# Patient Record
Sex: Female | Born: 1935 | Race: White | Hispanic: No | Smoking: Former smoker
Health system: Southern US, Community
[De-identification: ages and names within clinical notes are randomized; demographics above are authoritative.]

## PROBLEM LIST (undated history)

## (undated) DIAGNOSIS — E0590 Thyrotoxicosis, unspecified without thyrotoxic crisis or storm: Secondary | ICD-10-CM

## (undated) DIAGNOSIS — J45909 Unspecified asthma, uncomplicated: Secondary | ICD-10-CM

## (undated) DIAGNOSIS — I6529 Occlusion and stenosis of unspecified carotid artery: Secondary | ICD-10-CM

## (undated) DIAGNOSIS — I10 Essential (primary) hypertension: Secondary | ICD-10-CM

## (undated) DIAGNOSIS — Z780 Asymptomatic menopausal state: Secondary | ICD-10-CM

## (undated) DIAGNOSIS — M810 Age-related osteoporosis without current pathological fracture: Secondary | ICD-10-CM

## (undated) DIAGNOSIS — I639 Cerebral infarction, unspecified: Secondary | ICD-10-CM

## (undated) DIAGNOSIS — Z973 Presence of spectacles and contact lenses: Secondary | ICD-10-CM

## (undated) DIAGNOSIS — N189 Chronic kidney disease, unspecified: Secondary | ICD-10-CM

## (undated) DIAGNOSIS — E785 Hyperlipidemia, unspecified: Secondary | ICD-10-CM

## (undated) DIAGNOSIS — R519 Headache, unspecified: Secondary | ICD-10-CM

## (undated) DIAGNOSIS — T7840XA Allergy, unspecified, initial encounter: Secondary | ICD-10-CM

## (undated) DIAGNOSIS — J189 Pneumonia, unspecified organism: Secondary | ICD-10-CM

## (undated) DIAGNOSIS — Z974 Presence of external hearing-aid: Secondary | ICD-10-CM

## (undated) DIAGNOSIS — J449 Chronic obstructive pulmonary disease, unspecified: Secondary | ICD-10-CM

## (undated) DIAGNOSIS — M1990 Unspecified osteoarthritis, unspecified site: Secondary | ICD-10-CM

## (undated) DIAGNOSIS — E042 Nontoxic multinodular goiter: Secondary | ICD-10-CM

## (undated) HISTORY — DX: Unspecified asthma, uncomplicated: J45.909

## (undated) HISTORY — PX: WISDOM TOOTH EXTRACTION: SHX21

## (undated) HISTORY — PX: OTHER SURGICAL HISTORY: SHX169

## (undated) HISTORY — DX: Cerebral infarction, unspecified: I63.9

## (undated) HISTORY — DX: Essential (primary) hypertension: I10

## (undated) HISTORY — DX: Allergy, unspecified, initial encounter: T78.40XA

## (undated) HISTORY — PX: BREAST EXCISIONAL BIOPSY: SUR124

## (undated) HISTORY — DX: Asymptomatic menopausal state: Z78.0

## (undated) HISTORY — PX: BACK SURGERY: SHX140

## (undated) HISTORY — PX: CATARACT EXTRACTION W/ INTRAOCULAR LENS  IMPLANT, BILATERAL: SHX1307

## (undated) HISTORY — DX: Age-related osteoporosis without current pathological fracture: M81.0

## (undated) HISTORY — DX: Thyrotoxicosis, unspecified without thyrotoxic crisis or storm: E05.90

## (undated) HISTORY — DX: Hyperlipidemia, unspecified: E78.5

## (undated) HISTORY — DX: Nontoxic multinodular goiter: E04.2

## (undated) HISTORY — PX: NECK SURGERY: SHX720

---

## 1998-03-13 ENCOUNTER — Other Ambulatory Visit: Admission: RE | Admit: 1998-03-13 | Discharge: 1998-03-13 | Payer: Self-pay | Admitting: Obstetrics & Gynecology

## 1998-03-14 ENCOUNTER — Ambulatory Visit (HOSPITAL_COMMUNITY): Admission: RE | Admit: 1998-03-14 | Discharge: 1998-03-14 | Payer: Self-pay | Admitting: *Deleted

## 1999-03-22 ENCOUNTER — Other Ambulatory Visit: Admission: RE | Admit: 1999-03-22 | Discharge: 1999-03-22 | Payer: Self-pay | Admitting: Obstetrics & Gynecology

## 2000-04-09 ENCOUNTER — Encounter: Payer: Self-pay | Admitting: Obstetrics & Gynecology

## 2000-04-09 ENCOUNTER — Encounter: Admission: RE | Admit: 2000-04-09 | Discharge: 2000-04-09 | Payer: Self-pay | Admitting: Obstetrics & Gynecology

## 2000-05-25 ENCOUNTER — Encounter (INDEPENDENT_AMBULATORY_CARE_PROVIDER_SITE_OTHER): Payer: Self-pay | Admitting: Specialist

## 2000-05-25 ENCOUNTER — Inpatient Hospital Stay (HOSPITAL_COMMUNITY): Admission: EM | Admit: 2000-05-25 | Discharge: 2000-05-30 | Payer: Self-pay | Admitting: Emergency Medicine

## 2000-05-25 ENCOUNTER — Encounter: Payer: Self-pay | Admitting: *Deleted

## 2000-05-26 ENCOUNTER — Encounter: Payer: Self-pay | Admitting: Neurology

## 2000-05-26 ENCOUNTER — Encounter: Payer: Self-pay | Admitting: *Deleted

## 2000-05-29 ENCOUNTER — Encounter: Payer: Self-pay | Admitting: *Deleted

## 2000-06-14 ENCOUNTER — Ambulatory Visit (HOSPITAL_COMMUNITY): Admission: RE | Admit: 2000-06-14 | Discharge: 2000-06-14 | Payer: Self-pay | Admitting: Neurology

## 2000-06-14 ENCOUNTER — Encounter: Payer: Self-pay | Admitting: Neurology

## 2000-07-09 ENCOUNTER — Encounter: Payer: Self-pay | Admitting: Vascular Surgery

## 2000-07-10 ENCOUNTER — Encounter (INDEPENDENT_AMBULATORY_CARE_PROVIDER_SITE_OTHER): Payer: Self-pay | Admitting: *Deleted

## 2000-07-10 ENCOUNTER — Inpatient Hospital Stay (HOSPITAL_COMMUNITY): Admission: RE | Admit: 2000-07-10 | Discharge: 2000-07-11 | Payer: Self-pay | Admitting: Vascular Surgery

## 2000-09-24 ENCOUNTER — Emergency Department (HOSPITAL_COMMUNITY): Admission: EM | Admit: 2000-09-24 | Discharge: 2000-09-24 | Payer: Self-pay | Admitting: Emergency Medicine

## 2000-09-24 ENCOUNTER — Encounter: Payer: Self-pay | Admitting: Emergency Medicine

## 2000-10-28 ENCOUNTER — Other Ambulatory Visit: Admission: RE | Admit: 2000-10-28 | Discharge: 2000-10-28 | Payer: Self-pay | Admitting: Orthopedic Surgery

## 2000-10-29 ENCOUNTER — Inpatient Hospital Stay (HOSPITAL_COMMUNITY): Admission: EM | Admit: 2000-10-29 | Discharge: 2000-10-30 | Payer: Self-pay | Admitting: Emergency Medicine

## 2000-10-29 ENCOUNTER — Encounter: Payer: Self-pay | Admitting: Emergency Medicine

## 2001-04-20 ENCOUNTER — Encounter: Payer: Self-pay | Admitting: Internal Medicine

## 2001-04-20 ENCOUNTER — Encounter: Admission: RE | Admit: 2001-04-20 | Discharge: 2001-04-20 | Payer: Self-pay | Admitting: Internal Medicine

## 2001-05-26 ENCOUNTER — Ambulatory Visit (HOSPITAL_COMMUNITY): Admission: RE | Admit: 2001-05-26 | Discharge: 2001-05-26 | Payer: Self-pay | Admitting: Endocrinology

## 2001-05-26 ENCOUNTER — Encounter: Payer: Self-pay | Admitting: Endocrinology

## 2001-09-29 ENCOUNTER — Encounter: Payer: Self-pay | Admitting: Internal Medicine

## 2001-09-29 ENCOUNTER — Encounter: Admission: RE | Admit: 2001-09-29 | Discharge: 2001-09-29 | Payer: Self-pay | Admitting: Internal Medicine

## 2001-11-09 ENCOUNTER — Encounter: Admission: RE | Admit: 2001-11-09 | Discharge: 2001-11-09 | Payer: Self-pay | Admitting: Internal Medicine

## 2001-11-09 ENCOUNTER — Encounter: Payer: Self-pay | Admitting: Internal Medicine

## 2001-11-13 ENCOUNTER — Emergency Department (HOSPITAL_COMMUNITY): Admission: EM | Admit: 2001-11-13 | Discharge: 2001-11-13 | Payer: Self-pay | Admitting: Emergency Medicine

## 2001-11-15 ENCOUNTER — Other Ambulatory Visit (HOSPITAL_COMMUNITY): Admission: RE | Admit: 2001-11-15 | Discharge: 2001-11-17 | Payer: Self-pay | Admitting: Psychiatry

## 2002-04-22 ENCOUNTER — Encounter: Admission: RE | Admit: 2002-04-22 | Discharge: 2002-04-22 | Payer: Self-pay | Admitting: Internal Medicine

## 2002-04-22 ENCOUNTER — Encounter: Payer: Self-pay | Admitting: Internal Medicine

## 2002-11-23 ENCOUNTER — Encounter: Admission: RE | Admit: 2002-11-23 | Discharge: 2002-11-23 | Payer: Self-pay | Admitting: Nephrology

## 2002-11-23 ENCOUNTER — Encounter: Payer: Self-pay | Admitting: Nephrology

## 2002-12-07 ENCOUNTER — Ambulatory Visit (HOSPITAL_COMMUNITY): Admission: RE | Admit: 2002-12-07 | Discharge: 2002-12-07 | Payer: Self-pay | Admitting: Nephrology

## 2002-12-09 ENCOUNTER — Encounter: Payer: Self-pay | Admitting: Nephrology

## 2002-12-09 ENCOUNTER — Ambulatory Visit (HOSPITAL_COMMUNITY): Admission: RE | Admit: 2002-12-09 | Discharge: 2002-12-10 | Payer: Self-pay | Admitting: Nephrology

## 2002-12-09 ENCOUNTER — Encounter (INDEPENDENT_AMBULATORY_CARE_PROVIDER_SITE_OTHER): Payer: Self-pay | Admitting: Specialist

## 2003-05-01 ENCOUNTER — Encounter: Payer: Self-pay | Admitting: Internal Medicine

## 2003-05-01 ENCOUNTER — Encounter: Admission: RE | Admit: 2003-05-01 | Discharge: 2003-05-01 | Payer: Self-pay | Admitting: Internal Medicine

## 2003-06-29 ENCOUNTER — Other Ambulatory Visit: Admission: RE | Admit: 2003-06-29 | Discharge: 2003-06-29 | Payer: Self-pay | Admitting: Internal Medicine

## 2003-08-04 ENCOUNTER — Encounter: Admission: RE | Admit: 2003-08-04 | Discharge: 2003-08-04 | Payer: Self-pay | Admitting: Internal Medicine

## 2004-01-16 ENCOUNTER — Ambulatory Visit (HOSPITAL_COMMUNITY): Admission: RE | Admit: 2004-01-16 | Discharge: 2004-01-16 | Payer: Self-pay | Admitting: Endocrinology

## 2004-05-10 ENCOUNTER — Encounter: Admission: RE | Admit: 2004-05-10 | Discharge: 2004-05-10 | Payer: Self-pay | Admitting: Internal Medicine

## 2004-07-12 ENCOUNTER — Ambulatory Visit (HOSPITAL_COMMUNITY): Admission: RE | Admit: 2004-07-12 | Discharge: 2004-07-12 | Payer: Self-pay | Admitting: Endocrinology

## 2004-10-10 ENCOUNTER — Ambulatory Visit: Payer: Self-pay | Admitting: Endocrinology

## 2004-12-20 ENCOUNTER — Ambulatory Visit: Payer: Self-pay | Admitting: Endocrinology

## 2005-04-14 ENCOUNTER — Ambulatory Visit: Payer: Self-pay | Admitting: Endocrinology

## 2005-05-12 ENCOUNTER — Encounter: Admission: RE | Admit: 2005-05-12 | Discharge: 2005-05-12 | Payer: Self-pay | Admitting: Internal Medicine

## 2005-07-21 ENCOUNTER — Other Ambulatory Visit: Admission: RE | Admit: 2005-07-21 | Discharge: 2005-07-21 | Payer: Self-pay | Admitting: Internal Medicine

## 2005-07-30 ENCOUNTER — Encounter: Admission: RE | Admit: 2005-07-30 | Discharge: 2005-07-30 | Payer: Self-pay | Admitting: Internal Medicine

## 2005-09-15 LAB — HM COLONOSCOPY: HM Colonoscopy: NORMAL

## 2006-01-29 ENCOUNTER — Ambulatory Visit: Payer: Self-pay | Admitting: Endocrinology

## 2006-06-01 ENCOUNTER — Ambulatory Visit: Payer: Self-pay | Admitting: Endocrinology

## 2006-07-06 ENCOUNTER — Encounter: Admission: RE | Admit: 2006-07-06 | Discharge: 2006-07-06 | Payer: Self-pay | Admitting: Internal Medicine

## 2006-11-07 ENCOUNTER — Emergency Department (HOSPITAL_COMMUNITY): Admission: EM | Admit: 2006-11-07 | Discharge: 2006-11-07 | Payer: Self-pay | Admitting: Emergency Medicine

## 2006-11-30 ENCOUNTER — Ambulatory Visit: Payer: Self-pay | Admitting: Endocrinology

## 2006-11-30 LAB — CONVERTED CEMR LAB: TSH: 2.94 microintl units/mL (ref 0.35–5.50)

## 2007-04-30 ENCOUNTER — Encounter: Payer: Self-pay | Admitting: Endocrinology

## 2007-04-30 DIAGNOSIS — M810 Age-related osteoporosis without current pathological fracture: Secondary | ICD-10-CM | POA: Insufficient documentation

## 2007-04-30 DIAGNOSIS — I10 Essential (primary) hypertension: Secondary | ICD-10-CM

## 2007-04-30 HISTORY — DX: Essential (primary) hypertension: I10

## 2007-04-30 HISTORY — DX: Age-related osteoporosis without current pathological fracture: M81.0

## 2007-06-17 ENCOUNTER — Encounter: Payer: Self-pay | Admitting: Endocrinology

## 2007-06-17 ENCOUNTER — Ambulatory Visit: Payer: Self-pay | Admitting: Endocrinology

## 2007-06-17 DIAGNOSIS — E0590 Thyrotoxicosis, unspecified without thyrotoxic crisis or storm: Secondary | ICD-10-CM

## 2007-06-17 HISTORY — DX: Thyrotoxicosis, unspecified without thyrotoxic crisis or storm: E05.90

## 2007-06-17 LAB — CONVERTED CEMR LAB: TSH: 0.5 microintl units/mL (ref 0.35–5.50)

## 2007-08-02 ENCOUNTER — Encounter: Admission: RE | Admit: 2007-08-02 | Discharge: 2007-08-02 | Payer: Self-pay | Admitting: *Deleted

## 2007-12-14 ENCOUNTER — Ambulatory Visit: Payer: Self-pay | Admitting: Endocrinology

## 2007-12-15 LAB — CONVERTED CEMR LAB: TSH: 3.57 microintl units/mL (ref 0.35–5.50)

## 2008-03-21 ENCOUNTER — Observation Stay (HOSPITAL_COMMUNITY): Admission: EM | Admit: 2008-03-21 | Discharge: 2008-03-22 | Payer: Self-pay | Admitting: Emergency Medicine

## 2008-06-13 ENCOUNTER — Ambulatory Visit: Payer: Self-pay | Admitting: Endocrinology

## 2008-06-13 DIAGNOSIS — E876 Hypokalemia: Secondary | ICD-10-CM | POA: Insufficient documentation

## 2008-06-19 LAB — CONVERTED CEMR LAB: TSH: 1.12 microintl units/mL (ref 0.35–5.50)

## 2008-08-21 ENCOUNTER — Encounter: Admission: RE | Admit: 2008-08-21 | Discharge: 2008-08-21 | Payer: Self-pay | Admitting: Family Medicine

## 2008-12-20 ENCOUNTER — Ambulatory Visit: Payer: Self-pay | Admitting: Endocrinology

## 2008-12-21 LAB — CONVERTED CEMR LAB: TSH: 0.74 microintl units/mL (ref 0.35–5.50)

## 2009-04-26 ENCOUNTER — Emergency Department (HOSPITAL_COMMUNITY): Admission: EM | Admit: 2009-04-26 | Discharge: 2009-04-27 | Payer: Self-pay | Admitting: Emergency Medicine

## 2009-05-03 ENCOUNTER — Ambulatory Visit: Payer: Self-pay | Admitting: Pulmonary Disease

## 2009-05-03 DIAGNOSIS — J45909 Unspecified asthma, uncomplicated: Secondary | ICD-10-CM

## 2009-05-03 HISTORY — DX: Unspecified asthma, uncomplicated: J45.909

## 2009-06-12 ENCOUNTER — Telehealth: Payer: Self-pay | Admitting: Pulmonary Disease

## 2009-06-26 ENCOUNTER — Ambulatory Visit: Payer: Self-pay | Admitting: Endocrinology

## 2009-06-27 LAB — CONVERTED CEMR LAB: TSH: 1.53 microintl units/mL (ref 0.35–5.50)

## 2009-08-23 ENCOUNTER — Encounter: Admission: RE | Admit: 2009-08-23 | Discharge: 2009-08-23 | Payer: Self-pay | Admitting: Family Medicine

## 2009-12-25 ENCOUNTER — Ambulatory Visit: Payer: Self-pay | Admitting: Endocrinology

## 2009-12-26 LAB — CONVERTED CEMR LAB: TSH: 3.46 microintl units/mL (ref 0.35–5.50)

## 2010-07-01 ENCOUNTER — Ambulatory Visit: Payer: Self-pay | Admitting: Endocrinology

## 2010-07-01 LAB — CONVERTED CEMR LAB: TSH: 1.2 microintl units/mL (ref 0.35–5.50)

## 2010-08-26 ENCOUNTER — Encounter
Admission: RE | Admit: 2010-08-26 | Discharge: 2010-08-26 | Payer: Self-pay | Source: Home / Self Care | Attending: Family Medicine | Admitting: Family Medicine

## 2010-10-17 NOTE — Assessment & Plan Note (Signed)
Summary: 6 mos f/u $50 / cd--rs'/cd   Vital Signs:  Patient profile:   75 year old female Height:      62 inches (157.48 cm) Weight:      141 pounds (64.09 kg) O2 Sat:      88 % on Room air Temp:     97.1 degrees F (36.17 degrees C) oral Pulse rate:   64 / minute BP sitting:   118 / 64  (left arm) Cuff size:   regular  Vitals Entered By: Josph Macho CMA (June 26, 2009 3:59 PM)  O2 Flow:  Room air CC: follow-up visit/pt states she is no longer taking Enalapril or Flonase/ cf Is Patient Diabetic? No   Primary Ariel Lambert:  Ariel Lambert  CC:  follow-up visit/pt states she is no longer taking Enalapril or Flonase/ cf.  History of Present Illness: pt states she feels well in general, except for doe.  she takes tapazole as rx'ed.  Current Medications (verified): 1)  Toprol Xl 50 Mg  Tb24 (Metoprolol Succinate) .... Take 1 By Mouth Qd 2)  Multivitamins   Tabs (Multiple Vitamin) .... Take 1 By Mouth Qd 3)  Calcium 500 500 Mg  Tabs (Calcium Carbonate) .... Take 1 By Mouth Qd 4)  Klor-Con 8 Meq  Tbcr (Potassium Chloride) .... Take 1 By Mouth Qd 5)  Fosamax 70 Mg  Tabs (Alendronate Sodium) .... Take 1 By Mouth Q Week 6)  Vytorin 10-20 Mg  Tabs (Ezetimibe-Simvastatin) .... Take 1 By Mouth Qd 7)  Aspirin 325 Mg  Tabs (Aspirin) .... Take 1 Tablet By Mouth Once A Day 8)  Enalapril Maleate 10 Mg  Tabs (Enalapril Maleate) .... Take 1 By Mouth Qd 9)  Hydrochlorothiazide 12.5 Mg  Tabs (Hydrochlorothiazide) .... Take 1 By Mouth Qd 10)  Tapazole 10 Mg  Tabs (Methimazole) .... Take 1 By Mouth Qd 11)  Advair Diskus 100-50 Mcg/dose  Misc (Fluticasone-Salmeterol) .... Use Prn 12)  Flonase 50 Mcg/act  Susp (Fluticasone Propionate) .... Use Prn 13)  Micardis .... Take 1/2 Tablet By Mouth Once A Day 14)  B Complex  Tabs (B Complex Vitamins) .... Take 1 Tablet By Mouth Once A Day 15)  Fish Oil .Marland Kitchen.. 1 Tab Daily 16)  Biotin .Marland Kitchen.. 1 Tab Daily  Allergies (verified): 1)  ! Sulfa 2)  ! Codeine 3)   ! Demerol 4)  ! Relafen 5)  ! Naprosyn  Past History:  Past Medical History: Last updated: 06/17/2007 Osteoporosis Menopause Multinodular Goiter Dyslipidemia Idopathic acute G.N. Hypertension Hyperthyroidism  Review of Systems  The patient denies fever.    Physical Exam  General:  normal appearance.   Neck:  thyroid is not much enlarged, if at all. Lungs:  Clear to auscultation bilaterally. Normal respiratory effort.  Additional Exam:  02 sat at rest is 94% FastTSH                   1.53 uIU/m   Impression & Recommendations:  Problem # 1:  HYPERTHYROIDISM (ICD-242.90) well-controlled  Problem # 2:  ASTHMA (ICD-493.90) with desaturation with slight walking, better at rest  Medications Added to Medication List This Visit: 1)  Fish Oil  .Marland Kitchen.. 1 tab daily 2)  Biotin  .Marland Kitchen.. 1 tab daily  Other Orders: TLB-TSH (Thyroid Stimulating Hormone) (84443-TSH) Est. Patient Level III (47829)  Patient Instructions: 1)  same methimazole (10 mg once daily) 2)  Please schedule a follow-up appointment in 6 months. 3)  tests are being ordered for you today.  a few days after the test(s), please call [phone] to hear your test results. 4)  please f/u with dr Vassie Loll   Immunization History:  Influenza Immunization History:    Influenza:  historical (06/09/2009)

## 2010-10-17 NOTE — Assessment & Plan Note (Signed)
   Vital Signs:  Patient Profile:   75 Years Old Female Weight:      140 pounds Temp:     97 degrees F Pulse rate:   72 / minute BP sitting:   112 / 75                 Visit Type:  f/u  Chief Complaint:  thyroid.  History of Present Illness:  here for follow-up of hyperthyroidism.  She is taking 10 mg a day of methimazole, and is feeling well in general.  Current Allergies: ! SULFA ! CODEINE ! DEMEROL ! RELAFEN  Past Medical History:    Reviewed history from 04/30/2007 and no changes required:       Osteoporosis       Menopause       Multinodular Goiter       Dyslipidemia       Idopathic acute G.N.       Hypertension       Hyperthyroidism     Review of Systems  The patient denies fever and weight loss.     Physical Exam  General:     healthy-appearing.   Neck:     no thyromegaly and no thyroid nodules or tenderness.   Neurologic:     no tremor Skin:     turgor normal.   Psych:     Oriented X3, not anxious appearing, and not depressed appearing.      Impression & Recommendations:  Problem # 1:  HYPERTHYROIDISM (ICD-242.90)  Her updated medication list for this problem includes:    Toprol Xl 50 Mg Tb24 (Metoprolol succinate) .Marland Kitchen... Take 1 by mouth qd    Tapazole 10 Mg Tabs (Methimazole) .Marland Kitchen... Take 1 by mouth qd  Labs Reviewed: TSH: 2.94 (11/30/2006)    tsh=0.5 (06/17/07) well-controlled   Complete Medication List: 1)  Toprol Xl 50 Mg Tb24 (Metoprolol succinate) .... Take 1 by mouth qd 2)  Multivitamins Tabs (Multiple vitamin) .... Take 1 by mouth qd 3)  Calcium 500 500 Mg Tabs (Calcium carbonate) .... Take 1 by mouth qd 4)  Klor-con 8 Meq Tbcr (Potassium chloride) .... Take 1 by mouth qd 5)  Fosamax 70 Mg Tabs (Alendronate sodium) .... Take 1 by mouth q week 6)  Vytorin 10-20 Mg Tabs (Ezetimibe-simvastatin) .... Take 1 by mouth qd 7)  Adult Aspirin Low Strength 81 Mg Tbdp (Aspirin) .... Take 1 by mouth qd 8)  Enalapril Maleate 10 Mg Tabs  (Enalapril maleate) .... Take 1 by mouth qd 9)  Hydrochlorothiazide 12.5 Mg Tabs (Hydrochlorothiazide) .... Take 1 by mouth qd 10)  Tapazole 10 Mg Tabs (Methimazole) .... Take 1 by mouth qd 11)  Mavik 4 Mg Tabs (Trandolapril) .... Take 1 by mouth qd 12)  Advair Diskus 100-50 Mcg/dose Misc (Fluticasone-salmeterol) .... Use prn 13)  Flonase 50 Mcg/act Susp (Fluticasone propionate) .... Use prn   Patient Instructions: 1)  same tapazole (10/d) 2)  ret 6 mos    ]

## 2010-10-17 NOTE — Assessment & Plan Note (Signed)
Summary: 6 MTH FU STC   Vital Signs:  Patient profile:   75 year old female Height:      62 inches (157.48 cm) Weight:      139.38 pounds (63.35 kg) BMI:     25.59 O2 Sat:      97 % on Room air Temp:     98.3 degrees F (36.83 degrees C) oral Pulse rate:   57 / minute BP sitting:   114 / 78  (left arm) Cuff size:   regular  Vitals Entered By: Brenton Grills MA (July 01, 2010 8:48 AM)  O2 Flow:  Room air CC: 6 month F/U/aj Is Patient Diabetic? No   Primary Provider:  [name]  CC:  6 month F/U/aj.  History of Present Illness: pt states she feels well in general.  she will travel to [place] soon.  she does not notice any thyroid enlargement.    Current Medications (verified): 1)  Toprol Xl 50 Mg  Tb24 (Metoprolol Succinate) .... Take 1 By Mouth Qd 2)  Multivitamins   Tabs (Multiple Vitamin) .... Take 1 By Mouth Qd 3)  Calcium 500 500 Mg  Tabs (Calcium Carbonate) .... Take 1 By Mouth Qd 4)  Klor-Con 8 Meq  Tbcr (Potassium Chloride) .... Take 1 By Mouth Qd 5)  Fosamax 70 Mg  Tabs (Alendronate Sodium) .... Take 1 By Mouth Q Week 6)  Vytorin 10-20 Mg  Tabs (Ezetimibe-Simvastatin) .... Take 1 By Mouth Qd 7)  Aspirin 325 Mg  Tabs (Aspirin) .... Take 1 Tablet By Mouth Once A Day 8)  Hydrochlorothiazide 12.5 Mg  Tabs (Hydrochlorothiazide) .... Take 1 By Mouth Qd 9)  Tapazole 10 Mg  Tabs (Methimazole) .... Take 1 By Mouth Qd 10)  Advair Diskus 100-50 Mcg/dose  Misc (Fluticasone-Salmeterol) .... Use Prn 11)  Flonase 50 Mcg/act  Susp (Fluticasone Propionate) .... Use Prn 12)  B Complex  Tabs (B Complex Vitamins) .... Take 1 Tablet By Mouth Once A Day 13)  Micardis 40 Mg Tabs (Telmisartan) .... 1/2 Tab Once Daily  Allergies (verified): 1)  ! Sulfa 2)  ! Codeine 3)  ! Demerol 4)  ! Relafen 5)  ! Naprosyn  Past History:  Past Medical History: Last updated: 06/17/2007 Osteoporosis Menopause Multinodular Goiter Dyslipidemia Idopathic acute  G.N. Hypertension Hyperthyroidism  Review of Systems  The patient denies fever.    Physical Exam  General:  normal appearance.   Neck:  thyroid is not much enlarged, if at all.  i do not appreciate a nodule.   Skin:  not diaphoretic Additional Exam:  FastTSH   1.20 uIU/mL      Impression & Recommendations:  Problem # 1:  HYPERTHYROIDISM (ICD-242.90) well-controlled  Other Orders: Flu Vaccine 44yrs + MEDICARE PATIENTS (Z6109) Administration Flu vaccine - MCR (G0008) TLB-TSH (Thyroid Stimulating Hormone) (84443-TSH) Est. Patient Level III (60454)  Patient Instructions: 1)  blood tests are being ordered for you today.  please call [phone] to hear your test results. 2)  Please schedule a follow-up appointment in 6 months. 3)  (update: i left message on phone-tree:  same rx). Prescriptions: TAPAZOLE 10 MG  TABS (METHIMAZOLE) take 1 by mouth qd  #90 Tablet x 0   Entered and Authorized by:   Minus Breeding MD   Signed by:   Minus Breeding MD on 07/01/2010   Method used:   Print then Give to Patient   RxID:   [id number]    Orders Added: 1)  Flu Vaccine  75yrs + MEDICARE PATIENTS [Q2039] 2)  Administration Flu vaccine - MCR [G0008] 3)  TLB-TSH (Thyroid Stimulating Hormone) [84443-TSH] 4)  Est. Patient Level III [98119]   Flu Vaccine Consent Questions     Do you have a history of severe allergic reactions to this vaccine? no    Any prior history of allergic reactions to egg and/or gelatin? no    Do you have a sensitivity to the preservative Thimersol? no    Do you have a past history of Guillan-Barre Syndrome? no    Do you currently have an acute febrile illness? no    Have you ever had a severe reaction to latex? no    Vaccine information given and explained to patient? yes    Are you currently pregnant? no    Lot Number:[id number]   Exp Date:03/15/2011   Site Given  Left Deltoid [id number]

## 2010-10-17 NOTE — Progress Notes (Signed)
Summary: results    Phone Note Call from Patient   Caller: Patient Call For: alva Summary of Call: calling for results of chest xray/ mbw Initial call taken by: Rickard Patience,  June 12, 2009 3:16 PM  Follow-up for Phone Call        pt calling for cxr results from 05-03-2009.  please advise. Arman Filter LPN  June 12, 2009 3:30 PM   Additional Follow-up for Phone Call Additional follow up Details #1::        mild hyperinflation c/w asthma Additional Follow-up by: Comer Locket. Vassie Loll MD,  June 12, 2009 4:26 PM    Additional Follow-up for Phone Call Additional follow up Details #2::    [id number].  Aundra Millet Reynolds LPN  June 12, 2009 4:56 PM   pt advised of CXR results. Carron Curie CMA  June 13, 2009 3:57 PM

## 2010-10-17 NOTE — Assessment & Plan Note (Signed)
Summary: 6 MTH FU-$50-STC   Vital Signs:  Patient Profile:   75 Years Old Female Weight:      136.2 pounds Temp:     97.5 degrees F oral Pulse rate:   51 / minute BP sitting:   116 / 82  (left arm) Cuff size:   regular  Pt. in pain?   no  Vitals Entered By: Orlan Leavens (June 13, 2008 1:22 PM)                  PCP:  [name]  Chief Complaint:  6 month follow-up.  History of Present Illness: feels well on tapazole 10/d.  she has bradycardia, but she doesn't feel tired.    Current Allergies: ! SULFA ! CODEINE ! DEMEROL ! RELAFEN  Past Medical History:    Reviewed history from 06/17/2007 and no changes required:       Osteoporosis       Menopause       Multinodular Goiter       Dyslipidemia       Idopathic acute G.N.       Hypertension       Hyperthyroidism     Review of Systems  The patient denies fever.     Physical Exam  General:     well developed, well nourished, in no acute distress Neck:     thyroid is not much enlarged, if at all. Additional Exam:     FastTSH                   1.12 uIU/mL                 0.35-5.50     Impression & Recommendations:  Problem # 1:  HYPERTHYROIDISM (ICD-242.90) well-controlled  Other Orders: Flu Vaccine 40yrs + (29562) Administration Flu vaccine (Z3086)   Patient Instructions: 1)  same tapazole (10/d) 2)  ret 6 mos   ]   Flu Vaccine Consent Questions     Do you have a history of severe allergic reactions to this vaccine? no    Any prior history of allergic reactions to egg and/or gelatin? no    Do you have a sensitivity to the preservative Thimersol? no    Do you have a past history of Guillan-Barre Syndrome? no    Do you currently have an acute febrile illness? no    Have you ever had a severe reaction to latex? no    Vaccine information given and explained to patient? yes    Are you currently pregnant? no    Lot Number:[id number]   Site Given  Left Deltoid IM

## 2010-10-17 NOTE — Assessment & Plan Note (Signed)
Summary: 6 MOS F/U // CD   Vital Signs:  Patient profile:   75 year old female Height:      62 inches Weight:      137 pounds BMI:     25.15 Temp:     97.5 degrees F oral Pulse rate:   57 / minute BP sitting:   100 / 60  (left arm) Cuff size:   regular  Primary Hedwig Mcfall:  [name]   History of Present Illness: pt takes tapazole 10 mg/day, as rx'ed.  she feels well in general   Current Medications (verified): 1)  Toprol Xl 50 Mg  Tb24 (Metoprolol Succinate) .... Take 1 By Mouth Qd 2)  Multivitamins   Tabs (Multiple Vitamin) .... Take 1 By Mouth Qd 3)  Calcium 500 500 Mg  Tabs (Calcium Carbonate) .... Take 1 By Mouth Qd 4)  Klor-Con 8 Meq  Tbcr (Potassium Chloride) .... Take 1 By Mouth Qd 5)  Fosamax 70 Mg  Tabs (Alendronate Sodium) .... Take 1 By Mouth Q Week 6)  Vytorin 10-20 Mg  Tabs (Ezetimibe-Simvastatin) .... Take 1 By Mouth Qd 7)  Adult Aspirin Low Strength 81 Mg  Tbdp (Aspirin) .... Take 1 By Mouth Qd 8)  Enalapril Maleate 10 Mg  Tabs (Enalapril Maleate) .... Take 1 By Mouth Qd 9)  Hydrochlorothiazide 12.5 Mg  Tabs (Hydrochlorothiazide) .... Take 1 By Mouth Qd 10)  Tapazole 10 Mg  Tabs (Methimazole) .... Take 1 By Mouth Qd 11)  Advair Diskus 100-50 Mcg/dose  Misc (Fluticasone-Salmeterol) .... Use Prn 12)  Flonase 50 Mcg/act  Susp (Fluticasone Propionate) .... Use Prn  Allergies (verified): 1)  ! Sulfa 2)  ! Codeine 3)  ! Demerol 4)  ! Relafen  Past History:  Past Medical History:    Osteoporosis    Menopause    Multinodular Goiter    Dyslipidemia    Idopathic acute G.N.    Hypertension    Hyperthyroidism     (06/17/2007)  Review of Systems  The patient denies fever.    Physical Exam  General:  Well developed, well nourished, in no acute distress.  Neck:  thyroid is not much enlarged, if at all. Additional Exam:  FastTSH                   0.74 uIU/mL     Impression & Recommendations:  Problem # 1:  HYPERTHYROIDISM  (ICD-242.90) well-controlled  Other Orders: TLB-TSH (Thyroid Stimulating Hormone) (84443-TSH) Est. Patient Level III (42595)  Patient Instructions: 1)  continue tapazole 10 mg daily  Preventive Care Screening  Colonoscopy:    Date:  09/15/2005    Results:  normal

## 2010-10-17 NOTE — Assessment & Plan Note (Signed)
Summary: ASTHMA///kp   Visit Type:  Initial Consult Primary Provider/Referring Provider:  [name]  CC:  Pt here for pulmonary consult.  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 She never had asthma as a child, was diagnosed in 1998, negative allergy evaluation, stayed on advair until 2-3 years ago. This year she had 3 exacerbations , first in 5/10, then [place] the beach (son-in law is a doctor & gave her prednisone) & again on 04/27/09 requiring prednisone taper , albuterol nebs.  She denies cough, sinus pressure, nasal stuffiness, recurrent sinusitis, heartburn or any change in her environment. She has had a dog x 8 years.  Spirometry >> mild airway obstruction in smaller airways but near normal. CXR hyperinflation. She smoked 1-1.5 PPd until quitting in 1980 , about 25 Pyrs. Mavik (trandalopril) was stoppe dby Dr Lacy Duverney in 1/10.   Preventive Screening-Counseling & Management  Alcohol-Tobacco     Alcohol drinks/day: <1     Alcohol type: wine     Smoking Status: quit     Packs/Day: 1.5     Year Started: 1954     Year Quit: 1980  Current Medications (verified): 1)  Toprol Xl 50 Mg  Tb24 (Metoprolol Succinate) .... Take 1 By Mouth Qd 2)  Multivitamins   Tabs (Multiple Vitamin) .... Take 1 By Mouth Qd 3)  Calcium 500 500 Mg  Tabs (Calcium Carbonate) .... Take 1 By Mouth Qd 4)  Klor-Con 8 Meq  Tbcr (Potassium Chloride) .... Take 1 By Mouth Qd 5)  Fosamax 70 Mg  Tabs (Alendronate Sodium) .... Take 1 By Mouth Q Week 6)  Vytorin 10-20 Mg  Tabs (Ezetimibe-Simvastatin) .... Take 1 By Mouth Qd 7)  Aspirin 325 Mg  Tabs (Aspirin) .... Take 1 Tablet By Mouth Once A Day 8)  Enalapril Maleate 10 Mg  Tabs (Enalapril Maleate) .... Take 1 By Mouth Qd 9)  Hydrochlorothiazide 12.5 Mg  Tabs (Hydrochlorothiazide) .... Take 1 By Mouth Qd 10)  Tapazole 10 Mg  Tabs (Methimazole) .... Take 1 By Mouth Qd 11)  Advair Diskus 100-50 Mcg/dose   Misc (Fluticasone-Salmeterol) .... Use Prn 12)  Flonase 50 Mcg/act  Susp (Fluticasone Propionate) .... Use Prn 13)  Micardis .... Take 1/2 Tablet By Mouth Once A Day 14)  B Complex  Tabs (B Complex Vitamins) .... Take 1 Tablet By Mouth Once A Day  Allergies: 1)  ! Sulfa 2)  ! Codeine 3)  ! Demerol 4)  ! Relafen 5)  ! Naprosyn  Past History:  Past Medical History: Last updated: 06/17/2007 Osteoporosis Menopause Multinodular Goiter Dyslipidemia Idopathic acute G.N. Hypertension Hyperthyroidism  Social History: Last updated: 05/03/2009 Widowed Runner, broadcasting/film/video, lives alone Patient states former smoker.   Family History: Heart disease  Social History: Widowed Runner, broadcasting/film/video, lives alone Patient states former smoker.  Packs/Day:  1.5 Alcohol drinks/day:  <1  Review of Systems       The patient complains of shortness of breath with activity, weight change, and tooth/dental problems.  The patient denies shortness of breath at rest, productive cough, non-productive cough, coughing up blood, chest pain, irregular heartbeats, acid heartburn, indigestion, loss of appetite, abdominal pain, difficulty swallowing, sore throat, headaches, nasal congestion/difficulty breathing through nose, sneezing, itching, ear ache, anxiety, depression, hand/feet swelling, joint stiffness or pain, rash, change in color of mucus, and fever.    Vital Signs:  Patient profile:   75 year old female Height:      62 inches Weight:  139.38 pounds O2 Sat:      94 % on Room air Temp:     98.2 degrees F oral Pulse rate:   57 / minute BP sitting:   122 / 78  (right arm) Cuff size:   regular  Vitals Entered By: Zackery Barefoot CMA (May 03, 2009 3:08 PM)  O2 Flow:  Room air CC: Pt here for pulmonary consult Comments Medications reviewed with patient Zackery Barefoot CMA  May 03, 2009 3:09 PM    Physical Exam  Additional Exam:  Gen. Pleasant, well-nourished, in no distress, normal affect ENT - no  lesions, no post nasal drip Neck: No JVD, no thyromegaly, no carotid bruits Lungs: no use of accessory muscles, no dullness to percussion, clear without rales or rhonchi  Cardiovascular: Rhythm regular, heart sounds  normal, no murmurs or gallops, no peripheral edema Abdomen: soft and non-tender, no hepatosplenomegaly, BS normal. Musculoskeletal: No deformities, no cyanosis or clubbing Neuro:  alert, non focal     CXR  Procedure date:  05/03/2009  Findings:      Comparison: 03/21/2008   Findings: Lungs mildly hyperinflated, clear.  Heart size normal. Mildly tortuous thoracic aorta.  No effusion.  Visualized bones unremarkable.   IMPRESSION: Mild hyperinflation without acute or superimposed abnormality.  Impression & Recommendations:  Problem # 1:  ASTHMA (ICD-493.90) Unclear cause of worsening in recent months. No clear environmental trigger, no evidence of sinusitis, GERD , allergies. Does not seem to have underlying COPD.Does not seem to be on ACE inhibitor Would ct advair 100/50 x 6 months , then consider stepdown if no flares. Discussed plan in event of exacerbation.  Medications Added to Medication List This Visit: 1)  Aspirin 325 Mg Tabs (Aspirin) .... Take 1 tablet by mouth once a day 2)  Micardis  .... Take 1/2 tablet by mouth once a day 3)  B Complex Tabs (B complex vitamins) .... Take 1 tablet by mouth once a day 4)  Prednisone 10 Mg Tabs (Prednisone) .... 2 tabs once daily x 4 days, then 1 tab once daily x 4 days then stop  Other Orders: T-2 View CXR (71020TC) Consultation Level III (16109) Spirometry w/Graph (60454)  Patient Instructions: 1)  A chest x-ray has been recommended.  Your imaging study may require preauthorization.  2)  Please schedule a follow-up appointment in 3 months. 3)  Stay on advair 100/50 two times a day  4)  Plan for flare : Take rescue inhaler, use albuterol nebuliser, if using this more than two times a day -call us & start using  prednisone (sent to pharmacy to use ONLY in emergency) Prescriptions: PREDNISONE 10 MG TABS (PREDNISONE) 2 tabs once daily x 4 days, then 1 tab once daily x 4 days then stop  #15 x 0   Entered and Authorized by:   Comer Locket Vassie Loll MD   Signed by:   Comer Locket Vassie Loll MD on 05/03/2009   Method used:   Electronically to        [facility]  [phone]* (retail)       [address] [place], [place]  [address]       Ph: [phone] or [phone]       Fax: [phone]   RxID:   [id number]   Appended Document: ASTHMA///kp to Dr Paulino Rily, Deboraha Sprang  Appended Document: ASTHMA///kp biscom faxed to Dr. Paulino Rily. jwr.

## 2010-10-17 NOTE — Assessment & Plan Note (Signed)
Summary: 6 MTH FU--STC   Vital Signs:  Patient profile:   75 year old female Height:      62 inches (157.48 cm) Weight:      131.50 pounds (59.77 kg) BMI:     24.14 O2 Sat:      92 % on Room air Temp:     97.2 degrees F (36.22 degrees C) oral Pulse rate:   59 / minute BP sitting:   110 / 80  (left arm) Cuff size:   regular  Vitals Entered By: Josph Macho RMA (December 25, 2009 2:58 PM)  O2 Flow:  Room air CC: 6 month follow up/ pt states she is no longer taking fish oil or Biotin/ CF   Primary Provider:  [name]  CC:  6 month follow up/ pt states she is no longer taking fish oil or Biotin/ CF.  History of Present Illness: pt has h/o hyperthyroidism, due to a multinodular goiter in 2003.  she takes tapazole 10 mg once daily, as rx'ed.  pt states she feels well in general.    Current Medications (verified): 1)  Toprol Xl 50 Mg  Tb24 (Metoprolol Succinate) .... Take 1 By Mouth Qd 2)  Multivitamins   Tabs (Multiple Vitamin) .... Take 1 By Mouth Qd 3)  Calcium 500 500 Mg  Tabs (Calcium Carbonate) .... Take 1 By Mouth Qd 4)  Klor-Con 8 Meq  Tbcr (Potassium Chloride) .... Take 1 By Mouth Qd 5)  Fosamax 70 Mg  Tabs (Alendronate Sodium) .... Take 1 By Mouth Q Week 6)  Vytorin 10-20 Mg  Tabs (Ezetimibe-Simvastatin) .... Take 1 By Mouth Qd 7)  Aspirin 325 Mg  Tabs (Aspirin) .... Take 1 Tablet By Mouth Once A Day 8)  Hydrochlorothiazide 12.5 Mg  Tabs (Hydrochlorothiazide) .... Take 1 By Mouth Qd 9)  Tapazole 10 Mg  Tabs (Methimazole) .... Take 1 By Mouth Qd 10)  Advair Diskus 100-50 Mcg/dose  Misc (Fluticasone-Salmeterol) .... Use Prn 11)  Flonase 50 Mcg/act  Susp (Fluticasone Propionate) .... Use Prn 12)  Micardis .... Take 1/2 Tablet By Mouth Once A Day 13)  B Complex  Tabs (B Complex Vitamins) .... Take 1 Tablet By Mouth Once A Day 14)  Fish Oil .Marland Kitchen.. 1 Tab Daily 15)  Biotin .Marland Kitchen.. 1 Tab Daily  Allergies (verified): 1)  ! Sulfa 2)  ! Codeine 3)  ! Demerol 4)  ! Relafen 5)   ! Naprosyn  Past History:  Past Medical History: Last updated: 06/17/2007 Osteoporosis Menopause Multinodular Goiter Dyslipidemia Idopathic acute G.N. Hypertension Hyperthyroidism  Review of Systems  The patient denies fever.    Physical Exam  General:  normal appearance.   Neck:  thyroid is not much enlarged, if at all.  i do not appreciate a nodule. Additional Exam:  FastTSH                   3.46 uIU/mL    Impression & Recommendations:  Problem # 1:  HYPERTHYROIDISM (ICD-242.90) well-controlled  Medications Added to Medication List This Visit: 1)  Micardis 40 Mg Tabs (Telmisartan) .... 1/2 tab once daily  Other Orders: TLB-TSH (Thyroid Stimulating Hormone) [id number])  Patient Instructions: 1)  if ever you have fever while taking methimazole, stop it and call us, because of the risk of a rare side-effect 2)  tests are being ordered for you today.  please call [phone] to hear your test results. 3)  pending the test results, please continue the same medications for now  4)  Please schedule a follow-up appointment in 6 months. 5)  (update: i left message on phone-tree:  rx as we discussed)

## 2010-10-17 NOTE — Assessment & Plan Note (Signed)
Summary: 6 MTH FU-STC   Vital Signs:  Patient Profile:   75 Years Old Female Weight:      136.8 pounds Temp:     97.0 degrees F oral Pulse rate:   61 / minute BP sitting:   120 / 79  (right arm) Cuff size:   regular  Vitals Entered ByMarland Kitchen Orlan Leavens (December 14, 2007 2:09 PM)                 Visit Type:  Follow-up Visit  Chief Complaint:  thyroid.  History of Present Illness: takes tapazole 10/d.  feels no different, and well in general.  has lost few lbs, due to her efforts.      Current Allergies: ! SULFA ! CODEINE ! DEMEROL ! RELAFEN  Past Medical History:    Reviewed history from 06/17/2007 and no changes required:       Osteoporosis       Menopause       Multinodular Goiter       Dyslipidemia       Idopathic acute G.N.       Hypertension       Hyperthyroidism     Review of Systems  The patient denies fever.     Physical Exam  General:     normal appearance.   Neck:     thyroid is not much enlarged, if at all. Neurologic:     no tremor Skin:     normal texture and temp.  no rash.  not diaphoretic  Cervical Nodes:     no significant adenopathy Additional Exam:     tsh=3.57    Impression & Recommendations:  Problem # 1:  HYPERTHYROIDISM (ICD-242.90) well-controlled Her updated medication list for this problem includes:    Toprol Xl 50 Mg Tb24 (Metoprolol succinate) .Marland Kitchen... Take 1 by mouth qd    Tapazole 10 Mg Tabs (Methimazole) .Marland Kitchen... Take 1 by mouth qd  Orders: TLB-TSH (Thyroid Stimulating Hormone) (84443-TSH) Est. Patient Level III (40347)    Patient Instructions: 1)  same tapazole 2)  ret 6 mos 3)  cc dr [name]    ]  Appended Document: 6 MTH FU-STC FAXED NOTES TO DR Daphine Deutscher @ [phone]

## 2010-12-30 ENCOUNTER — Encounter: Payer: Self-pay | Admitting: Endocrinology

## 2010-12-30 ENCOUNTER — Other Ambulatory Visit (INDEPENDENT_AMBULATORY_CARE_PROVIDER_SITE_OTHER): Payer: Medicare Other

## 2010-12-30 ENCOUNTER — Ambulatory Visit (INDEPENDENT_AMBULATORY_CARE_PROVIDER_SITE_OTHER): Payer: Medicare Other | Admitting: Endocrinology

## 2010-12-30 VITALS — BP 104/68 | HR 55 | Temp 98.0°F | Ht 63.0 in | Wt 134.1 lb

## 2010-12-30 DIAGNOSIS — E0590 Thyrotoxicosis, unspecified without thyrotoxic crisis or storm: Secondary | ICD-10-CM

## 2010-12-30 DIAGNOSIS — E042 Nontoxic multinodular goiter: Secondary | ICD-10-CM

## 2010-12-30 LAB — TSH: TSH: 1.78 u[IU]/mL (ref 0.35–5.50)

## 2010-12-30 NOTE — Patient Instructions (Addendum)
blood tests are being ordered for you today.  please call [phone] to hear your test results. pending the test results, please continue the same medications for now Please make a follow-up appointment in 6 months. if ever you have fever while taking this medication, stop it and call us, because of the risk of a rare side-effect. (update: i left message on phone-tree:  rx as we discussed)

## 2010-12-30 NOTE — Progress Notes (Signed)
  Subjective:    Patient ID: Ariel Lambert, female    DOB: 06/26/1936, 75 y.o.   MRN: [id number]  HPI Pt returns for f/u of hyperthyroidism, due to a multinodular goiter.  It is chronically controlled with tapazole.  pt states she feels well in general, except for her asthma.   Past Medical History  Diagnosis Date  . OSTEOPOROSIS 04/30/2007  . ASTHMA 05/03/2009  . HYPERTENSION 04/30/2007  . HYPERTHYROIDISM 06/17/2007  . Multinodular goiter   . Menopause   . Dyslipidemia    No past surgical history on file.  reports that she has quit smoking. She does not have any smokeless tobacco history on file. Her alcohol and drug histories not on file. family history includes Heart disease in her other. Allergies  Allergen Reactions  . Codeine   . Meperidine Hcl   . Nabumetone   . Naproxen     REACTION: itching  . Sulfonamide Derivatives     Review of Systems Denies fever    Objective:   Physical Exam GENERAL: no distress Neck:  i do not appreciate a goiter or thyroid nodule.    Lab Results  Component Value Date   TSH 1.78 12/30/2010      Assessment & Plan:  Hyperthyroidism, well-controlled

## 2011-01-28 NOTE — Procedures (Signed)
EEG NUMBER:  [id number].   REFERRING PHYSICIAN:  Marlan Palau, M.D.   HISTORY:  This is a 75 year old woman who is admitted on March 21, 2008,  for transient global amnesia, which resolved.  She had sudden onset of  confusion and memory loss, history of cerebrovascular disease,  hypertension, hyperlipidemia, osteoporosis, asthma, goiter,  hyperthyroidism, and history of SVT.   MEDICATIONS:  1. Aspirin.  2. Toprol.  3. Potassium.  4. Vytorin.  5. Mavik.  6. Os-Cal.  7. Tylenol.  8. Ambien.  9. Ventolin.   This was a routine 17-channel EEG, with one channel devoted to EKG  utilizing international 10/20 lead placement system.  The patient was  described clinically as being awake and drowsy.  Electrographically, she  also appeared to be awake and drowsy.  While awake, the background  consisted of a well organized, well developed, well modulated 10 Hz  alpha activity, which is predominant in the posterior head region and  reactive to eye opening.  No interhemispheric asymmetry is identified,  no definite epileptiform discharges were seen.  Activation procedures  include photic stimulation, which did not produce any significant change  in the background activity.  Hyperventilation was not performed due to  the patient's history of asthma and cough. The EKG monitor reveals a  relatively regular rhythm, with a rate of 78 beats per minute.  There  are times of mild attentuation in the background with decreased  frequency and amplitude corresponding with drowsiness.   CONCLUSION:  Normal EEG in the wakeful and drowsy stage without seizure  activity or focal abnormality seen during the course of today's  recording.  Clinical correlation is recommended.      Catherine A. Orlin Hilding, M.D.  Electronically Signed    ZOX:WRUE  D:  03/22/2008 15:10:49  T:  03/23/2008 03:22:12  Job #:  [id number]

## 2011-01-28 NOTE — Discharge Summary (Signed)
Ariel Lambert, Ariel Lambert       ACCOUNT NO.:  [id number]   MEDICAL RECORD NO.:  [id number]          PATIENT TYPE:  INP   LOCATION:  3734                         FACILITY:  [facility]   PHYSICIAN:  Marlan Palau, M.D.  DATE OF BIRTH:  July 31, 1936   DATE OF ADMISSION:  03/21/2008  DATE OF DISCHARGE:  03/22/2008                               DISCHARGE SUMMARY   ADMISSION DIAGNOSES:  1. New onset of amnesia likely representing transient global amnesia.  2. History of cerebrovascular disease with a left frontal stroke in      the past.  3. Hypertension.  4. Dyslipidemia.   DISCHARGE DIAGNOSES:  1. Probable transient global amnesia.  2. Prior left frontal stroke.  3. Hypertension.  4. Dyslipidemia.   PROCEDURE:  During this admission include;  1. CT of the head.  2. EEG.  3. MRI of the brain.  4. MRI angiogram of the intracranial vessels.   COMPLICATIONS:  For above procedures, none.   HISTORY OF PRESENT ILLNESS:  Ariel Lambert is a 75 year old  right-handed white female born, 1936-09-03, with a history of  hypertension and dyslipidemia.  The patient has history of  cerebrovascular disease with a prior left frontal stroke.  The patient  has had prior bilateral carotid endarterectomies.  The patient was last  known normal at 10:00 a.m. on the day of admission and was on the  telephone with her daughter, seemed to be normal.  The patient drove to  her daughters' house but upon arrival, the patient was noted to be  confused, had no recollection of her events that occurred throughout  that day, more on previous days.  The patient otherwise had a normal  sensorium, was walking and talking normally.  The patient was brought to  the emergency room for further evaluation.  CT scan of the brain showed  an old left frontal stroke, but no acute changes were seen.  The patient  was admitted for evaluation of an acute amnesia syndrome rather.   PAST MEDICAL HISTORY:  1.  Probable transient global amnesia.  2. Left frontal stroke.  3. Bilateral carotid endarterectomies.  4. Hypertension.  5. Dyslipidemia.  6. Osteoporosis.  7. Asthma.  8. Goiter.  9. Hyperthyroidism.  10.History of glomerulonephritis.  11.Supraventricular tachycardia.   MEDICATIONS PRIOR TO ADMISSION:  1. Aspirin 81 mg daily.  2. Metoprolol 50 mg daily.  3. Potassium 10 mEq daily.  4. Vytorin 10/20 daily.  5. Hydrochlorothiazide 12.5 mg daily.  6. Fosamax 70 mg once a week.  7. Mavik 4 mg daily.  8. Calcium plus vitamin D supplementation.   The patient has allergies to CODEINE, DEMEROL, IBUPROFEN, NAPROSYN,  SULFA DRUGS, and RELAFEN.   The patient does not smoke or drinks alcohol on occasion.   Please refer to history and physical dictation summary for social  history, family history, review of systems, and physical examination.   LABORATORY DATA:  White count of 5.2, hemoglobin 13.2, hematocrit 39.5,  MCV of 91.7, and platelets of 215.  INR of 1.0.  Sodium 141, potassium  3.4, chloride of 103, and CO2 of 31.  Glucose of 174 up  from 91 on  admission.  BUN was 11, creatinine 0.79, total bili was 1.1, alkaline  phosphatase is 69, SGOT of 14, SGPT of 15, total protein 7.0, albumin is  3.9, and calcium 9.3.  TSH of 3.44.  Urinalysis reveals specific gravity  of 0.016, pH of 6.0, 15 mg/dL ketones, 0-2 white cells.  Troponin-I of  0.01, CK level of 61, and MB fraction 1.5.  Again, INR is 1.0.  Sed rate  was 15.   HOSPITAL COURSE:  This patient has done fairly well during the course of  hospitalization.  The patient was admitted to the [facility]  for evaluation of amnesia.  The patient was unable to recall three words  at 1 minute and could not recall being asked to remember three words at  1 minute.  The patient, otherwise, had a normal sensorium and normal  examination.  The patient underwent an MRI scan of the brain that showed  no acute changes.  A remote left  middle cerebral artery distribution  stroke was seen.  The patient had an intracranial MRI angiogram that was  unremarkable.  The patient has been set up for an EEG study, which is  not yet been done.  Plans are to keep this patient on aspirin increasing  to 325 mg daily and will plan discharge following the EEG study.  If EEG  appears to be irritable, may consider use of medication such as Keppra.  The patient will follow up with [facility] in 4-6  weeks following discharge.   DISCHARGE MEDICATIONS:  Identical to the admission medications with  exception that the aspirin dosage has been increased as above.   The patient has had normalization of memory, is not able to recall  events that occurred on the day of admission but is able to recall 2 or  3 words at 5 minutes and recalls the physicians' name at 5 minutes.  Again has a normal  neurologic examination with full visual fields, normal strength with  finger-nose-finger and total finger bilaterally.  Deep tendon reflexes  are symmetric.  The light touch sensation is normal in all fours and the  face.  Again, the patient will follow with [facility] following discharge.      Marlan Palau, M.D.  Electronically Signed     CKW/MEDQ  D:  03/22/2008  T:  03/22/2008  Job:  [id number]   cc:   Emeterio Reeve, MD  [facility]

## 2011-01-28 NOTE — H&P (Signed)
NAMESINEAD, HOCKMAN NO.:  [id number]   MEDICAL RECORD NO.:  [id number]          PATIENT TYPE:  INP   LOCATION:  3734                         FACILITY:  [facility]   PHYSICIAN:  Marlan Palau, M.D.  DATE OF BIRTH:  [birth year]   DATE OF ADMISSION:  03/21/2008  DATE OF DISCHARGE:                              HISTORY & PHYSICAL   HISTORY OF PRESENT ILLNESS:  [name] is a 75 year old right-  handed white female, born November 16, 1935, with a history of  hypertension and dyslipidemia, who has a history of cerebrovascular  disease, prior left frontal stroke, bilateral carotid endarterectomies.  This patient comes in today last known normal at 10 a.m. during a  conversation by telephone with her daughter where she seemed to be  normal.  The patient apparently drove to her daughter's house, but upon  arrival, she seemed to be confused, but had no recollection for events  that occurred during that day or in the days previous.  The patient  otherwise had normal sensorium, was walking about normally, moving the  arms and legs normally, did complain of feeling tingly all over  particularly in the arms.  The patient was brought to the emergency  room.  Code stroke was called.  Neurology who has seen this patient has  a code stroke.  CT scan of the brain shows an old left frontal stroke.  No acute changes noted otherwise.  The patient again appears to be  normal with sensorium, strength, and sensation at this time.  The  patient does complain of a mild headache.  Severe short-term memory  deficits noted.  Unable to remember three words for 1 minute.  The  patient does not recall being asked to remember three words.  The  patient is being admitted for an evaluation of presumed transient global  amnesia.   PAST MEDICAL HISTORY:  Significant for;  1. New-onset of probable transient global amnesia.  2. History of left frontal stroke.  3. Bilateral carotid  endarterectomies.  4. Hypertension.  5. Dyslipidemia.  6. Osteoporosis.  7. Asthma.  8. Goiter.  9. Hyperthyroidism.  10.History of glomerulonephritis.  11.History of supraventricular tachycardia.   MEDICATIONS:  Include;  1. Aspirin 81 mg daily.  2. Metoprolol 50 mg daily.  3. Potassium 10 mEq daily.  4. Vytorin 10/20 daily.  5. Hydrochlorothiazide 12.5 mg daily.  6. Fosamax 70 mg a week.  7. Mavik 4 mg daily.  8. Calcium plus vitamin D supplementation.   ALLERGIES:  The patient has allergies to CODEINE, DEMEROL, IBUPROFEN,  NAPROSYN, SULFA DRUGS, and RELAFEN.   The patient does not smoke, drinks alcohol on occasions.   SOCIAL HISTORY:  The patient is widowed[place], has three children who are in good health.   FAMILY MEDICAL HISTORY:  That mother died with heart disease.  Father  died with cancer.  Patient has two sisters who are alive and well.   REVIEW OF SYSTEMS:  Notable for no recent fevers or chills.  The patient  does note slight posterior headache, some neck stiffness.  Denies  shortness of  breath but has frequent coughing today.  Denies chest pain,  abdominal pain, nausea, vomiting, troubles controlling the bowels or  bladder.  Denies any focal numbness or weakness on the arms or legs, or  dizziness.   PHYSICAL EXAMINATION:  VITALS:  Blood pressure is 179/91, respiratory  rate 18, heart rate 86, temperature afebrile.  GENERAL:  This patient is a fairly well-developed elderly white female  who is alert and cooperative at the time of examination.  HEENT:  Head is atraumatic.  EYES:  Pupils are round and reactive to light.  Disks are flat  bilaterally.  NECK:  Supple.  No carotid bruits noted.  RESPIRATORY:  Clear.  CARDIOVASCULAR:  Regular rate and rhythm.  No obvious murmurs or rubs  are noted.  ABDOMEN:  Abdomen reveals positive bowel sounds.  No organomegaly or  tenderness noted.  EXTREMITIES:  Without significant  edema.  NEUROLOGIC:  Cranial nerves as above.  Facial symmetry is present.  The  patient has good sensation of face to pinprick, soft touch bilaterally.  He has good strength to facial muscles, muscles to head turn and  shoulder shrug bilaterally.  Speech is well enunciated not aphasic.  Extraocular movements are full.  Visual fields are full.  Motor testing  reveals 5/5 strength in all fours.  Good symmetric motor tone is noted  throughout.  The patient has good finger-nose-finger and heel-to-shin  bilaterally.  Gait was not tested.  No drift was seen in the upper and  lower extremities.  Deep tendon reflexes were depressed but symmetric.  Toes are neutral bilaterally.  The patient has good pinprick and soft  touch sensation throughout.   The patient is able recall 0 of 3 words at 5 minutes and 1 minute, is  however able to spell the word world backwards.  He does serial 7s.  He  is able to state her name, place, knows the month, not sure of actual  age, however.  The patient knows her daughter and son-in-law.   CT scan of the head is as above.   IMPRESSION:  1. New onset of significant memory deficit, probable transient global      amnesia.  2. Cerebrovascular disease with left frontal stroke.  3. Hypertension.  4. Dyslipidemia.   This patient has multiple risk factors for cerebrovascular disease, but  the current clinical presentation is most consistent with transient  global amnesia.  Stroke is still in the differential, but need also to  rule out seizure event.  The patient had a prior old left frontal  stroke.  This could be a seizure focus, need to rule out migraine.  The  patient does complain of headache.  The patient will be admitted for  further evaluation.   PLAN:  1. Continue aspirin therapy.  2. The patient to 3000 units monitor bed.  3. MRI of the brain.  4. MRI angiogram.  5. EEG study.  We will follow the patient's clinical course while in-       house.      Marlan Palau, M.D.  Electronically Signed     CKW/MEDQ  D:  03/21/2008  T:  03/22/2008  Job:  [id number]   cc:   Emeterio Reeve, MD

## 2011-01-31 NOTE — Discharge Summary (Signed)
[place]  Patient:    Ariel Lambert, Ariel Lambert              MRN: [id number] Adm. Date:  [id number] Disc. Date: 05/28/00 Attending:  Lum Lambert Dictator:   Ariel Lambert, P.A. CC:         Ariel Lambert, M.D.  Ariel Lambert, M.D.   Discharge Summary  NEUROLOGIST:  Ariel Lambert, M.D.  PRIMARY CARE PHYSICIAN:  Ariel Lambert, M.D.  FINAL DIAGNOSES: 1. Extracranial cerebrovascular occlusive disease. 2. Severe bilateral internal carotid artery stenosis with left internal    carotid artery stenosis 90%, right internal carotid artery stenosis 90%. 3. Left posterior parietal infarct on admission without residual deficits.  SECONDARY DIAGNOSES: 1. Hypertension. 2. Remote history of tobacco habituation. 3. Chronic lumbar back pain status post surgery for left lumbar radiculopathy. 4. Selective estrogen receptor modulator therapy.  PROCEDURE:  On May 27, 2000, left carotid endarterectomy with Dacron patch angioplasty and intraoperative shunting, Dr. Quita Lambert. Investment banker, operational. The patient tolerated the procedure well, and was transferred in stable and satisfactory condition to recovery room.  She awoke there stable with baseline neurologic function.  She was moving upper and lower extremities appropriately.  Her mental status was clear.  Her speech was not garbled.  Her tongue and uvula were midline.  Her swallow was without impairment.  She did not complain of headache.  DISCHARGE DISPOSITION:  Ariel Lambert is judged a suitable candidate for discharge on postoperative day #1, May 28, 2000.  She has maintained the baseline neurologic status as described in the immediate postoperative period. She has not had any lasting deficits from her left posterior parietal infarct which was noted on admission.  She is moving upper and lower extremities appropriately.  Mental status is clear.  She is afebrile.  Her incision is healing well without  evidence of swelling, erythema, or drainage.  She is taking oral nourishment well and tolerating it.  She is ambulating independently.  Her pain is controlled with oral analgesia.  She is judged a suitable candidate for discharge on May 28, 2000.  DISCHARGE MEDICATIONS: 1. Enteric aspirin 325 mg daily. 2. Plavix 75 mg q.d. 3. Percocet 5/325 one or two tabs p.o. q.6h. p.r.n. pain. 4. Toprol XL 50 mg daily. 5. Evista 60 mg daily. 6. Vioxx 25 mg daily. 7. Norvasc 5 mg daily.  DISCHARGE ACTIVITY:  Ambulate as tolerated.  She is asked not to drive for the next two weeks.  DISCHARGE DIET:  Low sodium, low cholesterol.  WOUND CARE:  She may shower daily beginning Friday, May 29, 2000.  She is asked to keep her incision clean and dry.  FOLLOWUP:  She has a follow-up visit with Ariel Lambert on Friday, June 12, 2000, at 11 oclock in the morning.  BRIEF HISTORY:  Ariel Lambert is a 75 year old female who has had a series of transient ischemic attacks which manifested right hand numbness and aphasia over the past 48 hours prior to her admission on May 25, 2000.  All of these experiences have cleared within 30 minutes, and have totaled three.  The third episode occurring on the morning of May 25, 2000.  She was admitted at [facility].  Workup at the hospital included carotid duplex ultrasound which demonstrated severe bilateral carotid artery disease.  Her only other past medical history has been hypertension, which was diagnosed 10 years ago.  She is a patient of Dr. Lum Lambert, as primary care.  She has no history of prior cerebrovascular accident, diabetes, or myocardial infarction.  She has not had with her current symptoms of right-handed numbness and aphasia any recent history of amaurosis fugax, syncope, chest pain, or palpitations.  HOSPITAL COURSE:  After being admitted to [facility] on sept 10, 2001,  and after undergoing carotid duplex ultrasound after carotid bruits were auscultated on primary examination, she was started on aspirin.  A CT scan was obtained which demonstrated left posterior parietal infarct with no evidence of hemorrhage or mass effect.  She was also started on heparin per protocol, and she was seen by Dr. Josephina Lambert of [facility] on May 25, 2000, her date of admission.  He agreed that revascularization surgery would be in her best interest, describing the risks and benefits to Mrs. Lambert, and she elected to undergo the procedure.  She remained stable until the procedure was done on May 27, 2000, and she remained on heparin until the procedure was performed.  The procedure was done without complication.  The patient awoke with baseline neurologic status in the recovery room, and has been maintained at baseline neurologic status for her entire postoperative period, which has included no evidence of fever, incision which is healing nicely.  She has experienced no respiratory compromise or cardiac dysrhythmias.  She is judged a suitable candidate for discharge on May 28, 2000, postoperative day #1. DD:  05/27/00 TD:  05/29/00 Job: [id number]

## 2011-01-31 NOTE — Consult Note (Signed)
[place]. [facility]  Patient:    Ariel Lambert, Ariel Lambert              MRN: [id number] Proc. Date: 05/25/00 Adm. Date:  [id number] Attending:  Lum Babe                          Consultation Report  DATE OF BIRTH:  1936-07-26.  REASON FOR CONSULTATION:  This 75 year old right-handed white married female from [place], [place], has a 10-year history of hypertension and is seen by Dr. Lum Babe today and admitted for evaluation of right hand numbness and loud left carotid bruit.  HISTORY OF PRESENT ILLNESS:  Ms. Fritcher has a known history of hypertension, as mentioned.  She has never had a known history of stroke, diabetes mellitus, or myocardial infarction.  On May 23, 2000, she noted some right-hand numbness, which recurred on May 24, 2000, and then noted problems writing yesterday and yesterday and today has had some speech difficulties without associated headache, amaurosis fugax, syncope, chest pain, or palpitations.  MEDICATIONS:  Toprol XL, Evista, Vioxx, another unknown medication.  She does not use aspirin.  SOCIAL HISTORY:  She apparently has been a cigarette smoker in the past but quit many years ago.  She drinks only two to three drinks of alcohol per month.  PAST MEDICAL HISTORY:  Significant for lower back pain, and she underwent surgery by Dr. Jonne Ply for a left lumbar radiculopathy.  PHYSICAL EXAMINATION:  GENERAL:  A well-developed white female, who is sitting in the hall on a stretcher.  VITAL SIGNS:  Blood pressure right and left arm was 140/80 and 130/80, heart rate was 64 and regular.  NECK:  There was a left loud carotid bruit greater than a left supraclavicular bruit.  NEUROLOGIC:  Mental status:  She was alert and oriented x 3, followed one, two, and three-step commands.  There was no aphasia, but she was tearful.  Her cranial nerve examination revealed visual fields full,  discs flat, extraocular movements full, corneals present, no seventh nerve palsy present, hearing intact, air conduction greater than bone conduction, tongue midline, uvula midline, gags present.  Sternocleidomastoid and trapezius testing normal. Motor examination was 5/5 strength in the upper and lower extremities. Coordinating testing with finger-to-nose, heel-to-shin, and rapid alternating movements _____ normal.  Sensory examination intact to pinprick testing, position, and vibration.  Deep tendon reflexes 2+, plantar downgoing.  IMPRESSION: 1. Right hand numbness, code 702.0. 2. Aphasia, code 784.3. 3. Left carotid bruit and left and right internal carotid artery stenosis by    Doppler studies, code 433.10. 4. Hypertension, code 796.2. 5. History of left lumbar radiculopathy with old low back pain, code _____.  PLAN:  Give her an aspirin, CT scan stat, place her on heparin protocol, and give her saline bolus.  The heparin will be given depending on the results of the CT scan.  She will most likely need a surgical consult. DD:  05/25/00 TD:  05/26/00 Job: [id number]

## 2011-01-31 NOTE — Discharge Summary (Signed)
[place]. [facility]  Patient:    Ariel Lambert, Ariel Lambert              MRN: [id number] Adm. Date:  [id number] Disc. Date: [id number] Attending:  Lum Babe Dictator:   Loura Pardon, P.A.                           Discharge Summary  ADDENDUM  DATE OF BIRTH:  [birth year]  Basically on postoperative day #1 she had headache, was prescribed Excedrin. In the afternoon she was scheduled to go home, however, the discharge was placed on hold secondary to intermittent numbness in the left arm.  She was seen by Dr. Arbie Cookey, who concurred with holding her discharge.  White blood cell count on May 28, 2000 was 7.2 in the morning.  In the morning of May 29, 2000, she awoke with headache and nausea which did moderate during the day.  She says that the numbness in the left arm has now moderated as well at 1900 hours on May 29, 2000.  She was achieving 93% oxygen saturation on room air.  However, in mid morning her temperature spiked to 101.3, and moderated, and then spiked again in the early afternoon. Urinalysis was obtained.  It was negative.  A chest x-ray was clear.  A complete blood count was also obtained.  It showed a mild increase in white cells from 7.2 to 9.  She will have a repeat complete blood count on May 30, 2000.  She seems to be exquisitely sensitive to narcotics and sedating medications.  Even the Phenergan given for nausea was very sedating.  She will be started on Ultram for pain.  Percocet will be discontinued, Phenergan will also be discontinued, and she will be given Zofran for any further nausea, although at the present time she is not complaining of nausea.  DISCHARGE MEDICATIONS: 1. As mentioned before. 2. Lasix 40 mg daily. 3. Potassium chloride 20 mEq daily. 4. In addition, her Restoril for insomnia at night was also placed on hold,    and she will be given Ambien to be dispensed as written 5 mg at bedtime as  needed for nausea. 5. Meanwhile, she continues with good blood pressure control on Toprol and    Norvasc.  As mentioned above, all narcotic based medications are placed on hold, and she has been started on Lasix and potassium chloride. DD:  05/29/00 TD:  06/01/00 Job: [id number]

## 2011-01-31 NOTE — Op Note (Signed)
[place]. [facility]  Patient:    Ariel Lambert, Ariel Lambert              MRN: [id number] Proc. Date: 07/10/00 Adm. Date:  [id number] Disc. Date: [id number] Attending:  Colvin Caroli CC:         Lum Babe, M.D.   Operative Report  PREOPERATIVE DIAGNOSIS:  Right internal carotid artery stenosis, ______ traumatic.  POSTOPERATIVE DIAGNOSIS:  Right internal carotid artery stenosis, ______ traumatic.  OPERATION:  Right carotid endarterectomy with Dacron patch angioplasty.  SURGEON:  Quita Skye. Hart Rochester, M.D.  FIRST ASSISTANT:  Lissa Hoard, P.A.  ANESTHESIA:  General endotracheal.  BRIEF HISTORY:  This patient was recently found to have severe bilateral internal carotid stenoses after suffering a left brain CVA, and she underwent a left carotid endarterectomy about 4 weeks ago.  She has had good return of function with her slow speech and impaired writing skills, and now is essentially neurologically intact.  She returns to have a right carotid endarterectomy performed.  DESCRIPTION OF PROCEDURE:  The patient was taken to the operating room, placed in the supine position, at which time satisfactory general endotracheal anesthesia was administered.  The right neck was prepped with Betadine scrubbing solution, draped in routine sterile manner.  Incision was made along the anterior border of the sternocleidomastoid muscle and carried down through the subcutaneous tissue and platysma using the Bovie.  The common facial vein and external jugular veins were ligated with 3-0 silk ties and divided, exposing the common, internal, and external carotid arteries.  Care was taken not to injure the vagus or hypoglossal nerves, both of which were exposed. There was a non-calcified atherosclerotic plaque at the carotid bifurcation extending up about 3 cm, distal vessel appeared normal and had a slightly diminished pulse.  A #10 shunt was prepared.  The patient was  heparinized. The carotid vessels were occluded with vascular clamps.  Longitudinal opening made in the common carotid with 15 blade, extended up the internal carotid with the Potts scissors to a point distal to the disease.  The plaque was very focal, and was about 90% stenotic in severity.  Distal vessel appeared normal with good back bleeding.  A #10 shunt was inserted without difficulty, reestablishing flow in about 2 minutes.  A standard endarterectomy was performed using an elevator and the Potts scissors with an eversion endarterectomy of the external carotid.  The plaque feathered off the distal internal carotid artery nicely, not requiring any tacking sutures.  The lumen was thoroughly irrigated with heparin and saline, and all instruments were carefully removed, and the arteriotomy was closed with a patch using continuous 6-0 Prolene.  Prior to completion of the closure the shunt was removed after about 30 minutes of shunt time.  Following antegrade and retrograde flushing the closure was completed with reestablishment of the flow initially up the external and up the internal branch.  The carotid was occluded for less than 2 minutes for removal of shunt.  Protamine was then given to reverse the heparin.  Following adequate hemostasis the wound was irrigated with saline, closed in layers with Vicryl in subcuticular fashion, sterile dressing applied.  The patient was taken to the recovery room in satisfactory condition.DD:  07/10/00 TD:  07/10/00 Job: [id number]

## 2011-01-31 NOTE — H&P (Signed)
[place]. [facility]  Patient:    Ariel Lambert, Ariel Lambert              MRN: [id number] Adm. Date:  [id number] Attending:  Kae Heller                         History and Physical  DATE OF BIRTH:  03-15-1936  CHIEF COMPLAINT:  Weakness of the left face, drowsiness, slurred speech.  HISTORY OF PRESENT ILLNESS:  Ariel Lambert is a 75 year old female who has a history of TIAs, CVA, and carotid endarterectomy in the fall of 2001.  She presents today after having had foot surgery yesterday by Dr. Darrelyn Hillock. She was taking Demerol and Phenergan yesterday as well as repeat dose about 11 oclock this morning.  At 4 oclock yesterday she became drowsy and had some slurred speech.  This worsened throughout the evening and at 82 oclock her daughter held her pain medicines.  This morning when she awoke, she was alert, but confused.  She dressed herself and was ready to go to the hospital, although, there was no need to go.  Apparently, she was redosed again with pain medicine about 11 oclock today and was seen by her husbands Hospice nurse and was told that she had pinpoint pupils and had a left facial droop and slurred speech, and the decision was made to bring her to the emergency department by EMS.  She was given Narcan by EMS and did have some relief of her symptomatology.  REVIEW OF SYSTEMS:  She denies any chest pain, palpitations, shortness of breath, no chronic coughing, no orthopnea.  No nausea, vomiting, diarrhea, change of bowels, blood in stool, no urinary symptoms, no new musculoskeletal symptoms.  No significant weight gain or weight loss.  No nightsweats, fevers, or chills.  No recent depression, anxiety, or sleep disturbance.  PAST MEDICAL HISTORY: 1. Hypertension. 2. CVA on the left, September of 2001.  Also had bilateral carotid    endarterectomies on September of 2001 and October of 2001. 3. History of SVTs and  PACs.  MEDICATIONS: 1. Lasix 20 mg p.o. q.d. 2. KCL 10 mEq p.o. q.d. 3. Norvasc 5 mg daily. 4. Toprol XL 50 mg daily. 5. Cephalexin 500 mg p.o. q.i.d. postoperatively. 6. Demerol 50 mg p.o. q.4h. 7. Phenergan 25 mg p.o. q.4h.  ALLERGIES:  CODEINE, SULFA, RELAFEN, FELDENE, NAPROSYN, but can tolerate aspirin.  SOCIAL HISTORY:  The patient lives with her husband who is under Hospice care for lung cancer.  She quit smoking 11 years ago.  Seldom drinks alcohol.  FAMILY HISTORY:  Father died at 61.  He had a history of hypertension and stroke.  Mother died at 61 from CVA and also breast cancer.  PHYSICAL EXAMINATION:  VITAL SIGNS:  Blood pressure 112/71, pulse 72, respiratory rate 16, 95% on room air, and afebrile.  HEENT:  Tympanic membranes are normal.  Pupils are equal, round and reactive to light.  Extraocular movements are intact.  Nasal mucosa and oral mucosa are within normal limits.  NECK:  Supple.  No adenopathy, no JVD, and no bruits.  LUNGS:  Clear to auscultation bilaterally.  HEART:  Regular rate and rhythm without murmurs, rubs, or gallops.  ABDOMEN:  Soft, nontender, nondistended, positive bowel sounds.  No hepatosplenomegaly.  EXTREMITIES:  No clubbing, cyanosis, or edema.  NEUROLOGICAL:  Slight left facial drooping, but no obvious weakness, able to move all musculature.  Cranial nerves II-XII  intact.  Muscle strength was 5/5. She had some slight asymmetry of movements, particularly on the right.  LABORATORY DATA:  ABG; pH 7.393, pCO2 51.3, pO2 31.0, most likely venous. Pulse oximetry is within normal limits.  CBC; white count 5.7, hemoglobin 11.5, platelet count 209.  Electrolytes are normal.  Sodium and potassium within normal limits at 138 and 4.3.  Glucose normal at 82.  BUN 17.  CK and troponins are within normal limits.  Chest x-ray unremarkable.  EKG Unremarkable.  ASSESSMENT: 19. A 75 year old female with history of cerebrovascular disease, status  post    CVA and CEA in September of 2001.  She presented with confusion plus mild    facial droop.  She had a neurologic evaluation and felt to be most likely    toxicity secondary to her pain medications she has been on.  We will    continue to hold her pain medications and will do an MRI this evening in    the remote chance that this is a CVA. 2. Recent right foot surgery.  Continue without significant change, although    we will give her Tylenol or Toradol as needed for pain relief this evening. DD:  10/29/00 TD:  10/30/00 Job: [id number]

## 2011-01-31 NOTE — Consult Note (Signed)
[facility]                          ENDOCRINOLOGY CONSULTATION   NAME:Ariel Lambert, Ariel Lambert              MRN:          [id number]  DATE:11/30/2006                            DOB:          1936/03/25    REASON FOR VISIT:  Followup thyroid.   HISTORY OF PRESENT ILLNESS:  The patient takes Tapazole 10 mg a day for  her hyperthyroidism, and feels well. She has lost a few pounds recently.   PAST MEDICAL HISTORY:  1. Menopause.  2. Multinodular goiter.  3. Hypertension.  4. Dyslipidemia.  5. Osteoporosis.  6. Acute glomerulonephritis.   REVIEW OF SYSTEMS:  Denies fever.   PHYSICAL EXAMINATION:  VITAL SIGNS:  Blood pressure 110/70, heart rate  57, temperature 97.0, the weight is 138.  GENERAL:  No distress.  THYROID:  I do not appreciate a goiter.   LABORATORY DATA:  TSH is normal at 2.94.   IMPRESSION:  Well-controlled hyperthyroidism, which is due to a  multinodular goiter.   PLAN:  1. Same Tapazole.  2. Return in 6 months.     Sean A. Everardo All, MD  Electronically Signed    SAE/MedQ  DD: 12/01/2006  DT: 12/02/2006  Job #: [id number]   cc:   Darius Bump, M.D.  Cecille Aver, M.D.

## 2011-01-31 NOTE — H&P (Signed)
[place]. [facility]  Patient:    Ariel Lambert, Ariel Lambert              MRN: [id number] Adm. Date:  [id number] Attending:  Kae Heller                         History and Physical  DATE OF BIRTH: 1936/02/08  CHIEF COMPLAINT:  Weakness of the left face, drowsy and slurred speech.  HISTORY OF PRESENT ILLNESS:  [name] is a 75 year old female who has a history of transient ischemic attacks, cerebrovascular accident and carotid endarterectomy in the fall of 2001.  She presents after having had foot surgery yesterday by Dr. Darrelyn Hillock.  She was taking Demerol and Phenergan yesterday as well as repeat dose about 11 oclock this morning.  At 4 oclock yesterday, she became drowsy and had some slurred speech.  This worsened throughout the evening.  At 22 oclock her daughter held her pain medications.  This morning when she awoke, she was alert but confused.  She dressed herself and was ready to go to the hospital although there was no need to go. Apparently she was redosed again with pain medicine by 11 oclock today and was seen by her husbands hospice nurse.  She was told that she had pinpoint pupils and facial droop and slurred speech.  The decision was made to bring her to the emergency department by EMS.  She was given Narcan by EMS and did have some relief of her symptomatology.  REVIEW OF SYSTEMS:  She denies chest pain or palpitations, shortness of breath, no chronic coughing, no orthopnea.  No nausea, vomiting, diarrhea, change of bowels, blood in the stool, no urinary symptoms, no new musculoskeletal symptoms, no significant weight gain or weight loss, no night sweats, fevers, chills, no recent depression, anxiety or sleep disturbance.  PAST MEDICAL HISTORY: 1. Hypertension. 2. Cerebrovascular accident on the left on 9/01. 3. Bilateral carotid endarterectomies September and October 2001. 4. History of SVTs and PACs.  CURRENT  MEDICATIONS: 1. Lasix 20 mg p.o. q.d. 2. Kay Ciel 10 mEq p.o. q.d. 3. Norvasc 5 mg daily. 4. Toprol XL 15 mg daily. 5. Cephalexin 500 mg p.o. q.i.d. postoperatively. 6. Demerol 50 mg p.o. q4 hours. 7. Phenergan 25 mg p.o. q4 hours.  ALLERGIES:  CODEINE, SULFA, RELAFEN, FELDENE, NAPROSYN.  She can tolerate aspirin.  SOCIAL HISTORY:  The patient lives with her husband who is under hospice care for lung cancer.  She quit smoking 11 years ago.  Seldom drinks alcohol.  FAMILY HISTORY:  Father died at age 72, history of hypertension and strokes. The mother died age 32 from cerebrovascular accident as well as breast cancer.  PHYSICAL EXAMINATION:  VITAL SIGNS:  Blood pressure 112/71, pulse 72, respiratory rate 16.  She is 95% on room air.  She is afebrile.  HEENT:  Tympanic membranes are normal.  Pupils are equal, round and reactive to light.  Extraocular movements are intact.  Nasal and oral mucosa were within normal limits.  NECK:  Supple.  No nausea, jugular venous distension and no bruits.  LUNGS:  Clear to auscultation bilaterally.  HEART:  Regular rate and rhythm without murmurs, rubs, or gallops.  ABDOMEN:  Soft, nontender, nondistended, positive bowel sounds.  No hepatosplenomegaly.  EXTREMITIES:  No clubbing, cyanosis, or edema.  NEUROLOGIC:  Slight left facial drooping but no obvious weakness in musculature.  Cranial nerves 2-12 are intact.  Muscle strength  is 5/5.  She had some positive symmetry of movements particularly on the right.  LABORATORY:  ABG-pH 7.393, pCO2 51.3 and pO2 of 31.0.  This is most likely venous.  There is pulse oximetry within normal limits.  CBC white count 5.7, hemoglobin 11.5, platelet count 209.  Electrolytes are normal.  Sodium and potassium are within normal limits at 138 and 4.3.  Glucose normal at 82.  BUN is 17.  CK and troponins within normal limits.  Chest x-ray and EKG unremarkable.  ASSESSMENT AND PLAN: 1. This is a 75 year old  female with a history of cerebrovascular disease  status post cerebrovascular accident and a CEA in September 2001.  She    presents with confusion and mild facial droop.  She has neurological    evaluation that was felt to be most likely toxicity secondary to the    pain medication she has been on.  Will continue to hold her pain    medication and will do MRI this evening in the remote chance that this is    a CVA. 2. Recent right foot surgery.  Continue without significant change although    will give her Tylenol or Toradol as needed for pain relief this evening. DD:  10/29/00 TD:  10/30/00 Job: [id number]

## 2011-01-31 NOTE — Consult Note (Signed)
[facility]                            ENDOCRINOLOGY CONSULTATION   NAME:Ariel Lambert, Ariel Lambert              MRN:          [id number]  DATE:06/01/2006                            DOB:          05-14-1936    REASON FOR VISIT:  Follow up thyroid history.   HISTORY OF PRESENT ILLNESS:  A 75 year old woman who states that she feels  slightly better since her Tapazole was increased to 10 mg a day.  She states  the slightly excessive diaphoresis she was reporting has resolved.   PAST MEDICAL HISTORY:  Same as Jan 29, 2006.   REVIEW OF SYSTEMS:  She has gained several pounds since her last visit.   PHYSICAL EXAMINATION:  Blood pressure 115/74, heart rate 57, temperature is  97.1, weight is 142.  GENERAL:  No distress.  NECK:  I do not appreciate a goiter.  SKIN:  Not diaphoretic.  NEUROLOGIC:  No tremor.   LABORATORY DATA:  On June 01, 2006, TSH normal at 1.69.   IMPRESSION:  Well-controlled hyperthyroidism.   PLAN:  1. Continue Tapazole 10 mg a day.  2. Return in 6 months.                                   Sean A. Everardo All, MD   SAE/MedQ  DD:  06/03/2006  DT:  06/04/2006  Job #:  [id number]   cc:   Darius Bump, M.D.

## 2011-01-31 NOTE — Consult Note (Signed)
[place]  Patient:    Ariel Lambert, Ariel Lambert              MRN: [id number] Proc. Date: 10/29/00 Adm. Date:  [id number] Attending:  Kae Heller Dictator:   [id number] CC:         [facility]   Consultation Report  DATE OF BIRTH:  07/06/1936  REASON FOR CONSULTATION:  Confusion and slurred speech.  HISTORY OF PRESENT ILLNESS:  This is an emergency room evaluation on an established [facility] patient, a 75 year old woman with a history of hypertension and a left brain stroke in September of last year, who underwent bilateral carotid surgeries in September, and again in October 2001.  She was brought to the emergency room for alternation of mental status.  The patient underwent foot surgery yesterday and was discharged home, and seemed to be doing okay initially.  Her daughter reports that she was getting Demerol and Phenergan at home for pain.  At about 4 p.m. the daughter notes that she was more lethargic with slurred speech.  This worsened over the course of the evening, and after midnight last night, her daughter held her medications.  This morning at about 6:30 a.m. the patient was awake and dressed, thinking she had somewhere to go.  She remained confused all day. A right facial droop was also noted, but there was no clear limb weakness. She was seen by her husbands Hospice nurse, who noted pinpoint pupils, as well as hypotension, and a decreased respiratory rate.  EMS was alerted, and she was brought to the emergency room.  She was given Narcan en route, and this seemed to perk her up a little bit.  Presently she thinks she is doing better.  PAST MEDICAL HISTORY: 1. Remarkable for hypertension, fairly well controlled. 2. She had a left brain stroke in September 2001, with residual    aphasia.  She underwent left carotid endarterectomy in September 2001,    and subsequent right  carotid endarterectomy. 3. She also has a history of low back pain with radiculopathy.  FAMILY HISTORY:  Remarkable for stroke in both parents.  SOCIAL HISTORY:  She has not smoked in 11 years.  She denies significant alcohol use.  Her husband is very ill and is a Hospice patient.  Her daughter helps out at home.  ALLERGIES:  SULFA, QUESTION CODEINE.  CURRENT MEDICATIONS: 1. Keflex. 2. Toprol. 3. Norvasc. 3. Klor-Con. 4. Phenergan. 5. Demerol or Mepergan.  REVIEW OF SYSTEMS:  CONSTITUTIONAL:  No fever.  HEAD:  No headache.  EYES:  No visual symptoms.  ENT:  No sore throat or voice changes.  CARDIOVASCULAR:  NO chest pain or palpitations.  RESPIRATORY:  No shortness of breath, no cough. GI:  No nausea, vomiting, diarrhea, or melena.  GENITOURINARY:  No urinary symptoms.  SKIN:  No rash.  MUSCULOSKELETAL:  No back or joint pain.  PHYSICAL EXAMINATION:  VITAL SIGNS:  Temperature 99 degrees, blood pressure 112/71, pulse 72, respirations 16.  O2 saturation 95% on 4 L O2 nasal cannula.  GENERAL:  Mental status:  She is sleepy but arouses easily to voice, and is in no evident distress.  She is able to follow one, two, and usually three-step commands.  She is completely oriented.  She does have some psychomotor slowing. Her speech is slightly dysarthric, but generally normal in content.  NECK:  Supple without bruits.  HEART:  A regular rate and rhythm without  murmurs.  NEUROLOGIC:  Cranial nerves:  Funduscopic examination benign.  Pupils are 2.0 mm, briskly reactive to 1.5 mm.  Extraocular movements normal.  Visual fields are full to confrontation.  There is a slight right facial droop, but no facial weakness.  Tongue and palate move normally in the midline.   Motor examination:  Slightly increased tone in the right upper extremity.  There is normal strength in all tested extremity muscles.  Sensory examination reveals a decreased pinprick in the right upper extremity, although  this is not entirely consistent.  Light touch is intact.  Rapid alternating movements are slightly slowed in the right upper extremity.  Cerebellar function is intact. Gait examination is deferred.  Reflexes are increased in the right upper extremity.  Babinski sign is present on the right.  LABORATORY DATA:  A CT of the head is personally reviewed and reveals an old left brain stroke in the posterior parietal area.  There are no acute findings.  BMET is normal.  CBC is pending.  IMPRESSION: 1. Encephalopathy, probably secondary to medications, doubt but cannot exclude    a new stroke, particularly in the left hemisphere. 2. History of left brain stroke. 3. History of bilateral carotid endarterectomies.  RECOMMENDATIONS: 1. Will check MRI tonight to exclude a new stroke. 2. Supportive care.  If this is a drug-induced encephalopathy, it will    likely clear by tomorrow. DD:  10/29/00 TD:  10/30/00 Job: [id number]

## 2011-06-02 ENCOUNTER — Other Ambulatory Visit: Payer: Self-pay | Admitting: Endocrinology

## 2011-06-12 LAB — COMPREHENSIVE METABOLIC PANEL
ALT: 15
ALT: 15
AST: 13
AST: 14
Albumin: 3.9
Albumin: 3.9
Alkaline Phosphatase: 64
Alkaline Phosphatase: 69
BUN: 11
BUN: 13
CO2: 27
CO2: 31
Calcium: 9.1
Calcium: 9.3
Chloride: 103
Chloride: 104
Creatinine, Ser: 0.74
Creatinine, Ser: 0.79
GFR calc Af Amer: >60
GFR calc Af Amer: >60
GFR calc non Af Amer: >60
GFR calc non Af Amer: >60
Glucose, Bld: 174 — ABNORMAL HIGH
Glucose, Bld: 91
Potassium: 3.4 — ABNORMAL LOW
Potassium: 3.9
Sodium: 139
Sodium: 141
Total Bilirubin: 1.1
Total Bilirubin: 1.2
Total Protein: 6.9
Total Protein: 7

## 2011-06-12 LAB — CK TOTAL AND CKMB (NOT AT ARMC)
CK, MB: 1.5
Relative Index: INVALID
Total CK: 61

## 2011-06-12 LAB — DIFFERENTIAL
Basophils Absolute: 0
Basophils Absolute: 0
Basophils Relative: 0
Basophils Relative: 1
Eosinophils Absolute: 0.1
Eosinophils Absolute: 0.2
Eosinophils Relative: 3
Eosinophils Relative: 4
Lymphocytes Relative: 18
Lymphocytes Relative: 20
Lymphs Abs: 0.9
Lymphs Abs: 1
Monocytes Absolute: 0.3
Monocytes Absolute: 0.4
Monocytes Relative: 7
Monocytes Relative: 8
Neutro Abs: 3.6
Neutro Abs: 3.7
Neutrophils Relative %: 70
Neutrophils Relative %: 71

## 2011-06-12 LAB — URINALYSIS, ROUTINE W REFLEX MICROSCOPIC
Bilirubin Urine: NEGATIVE
Glucose, UA: NEGATIVE
Hgb urine dipstick: NEGATIVE
Ketones, ur: 15 — AB
Nitrite: NEGATIVE
Protein, ur: NEGATIVE
Specific Gravity, Urine: 1.016
Urobilinogen, UA: 0.2
pH: 6

## 2011-06-12 LAB — APTT
aPTT: 30
aPTT: 31

## 2011-06-12 LAB — CBC
HCT: 39.2
HCT: 39.5
Hemoglobin: 13.2
Hemoglobin: 13.2
MCHC: 33.5
MCHC: 33.7
MCV: 91.2
MCV: 91.7
Platelets: 215
Platelets: 215
RBC: 4.3
RBC: 4.31
RDW: 15.9 — ABNORMAL HIGH
RDW: 16 — ABNORMAL HIGH
WBC: 5.2
WBC: 5.2

## 2011-06-12 LAB — TROPONIN I: Troponin I: 0.01

## 2011-06-12 LAB — PROTIME-INR
INR: 1
INR: 1
Prothrombin Time: 13
Prothrombin Time: 13.7

## 2011-06-12 LAB — URINE MICROSCOPIC-ADD ON

## 2011-06-12 LAB — SEDIMENTATION RATE: Sed Rate: 15

## 2011-06-12 LAB — TSH: TSH: 3.44 (ref 0.350–4.500)

## 2011-06-30 ENCOUNTER — Ambulatory Visit: Payer: Medicare Other | Admitting: Endocrinology

## 2011-07-28 ENCOUNTER — Other Ambulatory Visit: Payer: Self-pay | Admitting: Family Medicine

## 2011-07-28 DIAGNOSIS — Z1231 Encounter for screening mammogram for malignant neoplasm of breast: Secondary | ICD-10-CM

## 2011-07-31 ENCOUNTER — Ambulatory Visit (INDEPENDENT_AMBULATORY_CARE_PROVIDER_SITE_OTHER): Payer: Medicare Other | Admitting: Endocrinology

## 2011-07-31 ENCOUNTER — Encounter: Payer: Self-pay | Admitting: Endocrinology

## 2011-07-31 ENCOUNTER — Other Ambulatory Visit (INDEPENDENT_AMBULATORY_CARE_PROVIDER_SITE_OTHER): Payer: Medicare Other

## 2011-07-31 VITALS — BP 102/58 | HR 96 | Temp 97.9°F | Wt 138.0 lb

## 2011-07-31 DIAGNOSIS — E0590 Thyrotoxicosis, unspecified without thyrotoxic crisis or storm: Secondary | ICD-10-CM

## 2011-07-31 NOTE — Progress Notes (Signed)
  Subjective:    Patient ID: Ariel Lambert, female    DOB: 1936/02/23, 75 y.o.   MRN: [id number]  HPI Pt returns for f/u of hyperthyroidism due to multinodular goiter (approx 1999).  It has been controlled with tapazole.  pt states she feels well in general. Past Medical History  Diagnosis Date  . OSTEOPOROSIS 04/30/2007  . ASTHMA 05/03/2009  . HYPERTENSION 04/30/2007  . HYPERTHYROIDISM 06/17/2007  . Multinodular goiter   . Menopause   . Dyslipidemia     No past surgical history on file.  History   Social History  . Marital Status: Widowed    Spouse Name: N/A    Number of Children: N/A  . Years of Education: N/A   Occupational History  . Teacher    Social History Main Topics  . Smoking status: Former Games developer  . Smokeless tobacco: Not on file  . Alcohol Use:   . Drug Use:   . Sexually Active:    Other Topics Concern  . Not on file   Social History Narrative   Lives alone.    Current Outpatient Prescriptions on File Prior to Visit  Medication Sig Dispense Refill  . alendronate (FOSAMAX) 70 MG tablet Take 70 mg by mouth every 7 (seven) days. Take with a full glass of water on an empty stomach.       Marland Kitchen aspirin 325 MG tablet Take 325 mg by mouth daily.        Marland Kitchen b complex vitamins tablet Take 1 tablet by mouth daily.        . Calcium Carbonate-Vitamin D (CALCIUM 500 + D PO) Take 1 tablet by mouth daily.        Marland Kitchen ezetimibe-simvastatin (VYTORIN) 10-20 MG per tablet Take 1 tablet by mouth at bedtime.        . fluticasone (FLONASE) 50 MCG/ACT nasal spray Use as needed       . hydrochlorothiazide (HYDRODIURIL) 12.5 MG tablet Take 12.5 mg by mouth daily.        . methimazole (TAPAZOLE) 10 MG tablet TAKE 1 TABLET EVERY DAY  90 tablet  1  . metoprolol (TOPROL-XL) 50 MG 24 hr tablet Take 50 mg by mouth daily.        . Multiple Vitamin (MULTIVITAMIN) tablet Take 1 tablet by mouth daily.        . potassium chloride (KLOR-CON) 8 MEQ CR tablet Take 8 mEq by mouth daily.          Marland Kitchen telmisartan (MICARDIS) 40 MG tablet Take 1/2 tablet by mouth once daily         Allergies  Allergen Reactions  . Codeine   . Meperidine Hcl   . Nabumetone   . Naproxen     REACTION: itching  . Sulfonamide Derivatives     Family History  Problem Relation Age of Onset  . Heart disease Other     BP 102/58  Pulse 96  Temp(Src) 97.9 F (36.6 C) (Oral)  Wt 138 lb (62.596 kg)  SpO2 96%  Review of Systems Denies fever.    Objective:   Physical Exam VITAL SIGNS:  See vs page GENERAL: no distress NECK: There is no palpable thyroid enlargement.  No thyroid nodule is palpable.  No palpable lymphadenopathy at the anterior neck.   Lab Results  Component Value Date   TSH 2.10 07/31/2011      Assessment & Plan:  Hyperthyroidism, well-controlled

## 2011-07-31 NOTE — Patient Instructions (Addendum)
blood tests are being ordered for you today.  please call [phone] to hear your test results. pending the test results, please continue the same medications for now Please make a follow-up appointment in 6 months. if ever you have fever while taking this medication, stop it and call us, because of the risk of a rare side-effect. (update: i left message on phone-tree:  rx as we discussed)

## 2011-08-01 LAB — TSH: TSH: 2.1 u[IU]/mL (ref 0.35–5.50)

## 2011-09-23 ENCOUNTER — Ambulatory Visit
Admission: RE | Admit: 2011-09-23 | Discharge: 2011-09-23 | Disposition: A | Discharge status: Home / Self Care | Payer: Medicare Other | Source: Ambulatory Visit | Attending: Family Medicine | Admitting: Family Medicine

## 2011-09-23 DIAGNOSIS — Z1231 Encounter for screening mammogram for malignant neoplasm of breast: Secondary | ICD-10-CM

## 2011-10-20 ENCOUNTER — Other Ambulatory Visit: Payer: Self-pay | Admitting: Endocrinology

## 2011-11-25 ENCOUNTER — Ambulatory Visit: Payer: Medicare Other | Attending: Family Medicine

## 2011-11-25 DIAGNOSIS — R262 Difficulty in walking, not elsewhere classified: Secondary | ICD-10-CM | POA: Insufficient documentation

## 2011-11-25 DIAGNOSIS — R269 Unspecified abnormalities of gait and mobility: Secondary | ICD-10-CM | POA: Insufficient documentation

## 2011-11-25 DIAGNOSIS — IMO0001 Reserved for inherently not codable concepts without codable children: Secondary | ICD-10-CM | POA: Insufficient documentation

## 2011-11-28 ENCOUNTER — Ambulatory Visit: Payer: Medicare Other

## 2011-12-03 ENCOUNTER — Ambulatory Visit: Payer: Medicare Other | Admitting: Physical Therapy

## 2011-12-09 ENCOUNTER — Ambulatory Visit: Payer: Medicare Other

## 2011-12-11 ENCOUNTER — Ambulatory Visit: Payer: Medicare Other | Admitting: Physical Therapy

## 2011-12-22 ENCOUNTER — Ambulatory Visit: Payer: Medicare Other | Attending: Family Medicine | Admitting: Physical Therapy

## 2011-12-22 DIAGNOSIS — R262 Difficulty in walking, not elsewhere classified: Secondary | ICD-10-CM | POA: Insufficient documentation

## 2011-12-22 DIAGNOSIS — IMO0001 Reserved for inherently not codable concepts without codable children: Secondary | ICD-10-CM | POA: Insufficient documentation

## 2011-12-22 DIAGNOSIS — R269 Unspecified abnormalities of gait and mobility: Secondary | ICD-10-CM | POA: Insufficient documentation

## 2011-12-23 ENCOUNTER — Ambulatory Visit: Payer: Medicare Other | Admitting: Physical Therapy

## 2012-02-29 ENCOUNTER — Other Ambulatory Visit: Payer: Self-pay | Admitting: Endocrinology

## 2012-06-01 ENCOUNTER — Other Ambulatory Visit: Payer: Self-pay | Admitting: Endocrinology

## 2012-06-07 ENCOUNTER — Other Ambulatory Visit: Payer: Self-pay | Admitting: Family Medicine

## 2012-06-07 ENCOUNTER — Ambulatory Visit
Admission: RE | Admit: 2012-06-07 | Discharge: 2012-06-07 | Disposition: A | Discharge status: Home / Self Care | Payer: Medicare Other | Source: Ambulatory Visit | Attending: Family Medicine | Admitting: Family Medicine

## 2012-06-07 DIAGNOSIS — M542 Cervicalgia: Secondary | ICD-10-CM

## 2012-07-05 ENCOUNTER — Encounter: Payer: Self-pay | Admitting: Emergency Medicine

## 2012-07-06 ENCOUNTER — Ambulatory Visit (INDEPENDENT_AMBULATORY_CARE_PROVIDER_SITE_OTHER): Payer: Medicare Other | Admitting: Emergency Medicine

## 2012-07-06 ENCOUNTER — Encounter: Payer: Self-pay | Admitting: Emergency Medicine

## 2012-07-06 VITALS — BP 130/80 | HR 74 | Temp 98.5°F | Ht 63.0 in | Wt 134.8 lb

## 2012-07-06 DIAGNOSIS — J45909 Unspecified asthma, uncomplicated: Secondary | ICD-10-CM

## 2012-07-06 NOTE — Progress Notes (Signed)
Subjective:    Patient ID: Ariel Lambert, female    DOB: 11-28-1935, 76 y.o.   MRN: [id number]  HPI 76 yo never smoker, hx HTN, hypothyroidism, CVA. Asthma dx in 2007 after a hospitalization. 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 She has allergic rhinitis and drainage, currently on zyrtec and fluticasone spray. She has had at least two hospitalizations for breathing. She reports congestion, flares consist of dyspnea, cough, wheeze. Remains very active unless flaring. She has been on Advair in the past, currently on Dulera via spacer.     Review of Systems  Constitutional: Negative for fever and unexpected weight change.  HENT: Negative for ear pain, nosebleeds, congestion, sore throat, rhinorrhea, sneezing, trouble swallowing, dental problem, postnasal drip and sinus pressure.   Eyes: Negative for redness and itching.  Respiratory: Negative for cough, chest tightness, shortness of breath and wheezing.   Cardiovascular: Negative for palpitations and leg swelling.  Gastrointestinal: Negative for nausea and vomiting.  Genitourinary: Negative for dysuria.  Musculoskeletal: Negative for joint swelling.  Skin: Negative for rash.  Neurological: Negative for headaches.  Hematological: Does not bruise/bleed easily.  Psychiatric/Behavioral: Negative for dysphoric mood. The patient is not nervous/anxious.     Past Medical History  Diagnosis Date  . OSTEOPOROSIS 04/30/2007  . ASTHMA 05/03/2009  . HYPERTENSION 04/30/2007  . HYPERTHYROIDISM 06/17/2007  . Multinodular goiter   . Menopause   . Dyslipidemia   . CVA (cerebral infarction)      Family History  Problem Relation Age of Onset  . Heart disease Other      History   Social History  . Marital Status: Widowed    Spouse Name: N/A    Number of Children: N/A  . Years of Education: N/A   Occupational History  . Teacher    Social History Main Topics  . Smoking status: Former Games developer  . Smokeless  tobacco: Not on file  . Alcohol Use: Yes     Socially  . Drug Use: No  . Sexually Active:    Other Topics Concern  . Not on file   Social History Narrative   Lives alone.     Allergies  Allergen Reactions  . Codeine   . Meperidine Hcl   . Nabumetone   . Naproxen     REACTION: itching  . Sulfonamide Derivatives      Outpatient Prescriptions Prior to Visit  Medication Sig Dispense Refill  . albuterol (PROVENTIL HFA;VENTOLIN HFA) 108 (90 BASE) MCG/ACT inhaler Inhale 2 puffs into the lungs every 6 (six) hours as needed.      Marland Kitchen aspirin 325 MG tablet Take 325 mg by mouth daily.        Marland Kitchen b complex vitamins tablet Take 1 tablet by mouth daily.        . Calcium Carbonate-Vitamin D (CALCIUM 500 + D PO) Take 1 tablet by mouth daily.        Marland Kitchen ezetimibe-simvastatin (VYTORIN) 10-20 MG per tablet Take 1 tablet by mouth at bedtime.        . fluticasone (FLONASE) 50 MCG/ACT nasal spray Use as needed       . hydrochlorothiazide (HYDRODIURIL) 12.5 MG tablet Take 12.5 mg by mouth daily.        . methimazole (TAPAZOLE) 10 MG tablet TAKE 1 TABLET EVERY DAY  90 tablet  0  . metoprolol (TOPROL-XL) 50 MG 24 hr tablet Take 50 mg by mouth daily.        Marland Kitchen  montelukast (SINGULAIR) 10 MG tablet Take 10 mg by mouth at bedtime.      . Multiple Vitamin (MULTIVITAMIN) tablet Take 1 tablet by mouth daily.        . potassium chloride (KLOR-CON) 8 MEQ CR tablet Take 8 mEq by mouth daily.        Marland Kitchen telmisartan (MICARDIS) 40 MG tablet Take 1/2 tablet by mouth once daily       . albuterol (PROVENTIL) (2.5 MG/3ML) 0.083% nebulizer solution Take 2.5 mg by nebulization every 6 (six) hours as needed.      . DULERA 100-5 MCG/ACT AERO 2 puffs 2 (two) times daily.       Marland Kitchen alendronate (FOSAMAX) 70 MG tablet Take 70 mg by mouth every 7 (seven) days. Take with a full glass of water on an empty stomach.       . cetirizine (ZYRTEC) 10 MG tablet Take 10 mg by mouth daily.      Marland Kitchen PREDNISONE, PAK, PO Take by mouth.              Objective:   Physical Exam Filed Vitals:   07/06/12 1627  BP: 130/80  Pulse: 74  Temp: 98.5 F (36.9 C)   Gen: Pleasant, thin, in no distress,  normal affect  ENT: No lesions,  mouth clear,  oropharynx clear, no postnasal drip  Neck: No JVD, no TMG, no carotid bruits  Lungs: No use of accessory muscles, clear without rales or rhonchi  Cardiovascular: RRR, heart sounds normal, no murmur or gallops, no peripheral edema  Musculoskeletal: No deformities, no cyanosis or clubbing  Neuro: alert, non focal  Skin: Warm, no lesions or rashes      Assessment & Plan:  ASTHMA Currently stable on dulera + saba Allergies controlled - contineu same meds - full PFT - rov next available

## 2012-07-06 NOTE — Patient Instructions (Addendum)
Please continue your Dulera  Have albuterol available to use as needed Continue your zyrtec and fluticasone nasal spray  We will perform full pulmonary function testing at your next office visit Follow with Dr Delton Coombes next available with full PFT

## 2012-07-06 NOTE — Assessment & Plan Note (Signed)
Currently stable on dulera + saba Allergies controlled - contineu same meds - full PFT - rov next available

## 2012-07-14 ENCOUNTER — Other Ambulatory Visit: Payer: Self-pay | Admitting: Neurosurgery

## 2012-07-14 DIAGNOSIS — M47812 Spondylosis without myelopathy or radiculopathy, cervical region: Secondary | ICD-10-CM

## 2012-07-14 DIAGNOSIS — M542 Cervicalgia: Secondary | ICD-10-CM

## 2012-07-14 DIAGNOSIS — M5030 Other cervical disc degeneration, unspecified cervical region: Secondary | ICD-10-CM

## 2012-07-20 ENCOUNTER — Ambulatory Visit
Admission: RE | Admit: 2012-07-20 | Discharge: 2012-07-20 | Disposition: A | Discharge status: Home / Self Care | Payer: Medicare Other | Source: Ambulatory Visit | Attending: Neurosurgery | Admitting: Neurosurgery

## 2012-07-20 DIAGNOSIS — M5030 Other cervical disc degeneration, unspecified cervical region: Secondary | ICD-10-CM

## 2012-07-20 DIAGNOSIS — M542 Cervicalgia: Secondary | ICD-10-CM

## 2012-07-20 DIAGNOSIS — M47812 Spondylosis without myelopathy or radiculopathy, cervical region: Secondary | ICD-10-CM

## 2012-08-16 ENCOUNTER — Encounter: Payer: Self-pay | Admitting: Emergency Medicine

## 2012-08-16 ENCOUNTER — Ambulatory Visit (INDEPENDENT_AMBULATORY_CARE_PROVIDER_SITE_OTHER): Payer: Medicare Other | Admitting: Emergency Medicine

## 2012-08-16 VITALS — BP 112/82 | HR 77 | Temp 97.9°F | Ht 63.0 in | Wt 139.0 lb

## 2012-08-16 DIAGNOSIS — J45909 Unspecified asthma, uncomplicated: Secondary | ICD-10-CM

## 2012-08-16 LAB — PULMONARY FUNCTION TEST

## 2012-08-16 NOTE — Patient Instructions (Addendum)
STOP Spiriva Continue Dulera 200/33mcg, 2 puffs twice a day Continue your fluticasone nasal spray (Flonase) Continue your montelukast every evening (Singulair) Follow with Dr Delton Coombes in 6 months or sooner if you have any problems

## 2012-08-16 NOTE — Assessment & Plan Note (Signed)
Ariel Lambert confirmed mild AFL. She is on Dulera + Spiriva (didnt tell me until now about this one). Suspect that her jitters will get better on single agent.  - stop Spiriva - continue Dulera 200, consider decrease back to dulera 100 in the future - albuterol prn - rov 6 months

## 2012-08-16 NOTE — Progress Notes (Signed)
  Subjective:    Patient ID: Ariel Lambert, female    DOB: Aug 30, 1936, 76 y.o.   MRN: [id number]  HPI 76 yo never smoker, hx HTN, hypothyroidism, CVA. Asthma dx in 2007 after a hospitalization. 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 She has allergic rhinitis and drainage, currently on zyrtec and fluticasone spray. She has had at least two hospitalizations for breathing. She reports congestion, flares consist of dyspnea, cough, wheeze. Remains very active unless flaring. She has been on Advair in the past, currently on Dulera via spacer.    ROV 08/16/12 -- f/u visit for asthma, allergies and rhinitis, cough. PFT today confirm mild AFL (FEV1 2.29L, 126% predicted). Currently on Dulera 200, (she does have some "jitteriness).  She had 3 AE this year. She is on singulair and fluticasone qd. She tells me that she is also on Spiriva! Has been on it all along but didn't tell me. Currently clinically stable.   PULMONARY FUNCTON TEST 08/16/2012  FVC 3.31  FEV1 2.29  FEV1/FVC 69.2  FVC  % Predicted 127  FEV % Predicted 126  FeF 25-75 1.44  FeF 25-75 % Predicted 2.11       Objective:   Physical Exam Filed Vitals:   08/16/12 1152  BP: 112/82  Pulse: 77  Temp: 97.9 F (36.6 C)   Gen: Pleasant, thin, in no distress,  normal affect  ENT: No lesions,  mouth clear,  oropharynx clear, no postnasal drip  Neck: No JVD, no TMG, no carotid bruits  Lungs: No use of accessory muscles, clear without rales or rhonchi  Cardiovascular: RRR, heart sounds normal, no murmur or gallops, no peripheral edema  Musculoskeletal: No deformities, no cyanosis or clubbing  Neuro: alert, non focal  Skin: Warm, no lesions or rashes      Assessment & Plan:  ASTHMA Cleda Daub confirmed mild AFL. She is on Dulera + Spiriva (didnt tell me until now about this one). Suspect that her jitters will get better on single agent.  - stop Spiriva - continue Dulera 200, consider decrease back to  dulera 100 in the future - albuterol prn - rov 6 months

## 2012-08-16 NOTE — Progress Notes (Signed)
PFT done today. 

## 2012-08-18 ENCOUNTER — Other Ambulatory Visit: Payer: Self-pay | Admitting: Family Medicine

## 2012-08-18 DIAGNOSIS — Z1231 Encounter for screening mammogram for malignant neoplasm of breast: Secondary | ICD-10-CM

## 2012-08-30 ENCOUNTER — Other Ambulatory Visit: Payer: Self-pay | Admitting: Endocrinology

## 2012-09-24 ENCOUNTER — Ambulatory Visit (INDEPENDENT_AMBULATORY_CARE_PROVIDER_SITE_OTHER): Payer: Medicare Other | Admitting: Endocrinology

## 2012-09-24 ENCOUNTER — Encounter: Payer: Self-pay | Admitting: Endocrinology

## 2012-09-24 VITALS — BP 136/80 | HR 82 | Temp 97.8°F | Wt 134.0 lb

## 2012-09-24 DIAGNOSIS — E0590 Thyrotoxicosis, unspecified without thyrotoxic crisis or storm: Secondary | ICD-10-CM

## 2012-09-24 LAB — TSH: TSH: 2.099 u[IU]/mL (ref 0.350–4.500)

## 2012-09-24 NOTE — Patient Instructions (Addendum)
blood tests are being requested for you today.  We'll contact you with results. pending the test results, please continue the same methimazole for now Please make a follow-up appointment in 6 months. if ever you have fever while taking this medication, stop it and call us, because of the risk of a rare side-effect.   

## 2012-09-24 NOTE — Progress Notes (Signed)
Subjective:    Patient ID: Ariel Lambert, female    DOB: [birth year], 77 y.o.   MRN: [id number]  HPI Pt returns for f/u of hyperthyroidism due to multinodular goiter (dx'ed approx 1999).  It has been controlled with tapazole.  pt states she feels well in general.   Past Medical History  Diagnosis Date  . OSTEOPOROSIS 04/30/2007  . ASTHMA 05/03/2009  . HYPERTENSION 04/30/2007  . HYPERTHYROIDISM 06/17/2007  . Multinodular goiter   . Menopause   . Dyslipidemia   . CVA (cerebral infarction)     Past Surgical History  Procedure Date  . Other surgical history     discectomy  . Carotid artery surgery   . Neck surgery   . Back surgery     History   Social History  . Marital Status: Widowed    Spouse Name: N/A    Number of Children: N/A  . Years of Education: N/A   Occupational History  . Teacher    Social History Main Topics  . Smoking status: Former Games developer  . Smokeless tobacco: Not on file  . Alcohol Use: Yes     Comment: Socially  . Drug Use: No  . Sexually Active:    Other Topics Concern  . Not on file   Social History Narrative   Lives alone.    Current Outpatient Prescriptions on File Prior to Visit  Medication Sig Dispense Refill  . albuterol (PROVENTIL HFA;VENTOLIN HFA) 108 (90 BASE) MCG/ACT inhaler Inhale 2 puffs into the lungs every 6 (six) hours as needed.      Marland Kitchen aspirin 325 MG tablet Take 325 mg by mouth daily.        Marland Kitchen b complex vitamins tablet Take 1 tablet by mouth daily.        . Calcium Carbonate-Vitamin D (CALCIUM 500 + D PO) Take 1 tablet by mouth 2 (two) times daily.       Marland Kitchen ezetimibe-simvastatin (VYTORIN) 10-20 MG per tablet Take 1 tablet by mouth at bedtime.        . fluticasone (FLONASE) 50 MCG/ACT nasal spray Use as needed       . hydrochlorothiazide (HYDRODIURIL) 12.5 MG tablet Take 12.5 mg by mouth daily.        Marland Kitchen levalbuterol (XOPENEX) 0.63 MG/3ML nebulizer solution Take 1 ampule by nebulization every 4 (four) hours as needed.        . methimazole (TAPAZOLE) 10 MG tablet TAKE 1 TABLET EVERY DAY  30 tablet  0  . metoprolol (TOPROL-XL) 50 MG 24 hr tablet Take 50 mg by mouth daily.        . Mometasone Furo-Formoterol Fum (DULERA) 200-5 MCG/ACT AERO Inhale 2 puffs into the lungs 2 (two) times daily.      . montelukast (SINGULAIR) 10 MG tablet Take 10 mg by mouth at bedtime.      . Multiple Vitamin (MULTIVITAMIN) tablet Take 1 tablet by mouth daily.        . potassium chloride (KLOR-CON) 8 MEQ CR tablet Take 8 mEq by mouth daily.        Marland Kitchen telmisartan (MICARDIS) 40 MG tablet Take 1/2 tablet by mouth once daily         Allergies  Allergen Reactions  . Codeine   . Meperidine Hcl     (Demerol)  . Nabumetone     (Relafen)  . Naproxen     REACTION: itching  . Sulfonamide Derivatives     Family History  Problem Relation Age  of Onset  . Heart disease Other     BP 136/80  Pulse 82  Temp 97.8 F (36.6 C) (Oral)  Wt 134 lb (60.782 kg)  SpO2 98%    Review of Systems Denies fever.    Objective:   Physical Exam VITAL SIGNS:  See vs page GENERAL: no distress NECK: There is no palpable thyroid enlargement.  No thyroid nodule is palpable.  No palpable lymphadenopathy at the anterior neck.   Lab Results  Component Value Date   TSH 2.099 09/24/2012      Assessment & Plan:  Hyperthyroidism, well-controlled

## 2012-09-27 ENCOUNTER — Ambulatory Visit
Admission: RE | Admit: 2012-09-27 | Discharge: 2012-09-27 | Disposition: A | Discharge status: Home / Self Care | Payer: Medicare Other | Source: Ambulatory Visit | Attending: Family Medicine | Admitting: Family Medicine

## 2012-09-27 DIAGNOSIS — Z1231 Encounter for screening mammogram for malignant neoplasm of breast: Secondary | ICD-10-CM

## 2012-10-11 ENCOUNTER — Other Ambulatory Visit: Payer: Self-pay | Admitting: *Deleted

## 2012-10-11 MED ORDER — METHIMAZOLE 10 MG PO TABS: 10.0000 mg | ORAL_TABLET | Freq: Every day | ORAL | Status: DC

## 2013-02-09 ENCOUNTER — Telehealth: Payer: Self-pay | Admitting: Emergency Medicine

## 2013-02-09 NOTE — Telephone Encounter (Signed)
Called pt to schd follow up apt. Left message x3. Sent Letter 02/09/13 °

## 2013-03-16 ENCOUNTER — Ambulatory Visit (INDEPENDENT_AMBULATORY_CARE_PROVIDER_SITE_OTHER): Payer: Medicare Other | Admitting: Emergency Medicine

## 2013-03-16 ENCOUNTER — Encounter: Payer: Self-pay | Admitting: Emergency Medicine

## 2013-03-16 VITALS — BP 122/80 | HR 70 | Temp 99.0°F | Ht 63.5 in | Wt 140.8 lb

## 2013-03-16 DIAGNOSIS — J45909 Unspecified asthma, uncomplicated: Secondary | ICD-10-CM

## 2013-03-16 DIAGNOSIS — J309 Allergic rhinitis, unspecified: Secondary | ICD-10-CM

## 2013-03-16 MED ORDER — LORATADINE 10 MG PO TABS: 10.0000 mg | ORAL_TABLET | Freq: Every day | ORAL | Status: DC

## 2013-03-16 NOTE — Assessment & Plan Note (Signed)
-   add loratadine to nasal steroid and singulair

## 2013-03-16 NOTE — Patient Instructions (Addendum)
Please continue your Elwin Sleight twice a day Use albuterol as needed Start loratadine 10mg  daily Continue your nasal spray and singulair Flu shot in the Fall  Follow with Dr Delton Coombes in 6 months or sooner if you have any problems

## 2013-03-16 NOTE — Assessment & Plan Note (Signed)
Stable at this time.  - stay off spiriva - consider decrease Dulera back to 100 next time - albuterol prn - flu shot this Fall - rov 6

## 2013-03-16 NOTE — Progress Notes (Signed)
  Subjective:    Patient ID: Ariel Lambert, female    DOB: [birth year], 77 y.o.   MRN: [id number]  HPI 77 yo never smoker, hx HTN, hypothyroidism, CVA. Asthma dx in 2007 after a hospitalization. 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 She has allergic rhinitis and drainage, currently on zyrtec and fluticasone spray. She has had at least two hospitalizations for breathing. She reports congestion, flares consist of dyspnea, cough, wheeze. Remains very active unless flaring. She has been on Advair in the past, currently on Dulera via spacer.    ROV 08/16/12 -- f/u visit for asthma, allergies and rhinitis, cough. PFT today confirm mild AFL (FEV1 2.29L, 126% predicted). Currently on Dulera 200, (she does have some "jitteriness).  She had 3 AE this year. She is on singulair and fluticasone qd. She tells me that she is also on Spiriva! Has been on it all along but didn't tell me. Currently clinically stable.   ROV 03/16/13 -- Hx of asthma, allergies and rhinitis, cough. Last time we stopped Spiriva, continued dulera 200.  She is on fluticasone, singulair. She occasionally takes loratadine. She has done well off spiriva. No exacerbations.    PULMONARY FUNCTON TEST 08/16/2012  FVC 3.31  FEV1 2.29  FEV1/FVC 69.2  FVC  % Predicted 127  FEV % Predicted 126  FeF 25-75 1.44  FeF 25-75 % Predicted 2.11       Objective:   Physical Exam Filed Vitals:   03/16/13 1546  BP: 122/80  Pulse: 70  Temp: 99 F (37.2 C)   Gen: Pleasant, thin, in no distress,  normal affect  ENT: No lesions,  mouth clear,  oropharynx clear, no postnasal drip  Neck: No JVD, no TMG, no carotid bruits  Lungs: No use of accessory muscles, clear without rales or rhonchi  Cardiovascular: RRR, heart sounds normal, no murmur or gallops, no peripheral edema  Musculoskeletal: No deformities, no cyanosis or clubbing  Neuro: alert, non focal  Skin: Warm, no lesions or rashes      Assessment & Plan:   ASTHMA Stable at this time.  - stay off spiriva - consider decrease Dulera back to 100 next time - albuterol prn - flu shot this Fall - rov 6  Allergic rhinitis - add loratadine to nasal steroid and singulair

## 2013-03-28 ENCOUNTER — Encounter: Payer: Self-pay | Admitting: Endocrinology

## 2013-03-28 ENCOUNTER — Ambulatory Visit (INDEPENDENT_AMBULATORY_CARE_PROVIDER_SITE_OTHER): Payer: Medicare Other | Admitting: Endocrinology

## 2013-03-28 VITALS — BP 118/70 | HR 58 | Ht 63.0 in | Wt 139.0 lb

## 2013-03-28 DIAGNOSIS — E0590 Thyrotoxicosis, unspecified without thyrotoxic crisis or storm: Secondary | ICD-10-CM

## 2013-03-28 NOTE — Patient Instructions (Addendum)
blood tests are being requested for you today.  We'll contact you with results. pending the test results, please continue the same methimazole for now Please make a follow-up appointment in 6 months. if ever you have fever while taking this medication, stop it and call us, because of the risk of a rare side-effect.   

## 2013-03-28 NOTE — Progress Notes (Signed)
Subjective:    Patient ID: Ariel Lambert, female    DOB: 06/11/1936, 77 y.o.   MRN: [id number]  HPI Pt returns for f/u of hyperthyroidism due to multinodular goiter (dx'ed approx 1999).  It has been controlled with tapazole.  pt states she feels well in general.   Past Medical History  Diagnosis Date  . OSTEOPOROSIS 04/30/2007  . ASTHMA 05/03/2009  . HYPERTENSION 04/30/2007  . HYPERTHYROIDISM 06/17/2007  . Multinodular goiter   . Menopause   . Dyslipidemia   . CVA (cerebral infarction)     Past Surgical History  Procedure Laterality Date  . Other surgical history      discectomy  . Carotid artery surgery    . Neck surgery    . Back surgery      History   Social History  . Marital Status: Widowed    Spouse Name: N/A    Number of Children: N/A  . Years of Education: N/A   Occupational History  . Teacher    Social History Main Topics  . Smoking status: Former Games developer  . Smokeless tobacco: Never Used  . Alcohol Use: Yes     Comment: Socially  . Drug Use: No  . Sexually Active: Not on file   Other Topics Concern  . Not on file   Social History Narrative   Lives alone.    Current Outpatient Prescriptions on File Prior to Visit  Medication Sig Dispense Refill  . albuterol (PROVENTIL HFA;VENTOLIN HFA) 108 (90 BASE) MCG/ACT inhaler Inhale 2 puffs into the lungs every 6 (six) hours as needed.      Marland Kitchen aspirin 325 MG tablet Take 325 mg by mouth daily.        Marland Kitchen b complex vitamins tablet Take 1 tablet by mouth daily.        . Calcium Carbonate-Vitamin D (CALCIUM 500 + D PO) Take 1 tablet by mouth 2 (two) times daily.       Marland Kitchen ezetimibe-simvastatin (VYTORIN) 10-20 MG per tablet Take 1 tablet by mouth at bedtime.        . fluticasone (FLONASE) 50 MCG/ACT nasal spray Use as needed       . hydrochlorothiazide (HYDRODIURIL) 12.5 MG tablet Take 12.5 mg by mouth daily.        Marland Kitchen levalbuterol (XOPENEX) 0.63 MG/3ML nebulizer solution Take 1 ampule by nebulization every 4  (four) hours as needed.      . loratadine (CLARITIN) 10 MG tablet Take 1 tablet (10 mg total) by mouth daily.  30 tablet  11  . methimazole (TAPAZOLE) 10 MG tablet Take 1 tablet (10 mg total) by mouth daily.  30 tablet  6  . metoprolol (TOPROL-XL) 50 MG 24 hr tablet Take 50 mg by mouth daily.        . Mometasone Furo-Formoterol Fum (DULERA) 200-5 MCG/ACT AERO Inhale 2 puffs into the lungs 2 (two) times daily.      . montelukast (SINGULAIR) 10 MG tablet Take 10 mg by mouth at bedtime.      . Multiple Vitamin (MULTIVITAMIN) tablet Take 1 tablet by mouth daily.        . potassium chloride (KLOR-CON) 8 MEQ CR tablet Take 8 mEq by mouth daily.        Marland Kitchen telmisartan (MICARDIS) 40 MG tablet Take 1/2 tablet by mouth once daily        No current facility-administered medications on file prior to visit.    Allergies  Allergen Reactions  . Codeine   .  Meperidine Hcl     (Demerol)  . Nabumetone     (Relafen)  . Naproxen     REACTION: itching  . Sulfonamide Derivatives     Family History  Problem Relation Age of Onset  . Heart disease Other     BP 118/70  Pulse 58  Ht 5\' 3"  (1.6 m)  Wt 139 lb (63.05 kg)  BMI 24.63 kg/m2  SpO2 97%  Review of Systems Denies fever.      Objective:   Physical Exam VITAL SIGNS:  See vs page GENERAL: no distress NECK: There is a small multinodular goiter palpable.  No palpable lymphadenopathy at the anterior neck.     Lab Results  Component Value Date   TSH 3.16 03/28/2013      Assessment & Plan:  Hyperthyroidism, well-controlled

## 2013-03-29 ENCOUNTER — Telehealth: Payer: Self-pay | Admitting: *Deleted

## 2013-03-29 LAB — TSH: TSH: 3.16 u[IU]/mL (ref 0.35–5.50)

## 2013-03-29 NOTE — Telephone Encounter (Signed)
Called pt and lvm stating that her TSH lab results were normal. Dr Everardo All said to continue on same medication. Any questions call us back.

## 2013-05-26 ENCOUNTER — Other Ambulatory Visit: Payer: Self-pay

## 2013-05-26 MED ORDER — METHIMAZOLE 10 MG PO TABS: 10.0000 mg | ORAL_TABLET | Freq: Every day | ORAL | Status: DC

## 2013-05-27 ENCOUNTER — Other Ambulatory Visit: Payer: Self-pay | Admitting: *Deleted

## 2013-05-27 MED ORDER — METHIMAZOLE 10 MG PO TABS: 10.0000 mg | ORAL_TABLET | Freq: Every day | ORAL | Status: DC

## 2013-07-25 ENCOUNTER — Other Ambulatory Visit: Payer: Self-pay | Admitting: *Deleted

## 2013-07-25 MED ORDER — METHIMAZOLE 10 MG PO TABS: 10.0000 mg | ORAL_TABLET | Freq: Every day | ORAL | Status: DC

## 2013-07-25 NOTE — Telephone Encounter (Signed)
Requesting 90 day supply.

## 2013-08-30 ENCOUNTER — Other Ambulatory Visit: Payer: Self-pay

## 2013-08-30 DIAGNOSIS — Z803 Family history of malignant neoplasm of breast: Secondary | ICD-10-CM

## 2013-08-30 DIAGNOSIS — Z1231 Encounter for screening mammogram for malignant neoplasm of breast: Secondary | ICD-10-CM

## 2013-09-30 ENCOUNTER — Ambulatory Visit
Admission: RE | Admit: 2013-09-30 | Discharge: 2013-09-30 | Disposition: A | Discharge status: Home / Self Care | Payer: Medicare Other | Source: Ambulatory Visit

## 2013-09-30 ENCOUNTER — Encounter: Payer: Self-pay | Admitting: Emergency Medicine

## 2013-09-30 DIAGNOSIS — Z1231 Encounter for screening mammogram for malignant neoplasm of breast: Secondary | ICD-10-CM

## 2013-09-30 DIAGNOSIS — Z803 Family history of malignant neoplasm of breast: Secondary | ICD-10-CM

## 2013-10-03 ENCOUNTER — Ambulatory Visit: Payer: Medicare Other | Admitting: Endocrinology

## 2013-10-05 ENCOUNTER — Ambulatory Visit: Payer: Medicare Other | Admitting: Endocrinology

## 2013-10-11 ENCOUNTER — Encounter: Payer: Self-pay | Admitting: Emergency Medicine

## 2013-10-11 ENCOUNTER — Ambulatory Visit (INDEPENDENT_AMBULATORY_CARE_PROVIDER_SITE_OTHER): Payer: Medicare Other | Admitting: Emergency Medicine

## 2013-10-11 ENCOUNTER — Encounter: Payer: Self-pay | Admitting: Endocrinology

## 2013-10-11 ENCOUNTER — Ambulatory Visit (INDEPENDENT_AMBULATORY_CARE_PROVIDER_SITE_OTHER): Payer: Medicare Other | Admitting: Endocrinology

## 2013-10-11 VITALS — BP 110/64 | HR 81 | Temp 97.9°F | Ht 63.0 in | Wt 137.0 lb

## 2013-10-11 VITALS — BP 112/70 | HR 59 | Ht 63.0 in | Wt 138.4 lb

## 2013-10-11 DIAGNOSIS — J45909 Unspecified asthma, uncomplicated: Secondary | ICD-10-CM

## 2013-10-11 DIAGNOSIS — E0590 Thyrotoxicosis, unspecified without thyrotoxic crisis or storm: Secondary | ICD-10-CM

## 2013-10-11 DIAGNOSIS — J309 Allergic rhinitis, unspecified: Secondary | ICD-10-CM

## 2013-10-11 LAB — TSH: TSH: 1.89 u[IU]/mL (ref 0.35–5.50)

## 2013-10-11 LAB — T4, FREE: Free T4: 1.13 ng/dL (ref 0.60–1.60)

## 2013-10-11 NOTE — Patient Instructions (Signed)
blood tests are being requested for you today.  We'll contact you with results. pending the test results, please continue the same methimazole for now Please make a follow-up appointment in 6 months. if ever you have fever while taking this medication, stop it and call us, because of the risk of a rare side-effect.

## 2013-10-11 NOTE — Assessment & Plan Note (Signed)
-   singulair - loratadine - chlorpheniramine  - consider restart fluticasone if she is still having sx

## 2013-10-11 NOTE — Assessment & Plan Note (Signed)
-   continue Dulera - continue allergy regimen

## 2013-10-11 NOTE — Progress Notes (Signed)
  Subjective:    Patient ID: Elon SpannerElizabeth A Nocera, female    DOB: 1936/03/27, 78 y.o.   MRN: [id number]  HPI 78 yo never smoker, hx HTN, hypothyroidism, CVA. Asthma dx in 2007 after a hospitalization. 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 She has allergic rhinitis and drainage, currently on zyrtec and fluticasone spray. She has had at least two hospitalizations for breathing. She reports congestion, flares consist of dyspnea, cough, wheeze. Remains very active unless flaring. She has been on Advair in the past, currently on Dulera via spacer.    ROV 08/16/12 -- f/u visit for asthma, allergies and rhinitis, cough. PFT today confirm mild AFL (FEV1 2.29L, 126% predicted). Currently on Dulera 200, (she does have some "jitteriness).  She had 3 AE this year. She is on singulair and fluticasone qd. She tells me that she is also on Spiriva! Has been on it all along but didn't tell me. Currently clinically stable.   ROV 03/16/13 -- Hx of asthma, allergies and rhinitis, cough. Last time we stopped Spiriva, continued dulera 200.  She is on fluticasone, singulair. She occasionally takes loratadine. She has done well off spiriva. No exacerbations.   ROV 10/11/13 -- Hx of asthma, allergies and rhinitis, cough. She has been doing well. She had a URI in late December, was subsequently treated for sinus infxn. Did not require pred. She is on Dulera 200, feels that it helps. She has rarely used albuterol. She has chronic rhinitis, is on singulair, loratadine. She is no longer taking fluticasone.    PULMONARY FUNCTON TEST 08/16/2012  FVC 3.31  FEV1 2.29  FEV1/FVC 69.2  FVC  % Predicted 127  FEV % Predicted 126  FeF 25-75 1.44  FeF 25-75 % Predicted 2.11       Objective:   Physical Exam Filed Vitals:   10/11/13 1629  BP: 112/70  Pulse: 59  Height: 5\' 3"  (1.6 m)  Weight: 138 lb 6.4 oz (62.778 kg)  SpO2: 95%   Gen: Pleasant, thin, in no distress,  normal affect  ENT: No lesions,   mouth clear,  oropharynx clear, no postnasal drip  Neck: No JVD, no TMG, no carotid bruits  Lungs: No use of accessory muscles, clear without rales or rhonchi  Cardiovascular: RRR, heart sounds normal, no murmur or gallops, no peripheral edema  Musculoskeletal: No deformities, no cyanosis or clubbing  Neuro: alert, non focal  Skin: Warm, no lesions or rashes      Assessment & Plan:  ASTHMA - continue Dulera - continue allergy regimen  Allergic rhinitis - singulair - loratadine - chlorpheniramine  - consider restart fluticasone if she is still having sx

## 2013-10-11 NOTE — Patient Instructions (Signed)
Please continue your Dulera 200, 2 puffs twice a day. Rinse your mouth after using Continue your other medications as you are taking them  Try using chlorpheniramine OTC as directed for your allergies and congestion Follow with Dr Delton CoombesByrum in 6 months or sooner if you have any problems

## 2013-10-11 NOTE — Progress Notes (Signed)
Subjective:    Patient ID: Ariel Lambert, female    DOB: 06/29/1936, 78 y.o.   MRN: [id number]  HPI Pt returns for f/u of hyperthyroidism due to small multinodular goiter (dx'ed approx 1999; she chose chronic tapazole rx; most recent US was in 2005).  It has been controlled with tapazole.  pt states she feels well in general.   Past Medical History  Diagnosis Date  . OSTEOPOROSIS 04/30/2007  . ASTHMA 05/03/2009  . HYPERTENSION 04/30/2007  . HYPERTHYROIDISM 06/17/2007  . Multinodular goiter   . Menopause   . Dyslipidemia   . CVA (cerebral infarction)     Past Surgical History  Procedure Laterality Date  . Other surgical history      discectomy  . Carotid artery surgery    . Neck surgery    . Back surgery      History   Social History  . Marital Status: Widowed    Spouse Name: N/A    Number of Children: N/A  . Years of Education: N/A   Occupational History  . Teacher    Social History Main Topics  . Smoking status: Former Games developermoker  . Smokeless tobacco: Never Used  . Alcohol Use: Yes     Comment: Socially  . Drug Use: No  . Sexual Activity: Not on file   Other Topics Concern  . Not on file   Social History Narrative   Lives alone.    Current Outpatient Prescriptions on File Prior to Visit  Medication Sig Dispense Refill  . albuterol (PROVENTIL HFA;VENTOLIN HFA) 108 (90 BASE) MCG/ACT inhaler Inhale 2 puffs into the lungs every 6 (six) hours as needed.      Marland Kitchen. aspirin 325 MG tablet Take 325 mg by mouth daily.        Marland Kitchen. b complex vitamins tablet Take 1 tablet by mouth daily.        . Calcium Carbonate-Vitamin D (CALCIUM 500 + D PO) Take 1 tablet by mouth 2 (two) times daily.       Marland Kitchen. ezetimibe-simvastatin (VYTORIN) 10-20 MG per tablet Take 1 tablet by mouth at bedtime.        . fluticasone (FLONASE) 50 MCG/ACT nasal spray Use as needed       . hydrochlorothiazide (HYDRODIURIL) 12.5 MG tablet Take 12.5 mg by mouth daily.        Marland Kitchen. levalbuterol (XOPENEX) 0.63  MG/3ML nebulizer solution Take 1 ampule by nebulization every 4 (four) hours as needed.      . loratadine (CLARITIN) 10 MG tablet Take 1 tablet (10 mg total) by mouth daily.  30 tablet  11  . methimazole (TAPAZOLE) 10 MG tablet Take 1 tablet (10 mg total) by mouth daily.  90 tablet  1  . metoprolol (TOPROL-XL) 50 MG 24 hr tablet Take 50 mg by mouth daily.        . Mometasone Furo-Formoterol Fum (DULERA) 200-5 MCG/ACT AERO Inhale 2 puffs into the lungs 2 (two) times daily.      . montelukast (SINGULAIR) 10 MG tablet Take 10 mg by mouth at bedtime.      . Multiple Vitamin (MULTIVITAMIN) tablet Take 1 tablet by mouth daily.        . potassium chloride (KLOR-CON) 8 MEQ CR tablet Take 8 mEq by mouth daily.        Marland Kitchen. telmisartan (MICARDIS) 40 MG tablet Take 1/2 tablet by mouth once daily        No current facility-administered medications on file  prior to visit.    Allergies  Allergen Reactions  . Codeine   . Meperidine Hcl     (Demerol)  . Nabumetone     (Relafen)  . Naproxen     REACTION: itching  . Sulfonamide Derivatives     Family History  Problem Relation Age of Onset  . Heart disease Other     BP 110/64  Pulse 81  Temp(Src) 97.9 F (36.6 C) (Oral)  Ht 5\' 3"  (1.6 m)  Wt 137 lb (62.143 kg)  BMI 24.27 kg/m2  SpO2 94%  Review of Systems Denies fever    Objective:   Physical Exam VITAL SIGNS:  See vs page GENERAL: no distress Skin: not diaphoretic. Neuro: no tremor.     Lab Results  Component Value Date   TSH 1.89 10/11/2013      Assessment & Plan:  Multinodular goiter, which is usually hereditary. Hyperthyroidism, due to the goiter, well-controlled

## 2013-11-22 ENCOUNTER — Other Ambulatory Visit: Payer: Self-pay | Admitting: Family Medicine

## 2013-11-22 DIAGNOSIS — R221 Localized swelling, mass and lump, neck: Secondary | ICD-10-CM

## 2013-11-24 ENCOUNTER — Ambulatory Visit
Admission: RE | Admit: 2013-11-24 | Discharge: 2013-11-24 | Disposition: A | Discharge status: Home / Self Care | Payer: Medicare Other | Source: Ambulatory Visit | Attending: Family Medicine | Admitting: Family Medicine

## 2013-11-24 DIAGNOSIS — R221 Localized swelling, mass and lump, neck: Secondary | ICD-10-CM

## 2014-04-20 ENCOUNTER — Encounter: Payer: Self-pay | Admitting: Emergency Medicine

## 2014-04-20 ENCOUNTER — Ambulatory Visit (INDEPENDENT_AMBULATORY_CARE_PROVIDER_SITE_OTHER): Payer: Medicare Other | Admitting: Emergency Medicine

## 2014-04-20 VITALS — BP 102/62 | HR 68 | Temp 97.0°F | Ht 63.0 in | Wt 137.0 lb

## 2014-04-20 DIAGNOSIS — J45909 Unspecified asthma, uncomplicated: Secondary | ICD-10-CM

## 2014-04-20 MED ORDER — ALBUTEROL SULFATE HFA 108 (90 BASE) MCG/ACT IN AERS: 2.0000 | INHALATION_SPRAY | Freq: Four times a day (QID) | RESPIRATORY_TRACT | Status: DC | PRN

## 2014-04-20 NOTE — Patient Instructions (Signed)
Please continue your current medications as you are taking them  Follow with Dr Delton CoombesByrum in 6 months or sooner if you have any problems Get the Flu Shot this fall

## 2014-04-20 NOTE — Assessment & Plan Note (Signed)
Stable on current regimen (although on pred for rash).  - continue same BD regimen and allergy meds.  - rov 6

## 2014-04-20 NOTE — Progress Notes (Signed)
  Subjective:    Patient ID: Ariel SpannerElizabeth A Greenhalgh, female    DOB: [birth year], 78 y.o.   MRN: [id number]  HPI 78 yo never smoker, hx HTN, hypothyroidism, CVA. Asthma dx in 2007 after a hospitalization. 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 She has allergic rhinitis and drainage, currently on zyrtec and fluticasone spray. She has had at least two hospitalizations for breathing. She reports congestion, flares consist of dyspnea, cough, wheeze. Remains very active unless flaring. She has been on Advair in the past, currently on Dulera via spacer.    ROV 08/16/12 -- f/u visit for asthma, allergies and rhinitis, cough. PFT today confirm mild AFL (FEV1 2.29L, 126% predicted). Currently on Dulera 200, (she does have some "jitteriness).  She had 3 AE this year. She is on singulair and fluticasone qd. She tells me that she is also on Spiriva! Has been on it all along but didn't tell me. Currently clinically stable.   ROV 03/16/13 -- Hx of asthma, allergies and rhinitis, cough. Last time we stopped Spiriva, continued dulera 200.  She is on fluticasone, singulair. She occasionally takes loratadine. She has done well off spiriva. No exacerbations.   ROV 10/11/13 -- Hx of asthma, allergies and rhinitis, cough. She has been doing well. She had a URI in late December, was subsequently treated for sinus infxn. Did not require pred. She is on Dulera 200, feels that it helps. She has rarely used albuterol. She has chronic rhinitis, is on singulair, loratadine. She is no longer taking fluticasone.   ROV 04/20/14 -- follows for asthma, allergic rhinitis and associated cough. He been doing well since last visit. No exacerbations. She is on singulair, hasn't needed loratadine. 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  She had prevnar 13 in 2010.    PULMONARY FUNCTON TEST 08/16/2012  FVC 3.31  FEV1 2.29  FEV1/FVC 69.2  FVC  % Predicted 127  FEV % Predicted 126  FeF 25-75 1.44   FeF 25-75 % Predicted 2.11       Objective:   Physical Exam Filed Vitals:   04/20/14 1545  BP: 102/62  Pulse: 68  Temp: 97 F (36.1 C)  TempSrc: Oral  Height: 5\' 3"  (1.6 m)  Weight: 137 lb (62.143 kg)  SpO2: 94%   Gen: Pleasant, thin, in no distress,  normal affect  ENT: No lesions,  mouth clear,  oropharynx clear, no postnasal drip  Neck: No JVD, no TMG, no carotid bruits  Lungs: No use of accessory muscles, clear without rales or rhonchi  Cardiovascular: RRR, heart sounds normal, no murmur or gallops, no peripheral edema  Musculoskeletal: No deformities, no cyanosis or clubbing  Neuro: alert, non focal  Skin: Warm, no lesions or rashes      Assessment & Plan:  ASTHMA Stable on current regimen (although on pred for rash).  - continue same BD regimen and allergy meds.  - rov 6

## 2014-04-24 ENCOUNTER — Ambulatory Visit: Payer: 59 | Admitting: Endocrinology

## 2014-04-24 DIAGNOSIS — Z0289 Encounter for other administrative examinations: Secondary | ICD-10-CM

## 2014-05-08 ENCOUNTER — Other Ambulatory Visit: Payer: Self-pay | Admitting: Endocrinology

## 2014-06-03 ENCOUNTER — Other Ambulatory Visit: Payer: Self-pay | Admitting: Endocrinology

## 2014-06-28 ENCOUNTER — Ambulatory Visit (INDEPENDENT_AMBULATORY_CARE_PROVIDER_SITE_OTHER): Payer: Medicare Other | Admitting: Endocrinology

## 2014-06-28 ENCOUNTER — Encounter: Payer: Self-pay | Admitting: Endocrinology

## 2014-06-28 VITALS — BP 114/82 | HR 66 | Temp 97.5°F | Wt 137.0 lb

## 2014-06-28 DIAGNOSIS — E0520 Thyrotoxicosis with toxic multinodular goiter without thyrotoxic crisis or storm: Secondary | ICD-10-CM

## 2014-06-28 LAB — TSH: TSH: 1.04 u[IU]/mL (ref 0.35–4.50)

## 2014-06-28 NOTE — Progress Notes (Signed)
Subjective:    Patient ID: Ariel Lambert, female    DOB: Jun 26, 1936, [age] y.o.   MRN: [id number]  HPI Pt returns for f/u of hyperthyroidism due to small multinodular goiter (dx'ed approx 1999; she chose chronic tapazole rx; most recent US was in 2015--no change).  It has been well-controlled with tapazole.  pt states she feels well in general.   She has 5 months of moderate rash on the legs, and assoc itching Past Medical History  Diagnosis Date  . OSTEOPOROSIS 04/30/2007  . ASTHMA 05/03/2009  . HYPERTENSION 04/30/2007  . HYPERTHYROIDISM 06/17/2007  . Multinodular goiter   . Menopause   . Dyslipidemia   . CVA (cerebral infarction)     Past Surgical History  Procedure Laterality Date  . Other surgical history      discectomy  . Carotid artery surgery    . Neck surgery    . Back surgery      History   Social History  . Marital Status: Widowed    Spouse Name: N/A    Number of Children: N/A  . Years of Education: N/A   Occupational History  . Teacher    Social History Main Topics  . Smoking status: Former Games developermoker  . Smokeless tobacco: Never Used  . Alcohol Use: Yes     Comment: Socially  . Drug Use: No  . Sexual Activity: Not on file   Other Topics Concern  . Not on file   Social History Narrative   Lives alone.    Current Outpatient Prescriptions on File Prior to Visit  Medication Sig Dispense Refill  . albuterol (PROVENTIL HFA;VENTOLIN HFA) 108 (90 BASE) MCG/ACT inhaler Inhale 2 puffs into the lungs every 6 (six) hours as needed.  1 Inhaler  6  . aspirin 325 MG tablet Take 325 mg by mouth daily.        Marland Kitchen. b complex vitamins tablet Take 1 tablet by mouth daily.        . Calcium Carbonate-Vitamin D (CALCIUM 500 + D PO) Take 1 tablet by mouth 2 (two) times daily.       Marland Kitchen. ezetimibe-simvastatin (VYTORIN) 10-20 MG per tablet Take 1 tablet by mouth at bedtime.        . fluticasone (FLONASE) 50 MCG/ACT nasal spray Use as needed       . hydrochlorothiazide  (HYDRODIURIL) 12.5 MG tablet Take 12.5 mg by mouth daily.        Marland Kitchen. levalbuterol (XOPENEX) 0.63 MG/3ML nebulizer solution Take 1 ampule by nebulization every 4 (four) hours as needed.      . loratadine (CLARITIN) 10 MG tablet Take 1 tablet (10 mg total) by mouth daily.  30 tablet  11  . methimazole (TAPAZOLE) 10 MG tablet Take 1 tablet (10 mg total) by mouth daily.  90 tablet  1  . methimazole (TAPAZOLE) 10 MG tablet TAKE 1 TABLET BY MOUTH ONCE DAILY  30 tablet  0  . metoprolol (TOPROL-XL) 50 MG 24 hr tablet Take 50 mg by mouth daily.        . Mometasone Furo-Formoterol Fum (DULERA) 200-5 MCG/ACT AERO Inhale 2 puffs into the lungs 2 (two) times daily.      . montelukast (SINGULAIR) 10 MG tablet Take 10 mg by mouth at bedtime.      . Multiple Vitamin (MULTIVITAMIN) tablet Take 1 tablet by mouth daily.        . potassium chloride (KLOR-CON) 8 MEQ CR tablet Take 8 mEq by mouth  daily.        . telmisartan (MICARDIS) 40 MG tablet Take 1/2 tablet by mouth once daily        No current facility-administered medications on file prior to visit.    Allergies  Allergen Reactions  . Codeine   . Meperidine Hcl     (Demerol)  . Nabumetone     (Relafen)  . Naproxen     REACTION: itching  . Sulfonamide Derivatives     Family History  Problem Relation Age of Onset  . Heart disease Other     BP 114/82  Pulse 66  Temp(Src) 97.5 F (36.4 C) (Oral)  Wt 137 lb (62.143 kg)  SpO2 95%   Review of Systems Denies fever and open ulcer    Objective:   Physical Exam VITAL SIGNS:  See vs page GENERAL: no distress Skin: anterior tibial areas: moderate eczematous rash (L>R).  No edema. Oe hyperpigmentation.   Lab Results  Component Value Date   TSH 1.04 06/28/2014       Assessment & Plan:  Hyperthyroidism: well-controlled: Please continue the same medications Rash, new to me: we'll check thyroid antibodies.

## 2014-06-28 NOTE — Patient Instructions (Addendum)
blood tests are being requested for you today.  We'll contact you with results. We are also checking the blood test, to see if the rash might be due to the thyroid. Please come back for a follow-up appointment in 6 months

## 2014-06-29 LAB — THYROID PEROXIDASE ANTIBODY: Thyroperoxidase Ab SerPl-aCnc: 1 IU/mL (ref <9)

## 2014-07-03 ENCOUNTER — Other Ambulatory Visit: Payer: Self-pay | Admitting: Endocrinology

## 2014-07-04 LAB — THYROID STIMULATING IMMUNOGLOBULIN: TSI: 35 % baseline (ref <140)

## 2014-07-11 ENCOUNTER — Other Ambulatory Visit: Payer: Self-pay

## 2014-07-31 ENCOUNTER — Other Ambulatory Visit: Payer: Self-pay | Admitting: Endocrinology

## 2014-08-26 ENCOUNTER — Other Ambulatory Visit: Payer: Self-pay | Admitting: Endocrinology

## 2014-09-24 ENCOUNTER — Other Ambulatory Visit: Payer: Self-pay | Admitting: Endocrinology

## 2014-10-26 ENCOUNTER — Other Ambulatory Visit: Payer: Self-pay | Admitting: Endocrinology

## 2014-11-20 ENCOUNTER — Other Ambulatory Visit: Payer: Self-pay

## 2014-11-20 DIAGNOSIS — Z1231 Encounter for screening mammogram for malignant neoplasm of breast: Secondary | ICD-10-CM

## 2014-11-26 ENCOUNTER — Other Ambulatory Visit: Payer: Self-pay | Admitting: Endocrinology

## 2014-11-29 ENCOUNTER — Ambulatory Visit
Admission: RE | Admit: 2014-11-29 | Discharge: 2014-11-29 | Disposition: A | Discharge status: Home / Self Care | Payer: Medicare Other | Source: Ambulatory Visit

## 2014-11-29 DIAGNOSIS — Z1231 Encounter for screening mammogram for malignant neoplasm of breast: Secondary | ICD-10-CM

## 2014-12-24 ENCOUNTER — Other Ambulatory Visit: Payer: Self-pay | Admitting: Endocrinology

## 2014-12-27 ENCOUNTER — Encounter: Payer: Self-pay | Admitting: Endocrinology

## 2014-12-27 ENCOUNTER — Ambulatory Visit (INDEPENDENT_AMBULATORY_CARE_PROVIDER_SITE_OTHER): Payer: Medicare Other | Admitting: Endocrinology

## 2014-12-27 VITALS — BP 118/70 | HR 70 | Temp 97.8°F | Ht 63.0 in | Wt 136.0 lb

## 2014-12-27 DIAGNOSIS — E0520 Thyrotoxicosis with toxic multinodular goiter without thyrotoxic crisis or storm: Secondary | ICD-10-CM | POA: Diagnosis not present

## 2014-12-27 LAB — T4, FREE: Free T4: 0.95 ng/dL (ref 0.60–1.60)

## 2014-12-27 LAB — TSH: TSH: 1.81 u[IU]/mL (ref 0.35–4.50)

## 2014-12-27 NOTE — Patient Instructions (Addendum)
blood tests are being requested for you today.  We'll contact you with results. Please come back for a follow-up appointment in 6 months  

## 2014-12-27 NOTE — Progress Notes (Signed)
Subjective:    Patient ID: Elon SpannerElizabeth A Noreen, female    DOB: November 21, 1935, 79 y.o.   MRN: [id number]  HPI Pt returns for f/u of hyperthyroidism due to small multinodular goiter (dx'ed approx 1999; she chose chronic tapazole rx; most recent US was in 2015--no change).  It has been well-controlled with tapazole.  pt states she feels well in general.   pt states she feels well in general, except for excessive diaphoresis.   Review of Systems Denies fever   Subjective:    Patient ID: Elon SpannerElizabeth A Kydd, female    DOB: November 21, 1935, 79 y.o.   MRN: [id number]  Past Medical History  Diagnosis Date  . OSTEOPOROSIS 04/30/2007  . ASTHMA 05/03/2009  . HYPERTENSION 04/30/2007  . HYPERTHYROIDISM 06/17/2007  . Multinodular goiter   . Menopause   . Dyslipidemia   . CVA (cerebral infarction)     Past Surgical History  Procedure Laterality Date  . Other surgical history      discectomy  . Carotid artery surgery    . Neck surgery    . Back surgery      History   Social History  . Marital Status: Widowed    Spouse Name: N/A  . Number of Children: N/A  . Years of Education: N/A   Occupational History  . Teacher    Social History Main Topics  . Smoking status: Former Games developermoker  . Smokeless tobacco: Never Used  . Alcohol Use: Yes     Comment: Socially  . Drug Use: No  . Sexual Activity: Not on file   Other Topics Concern  . Not on file   Social History Narrative   Lives alone.    Current Outpatient Prescriptions on File Prior to Visit  Medication Sig Dispense Refill  . albuterol (PROVENTIL HFA;VENTOLIN HFA) 108 (90 BASE) MCG/ACT inhaler Inhale 2 puffs into the lungs every 6 (six) hours as needed. 1 Inhaler 6  . aspirin 325 MG tablet Take 325 mg by mouth daily.      Marland Kitchen. b complex vitamins tablet Take 1 tablet by mouth daily.      . Calcium Carbonate-Vitamin D (CALCIUM 500 + D PO) Take 1 tablet by mouth 2 (two) times daily.     Marland Kitchen. ezetimibe-simvastatin (VYTORIN) 10-20 MG  per tablet Take 1 tablet by mouth at bedtime.      . fluticasone (FLONASE) 50 MCG/ACT nasal spray Use as needed     . hydrochlorothiazide (HYDRODIURIL) 12.5 MG tablet Take 12.5 mg by mouth daily.      Marland Kitchen. levalbuterol (XOPENEX) 0.63 MG/3ML nebulizer solution Take 1 ampule by nebulization every 4 (four) hours as needed.    . loratadine (CLARITIN) 10 MG tablet Take 1 tablet (10 mg total) by mouth daily. 30 tablet 11  . methimazole (TAPAZOLE) 10 MG tablet TAKE 1 TABLET BY MOUTH EVERY DAY 30 tablet 0  . metoprolol (TOPROL-XL) 50 MG 24 hr tablet Take 50 mg by mouth daily.      . Mometasone Furo-Formoterol Fum (DULERA) 200-5 MCG/ACT AERO Inhale 2 puffs into the lungs 2 (two) times daily.    . montelukast (SINGULAIR) 10 MG tablet Take 10 mg by mouth at bedtime.    . Multiple Vitamin (MULTIVITAMIN) tablet Take 1 tablet by mouth daily.      . potassium chloride (KLOR-CON) 8 MEQ CR tablet Take 8 mEq by mouth daily.      Marland Kitchen. telmisartan (MICARDIS) 40 MG tablet Take 1/2 tablet by mouth once daily  No current facility-administered medications on file prior to visit.    Allergies  Allergen Reactions  . Codeine   . Meperidine Hcl     (Demerol)  . Nabumetone     (Relafen)  . Naproxen     REACTION: itching  . Sulfonamide Derivatives     Family History  Problem Relation Age of Onset  . Heart disease Other     BP 118/70 mmHg  Pulse 70  Temp(Src) 97.8 F (36.6 C) (Oral)  Ht  (1.6 m)  Wt 136 lb (61.689 kg)  BMI 24.10 kg/m2  SpO2 93%     Objective:   Physical Exam VITAL SIGNS:  See vs page GENERAL: no distress NECK: slightly enlarged thyroid, with multinodular surface       Assessment & Plan:  Hyperthyroidism: well-controlled: Please continue the same medication    Objective:   Physical Exam VITAL SIGNS:  See vs page GENERAL: no distress NECK: There is no palpable thyroid enlargement.  No thyroid nodule is palpable.  No palpable lymphadenopathy at the anterior neck.   Lab  Results  Component Value Date   TSH 1.81 12/27/2014         Hyperthyroidism: well-controlled: Please continue the same medication Please come back for a follow-up appointment in 6 months.

## 2014-12-29 ENCOUNTER — Other Ambulatory Visit: Payer: Self-pay

## 2015-01-21 ENCOUNTER — Other Ambulatory Visit: Payer: Self-pay | Admitting: Endocrinology

## 2015-02-20 ENCOUNTER — Other Ambulatory Visit: Payer: Self-pay | Admitting: Endocrinology

## 2015-03-20 ENCOUNTER — Other Ambulatory Visit: Payer: Self-pay | Admitting: Endocrinology

## 2015-03-22 ENCOUNTER — Ambulatory Visit
Admission: RE | Admit: 2015-03-22 | Discharge: 2015-03-22 | Disposition: A | Discharge status: Home / Self Care | Payer: Medicare Other | Source: Ambulatory Visit | Attending: Family Medicine | Admitting: Family Medicine

## 2015-03-22 ENCOUNTER — Other Ambulatory Visit: Payer: Self-pay | Admitting: Family Medicine

## 2015-03-22 DIAGNOSIS — M25571 Pain in right ankle and joints of right foot: Secondary | ICD-10-CM

## 2015-04-16 ENCOUNTER — Other Ambulatory Visit: Payer: Self-pay | Admitting: Endocrinology

## 2015-06-29 ENCOUNTER — Ambulatory Visit (INDEPENDENT_AMBULATORY_CARE_PROVIDER_SITE_OTHER): Payer: Medicare Other | Admitting: Endocrinology

## 2015-06-29 ENCOUNTER — Encounter: Payer: Self-pay | Admitting: Endocrinology

## 2015-06-29 VITALS — BP 128/86 | HR 89 | Temp 98.0°F | Ht 63.0 in | Wt 139.0 lb

## 2015-06-29 DIAGNOSIS — E0520 Thyrotoxicosis with toxic multinodular goiter without thyrotoxic crisis or storm: Secondary | ICD-10-CM

## 2015-06-29 LAB — T4, FREE: Free T4: 3.81 ng/dL — ABNORMAL HIGH (ref 0.60–1.60)

## 2015-06-29 LAB — TSH: TSH: 3.61 u[IU]/mL (ref 0.35–4.50)

## 2015-06-29 NOTE — Patient Instructions (Addendum)
blood tests are being requested for you today.  We'll contact you with results.   Please come back for a follow-up appointment in 8 months.   if ever you have fever while taking methimazole, stop it and call us, because of the risk of a rare side-effect.

## 2015-06-29 NOTE — Progress Notes (Signed)
Subjective:    Patient ID: Ariel Lambert, female    DOB: [birth year], 79 y.o.   MRN: [id number]  HPI Pt returns for f/u of hyperthyroidism due to small multinodular goiter (dx'ed approx 1999; she chose chronic tapazole rx; most recent US was in 2015--no change).  It has been well-controlled with tapazole.  pt states she feels well in general.   Past Medical History  Diagnosis Date  . OSTEOPOROSIS 04/30/2007  . ASTHMA 05/03/2009  . HYPERTENSION 04/30/2007  . HYPERTHYROIDISM 06/17/2007  . Multinodular goiter   . Menopause   . Dyslipidemia   . CVA (cerebral infarction)     Past Surgical History  Procedure Laterality Date  . Other surgical history      discectomy  . Carotid artery surgery    . Neck surgery    . Back surgery      Social History   Social History  . Marital Status: Widowed    Spouse Name: N/A  . Number of Children: N/A  . Years of Education: N/A   Occupational History  . Teacher    Social History Main Topics  . Smoking status: Former Games developer  . Smokeless tobacco: Never Used  . Alcohol Use: Yes     Comment: Socially  . Drug Use: No  . Sexual Activity: Not on file   Other Topics Concern  . Not on file   Social History Narrative   Lives alone.    Current Outpatient Prescriptions on File Prior to Visit  Medication Sig Dispense Refill  . albuterol (PROVENTIL HFA;VENTOLIN HFA) 108 (90 BASE) MCG/ACT inhaler Inhale 2 puffs into the lungs every 6 (six) hours as needed. 1 Inhaler 6  . ALPRAZolam (XANAX) 0.25 MG tablet Take 0.25 mg by mouth at bedtime as needed for anxiety.    Marland Kitchen aspirin 325 MG tablet Take 325 mg by mouth daily.      Marland Kitchen b complex vitamins tablet Take 1 tablet by mouth daily.      . budesonide-formoterol (SYMBICORT) 80-4.5 MCG/ACT inhaler Inhale 2 puffs into the lungs 2 (two) times daily.    . Calcium Carbonate-Vitamin D (CALCIUM 500 + D PO) Take 1 tablet by mouth 2 (two) times daily.     Marland Kitchen ezetimibe-simvastatin (VYTORIN) 10-20 MG per  tablet Take 1 tablet by mouth at bedtime.      . fluticasone (FLONASE) 50 MCG/ACT nasal spray Use as needed     . hydrochlorothiazide (HYDRODIURIL) 12.5 MG tablet Take 12.5 mg by mouth daily.      Marland Kitchen levalbuterol (XOPENEX) 0.63 MG/3ML nebulizer solution Take 1 ampule by nebulization every 4 (four) hours as needed.    . loratadine (CLARITIN) 10 MG tablet Take 1 tablet (10 mg total) by mouth daily. 30 tablet 11  . methimazole (TAPAZOLE) 10 MG tablet TAKE 1 TABLET EVERY DAY 30 tablet 3  . metoprolol (TOPROL-XL) 50 MG 24 hr tablet Take 50 mg by mouth daily.      . Mometasone Furo-Formoterol Fum (DULERA) 200-5 MCG/ACT AERO Inhale 2 puffs into the lungs 2 (two) times daily.    . montelukast (SINGULAIR) 10 MG tablet Take 10 mg by mouth at bedtime.    . Multiple Vitamin (MULTIVITAMIN) tablet Take 1 tablet by mouth daily.      . potassium chloride (KLOR-CON) 8 MEQ CR tablet Take 8 mEq by mouth daily.      Marland Kitchen telmisartan (MICARDIS) 40 MG tablet Take 1/2 tablet by mouth once daily      No  current facility-administered medications on file prior to visit.    Allergies  Allergen Reactions  . Codeine   . Meperidine   . Meperidine Hcl     (Demerol)  . Sulfonamide Derivatives   . Nabumetone Rash    (Relafen)  . Naproxen Rash    REACTION: itching  . Sulfa Antibiotics Rash    Family History  Problem Relation Age of Onset  . Heart disease Other     BP 128/86 mmHg  Pulse 89  Temp(Src) 98 F (36.7 C) (Oral)  Ht 5\' 3"  (1.6 m)  Wt 139 lb (63.05 kg)  BMI 24.63 kg/m2  SpO2 96%  Review of Systems Denies fever    Objective:   Physical Exam VITAL SIGNS:  See vs page.  GENERAL: no distress. NECK: slightly enlarged thyroid, with multinodular surface.     Lab Results  Component Value Date   TSH 3.61 06/29/2015  Free T4 is high    Assessment & Plan:  Hyperthyroxinemia, despite tapazole, new, uncertain etiology.    Patient is advised the following: Patient Instructions  blood tests are  being requested for you today.  We'll contact you with results.   Please come back for a follow-up appointment in 8 months.   if ever you have fever while taking methimazole, stop it and call us, because of the risk of a rare side-effect.    addendum: recheck TFT

## 2015-07-10 ENCOUNTER — Other Ambulatory Visit: Payer: Self-pay

## 2015-07-10 ENCOUNTER — Other Ambulatory Visit (INDEPENDENT_AMBULATORY_CARE_PROVIDER_SITE_OTHER): Payer: Medicare Other

## 2015-07-10 DIAGNOSIS — E042 Nontoxic multinodular goiter: Secondary | ICD-10-CM | POA: Diagnosis not present

## 2015-07-10 LAB — TSH: TSH: 5.74 u[IU]/mL — ABNORMAL HIGH (ref 0.35–4.50)

## 2015-07-10 LAB — T4, FREE: Free T4: 0.92 ng/dL (ref 0.60–1.60)

## 2015-07-10 MED ORDER — METHIMAZOLE 10 MG PO TABS: 10.0000 mg | ORAL_TABLET | Freq: Every day | ORAL | Status: DC

## 2015-09-02 ENCOUNTER — Emergency Department (HOSPITAL_COMMUNITY): Payer: Medicare Other

## 2015-09-02 ENCOUNTER — Inpatient Hospital Stay (HOSPITAL_COMMUNITY)
Admission: EM | Admit: 2015-09-02 | Discharge: 2015-09-07 | DRG: 202 | Disposition: A | Discharge status: Home / Self Care | Payer: Medicare Other | Attending: Family Medicine | Admitting: Family Medicine

## 2015-09-02 ENCOUNTER — Encounter (HOSPITAL_COMMUNITY): Payer: Self-pay | Admitting: Emergency Medicine

## 2015-09-02 DIAGNOSIS — Z87891 Personal history of nicotine dependence: Secondary | ICD-10-CM | POA: Diagnosis not present

## 2015-09-02 DIAGNOSIS — Z66 Do not resuscitate: Secondary | ICD-10-CM | POA: Diagnosis present

## 2015-09-02 DIAGNOSIS — E785 Hyperlipidemia, unspecified: Secondary | ICD-10-CM | POA: Diagnosis present

## 2015-09-02 DIAGNOSIS — Z7982 Long term (current) use of aspirin: Secondary | ICD-10-CM

## 2015-09-02 DIAGNOSIS — J441 Chronic obstructive pulmonary disease with (acute) exacerbation: Secondary | ICD-10-CM

## 2015-09-02 DIAGNOSIS — Z8673 Personal history of transient ischemic attack (TIA), and cerebral infarction without residual deficits: Secondary | ICD-10-CM

## 2015-09-02 DIAGNOSIS — I509 Heart failure, unspecified: Secondary | ICD-10-CM | POA: Diagnosis present

## 2015-09-02 DIAGNOSIS — I10 Essential (primary) hypertension: Secondary | ICD-10-CM

## 2015-09-02 DIAGNOSIS — F419 Anxiety disorder, unspecified: Secondary | ICD-10-CM | POA: Diagnosis present

## 2015-09-02 DIAGNOSIS — J9601 Acute respiratory failure with hypoxia: Secondary | ICD-10-CM | POA: Diagnosis present

## 2015-09-02 DIAGNOSIS — J45901 Unspecified asthma with (acute) exacerbation: Secondary | ICD-10-CM | POA: Diagnosis present

## 2015-09-02 DIAGNOSIS — R0902 Hypoxemia: Secondary | ICD-10-CM | POA: Diagnosis not present

## 2015-09-02 DIAGNOSIS — E039 Hypothyroidism, unspecified: Secondary | ICD-10-CM | POA: Diagnosis present

## 2015-09-02 DIAGNOSIS — E0590 Thyrotoxicosis, unspecified without thyrotoxic crisis or storm: Secondary | ICD-10-CM | POA: Diagnosis present

## 2015-09-02 DIAGNOSIS — J189 Pneumonia, unspecified organism: Secondary | ICD-10-CM

## 2015-09-02 LAB — COMPREHENSIVE METABOLIC PANEL
ALT: 12 U/L — ABNORMAL LOW (ref 14–54)
AST: 12 U/L — ABNORMAL LOW (ref 15–41)
Albumin: 2.6 g/dL — ABNORMAL LOW (ref 3.5–5.0)
Alkaline Phosphatase: 108 U/L (ref 38–126)
Anion gap: 8 (ref 5–15)
BUN: 10 mg/dL (ref 6–20)
CO2: 25 mmol/L (ref 22–32)
Calcium: 8.2 mg/dL — ABNORMAL LOW (ref 8.9–10.3)
Chloride: 105 mmol/L (ref 101–111)
Creatinine, Ser: 0.56 mg/dL (ref 0.44–1.00)
GFR calc Af Amer: >60 mL/min (ref >60)
GFR calc non Af Amer: >60 mL/min (ref >60)
Glucose, Bld: 150 mg/dL — ABNORMAL HIGH (ref 65–99)
Potassium: 3.6 mmol/L (ref 3.5–5.1)
Sodium: 138 mmol/L (ref 135–145)
Total Bilirubin: 0.4 mg/dL (ref 0.3–1.2)
Total Protein: 5.6 g/dL — ABNORMAL LOW (ref 6.5–8.1)

## 2015-09-02 LAB — I-STAT TROPONIN, ED: Troponin i, poc: 0.01 ng/mL (ref 0.00–0.08)

## 2015-09-02 LAB — CBC WITH DIFFERENTIAL/PLATELET
Basophils Absolute: 0 10*3/uL (ref 0.0–0.1)
Basophils Relative: 0 %
Eosinophils Absolute: 0.1 10*3/uL (ref 0.0–0.7)
Eosinophils Relative: 1 %
HCT: 38.4 % (ref 36.0–46.0)
Hemoglobin: 12.5 g/dL (ref 12.0–15.0)
Lymphocytes Relative: 6 %
Lymphs Abs: 0.4 10*3/uL — ABNORMAL LOW (ref 0.7–4.0)
MCH: 30.6 pg (ref 26.0–34.0)
MCHC: 32.6 g/dL (ref 30.0–36.0)
MCV: 93.9 fL (ref 78.0–100.0)
Monocytes Absolute: 0.2 10*3/uL (ref 0.1–1.0)
Monocytes Relative: 4 %
Neutro Abs: 5.1 10*3/uL (ref 1.7–7.7)
Neutrophils Relative %: 89 %
Platelets: 187 10*3/uL (ref 150–400)
RBC: 4.09 MIL/uL (ref 3.87–5.11)
RDW: 15.9 % — ABNORMAL HIGH (ref 11.5–15.5)
WBC: 5.8 10*3/uL (ref 4.0–10.5)

## 2015-09-02 LAB — BRAIN NATRIURETIC PEPTIDE: B Natriuretic Peptide: 202 pg/mL — ABNORMAL HIGH (ref 0.0–100.0)

## 2015-09-02 MED ORDER — DIPHENHYDRAMINE HCL 25 MG PO TABS: 25.0000 mg | ORAL_TABLET | Freq: Every evening | ORAL | Status: DC | PRN

## 2015-09-02 MED ORDER — EZETIMIBE-SIMVASTATIN 10-20 MG PO TABS
1.0000 | ORAL_TABLET | Freq: Every day | ORAL | Status: DC
  Administered 2015-09-02 – 2015-09-06 (×5): 1 via ORAL
  Filled 2015-09-02 (×6): qty 1

## 2015-09-02 MED ORDER — IPRATROPIUM-ALBUTEROL 0.5-2.5 (3) MG/3ML IN SOLN
3.0000 mL | RESPIRATORY_TRACT | Status: DC | PRN
  Administered 2015-09-05: 3 mL via RESPIRATORY_TRACT
  Filled 2015-09-02: qty 3

## 2015-09-02 MED ORDER — ONDANSETRON HCL 4 MG/2ML IJ SOLN: 4.0000 mg | Freq: Four times a day (QID) | INTRAMUSCULAR | Status: DC | PRN

## 2015-09-02 MED ORDER — FERROUS SULFATE 325 (65 FE) MG PO TABS
325.0000 mg | ORAL_TABLET | Freq: Every day | ORAL | Status: DC
  Administered 2015-09-02 – 2015-09-07 (×6): 325 mg via ORAL
  Filled 2015-09-02 (×6): qty 1

## 2015-09-02 MED ORDER — IPRATROPIUM-ALBUTEROL 0.5-2.5 (3) MG/3ML IN SOLN
3.0000 mL | Freq: Once | RESPIRATORY_TRACT | Status: AC
  Administered 2015-09-02: 3 mL via RESPIRATORY_TRACT

## 2015-09-02 MED ORDER — IRBESARTAN 75 MG PO TABS
37.5000 mg | ORAL_TABLET | Freq: Every day | ORAL | Status: DC
  Administered 2015-09-02 – 2015-09-07 (×6): 37.5 mg via ORAL
  Filled 2015-09-02 (×6): qty 0.5

## 2015-09-02 MED ORDER — GUAIFENESIN ER 600 MG PO TB12
600.0000 mg | ORAL_TABLET | Freq: Two times a day (BID) | ORAL | Status: DC
  Administered 2015-09-02 – 2015-09-07 (×10): 600 mg via ORAL
  Filled 2015-09-02 (×10): qty 1

## 2015-09-02 MED ORDER — ASPIRIN EC 81 MG PO TBEC
81.0000 mg | DELAYED_RELEASE_TABLET | Freq: Every day | ORAL | Status: DC
  Administered 2015-09-02 – 2015-09-07 (×6): 81 mg via ORAL
  Filled 2015-09-02 (×5): qty 1

## 2015-09-02 MED ORDER — ACETAMINOPHEN 325 MG PO TABS
650.0000 mg | ORAL_TABLET | Freq: Four times a day (QID) | ORAL | Status: DC | PRN
  Administered 2015-09-02 – 2015-09-03 (×2): 650 mg via ORAL
  Filled 2015-09-02 (×3): qty 2

## 2015-09-02 MED ORDER — METHYLPREDNISOLONE SODIUM SUCC 125 MG IJ SOLR
60.0000 mg | Freq: Two times a day (BID) | INTRAMUSCULAR | Status: DC
  Administered 2015-09-02 – 2015-09-03 (×2): 60 mg via INTRAVENOUS
  Filled 2015-09-02 (×2): qty 2

## 2015-09-02 MED ORDER — ADULT MULTIVITAMIN W/MINERALS CH
1.0000 | ORAL_TABLET | Freq: Every day | ORAL | Status: DC
  Administered 2015-09-02 – 2015-09-07 (×6): 1 via ORAL
  Filled 2015-09-02 (×11): qty 1

## 2015-09-02 MED ORDER — MONTELUKAST SODIUM 10 MG PO TABS
10.0000 mg | ORAL_TABLET | Freq: Every day | ORAL | Status: DC
  Administered 2015-09-02 – 2015-09-06 (×5): 10 mg via ORAL
  Filled 2015-09-02 (×5): qty 1

## 2015-09-02 MED ORDER — POTASSIUM CHLORIDE ER 10 MEQ PO TBCR
10.0000 meq | EXTENDED_RELEASE_TABLET | Freq: Every day | ORAL | Status: DC
  Administered 2015-09-02 – 2015-09-03 (×2): 10 meq via ORAL
  Filled 2015-09-02 (×3): qty 1

## 2015-09-02 MED ORDER — LEVOFLOXACIN IN D5W 500 MG/100ML IV SOLN
500.0000 mg | INTRAVENOUS | Status: DC
  Administered 2015-09-03: 500 mg via INTRAVENOUS
  Filled 2015-09-02: qty 100

## 2015-09-02 MED ORDER — BUDESONIDE-FORMOTEROL FUMARATE 160-4.5 MCG/ACT IN AERO
2.0000 | INHALATION_SPRAY | Freq: Two times a day (BID) | RESPIRATORY_TRACT | Status: DC
Start: 2015-09-02 — End: 2015-09-07
  Administered 2015-09-03 – 2015-09-07 (×7): 2 via RESPIRATORY_TRACT
  Filled 2015-09-02 (×2): qty 6

## 2015-09-02 MED ORDER — ALPRAZOLAM 0.25 MG PO TABS
0.2500 mg | ORAL_TABLET | Freq: Every evening | ORAL | Status: DC | PRN
  Administered 2015-09-02 – 2015-09-05 (×3): 0.25 mg via ORAL
  Filled 2015-09-02 (×3): qty 1

## 2015-09-02 MED ORDER — LEVOFLOXACIN IN D5W 750 MG/150ML IV SOLN
750.0000 mg | Freq: Once | INTRAVENOUS | Status: AC
  Administered 2015-09-02: 750 mg via INTRAVENOUS
  Filled 2015-09-02: qty 150

## 2015-09-02 MED ORDER — IPRATROPIUM-ALBUTEROL 0.5-2.5 (3) MG/3ML IN SOLN
3.0000 mL | Freq: Once | RESPIRATORY_TRACT | Status: DC
  Filled 2015-09-02: qty 3

## 2015-09-02 MED ORDER — ACETAMINOPHEN 325 MG PO TABS: 650.0000 mg | ORAL_TABLET | Freq: Four times a day (QID) | ORAL | Status: DC | PRN

## 2015-09-02 MED ORDER — DIPHENHYDRAMINE HCL 25 MG PO CAPS
25.0000 mg | ORAL_CAPSULE | Freq: Every day | ORAL | Status: DC | PRN
  Filled 2015-09-02: qty 1

## 2015-09-02 MED ORDER — IPRATROPIUM-ALBUTEROL 0.5-2.5 (3) MG/3ML IN SOLN
3.0000 mL | Freq: Four times a day (QID) | RESPIRATORY_TRACT | Status: DC
  Administered 2015-09-02 – 2015-09-03 (×3): 3 mL via RESPIRATORY_TRACT
  Filled 2015-09-02 (×2): qty 3

## 2015-09-02 MED ORDER — ACETAMINOPHEN 650 MG RE SUPP: 650.0000 mg | Freq: Four times a day (QID) | RECTAL | Status: DC | PRN

## 2015-09-02 MED ORDER — SODIUM CHLORIDE 0.9 % IV SOLN
Freq: Once | INTRAVENOUS | Status: AC
  Administered 2015-09-02: 16:00:00 via INTRAVENOUS

## 2015-09-02 MED ORDER — ONDANSETRON HCL 4 MG PO TABS: 4.0000 mg | ORAL_TABLET | Freq: Four times a day (QID) | ORAL | Status: DC | PRN

## 2015-09-02 MED ORDER — METOPROLOL SUCCINATE ER 50 MG PO TB24
50.0000 mg | ORAL_TABLET | Freq: Every day | ORAL | Status: DC
Start: 2015-09-02 — End: 2015-09-07
  Administered 2015-09-03 – 2015-09-07 (×5): 50 mg via ORAL
  Filled 2015-09-02 (×5): qty 1

## 2015-09-02 MED ORDER — METHIMAZOLE 10 MG PO TABS
10.0000 mg | ORAL_TABLET | Freq: Every day | ORAL | Status: DC
  Administered 2015-09-02 – 2015-09-07 (×6): 10 mg via ORAL
  Filled 2015-09-02 (×6): qty 1

## 2015-09-02 MED ORDER — ENOXAPARIN SODIUM 40 MG/0.4ML ~~LOC~~ SOLN
40.0000 mg | SUBCUTANEOUS | Status: DC
  Administered 2015-09-02 – 2015-09-06 (×5): 40 mg via SUBCUTANEOUS
  Filled 2015-09-02 (×5): qty 0.4

## 2015-09-02 NOTE — ED Notes (Signed)
Bed: WA08 Expected date: 09/02/15 Expected time: 1:32 PM Means of arrival: Ambulance Comments: Asthma

## 2015-09-02 NOTE — H&P (Addendum)
[facility] History and Physical  ADITRI LOUISCHARLES MVH:[id number] DOB: [birth year] DOA: 09/02/2015  Referring physician: EDP PCP: Emeterio Reeve, MD   Chief Complaint: shortness of breath  HPI: Ariel Lambert is a 79 y.o. female with past medical history of asthma, hypertension, anxiety was in her usual state of health until yesterday. She developed cold-like symptoms with cough congestion runny nose yesterday, subsequently progressed to wheezing and shortness of breath today. Tried using her inhalers at home due to worsening of her symptoms called EMS and was brought to [facility] long emergency room she was felt to be wheezing diffusely and was given Solu-Medrol 125 mg IV on route.  In the ER given multiple nebulizer treatments and she was starting to feel better however she desaturated into the low 80s on ambulation and hence [facility] was consulted for admission. Chest x-ray and EKG unremarkable. She denies any fevers, no body aches, no nausea or vomiting. She received her flu shot this year as well as pneumonia shot within the last few years.  Review of Systems:  positives bolded  Constitutional:  No weight loss, night sweats, Fevers, chills, fatigue.  HEENT:  No headaches, Difficulty swallowing,Tooth/dental problems,Sore throat,  No sneezing, itching, ear ache, nasal congestion, post nasal drip,  Cardio-vascular:  No chest pain, Orthopnea, PND, swelling in lower extremities, anasarca, dizziness, palpitations  GI:  No heartburn, indigestion, abdominal pain, nausea, vomiting, diarrhea, change in bowel habits, loss of appetite  Resp:  No shortness of breath with exertion or at rest. No excess mucus, no productive cough, No non-productive cough, No coughing up of blood.No change in color of mucus.No wheezing.No chest wall deformity  Skin:  no rash or lesions.  GU:  no dysuria, change in color of urine, no urgency or frequency. No flank pain.  Musculoskeletal:  No joint  pain or swelling. No decreased range of motion. No back pain.  Psych:  No change in mood or affect. No depression or anxiety. No memory loss.   Past Medical History  Diagnosis Date  . OSTEOPOROSIS 04/30/2007  . ASTHMA 05/03/2009  . HYPERTENSION 04/30/2007  . HYPERTHYROIDISM 06/17/2007  . Multinodular goiter   . Menopause   . Dyslipidemia   . CVA (cerebral infarction)    Past Surgical History  Procedure Laterality Date  . Other surgical history      discectomy  . Carotid artery surgery    . Neck surgery    . Back surgery     Social History:  reports that she has quit smoking. She has never used smokeless tobacco. She reports that she drinks alcohol. She reports that she does not use illicit drugs.  Allergies  Allergen Reactions  . Cetirizine & Related     unknown  . Codeine   . Meperidine   . Meperidine Hcl     (Demerol)  . Sulfonamide Derivatives   . Nabumetone Rash    (Relafen)  . Naproxen Rash    REACTION: itching  . Sulfa Antibiotics Rash    Family History  Problem Relation Age of Onset  . Heart disease Other     Prior to Admission medications   Medication Sig Start Date End Date Taking? Authorizing Provider  acetaminophen (TYLENOL) 325 MG tablet Take 650 mg by mouth every 6 (six) hours as needed for headache.   Yes Historical Provider, MD  albuterol (PROVENTIL HFA;VENTOLIN HFA) 108 (90 BASE) MCG/ACT inhaler Inhale 2 puffs into the lungs every 6 (six) hours as needed. 04/20/14  Yes Les Pou  Byrum, MD  ALPRAZolam (XANAX) 0.25 MG tablet Take 0.25 mg by mouth at bedtime as needed for anxiety.   Yes Historical Provider, MD  aspirin EC 81 MG tablet Take 81 mg by mouth daily.   Yes Historical Provider, MD  aspirin-acetaminophen-caffeine (EXCEDRIN MIGRAINE) [phone] MG tablet Take 2 tablets by mouth every 6 (six) hours as needed for headache or migraine.   Yes Historical Provider, MD  b complex vitamins tablet Take 1 tablet by mouth daily.     Yes Historical Provider, MD    Calcium Carbonate-Vitamin D (CALCIUM 500 + D PO) Take 1 tablet by mouth 2 (two) times daily.    Yes Historical Provider, MD  diphenhydrAMINE (BENADRYL) 25 MG tablet Take 25 mg by mouth at bedtime as needed for allergies.   Yes Historical Provider, MD  ezetimibe-simvastatin (VYTORIN) 10-20 MG per tablet Take 1 tablet by mouth at bedtime.     Yes Historical Provider, MD  hydrochlorothiazide (HYDRODIURIL) 12.5 MG tablet Take 12.5 mg by mouth daily.     Yes Historical Provider, MD  Iron TABS Take 1 tablet by mouth daily.   Yes Historical Provider, MD  KLOR-CON 10 10 MEQ tablet Take 10 mEq by mouth daily. 08/29/15  Yes Historical Provider, MD  levalbuterol Pauline Aus(XOPENEX) 0.63 MG/3ML nebulizer solution Take 1 ampule by nebulization every 4 (four) hours as needed for wheezing or shortness of breath.    Yes Historical Provider, MD  methimazole (TAPAZOLE) 10 MG tablet Take 1 tablet (10 mg total) by mouth daily. 07/10/15  Yes Romero BellingSean Ellison, MD  metoprolol (TOPROL-XL) 50 MG 24 hr tablet Take 50 mg by mouth daily.     Yes Historical Provider, MD  montelukast (SINGULAIR) 10 MG tablet Take 10 mg by mouth at bedtime.   Yes Historical Provider, MD  Multiple Vitamin (MULTIVITAMIN) tablet Take 1 tablet by mouth daily.     Yes Historical Provider, MD  SYMBICORT 160-4.5 MCG/ACT inhaler Inhale 2 puffs into the lungs 2 (two) times daily. 07/09/15  Yes Historical Provider, MD  telmisartan (MICARDIS) 40 MG tablet Take 1/2 tablet by mouth once daily    Yes Historical Provider, MD  triamcinolone ointment (KENALOG) 0.1 % Apply 1 application topically 2 (two) times daily. 08/16/15  Yes Historical Provider, MD   Physical Exam: Filed Vitals:   09/02/15 1500 09/02/15 1605 09/02/15 1608 09/02/15 1610  BP: 113/69 113/62    Pulse: 77 84 92   Temp:  99.1 F (37.3 C)    TempSrc:      Resp: 22 16    SpO2:   85% 97%    Wt Readings from Last 3 Encounters:  06/29/15 63.05 kg (139 lb)  12/27/14 61.689 kg (136 lb)  06/28/14 62.143  kg (137 lb)    General:  Appears calm and comfortable, no distress  Eyes: PERRL, normal lids, irises & conjunctiva ENT: grossly normal hearing, lips & tongue Neck: no LAD, masses or thyromegaly Cardiovascular: RRR, tachycardic  no m/r/g. No LE edema. Telemetry: SR, no arrhythmias  RespiratoryVery poor air movement, faint expiratory wheezes Abdomen: soft, ntnd, Bs present Skin: no rash or induration seen on limited exam Musculoskeletal: grossly normal tone BUE/BLE Psychiatric: grossly normal mood and affect, speech fluent and appropriate Neurologic: grossly non-focal.          Labs on Admission:  Basic Metabolic Panel:  Recent Labs Lab 09/02/15 1443  NA 138  K 3.6  CL 105  CO2 25  GLUCOSE 150*  BUN 10  CREATININE 0.56  CALCIUM 8.2*   Liver Function Tests:  Recent Labs Lab 09/02/15 1443  AST 12*  ALT 12*  ALKPHOS 108  BILITOT 0.4  PROT 5.6*  ALBUMIN 2.6*   No results for input(s): LIPASE, AMYLASE in the last 168 hours. No results for input(s): AMMONIA in the last 168 hours. CBC:  Recent Labs Lab 09/02/15 1443  WBC 5.8  NEUTROABS 5.1  HGB 12.5  HCT 38.4  MCV 93.9  PLT 187   Cardiac Enzymes: No results for input(s): CKTOTAL, CKMB, CKMBINDEX, TROPONINI in the last 168 hours.  BNP (last 3 results)  Recent Labs  09/02/15 1446  BNP 202.0*    ProBNP (last 3 results) No results for input(s): PROBNP in the last 8760 hours.  CBG: No results for input(s): GLUCAP in the last 168 hours.  Radiological Exams on Admission: Dg Chest 2 View  09/02/2015  CLINICAL DATA:  Asthma, shortness of breath for 2 days, has used nebulizer and rescue inhaler today, having wheezing and crackles in all lung fields, productive cough for 1 week, nasal discharge, former smoker, has received albuterol, Atrovent and Solu-Medrol EXAM: CHEST  2 VIEW COMPARISON:  05/03/2009 FINDINGS: Normal heart size, mediastinal contours, and pulmonary vascularity. Minimal atherosclerotic  calcification aortic arch. Lungs emphysematous but clear. No pleural effusion or pneumothorax. Bones unremarkable. IMPRESSION: COPD changes. No acute abnormalities. Electronically Signed   By: Ulyses Southward M.D.   On: 09/02/2015 14:27    EKG: Independently reviewed. Normal sinus rhythm, no ST acute ST-T wave changes  Assessment/Plan  Asthma exacerbation -Admit to telemetry -IV Solu-Medrol, Levaquin, DuoNeb's -Mucinex BID     Essential hypertension -Stable, continue ARB, Toprol, hold HCTZ    Anxiety -Continue Xanax per home regimen    Hyperthyroidism -continue methimazole  DVT prophylaxis with Lovenox  Code Status: DNR Family Communication: Daughter at bedside  Disposition Plan: Inpatient home in 1-2 days of stable ,  Time spent:  [facility] Pager [phone]

## 2015-09-02 NOTE — ED Notes (Signed)
Pt unsteady on her feet, needs 1 staff assistance to ambulate.

## 2015-09-02 NOTE — ED Provider Notes (Signed)
CSN: [id number]     Arrival date & time 09/02/15  1339 History   First MD Initiated Contact with Patient 09/02/15 1418     Chief Complaint  Patient presents with  . Asthma  . Shortness of Breath     (Consider location/radiation/quality/duration/timing/severity/associated sxs/prior Treatment) HPI Comments: Patient is a 79 year old female with history of asthma. She presents for evaluation of shortness of breath. This has worsened over the past 2 days. She reports cough productive of sputum. She denies fevers or chills. She denies any chest pain, swelling in her legs, or calf pain.  Patient is a 79 y.o. female presenting with asthma. The history is provided by the patient.  Asthma This is a recurrent problem. The current episode started 2 days ago. The problem occurs constantly. The problem has been gradually worsening. Nothing aggravates the symptoms. Nothing relieves the symptoms. Treatments tried: Albuterol nebulizer. The treatment provided no relief.    Past Medical History  Diagnosis Date  . OSTEOPOROSIS 04/30/2007  . ASTHMA 05/03/2009  . HYPERTENSION 04/30/2007  . HYPERTHYROIDISM 06/17/2007  . Multinodular goiter   . Menopause   . Dyslipidemia   . CVA (cerebral infarction)    Past Surgical History  Procedure Laterality Date  . Other surgical history      discectomy  . Carotid artery surgery    . Neck surgery    . Back surgery     Family History  Problem Relation Age of Onset  . Heart disease Other    Social History  Substance Use Topics  . Smoking status: Former Games developermoker  . Smokeless tobacco: Never Used  . Alcohol Use: Yes     Comment: Socially   OB History    No data available     Review of Systems  All other systems reviewed and are negative.     Allergies  Codeine; Meperidine; Meperidine hcl; Sulfonamide derivatives; Nabumetone; Naproxen; and Sulfa antibiotics  Home Medications   Prior to Admission medications   Medication Sig Start Date End Date  Taking? Authorizing Provider  albuterol (PROVENTIL HFA;VENTOLIN HFA) 108 (90 BASE) MCG/ACT inhaler Inhale 2 puffs into the lungs every 6 (six) hours as needed. 04/20/14   Leslye Peerobert S Byrum, MD  ALPRAZolam Prudy Feeler(XANAX) 0.25 MG tablet Take 0.25 mg by mouth at bedtime as needed for anxiety.    Historical Provider, MD  aspirin 325 MG tablet Take 325 mg by mouth daily.      Historical Provider, MD  b complex vitamins tablet Take 1 tablet by mouth daily.      Historical Provider, MD  budesonide-formoterol (SYMBICORT) 80-4.5 MCG/ACT inhaler Inhale 2 puffs into the lungs 2 (two) times daily.    Historical Provider, MD  Calcium Carbonate-Vitamin D (CALCIUM 500 + D PO) Take 1 tablet by mouth 2 (two) times daily.     Historical Provider, MD  ezetimibe-simvastatin (VYTORIN) 10-20 MG per tablet Take 1 tablet by mouth at bedtime.      Historical Provider, MD  fluticasone (FLONASE) 50 MCG/ACT nasal spray Use as needed     Historical Provider, MD  hydrochlorothiazide (HYDRODIURIL) 12.5 MG tablet Take 12.5 mg by mouth daily.      Historical Provider, MD  levalbuterol Pauline Aus(XOPENEX) 0.63 MG/3ML nebulizer solution Take 1 ampule by nebulization every 4 (four) hours as needed.    Historical Provider, MD  loratadine (CLARITIN) 10 MG tablet Take 1 tablet (10 mg total) by mouth daily. 03/16/13   Leslye Peerobert S Byrum, MD  methimazole (TAPAZOLE) 10 MG tablet Take  1 tablet (10 mg total) by mouth daily. 07/10/15   Romero Belling, MD  metoprolol (TOPROL-XL) 50 MG 24 hr tablet Take 50 mg by mouth daily.      Historical Provider, MD  Mometasone Furo-Formoterol Fum (DULERA) 200-5 MCG/ACT AERO Inhale 2 puffs into the lungs 2 (two) times daily.    Historical Provider, MD  montelukast (SINGULAIR) 10 MG tablet Take 10 mg by mouth at bedtime.    Historical Provider, MD  Multiple Vitamin (MULTIVITAMIN) tablet Take 1 tablet by mouth daily.      Historical Provider, MD  potassium chloride (KLOR-CON) 8 MEQ CR tablet Take 8 mEq by mouth daily.      Historical  Provider, MD  telmisartan (MICARDIS) 40 MG tablet Take 1/2 tablet by mouth once daily     Historical Provider, MD   BP 109/97 mmHg  Pulse 83  Temp(Src) 99.3 F (37.4 C) (Oral)  Resp 14  SpO2 93% Physical Exam  Constitutional: She is oriented to person, place, and time. She appears well-developed and well-nourished. No distress.  HENT:  Head: Normocephalic and atraumatic.  Neck: Normal range of motion. Neck supple.  Cardiovascular: Normal rate and regular rhythm.  Exam reveals no gallop and no friction rub.   No murmur heard. Pulmonary/Chest: Effort normal. No respiratory distress. She has wheezes. She has no rales.  There are slight rhonchi bilaterally.  Abdominal: Soft. Bowel sounds are normal. She exhibits no distension. There is no tenderness.  Musculoskeletal: Normal range of motion. She exhibits no edema.  There is no calf tenderness or swelling, and Homans sign is absent bilaterally.  Neurological: She is alert and oriented to person, place, and time.  Skin: Skin is warm and dry. She is not diaphoretic.  Nursing note and vitals reviewed.   ED Course  Procedures (including critical care time) Labs Review Labs Reviewed  COMPREHENSIVE METABOLIC PANEL  CBC WITH DIFFERENTIAL/PLATELET  BRAIN NATRIURETIC PEPTIDE  I-STAT TROPOININ, ED    Imaging Review No results found. I have personally reviewed and evaluated these images and lab results as part of my medical decision-making.   EKG Interpretation   Date/Time:  Sunday September 02 2015 14:37:29 EST Ventricular Rate:  77 PR Interval:  127 QRS Duration: 94 QT Interval:  393 QTC Calculation: 445 R Axis:   -25 Text Interpretation:  Sinus rhythm Borderline left axis deviation Abnormal  R-wave progression, early transition Baseline wander in lead(s) V6  Confirmed by Jamyron Redd  MD, Doloris Servantes ([id number]) on 09/02/2015 2:40:33 PM      MDM   Final diagnoses:  None    Patient with history of asthma/COPD. She presents for  evaluation of shortness of breath and wheezing. She has been hypoxic while in the emergency department. She was given steroids by EMS along with multiple nebulizer treatments by EMS and in the ER. She continues to be hypoxic with ambulation with saturations dropping to 85%. I feel as though this patient will require admission and will speak with the hospitalist regarding this.    Geoffery Lyons, MD 09/02/15 [phone]

## 2015-09-02 NOTE — ED Notes (Signed)
Pt back from x-ray.

## 2015-09-02 NOTE — ED Notes (Signed)
Pt can go to floor at 17;10, [place]

## 2015-09-02 NOTE — ED Notes (Signed)
RT called

## 2015-09-02 NOTE — ED Notes (Addendum)
Per ems pt reports hx of asthma,  SOB x2 days, pt used neublizer today 12/18 at 0400 and 0800, used rescue inhaler around 1330. Wheezing heard in all lung fields. 5mg  albuteral, 0.5 mg atrovent, and 125 mg solumedrol given. 20 G L AC.  Upon rn assesment, crackles heard in all lung fields. Pt reports productive cough x1 week, white sputum. Nasal discharge present as well. Denies pain.   former cigarette smoking x20 years.

## 2015-09-03 LAB — CBC
HCT: 41.5 % (ref 36.0–46.0)
Hemoglobin: 13.4 g/dL (ref 12.0–15.0)
MCH: 30.6 pg (ref 26.0–34.0)
MCHC: 32.3 g/dL (ref 30.0–36.0)
MCV: 94.7 fL (ref 78.0–100.0)
Platelets: 166 10*3/uL (ref 150–400)
RBC: 4.38 MIL/uL (ref 3.87–5.11)
RDW: 15.9 % — ABNORMAL HIGH (ref 11.5–15.5)
WBC: 3.3 10*3/uL — ABNORMAL LOW (ref 4.0–10.5)

## 2015-09-03 LAB — BASIC METABOLIC PANEL
Anion gap: 9 (ref 5–15)
BUN: 14 mg/dL (ref 6–20)
CO2: 25 mmol/L (ref 22–32)
Calcium: 8.8 mg/dL — ABNORMAL LOW (ref 8.9–10.3)
Chloride: 105 mmol/L (ref 101–111)
Creatinine, Ser: 0.62 mg/dL (ref 0.44–1.00)
GFR calc Af Amer: >60 mL/min (ref >60)
GFR calc non Af Amer: >60 mL/min (ref >60)
Glucose, Bld: 155 mg/dL — ABNORMAL HIGH (ref 65–99)
Potassium: 5 mmol/L (ref 3.5–5.1)
Sodium: 139 mmol/L (ref 135–145)

## 2015-09-03 LAB — INFLUENZA PANEL BY PCR (TYPE A & B)
H1N1 flu by pcr: NOT DETECTED
Influenza A By PCR: NEGATIVE
Influenza B By PCR: NEGATIVE

## 2015-09-03 MED ORDER — LEVALBUTEROL HCL 0.63 MG/3ML IN NEBU
0.6300 mg | INHALATION_SOLUTION | RESPIRATORY_TRACT | Status: DC
  Administered 2015-09-03 – 2015-09-04 (×5): 0.63 mg via RESPIRATORY_TRACT
  Filled 2015-09-03 (×6): qty 3

## 2015-09-03 MED ORDER — IBUPROFEN 200 MG PO TABS: 600.0000 mg | ORAL_TABLET | Freq: Once | ORAL | Status: DC

## 2015-09-03 MED ORDER — IPRATROPIUM BROMIDE 0.02 % IN SOLN
RESPIRATORY_TRACT | Status: AC
  Filled 2015-09-03: qty 2.5

## 2015-09-03 MED ORDER — IBUPROFEN 200 MG PO TABS
600.0000 mg | ORAL_TABLET | Freq: Once | ORAL | Status: DC
  Administered 2015-09-03: 600 mg via ORAL
  Filled 2015-09-03: qty 3

## 2015-09-03 MED ORDER — METHYLPREDNISOLONE SODIUM SUCC 125 MG IJ SOLR
80.0000 mg | Freq: Two times a day (BID) | INTRAMUSCULAR | Status: DC
  Administered 2015-09-03 – 2015-09-04 (×2): 80 mg via INTRAVENOUS
  Filled 2015-09-03 (×2): qty 2

## 2015-09-03 MED ORDER — METHYLPREDNISOLONE SODIUM SUCC 40 MG IJ SOLR: 40.0000 mg | Freq: Two times a day (BID) | INTRAMUSCULAR | Status: DC

## 2015-09-03 MED ORDER — LEVALBUTEROL HCL 0.63 MG/3ML IN NEBU
0.6300 mg | INHALATION_SOLUTION | RESPIRATORY_TRACT | Status: DC
  Administered 2015-09-03: 0.63 mg via RESPIRATORY_TRACT

## 2015-09-03 MED ORDER — IPRATROPIUM BROMIDE 0.02 % IN SOLN
0.5000 mg | RESPIRATORY_TRACT | Status: DC
Start: 2015-09-03 — End: 2015-09-04
  Administered 2015-09-03 – 2015-09-04 (×7): 0.5 mg via RESPIRATORY_TRACT
  Filled 2015-09-03 (×6): qty 2.5

## 2015-09-03 MED ORDER — IBUPROFEN 200 MG PO TABS
600.0000 mg | ORAL_TABLET | Freq: Four times a day (QID) | ORAL | Status: DC | PRN
  Administered 2015-09-03: 600 mg via ORAL
  Filled 2015-09-03: qty 3

## 2015-09-03 MED ORDER — PREDNISONE 20 MG PO TABS
40.0000 mg | ORAL_TABLET | Freq: Every day | ORAL | Status: DC
  Filled 2015-09-03: qty 2

## 2015-09-03 MED ORDER — LEVALBUTEROL HCL 0.63 MG/3ML IN NEBU
INHALATION_SOLUTION | RESPIRATORY_TRACT | Status: AC
  Administered 2015-09-04: 0.63 mg
  Filled 2015-09-03: qty 3

## 2015-09-03 MED ORDER — IPRATROPIUM-ALBUTEROL 0.5-2.5 (3) MG/3ML IN SOLN
3.0000 mL | RESPIRATORY_TRACT | Status: DC
  Administered 2015-09-03: 3 mL via RESPIRATORY_TRACT
  Filled 2015-09-03 (×2): qty 3

## 2015-09-03 MED ORDER — IBUPROFEN 200 MG PO TABS
600.0000 mg | ORAL_TABLET | Freq: Four times a day (QID) | ORAL | Status: DC | PRN
  Administered 2015-09-04 – 2015-09-06 (×2): 600 mg via ORAL
  Filled 2015-09-03 (×2): qty 3

## 2015-09-03 NOTE — Evaluation (Signed)
Physical Therapy Evaluation Patient Details Name: BARBARA AHART MRN: [id number] DOB: [birth year] Today's Date: 09/03/2015   History of Present Illness  79 yo female admitted with asthma exac. Hx of asthma, HATN, anxiety, osteoporosis, CVA  Clinical Impression  On eval, pt required Min guard-Min assist for mobility-walked ~150 feet. Unsteady at times. O2 sats 90% on RA during ambulation, dyspnea 2/4. Daughter present-has been helping pt to bathroom. Discussed d/c plan-daughter states she can stay with pt initially. Recommend daily ambulation in hallway with nursing/family.     Follow Up Recommendations Supervision - Intermittent    Equipment Recommendations  None recommended by PT    Recommendations for Other Services       Precautions / Restrictions Precautions Precautions: Fall Restrictions Weight Bearing Restrictions: No      Mobility  Bed Mobility Overal bed mobility: Modified Independent                Transfers Overall transfer level: Needs assistance   Transfers: Sit to/from Stand Sit to Stand: Min guard         General transfer comment: close guard for safety. Pt is mildly unsteady.  Ambulation/Gait Ambulation/Gait assistance: Min assist;Min guard Ambulation Distance (Feet): 150 Feet   Gait Pattern/deviations: Step-through pattern;Decreased stride length     General Gait Details: O2 sats ~90% on RA during ambulation. Dsypnea 2/4. Pt is unsteady at times-intermittent small amount of assist to steady.  Stairs            Wheelchair Mobility    Modified Rankin (Stroke Patients Only)       Balance Overall balance assessment: Needs assistance         Standing balance support: During functional activity Standing balance-Leahy Scale: Fair                               Pertinent Vitals/Pain Pain Assessment: No/denies pain    Home Living Family/patient expects to be discharged to:: Private residence Living  Arrangements: Alone Available Help at Discharge: Family;Available PRN/intermittently Type of Home: House       Home Layout: One level Home Equipment: None      Prior Function Level of Independence: Independent               Hand Dominance        Extremity/Trunk Assessment   Upper Extremity Assessment: Overall WFL for tasks assessed           Lower Extremity Assessment: Generalized weakness      Cervical / Trunk Assessment: Normal  Communication   Communication: No difficulties  Cognition Arousal/Alertness: Awake/alert Behavior During Therapy: WFL for tasks assessed/performed Overall Cognitive Status: Within Functional Limits for tasks assessed                      General Comments      Exercises        Assessment/Plan    PT Assessment Patient needs continued PT services  PT Diagnosis Difficulty walking;Generalized weakness   PT Problem List Decreased mobility;Decreased balance;Decreased activity tolerance  PT Treatment Interventions Gait training;Functional mobility training;Therapeutic activities;Patient/family education;Balance training;Therapeutic exercise   PT Goals (Current goals can be found in the Care Plan section) Acute Rehab PT Goals Patient Stated Goal: to get better PT Goal Formulation: With patient/family Time For Goal Achievement: 09/17/15 Potential to Achieve Goals: Good    Frequency Min 3X/week   Barriers to discharge  Co-evaluation               End of Session Equipment Utilized During Treatment: Gait belt Activity Tolerance: Patient tolerated treatment well Patient left: in bed;with call bell/phone within reach;with family/visitor present           Time: [id number] PT Time Calculation (min) (ACUTE ONLY): 22 min   Charges:   PT Evaluation $Initial PT Evaluation Tier I: 1 Procedure     PT G Codes:        Rebeca AlertJannie Zenab [place], MPT Pager: [phone]

## 2015-09-03 NOTE — Progress Notes (Signed)
[facility] PROGRESS NOTE  Ariel Lambert UYQ:[id number] DOB: 1935/09/22 DOA: 09/02/2015 PCP: Emeterio Reeve, MD  Assessment/Plan: Asthma exacerbation -slow improvement, CXR yesterday benign -cut down IV Solu-Medrol, Levaquin, DuoNeb's -Mucinex BID -change to prednisone tomorrow   Essential hypertension -stable -Stable, continue ARB, Toprol, held HCTZ   Anxiety -Continue Xanax per home regimen   Hyperthyroidism -continue methimazole  DVT prophylaxis with Lovenox  Code Status: DNR Family Communication: Daughter at bedside  Disposition Plan: Inpatient home in 1-2 days of stable ,    HPI/Subjective: Breathing improving, headcahe, cough  Objective: Filed Vitals:   09/02/15 2123 09/03/15 0431  BP: 112/63 136/89  Pulse: 76 74  Temp: 98 F (36.7 C) 98.2 F (36.8 C)  Resp: 18 19    Intake/Output Summary (Last 24 hours) at 09/03/15 1347 Last data filed at 09/02/15 2300  Gross per 24 hour  Intake    240 ml  Output      2 ml  Net    238 ml   Filed Weights   09/02/15 1730  Weight: 56.926 kg (125 lb 8 oz)    Exam:   General:  AAOx3  Cardiovascular: S1S2/RRR  Respiratory: improving air movement, scattered ronchi, no wheezes today  Abdomen: soft, NT, Bs present  Musculoskeletal: no edema c/c   Data Reviewed: Basic Metabolic Panel:  Recent Labs Lab 09/02/15 1443 09/03/15 0433  NA 138 139  K 3.6 5.0  CL 105 105  CO2 25 25  GLUCOSE 150* 155*  BUN 10 14  CREATININE 0.56 0.62  CALCIUM 8.2* 8.8*   Liver Function Tests:  Recent Labs Lab 09/02/15 1443  AST 12*  ALT 12*  ALKPHOS 108  BILITOT 0.4  PROT 5.6*  ALBUMIN 2.6*   No results for input(s): LIPASE, AMYLASE in the last 168 hours. No results for input(s): AMMONIA in the last 168 hours. CBC:  Recent Labs Lab 09/02/15 1443 09/03/15 0540  WBC 5.8 3.3*  NEUTROABS 5.1  --   HGB 12.5 13.4  HCT 38.4 41.5  MCV 93.9 94.7  PLT 187 166   Cardiac Enzymes: No  results for input(s): CKTOTAL, CKMB, CKMBINDEX, TROPONINI in the last 168 hours. BNP (last 3 results)  Recent Labs  09/02/15 1446  BNP 202.0*    ProBNP (last 3 results) No results for input(s): PROBNP in the last 8760 hours.  CBG: No results for input(s): GLUCAP in the last 168 hours.  No results found for this or any previous visit (from the past 240 hour(s)).   Studies: Dg Chest 2 View  09/02/2015  CLINICAL DATA:  Asthma, shortness of breath for 2 days, has used nebulizer and rescue inhaler today, having wheezing and crackles in all lung fields, productive cough for 1 week, nasal discharge, former smoker, has received albuterol, Atrovent and Solu-Medrol EXAM: CHEST  2 VIEW COMPARISON:  05/03/2009 FINDINGS: Normal heart size, mediastinal contours, and pulmonary vascularity. Minimal atherosclerotic calcification aortic arch. Lungs emphysematous but clear. No pleural effusion or pneumothorax. Bones unremarkable. IMPRESSION: COPD changes. No acute abnormalities. Electronically Signed   By: Ulyses Southward M.D.   On: 09/02/2015 14:27    Scheduled Meds: . aspirin EC  81 mg Oral Daily  . budesonide-formoterol  2 puff Inhalation BID  . enoxaparin (LOVENOX) injection  40 mg Subcutaneous Q24H  . ezetimibe-simvastatin  1 tablet Oral QHS  . ferrous sulfate  325 mg Oral Daily  . guaiFENesin  600 mg Oral BID  . ibuprofen  600 mg Oral Once  . ipratropium-albuterol  3 mL Nebulization QID  . irbesartan  37.5 mg Oral Daily  . levofloxacin (LEVAQUIN) IV  500 mg Intravenous Q24H  . methimazole  10 mg Oral Daily  . methylPREDNISolone (SOLU-MEDROL) injection  40 mg Intravenous Q12H  . metoprolol succinate  50 mg Oral Daily  . montelukast  10 mg Oral QHS  . multivitamin with minerals  1 tablet Oral Daily  . [START ON 09/04/2015] predniSONE  40 mg Oral Q breakfast   Continuous Infusions:  Antibiotics Given (last 72 hours)    None      Active Problems:   Essential hypertension   Asthma  exacerbation    Time spent: 35min    [facility] CenterJOSEPH,Cheston Coury  [facility] Pager [phone]. If 7PM-7AM, please contact night-coverage at [email or web address], password [email or web address] 09/03/2015, 1:47 PM  LOS: 1 day

## 2015-09-04 LAB — BASIC METABOLIC PANEL
Anion gap: 8 (ref 5–15)
BUN: 16 mg/dL (ref 6–20)
CO2: 26 mmol/L (ref 22–32)
Calcium: 8.4 mg/dL — ABNORMAL LOW (ref 8.9–10.3)
Chloride: 108 mmol/L (ref 101–111)
Creatinine, Ser: 0.71 mg/dL (ref 0.44–1.00)
GFR calc Af Amer: >60 mL/min (ref >60)
GFR calc non Af Amer: >60 mL/min (ref >60)
Glucose, Bld: 156 mg/dL — ABNORMAL HIGH (ref 65–99)
Potassium: 4.4 mmol/L (ref 3.5–5.1)
Sodium: 142 mmol/L (ref 135–145)

## 2015-09-04 LAB — CBC
HCT: 37.2 % (ref 36.0–46.0)
Hemoglobin: 12.3 g/dL (ref 12.0–15.0)
MCH: 31.3 pg (ref 26.0–34.0)
MCHC: 33.1 g/dL (ref 30.0–36.0)
MCV: 94.7 fL (ref 78.0–100.0)
Platelets: 175 10*3/uL (ref 150–400)
RBC: 3.93 MIL/uL (ref 3.87–5.11)
RDW: 16.2 % — ABNORMAL HIGH (ref 11.5–15.5)
WBC: 5.1 10*3/uL (ref 4.0–10.5)

## 2015-09-04 MED ORDER — METHYLPREDNISOLONE SODIUM SUCC 125 MG IJ SOLR
60.0000 mg | Freq: Two times a day (BID) | INTRAMUSCULAR | Status: AC
  Administered 2015-09-04: 60 mg via INTRAVENOUS
  Filled 2015-09-04: qty 2

## 2015-09-04 MED ORDER — LEVOFLOXACIN 500 MG PO TABS
500.0000 mg | ORAL_TABLET | ORAL | Status: DC
  Administered 2015-09-04 – 2015-09-06 (×3): 500 mg via ORAL
  Filled 2015-09-04 (×3): qty 1

## 2015-09-04 MED ORDER — LEVALBUTEROL HCL 0.63 MG/3ML IN NEBU
0.6300 mg | INHALATION_SOLUTION | Freq: Four times a day (QID) | RESPIRATORY_TRACT | Status: DC
  Administered 2015-09-05 (×4): 0.63 mg via RESPIRATORY_TRACT
  Filled 2015-09-04 (×4): qty 3

## 2015-09-04 MED ORDER — PREDNISONE 50 MG PO TABS
50.0000 mg | ORAL_TABLET | Freq: Every day | ORAL | Status: DC
  Administered 2015-09-05 – 2015-09-07 (×3): 50 mg via ORAL
  Filled 2015-09-04 (×3): qty 1

## 2015-09-04 MED ORDER — IPRATROPIUM BROMIDE 0.02 % IN SOLN
0.5000 mg | Freq: Four times a day (QID) | RESPIRATORY_TRACT | Status: DC
  Administered 2015-09-05 (×4): 0.5 mg via RESPIRATORY_TRACT
  Filled 2015-09-04 (×4): qty 2.5

## 2015-09-04 NOTE — Progress Notes (Signed)
[facility] PROGRESS NOTE  Ariel PREZIOSO XBJ:[id number] DOB: 1936-08-15 DOA: 09/02/2015 PCP: Emeterio Reeve, MD    HPI: Ariel Lambert is a 79 y.o. female with past medical history of asthma, hypertension, anxiety was in her usual state of health until yesterday. She developed cold-like symptoms with cough congestion runny nose yesterday, subsequently progressed to wheezing and shortness of breath today  Assessment/Plan: Acute hypoxic resp failure -due to asthma exacerbation -finally starting to turn the corner, CXR  benign -cut down IV Solu-Medrol, Prednisone from tomorrow -change Levaquin to PO, DuoNeb's -Mucinex BID -ambulate, OOB, Wean O2    Asthma -as above   Essential hypertension -stable -Stable, continue ARB, Toprol, held HCTZ   Anxiety -Continue Xanax per home regimen   Hyperthyroidism -continue methimazole  DVT prophylaxis -Lovenox  Code Status: DNR Family Communication: Daughter at bedside  Disposition Plan: home tomorrow ,    HPI/Subjective: Breathing improving, headcahe, cough-also much better  Objective: Filed Vitals:   09/03/15 2339 09/04/15 0625  BP: 111/72 154/91  Pulse: 52 70  Temp: 97.7 F (36.5 C) 97.5 F (36.4 C)  Resp: 20 20    Intake/Output Summary (Last 24 hours) at 09/04/15 0933 Last data filed at 09/04/15 0644  Gross per 24 hour  Intake    460 ml  Output      7 ml  Net    453 ml   Filed Weights   09/02/15 1730  Weight: 56.926 kg (125 lb 8 oz)    Exam:   General:  AAOx3  Cardiovascular: S1S2/RRR  Respiratory: improving air movement, scattered ronchi, no wheezes today  Abdomen: soft, NT, Bs present  Musculoskeletal: no edema c/c   Data Reviewed: Basic Metabolic Panel:  Recent Labs Lab 09/02/15 1443 09/03/15 0433 09/04/15 0430  NA 138 139 142  K 3.6 5.0 4.4  CL 105 105 108  CO2 GLUCOSE 150* 155* 156*  BUN CREATININE 0.56 0.62 0.71  CALCIUM 8.2* 8.8*  8.4*   Liver Function Tests:  Recent Labs Lab 09/02/15 1443  AST 12*  ALT 12*  ALKPHOS 108  BILITOT 0.4  PROT 5.6*  ALBUMIN 2.6*   No results for input(s): LIPASE, AMYLASE in the last 168 hours. No results for input(s): AMMONIA in the last 168 hours. CBC:  Recent Labs Lab 09/02/15 1443 09/03/15 0540 09/04/15 0430  WBC 5.8 3.3* 5.1  NEUTROABS 5.1  --   --   HGB 12.5 13.4 12.3  HCT 38.4 41.5 37.2  MCV 93.9 94.7 94.7  PLT 187 166 175   Cardiac Enzymes: No results for input(s): CKTOTAL, CKMB, CKMBINDEX, TROPONINI in the last 168 hours. BNP (last 3 results)  Recent Labs  09/02/15 1446  BNP 202.0*    ProBNP (last 3 results) No results for input(s): PROBNP in the last 8760 hours.  CBG: No results for input(s): GLUCAP in the last 168 hours.  No results found for this or any previous visit (from the past 240 hour(s)).   Studies: Dg Chest 2 View  09/02/2015  CLINICAL DATA:  Asthma, shortness of breath for 2 days, has used nebulizer and rescue inhaler today, having wheezing and crackles in all lung fields, productive cough for 1 week, nasal discharge, former smoker, has received albuterol, Atrovent and Solu-Medrol EXAM: CHEST  2 VIEW COMPARISON:  05/03/2009 FINDINGS: Normal heart size, mediastinal contours, and pulmonary vascularity. Minimal atherosclerotic calcification aortic arch. Lungs emphysematous but clear. No pleural effusion or pneumothorax. Bones unremarkable. IMPRESSION:  sFrancine GravenRuben Gottron >2Glenn 56

## 2015-09-04 NOTE — Clinical Documentation Improvement (Signed)
Internal Medicine  Can the diagnosis of Hypoxia be further specified? Please document findings in next progress note; not in BPA drop down box.   Other  Clinically Undetermined  Supporting Information:  Hypoxia documented with sat's to mid 80's in room air - ED notes  RR ranging from 13 to 22  HR > 90 at times via flow sheet  Placed in 2L progressing to 3L of FiO2; usually in room air at home  Please exercise your independent, professional judgment when responding. A specific answer is not anticipated or expected.  Thank You, Shellee MiloEileen T Myriam Brandhorst RN, BSN, CCDS Health Information Management [place] [phone]; Cell: [phone]

## 2015-09-04 NOTE — Progress Notes (Signed)
O2Sats on 2L [place] - 97% O2Sats Off O2 - 93% at rest

## 2015-09-05 MED ORDER — HYDRALAZINE HCL 20 MG/ML IJ SOLN
10.0000 mg | Freq: Once | INTRAMUSCULAR | Status: AC
  Administered 2015-09-05: 10 mg via INTRAVENOUS
  Filled 2015-09-05: qty 1

## 2015-09-05 MED ORDER — IPRATROPIUM BROMIDE 0.02 % IN SOLN
0.5000 mg | RESPIRATORY_TRACT | Status: DC
  Administered 2015-09-06 – 2015-09-07 (×8): 0.5 mg via RESPIRATORY_TRACT
  Filled 2015-09-05 (×9): qty 2.5

## 2015-09-05 MED ORDER — IPRATROPIUM-ALBUTEROL 0.5-2.5 (3) MG/3ML IN SOLN: 3.0000 mL | RESPIRATORY_TRACT | Status: DC

## 2015-09-05 MED ORDER — FLUTICASONE PROPIONATE 50 MCG/ACT NA SUSP
2.0000 | Freq: Every day | NASAL | Status: DC
  Administered 2015-09-05 – 2015-09-07 (×3): 2 via NASAL
  Filled 2015-09-05: qty 16

## 2015-09-05 MED ORDER — HYDROCHLOROTHIAZIDE 25 MG PO TABS
12.5000 mg | ORAL_TABLET | Freq: Every day | ORAL | Status: DC
  Administered 2015-09-05 – 2015-09-07 (×3): 12.5 mg via ORAL
  Filled 2015-09-05 (×3): qty 1

## 2015-09-05 MED ORDER — LEVALBUTEROL HCL 0.63 MG/3ML IN NEBU
0.6300 mg | INHALATION_SOLUTION | RESPIRATORY_TRACT | Status: DC
  Administered 2015-09-06 – 2015-09-07 (×8): 0.63 mg via RESPIRATORY_TRACT
  Filled 2015-09-05 (×9): qty 3

## 2015-09-05 MED ORDER — ZOLPIDEM TARTRATE 5 MG PO TABS
5.0000 mg | ORAL_TABLET | Freq: Once | ORAL | Status: AC
  Administered 2015-09-05: 5 mg via ORAL
  Filled 2015-09-05: qty 1

## 2015-09-05 NOTE — Care Management Important Message (Signed)
Important Message  Patient Details  Name: Ariel Lambert MRN: [id number] Date of Birth: [birth year]   Medicare Important Message Given:  Yes    Haskell FlirtJamison, Zaniyah Wernette 09/05/2015, 11:19 AMImportant Message  Patient Details  Name: Ariel Lambert MRN: [id number] Date of Birth: [birth year]   Medicare Important Message Given:  Yes    Haskell FlirtJamison, Caylin Nass 09/05/2015, 11:19 AM

## 2015-09-05 NOTE — Progress Notes (Signed)
[facility] PROGRESS NOTE  Ariel Lambert [id number] DOB: [birth year] DOA: 09/02/2015 PCP: Emeterio ReeveWOLTERS,SHARON A, MD    HPI:  [id number] ? Asthma dx in 2007cerebrovascular disease with a prior left frontal stroke- prior bilateral carotid endarterectomies HTN, hypothyroidism, CVA.  after a hospitalization.  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  small multinodular goiter (dx'ed approx 1999; she chose chronic tapazole rx; most recent US was in 2015--no change).    She developed cold-like symptoms with cough congestion runny nose2-3 days PTA subsequently progressed to wheezing and shortness of breath today  Assessment/Plan: Acute hypoxic resp failure -due to asthma exacerbation -worsening symptoms overnight 12/20-12/21 -DuoNeb's freq increased to q4 scheduled -changed from IV Solu-Medrol-->Prednisone 12.21 -change Levaquin to PO,  -Mucinex BID -ambulate, OOB, Wean O2    Asthma -as above   Essential hypertension -stable -Stable, continue ARB, Toprol, held HCTZ   Anxiety -Continue Xanax per home regimen prnqhs -as was very sleepy this am 12/21 may need to wean this back   Hyperthyroidism -continue methimazole 10 daily  DVT prophylaxis -Lovenox  Code Status: DNR Family Communication: Daughter at bedside  Disposition Plan: home tomorrow ,    HPI/Subjective: Dong fair only Wheezy and coughing up gree sputum Concerned about use of HCTZ vs not  Objective: Filed Vitals:   09/05/15 0645 09/05/15 0818  BP: 156/100 120/65  Pulse:  82  Temp:    Resp:      Intake/Output Summary (Last 24 hours) at 09/05/15 1022 Last data filed at 09/05/15 0600  Gross per 24 hour  Intake    720 ml  Output      4 ml  Net    716 ml   Filed Weights   09/02/15 1730  Weight: 56.926 kg (125 lb 8 oz)    Exam:   General:  AAOx3  Cardiovascular: S1S2/RRR  Respiratory: imwheezy and congested sounded  Abdomen: soft, NT, Bs  present  Musculoskeletal: no edema c/c   Data Reviewed: Basic Metabolic Panel:  Recent Labs Lab 09/02/15 1443 09/03/15 0433 09/04/15 0430  NA 138 139 142  K 3.6 5.0 4.4  CL 105 105 108  CO2 25 25 26   GLUCOSE 150* 155* 156*  BUN 10 14 16   CREATININE 0.56 0.62 0.71  CALCIUM 8.2* 8.8* 8.4*   Liver Function Tests:  Recent Labs Lab 09/02/15 1443  AST 12*  ALT 12*  ALKPHOS 108  BILITOT 0.4  PROT 5.6*  ALBUMIN 2.6*   No results for input(s): LIPASE, AMYLASE in the last 168 hours. No results for input(s): AMMONIA in the last 168 hours. CBC:  Recent Labs Lab 09/02/15 1443 09/03/15 0540 09/04/15 0430  WBC 5.8 3.3* 5.1  NEUTROABS 5.1  --   --   HGB 12.5 13.4 12.3  HCT 38.4 41.5 37.2  MCV 93.9 94.7 94.7  PLT 187 166 175   Cardiac Enzymes: No results for input(s): CKTOTAL, CKMB, CKMBINDEX, TROPONINI in the last 168 hours. BNP (last 3 results)  Recent Labs  09/02/15 1446  BNP 202.0*    ProBNP (last 3 results) No results for input(s): PROBNP in the last 8760 hours.  CBG: No results for input(s): GLUCAP in the last 168 hours.  No results found for this or any previous visit (from the past 240 hour(s)).   Studies: No results found.  Scheduled Meds: . aspirin EC  81 mg Oral Daily  . budesonide-formoterol  2 puff Inhalation BID  . enoxaparin (LOVENOX) injection  40  mg Subcutaneous Q24H  . ezetimibe-simvastatin  1 tablet Oral QHS  . ferrous sulfate  325 mg Oral Daily  . fluticasone  2 spray Each Nare Daily  . guaiFENesin  600 mg Oral BID  . hydrochlorothiazide  12.5 mg Oral Daily  . ipratropium  0.5 mg Nebulization QID  . ipratropium-albuterol  3 mL Nebulization Q4H  . irbesartan  37.5 mg Oral Daily  . levalbuterol  0.63 mg Nebulization QID  . levofloxacin  500 mg Oral Q24H  . methimazole  10 mg Oral Daily  . metoprolol succinate  50 mg Oral Daily  . montelukast  10 mg Oral QHS  . multivitamin with minerals  1 tablet Oral Daily  . predniSONE  50 mg  Oral Q breakfast   Continuous Infusions:  Antibiotics Given (last 72 hours)    Date/Time Action Medication Dose Rate   09/03/15 1715 Given   levofloxacin (LEVAQUIN) IVPB 500 mg 500 mg 100 mL/hr   09/04/15 1339 Given   levofloxacin (LEVAQUIN) tablet 500 mg 500 mg       Active Problems:   Essential hypertension   Asthma exacerbation    Time spent:   Pleas Koch, MD [facility] [phone]

## 2015-09-05 NOTE — Progress Notes (Signed)
Pt and Pt's family requested that her breathing treatments be rescheduled to q4h.  Atrovent/Xopenex HHN rescheduled to q4h.

## 2015-09-05 NOTE — Care Management Note (Signed)
Case Management Note  Patient Details  Name: Elon Spannerlizabeth A Wahlberg MRN: [id number] Date of Birth: [birth year]  Subjective/Objective:                    Action/Plan: Pt plan to dc home with grandson, who lives with her at present time. Pt also stated she did not need HH.   Expected Discharge Date:                  Expected Discharge Plan:  Home w Home Health Services  In-House Referral:     Discharge planning Services  CM Consult  Post Acute Care Choice:    Choice offered to:     DME Arranged:    DME Agency:     HH Arranged:    HH Agency:     Status of Service:  In process, will continue to follow  Medicare Important Message Given:  Yes Date Medicare IM Given:    Medicare IM give by:    Date Additional Medicare IM Given:    Additional Medicare Important Message give by:     If discussed at Long Length of Stay Meetings, dates discussed:    Additional CommentsGeni Bers:  Taheem Fricke, RN 09/05/2015, 3:29 PM

## 2015-09-05 NOTE — Progress Notes (Signed)
Physical Therapy Treatment Patient Details Name: Ariel Lambert MRN: [id number] DOB: 1936/05/05 Today's Date: 09/05/2015    History of Present Illness 79 yo female admitted with asthma exac. Hx of asthma, HATN, anxiety, osteoporosis, CVA    PT Comments    Pt feeling much better than this morning.  Assisted OOB to bathroom then tolerated amb full unit on RA and no AD.  Pt lives with Lucila MaineGrandson who is very attentive.  No HH PT needed.  No equipment needed.   Follow Up Recommendations  Supervision - Intermittent;No PT follow up     Equipment Recommendations  None recommended by PT    Recommendations for Other Services       Precautions / Restrictions Precautions Precautions: Fall Restrictions Weight Bearing Restrictions: No    Mobility  Bed Mobility Overal bed mobility: Modified Independent                Transfers Overall transfer level: Needs assistance Equipment used: None Transfers: Sit to/from Stand Sit to Stand: Modified independent (Device/Increase time);Supervision         General transfer comment: slightly unsteady but pt safely uses furntiture/ect to steady self when needed.   Ambulation/Gait Ambulation/Gait assistance: Supervision Ambulation Distance (Feet): 45 Feet Assistive device: None Gait Pattern/deviations: Step-through pattern Gait velocity: WFL   General Gait Details: tolerated increased distance and required no AD.  RA avg 94% with HR 96.  only 1/4 DOE.  Much improved.  No coughing   Stairs            Wheelchair Mobility    Modified Rankin (Stroke Patients Only)       Balance                                    Cognition Arousal/Alertness: Awake/alert Behavior During Therapy: WFL for tasks assessed/performed Overall Cognitive Status: Within Functional Limits for tasks assessed                      Exercises      General Comments        Pertinent Vitals/Pain Pain Assessment:  No/denies pain    Home Living                      Prior Function            PT Goals (current goals can now be found in the care plan section) Progress towards PT goals: Progressing toward goals    Frequency  Min 3X/week    PT Plan Current plan remains appropriate    Co-evaluation             End of Session Equipment Utilized During Treatment: Gait belt Activity Tolerance: Patient tolerated treatment well Patient left: in chair;with call bell/phone within reach;with family/visitor present     Time: 1455-1510 PT Time Calculation (min) (ACUTE ONLY): 15 min  Charges:  $Gait Training: 8-22 mins                    G Codes:      Felecia ShellingLori Clint Strupp  PTA [facility]  Acute  Rehab Pager      [phone]

## 2015-09-06 ENCOUNTER — Inpatient Hospital Stay (HOSPITAL_COMMUNITY): Payer: Medicare Other

## 2015-09-06 LAB — BRAIN NATRIURETIC PEPTIDE: B Natriuretic Peptide: 175.2 pg/mL — ABNORMAL HIGH (ref 0.0–100.0)

## 2015-09-06 MED ORDER — FUROSEMIDE 20 MG PO TABS
20.0000 mg | ORAL_TABLET | Freq: Once | ORAL | Status: AC
  Administered 2015-09-06: 20 mg via ORAL
  Filled 2015-09-06: qty 1

## 2015-09-06 MED ORDER — VITAMINS A & D EX OINT
TOPICAL_OINTMENT | CUTANEOUS | Status: AC
  Administered 2015-09-06: 5
  Filled 2015-09-06: qty 5

## 2015-09-06 NOTE — Progress Notes (Signed)
[facility] PROGRESS NOTE  Ariel Lambert [id number] DOB: 1935/10/17 DOA: 09/02/2015 PCP: Emeterio Reeve, MD    HPI:   25 ? Asthma dx in 2007cerebrovascular disease with a prior left frontal stroke- prior bilateral carotid endarterectomies HTN, hypothyroidism, CVA.  after a hospitalization.  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  small multinodular goiter (dx'ed approx 1999; she chose chronic tapazole rx; most recent US was in 2015--no change).    She developed cold-like symptoms with cough congestion runny nose2-3 days PTA subsequently progressed to wheezing and shortness of breath today  Assessment/Plan: Acute hypoxic resp failure -due to asthma exacerbation -worsening symptoms overnight 12/20-12/21 -obtain 2 vw CXR 09/06/15 as noted some JVD on L side on 12/22, Get BNP as well -DuoNeb's freq increased to q4 scheduled -changed from IV Solu-Medrol-->Prednisone 12.21 -change Levaquin to PO,  -Mucinex BID -ambulate, OOB, Wean O2    Asthma -as above   Essential hypertension -stable -Stable, continue ARB, Toprol, held HCTZ   Anxiety -Continue Xanax per home regimen prnqhs -doing fair on this   Hyperthyroidism -continue methimazole 10 daily  DVT prophylaxis -Lovenox  Code Status: DNR Family Communication: Daughter at bedside  Disposition Plan: home tomorrow ,    HPI/Subjective:  Fair overnight no cp no n/v Felt Sob and had sputum at night Couldn't sleep well No fever nor chills  Objective: Filed Vitals:   09/06/15 0322 09/06/15 0415  BP: 154/81 145/82  Pulse:  72  Temp:  98.3 F (36.8 C)  Resp:  19    Intake/Output Summary (Last 24 hours) at 09/06/15 1059 Last data filed at 09/06/15 0416  Gross per 24 hour  Intake    960 ml  Output      4 ml  Net    956 ml   Filed Weights   09/02/15 1730  Weight: 56.926 kg (125 lb 8 oz)    Exam:   General:  AAOx3  Cardiovascular: S1S2/RRR  Respiratory:  wheeze still in ant lung fileds and congested sounded  Abdomen: soft, NT, Bs present  Musculoskeletal: no edema c/c   Data Reviewed: Basic Metabolic Panel:  Recent Labs Lab 09/02/15 1443 09/03/15 0433 09/04/15 0430  NA 138 139 142  K 3.6 5.0 4.4  CL 105 105 108  CO2 GLUCOSE 150* 155* 156*  BUN CREATININE 0.56 0.62 0.71  CALCIUM 8.2* 8.8* 8.4*   Liver Function Tests:  Recent Labs Lab 09/02/15 1443  AST 12*  ALT 12*  ALKPHOS 108  BILITOT 0.4  PROT 5.6*  ALBUMIN 2.6*   No results for input(s): LIPASE, AMYLASE in the last 168 hours. No results for input(s): AMMONIA in the last 168 hours. CBC:  Recent Labs Lab 09/02/15 1443 09/03/15 0540 09/04/15 0430  WBC 5.8 3.3* 5.1  NEUTROABS 5.1  --   --   HGB 12.5 13.4 12.3  HCT 38.4 41.5 37.2  MCV 93.9 94.7 94.7  PLT 187 166 175   Cardiac Enzymes: No results for input(s): CKTOTAL, CKMB, CKMBINDEX, TROPONINI in the last 168 hours. BNP (last 3 results)  Recent Labs  09/02/15 1446  BNP 202.0*    ProBNP (last 3 results) No results for input(s): PROBNP in the last 8760 hours.  CBG: No results for input(s): GLUCAP in the last 168 hours.  No results found for this or any previous visit (from the past 240 hour(s)).   Studies: No results found.  Scheduled Meds: .  aspirin EC  81 mg Oral Daily  . budesonide-formoterol  2 puff Inhalation BID  . enoxaparin (LOVENOX) injection  40 mg Subcutaneous Q24H  . ezetimibe-simvastatin  1 tablet Oral QHS  . ferrous sulfate  325 mg Oral Daily  . fluticasone  2 spray Each Nare Daily  . guaiFENesin  600 mg Oral BID  . hydrochlorothiazide  12.5 mg Oral Daily  . ipratropium  0.5 mg Nebulization Q4H  . irbesartan  37.5 mg Oral Daily  . levalbuterol  0.63 mg Nebulization 6 times per day  . levofloxacin  500 mg Oral Q24H  . methimazole  10 mg Oral Daily  . metoprolol succinate  50 mg Oral Daily  . montelukast  10 mg Oral QHS  . multivitamin with minerals   1 tablet Oral Daily  . predniSONE  50 mg Oral Q breakfast  . vitamin A & D       Continuous Infusions:  Antibiotics Given (last 72 hours)    Date/Time Action Medication Dose Rate   09/03/15 1715 Given   levofloxacin (LEVAQUIN) IVPB 500 mg 500 mg 100 mL/hr   09/04/15 1339 Given   levofloxacin (LEVAQUIN) tablet 500 mg 500 mg    09/05/15 1143 Given   levofloxacin (LEVAQUIN) tablet 500 mg 500 mg       Active Problems:   Essential hypertension   Asthma exacerbation    Time spent: 25min   Pleas KochJai Zaley Talley, MD [facility] (P) [phone]

## 2015-09-06 NOTE — Progress Notes (Signed)
Physical Therapy Treatment Patient Details Name: Ariel Lambert MRN: [id number] DOB: 1935/11/08 Today's Date: 09/06/2015    History of Present Illness 79 yo female admitted with asthma exac. Hx of asthma, HATN, anxiety, osteoporosis, CVA    PT Comments    Assisted pt OOB to bathroom then amb a greater distance in hallway.  Progressing well.  Did note increased nasal congestion and coughing today.  RN already aware.    Follow Up Recommendations  Supervision - Intermittent;No PT follow up     Equipment Recommendations       Recommendations for Other Services       Precautions / Restrictions Precautions Precautions: Fall Restrictions Weight Bearing Restrictions: No    Mobility  Bed Mobility Overal bed mobility: Modified Independent                Transfers Overall transfer level: Needs assistance Equipment used: None                Ambulation/Gait Ambulation/Gait assistance: Supervision Ambulation Distance (Feet): 85 Feet Assistive device: None   Gait velocity: WFL   General Gait Details: tolerated increased distance and required no AD.  Noted increased nasal congestion and increased coughing.  RN already aware.    Stairs            Wheelchair Mobility    Modified Rankin (Stroke Patients Only)       Balance                                    Cognition Arousal/Alertness: Awake/alert Behavior During Therapy: WFL for tasks assessed/performed Overall Cognitive Status: Within Functional Limits for tasks assessed                      Exercises      General Comments        Pertinent Vitals/Pain Pain Assessment: No/denies pain    Home Living                      Prior Function            PT Goals (current goals can now be found in the care plan section) Progress towards PT goals: Progressing toward goals    Frequency  Min 3X/week    PT Plan Current plan remains appropriate     Co-evaluation             End of Session Equipment Utilized During Treatment: Gait belt Activity Tolerance: Patient tolerated treatment well Patient left: in chair;with call bell/phone within reach     Time: 1015-1030 PT Time Calculation (min) (ACUTE ONLY): 15 min  Charges:  $Gait Training: 8-22 mins                    G Codes:      Felecia ShellingLori Vilda Zollner  PTA [facility]  Acute  Rehab Pager      [phone]

## 2015-09-07 ENCOUNTER — Encounter: Payer: Self-pay | Admitting: Family Medicine

## 2015-09-07 LAB — BASIC METABOLIC PANEL
Anion gap: 9 (ref 5–15)
BUN: 18 mg/dL (ref 6–20)
CO2: 26 mmol/L (ref 22–32)
Calcium: 8.5 mg/dL — ABNORMAL LOW (ref 8.9–10.3)
Chloride: 108 mmol/L (ref 101–111)
Creatinine, Ser: 0.67 mg/dL (ref 0.44–1.00)
GFR calc Af Amer: >60 mL/min (ref >60)
GFR calc non Af Amer: >60 mL/min (ref >60)
Glucose, Bld: 93 mg/dL (ref 65–99)
Potassium: 3.4 mmol/L — ABNORMAL LOW (ref 3.5–5.1)
Sodium: 143 mmol/L (ref 135–145)

## 2015-09-07 LAB — CBC
HCT: 38.3 % (ref 36.0–46.0)
Hemoglobin: 12.5 g/dL (ref 12.0–15.0)
MCH: 30 pg (ref 26.0–34.0)
MCHC: 32.6 g/dL (ref 30.0–36.0)
MCV: 92.1 fL (ref 78.0–100.0)
Platelets: 201 10*3/uL (ref 150–400)
RBC: 4.16 MIL/uL (ref 3.87–5.11)
RDW: 16 % — ABNORMAL HIGH (ref 11.5–15.5)
WBC: 6.9 10*3/uL (ref 4.0–10.5)

## 2015-09-07 MED ORDER — FUROSEMIDE 20 MG PO TABS: 20.0000 mg | ORAL_TABLET | Freq: Every day | ORAL | Status: DC

## 2015-09-07 MED ORDER — LEVALBUTEROL HCL 0.63 MG/3ML IN NEBU: 0.6300 mg | INHALATION_SOLUTION | RESPIRATORY_TRACT | Status: DC

## 2015-09-07 MED ORDER — PREDNISONE 50 MG PO TABS: 50.0000 mg | ORAL_TABLET | Freq: Every day | ORAL | Status: DC

## 2015-09-07 NOTE — Discharge Summary (Signed)
Physician Discharge Summary  DANNE SCARDINA ZOX:[id number] DOB: [birth year] DOA: 09/02/2015  PCP: Emeterio Reeve, MD  Admit date: 09/02/2015 Discharge date: 09/07/2015  Time spent: 35 minutes  Recommendations for Outpatient Follow-up:  1. Complete steroid burst 50 mg on 09/12/15 2. Refilled Xopenex 3. Consider OP work-up for CHF if persisting symptoms-she was given a 2 days supply of HCTZ on d/c home to go with her other medications 4. Consider OP CXR 1 mo 5. Needs Bmet in 1 week    Discharge Diagnoses:  Active Problems:   Essential hypertension   Asthma exacerbation   Discharge Condition: good   Diet recommendation:  HH low salt  Filed Weights   09/02/15 1730  Weight: 56.926 kg (125 lb 8 oz)    History of present illness:  50 ? Asthma dx in 2007cerebrovascular disease with a prior left frontal stroke- prior bilateral carotid endarterectomies HTN, hypothyroidism, CVA. after a hospitalization. 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  small multinodular goiter (dx'ed approx 1999; she chose chronic tapazole rx; most recent US was in 2015--no change).   She developed cold-like symptoms with cough congestion runny nose2-3 days PTA subsequently progressed to wheezing and shortness of breath today  Hospital Course:   Acute hypoxic resp failure -due to asthma exacerbation -worsening symptoms overnight 12/20-12/21 -obtain 2 vw CXR 09/06/15 as noted some JVD on L side on 12/22, Get BNP as well -DuoNeb's freq increased to q4 scheduled -changed from IV Solu-Medrol-->Prednisone 12.21 -change Levaquin to PO,  -Mucinex BID -ambulate, OOB, Wean O2  ? Undiagnosed component CHF -no echo performed, Clinically had JVD elevation prior to d/c -Given back HCTZ on 12/22 which had been held previouslty and symptoms imporved.  -given lasix 20 daily for 2 days on d/c ending 12/25 -suggest OP follow up and consideration for Echo and work-up on d/c per  PCP   Asthma -as above   Essential hypertension -stable -Stable, continue ARB, Toprol, held HCTZ initally   Anxiety -Continue Xanax per home regimen prnqhs -doing fair on this   Hyperthyroidism -continue methimazole 10 daily  Discharge Exam: Filed Vitals:   09/06/15 1524 09/07/15 0452  BP:  165/98  Pulse: 74 81  Temp:  98.8 F (37.1 C)  Resp: 20 19    General: alert slept well, no issues, no further breathign issues Cardiovascular: s1 s2 no m/r/g Respiratory: clear no added sound  Discharge Instructions   Discharge Instructions    Diet - low sodium heart healthy    Complete by:  As directed      Discharge instructions    Complete by:  As directed   Take prednisone 50 mg daily until 12/28 I recommned you take lasix 20 mg for 2 days edning on 12/25-I do no tthink you have findings of heart failure but you may have fallen behind on your HCTZ when you were first admitted and may benefit from a little "boost" to make you kidney pump out that extra fluid. Please get lab work in about 1 week at your regular Doctor's office to make sure your kidneys are working well I will give you a note to reimburse you for your flight     Increase activity slowly    Complete by:  As directed           Current Discharge Medication List    START taking these medications   Details  furosemide (LASIX) 20 MG tablet Take 1 tablet (20 mg total) by  mouth daily. Qty: 2 tablet, Refills: 0    predniSONE (DELTASONE) 50 MG tablet Take 1 tablet (50 mg total) by mouth daily with breakfast. Qty: 5 tablet, Refills: 0      CONTINUE these medications which have CHANGED   Details  levalbuterol (XOPENEX) 0.63 MG/3ML nebulizer solution Take 3 mLs (0.63 mg total) by nebulization every 4 (four) hours. Qty: 3 mL, Refills: 12      CONTINUE these medications which have NOT CHANGED   Details  acetaminophen (TYLENOL) 325 MG tablet Take 650 mg by mouth every 6 (six) hours as needed for headache.     albuterol (PROVENTIL HFA;VENTOLIN HFA) 108 (90 BASE) MCG/ACT inhaler Inhale 2 puffs into the lungs every 6 (six) hours as needed. Qty: 1 Inhaler, Refills: 6    ALPRAZolam (XANAX) 0.25 MG tablet Take 0.25 mg by mouth at bedtime as needed for anxiety.    aspirin EC 81 MG tablet Take 81 mg by mouth daily.    aspirin-acetaminophen-caffeine (EXCEDRIN MIGRAINE) 250-250-65 MG tablet Take 2 tablets by mouth every 6 (six) hours as needed for headache or migraine.    b complex vitamins tablet Take 1 tablet by mouth daily.      Calcium Carbonate-Vitamin D (CALCIUM 500 + D PO) Take 1 tablet by mouth 2 (two) times daily.     diphenhydrAMINE (BENADRYL) 25 MG tablet Take 25 mg by mouth at bedtime as needed for allergies.    ezetimibe-simvastatin (VYTORIN) 10-20 MG per tablet Take 1 tablet by mouth at bedtime.      hydrochlorothiazide (HYDRODIURIL) 12.5 MG tablet Take 12.5 mg by mouth daily.      Iron TABS Take 1 tablet by mouth daily.    KLOR-CON 10 10 MEQ tablet Take 10 mEq by mouth daily.    methimazole (TAPAZOLE) 10 MG tablet Take 1 tablet (10 mg total) by mouth daily. Qty: 90 tablet, Refills: 1    metoprolol (TOPROL-XL) 50 MG 24 hr tablet Take 50 mg by mouth daily.      montelukast (SINGULAIR) 10 MG tablet Take 10 mg by mouth at bedtime.    Multiple Vitamin (MULTIVITAMIN) tablet Take 1 tablet by mouth daily.      SYMBICORT 160-4.5 MCG/ACT inhaler Inhale 2 puffs into the lungs 2 (two) times daily.    telmisartan (MICARDIS) 40 MG tablet Take 1/2 tablet by mouth once daily     triamcinolone ointment (KENALOG) 0.1 % Apply 1 application topically 2 (two) times daily.       Allergies  Allergen Reactions  . Cetirizine & Related     unknown  . Codeine   . Meperidine   . Meperidine Hcl     (Demerol)  . Sulfonamide Derivatives   . Nabumetone Rash    (Relafen)  . Naproxen Rash    REACTION: itching  . Sulfa Antibiotics Rash   Follow-up Information    Follow up with  Emeterio Reeve, MD. Go on 09/13/2015.   Specialty:  Family Medicine   Why:  at 11:30am   For Post Hospitalization Follow Up, Arrive 15 minutes prior to appointment, Bring a list of medications or bottles., Take your Discharge Instruction to your appt   Contact information:   [address] [place] [address] [phone]        The results of significant diagnostics from this hospitalization (including imaging, microbiology, ancillary and laboratory) are listed below for reference.    Significant Diagnostic Studies: Dg Chest 2 View  09/06/2015  CLINICAL DATA:  Cough, congestion, wheezing and shortness of breath EXAM: CHEST  2 VIEW COMPARISON:  09/02/2015 FINDINGS: The lungs are hyperinflated likely secondary to COPD. There is mild right basilar atelectasis. There is no focal parenchymal opacity. There is no pleural effusion or pneumothorax. The heart and mediastinal contours are unremarkable. The osseous structures are unremarkable. IMPRESSION: No active cardiopulmonary disease. Electronically Signed   By: Elige KoHetal  Patel   On: 09/06/2015 14:29   Dg Chest 2 View  09/02/2015  CLINICAL DATA:  Asthma, shortness of breath for 2 days, has used nebulizer and rescue inhaler today, having wheezing and crackles in all lung fields, productive cough for 1 week, nasal discharge, former smoker, has received albuterol, Atrovent and Solu-Medrol EXAM: CHEST  2 VIEW COMPARISON:  05/03/2009 FINDINGS: Normal heart size, mediastinal contours, and pulmonary vascularity. Minimal atherosclerotic calcification aortic arch. Lungs emphysematous but clear. No pleural effusion or pneumothorax. Bones unremarkable. IMPRESSION: COPD changes. No acute abnormalities. Electronically Signed   By: Ulyses SouthwardMark  Boles M.D.   On: 09/02/2015 14:27    Microbiology: No results found for this or any previous visit (from the past 240 hour(s)).   Labs: Basic Metabolic Panel:  Recent Labs Lab 09/02/15 1443  09/03/15 0433 09/04/15 0430 09/07/15 0445  NA 138 139 142 143  K 3.6 5.0 4.4 3.4*  CL 105 105 108 108  CO2 25 25 26 26   GLUCOSE 150* 155* 156* 93  BUN 10 14 16 18   CREATININE 0.56 0.62 0.71 0.67  CALCIUM 8.2* 8.8* 8.4* 8.5*   Liver Function Tests:  Recent Labs Lab 09/02/15 1443  AST 12*  ALT 12*  ALKPHOS 108  BILITOT 0.4  PROT 5.6*  ALBUMIN 2.6*   No results for input(s): LIPASE, AMYLASE in the last 168 hours. No results for input(s): AMMONIA in the last 168 hours. CBC:  Recent Labs Lab 09/02/15 1443 09/03/15 0540 09/04/15 0430 09/07/15 0445  WBC 5.8 3.3* 5.1 6.9  NEUTROABS 5.1  --   --   --   HGB 12.5 13.4 12.3 12.5  HCT 38.4 41.5 37.2 38.3  MCV 93.9 94.7 94.7 92.1  PLT 187 166 175 201   Cardiac Enzymes: No results for input(s): CKTOTAL, CKMB, CKMBINDEX, TROPONINI in the last 168 hours. BNP: BNP (last 3 results)  Recent Labs  09/02/15 1446 09/06/15 1200  BNP 202.0* 175.2*    ProBNP (last 3 results) No results for input(s): PROBNP in the last 8760 hours.  CBG: No results for input(s): GLUCAP in the last 168 hours.     Signed:  Rhetta MuraSAMTANI, JAI-GURMUKH MD  FACP  [facility] 09/07/2015, 8:56 AM

## 2015-09-18 ENCOUNTER — Emergency Department (HOSPITAL_COMMUNITY): Payer: Medicare Other

## 2015-09-18 ENCOUNTER — Encounter (HOSPITAL_COMMUNITY): Payer: Self-pay | Admitting: Emergency Medicine

## 2015-09-18 ENCOUNTER — Emergency Department (HOSPITAL_COMMUNITY)
Admission: EM | Admit: 2015-09-18 | Discharge: 2015-09-18 | Disposition: A | Discharge status: Home / Self Care | Payer: Medicare Other | Attending: Emergency Medicine | Admitting: Emergency Medicine

## 2015-09-18 DIAGNOSIS — E0590 Thyrotoxicosis, unspecified without thyrotoxic crisis or storm: Secondary | ICD-10-CM | POA: Diagnosis not present

## 2015-09-18 DIAGNOSIS — I10 Essential (primary) hypertension: Secondary | ICD-10-CM | POA: Insufficient documentation

## 2015-09-18 DIAGNOSIS — Z87891 Personal history of nicotine dependence: Secondary | ICD-10-CM | POA: Insufficient documentation

## 2015-09-18 DIAGNOSIS — Z8742 Personal history of other diseases of the female genital tract: Secondary | ICD-10-CM | POA: Diagnosis not present

## 2015-09-18 DIAGNOSIS — Z79899 Other long term (current) drug therapy: Secondary | ICD-10-CM | POA: Insufficient documentation

## 2015-09-18 DIAGNOSIS — J45909 Unspecified asthma, uncomplicated: Secondary | ICD-10-CM | POA: Insufficient documentation

## 2015-09-18 DIAGNOSIS — I951 Orthostatic hypotension: Secondary | ICD-10-CM | POA: Insufficient documentation

## 2015-09-18 DIAGNOSIS — M810 Age-related osteoporosis without current pathological fracture: Secondary | ICD-10-CM | POA: Insufficient documentation

## 2015-09-18 DIAGNOSIS — Z7951 Long term (current) use of inhaled steroids: Secondary | ICD-10-CM | POA: Insufficient documentation

## 2015-09-18 DIAGNOSIS — Z8673 Personal history of transient ischemic attack (TIA), and cerebral infarction without residual deficits: Secondary | ICD-10-CM | POA: Insufficient documentation

## 2015-09-18 DIAGNOSIS — Z7982 Long term (current) use of aspirin: Secondary | ICD-10-CM | POA: Insufficient documentation

## 2015-09-18 DIAGNOSIS — R079 Chest pain, unspecified: Secondary | ICD-10-CM | POA: Diagnosis present

## 2015-09-18 LAB — BASIC METABOLIC PANEL
Anion gap: 6 (ref 5–15)
BUN: 14 mg/dL (ref 6–20)
CO2: 27 mmol/L (ref 22–32)
Calcium: 7.7 mg/dL — ABNORMAL LOW (ref 8.9–10.3)
Chloride: 99 mmol/L — ABNORMAL LOW (ref 101–111)
Creatinine, Ser: 0.53 mg/dL (ref 0.44–1.00)
GFR calc Af Amer: >60 mL/min (ref >60)
GFR calc non Af Amer: >60 mL/min (ref >60)
Glucose, Bld: 121 mg/dL — ABNORMAL HIGH (ref 65–99)
Potassium: 3.6 mmol/L (ref 3.5–5.1)
Sodium: 132 mmol/L — ABNORMAL LOW (ref 135–145)

## 2015-09-18 LAB — URINE MICROSCOPIC-ADD ON

## 2015-09-18 LAB — URINALYSIS, ROUTINE W REFLEX MICROSCOPIC
Bilirubin Urine: NEGATIVE
Glucose, UA: NEGATIVE mg/dL
Ketones, ur: NEGATIVE mg/dL
Leukocytes, UA: NEGATIVE
Nitrite: NEGATIVE
Protein, ur: >300 mg/dL — AB
Specific Gravity, Urine: 1.008 (ref 1.005–1.030)
pH: 6.5 (ref 5.0–8.0)

## 2015-09-18 LAB — CBC
HCT: 36.6 % (ref 36.0–46.0)
Hemoglobin: 11.9 g/dL — ABNORMAL LOW (ref 12.0–15.0)
MCH: 30.8 pg (ref 26.0–34.0)
MCHC: 32.5 g/dL (ref 30.0–36.0)
MCV: 94.8 fL (ref 78.0–100.0)
Platelets: 188 10*3/uL (ref 150–400)
RBC: 3.86 MIL/uL — ABNORMAL LOW (ref 3.87–5.11)
RDW: 15.9 % — ABNORMAL HIGH (ref 11.5–15.5)
WBC: 7.7 10*3/uL (ref 4.0–10.5)

## 2015-09-18 LAB — I-STAT TROPONIN, ED: Troponin i, poc: 0.02 ng/mL (ref 0.00–0.08)

## 2015-09-18 LAB — CBG MONITORING, ED: Glucose-Capillary: 149 mg/dL — ABNORMAL HIGH (ref 65–99)

## 2015-09-18 MED ORDER — SODIUM CHLORIDE 0.9 % IV BOLUS (SEPSIS)
1000.0000 mL | Freq: Once | INTRAVENOUS | Status: AC
  Administered 2015-09-18: 1000 mL via INTRAVENOUS

## 2015-09-18 NOTE — ED Provider Notes (Signed)
CSN: [id number]     Arrival date & time 09/18/15  1010 History   First MD Initiated Contact with Patient 09/18/15 1052     Chief Complaint  Patient presents with  . Weakness  . Chest Pain     (Consider location/radiation/quality/duration/timing/severity/associated sxs/prior Treatment) Patient is a 80 y.o. female presenting with weakness.  Weakness This is a new problem. The problem occurs constantly. The problem has not changed since onset.Associated symptoms comments: Lightheadedness and feeling like she was going to pass out, did not lose consciousness. The symptoms are aggravated by exertion and standing. The symptoms are relieved by lying down. She has tried nothing for the symptoms.    Past Medical History  Diagnosis Date  . OSTEOPOROSIS 04/30/2007  . ASTHMA 05/03/2009  . HYPERTENSION 04/30/2007  . HYPERTHYROIDISM 06/17/2007  . Multinodular goiter   . Menopause   . Dyslipidemia   . CVA (cerebral infarction)    Past Surgical History  Procedure Laterality Date  . Other surgical history      discectomy  . Carotid artery surgery    . Neck surgery    . Back surgery     Family History  Problem Relation Age of Onset  . Heart disease Other    Social History  Substance Use Topics  . Smoking status: Former Games developer  . Smokeless tobacco: Never Used  . Alcohol Use: Yes     Comment: Socially   OB History    No data available     Review of Systems  Neurological: Positive for weakness.  All other systems reviewed and are negative.     Allergies  Cetirizine & related; Codeine; Meperidine; Meperidine hcl; Nabumetone; Naproxen; Sulfa antibiotics; and Sulfonamide derivatives  Home Medications   Prior to Admission medications   Medication Sig Start Date End Date Taking? Authorizing Provider  acetaminophen (TYLENOL) 325 MG tablet Take 650 mg by mouth every 6 (six) hours as needed for headache.   Yes Historical Provider, MD  albuterol (PROVENTIL HFA;VENTOLIN HFA) 108 (90  BASE) MCG/ACT inhaler Inhale 2 puffs into the lungs every 6 (six) hours as needed. Patient taking differently: Inhale 2 puffs into the lungs every 6 (six) hours as needed for wheezing or shortness of breath.  04/20/14  Yes Leslye Peer, MD  ALPRAZolam Prudy Feeler) 0.25 MG tablet Take 0.25 mg by mouth at bedtime as needed for anxiety.   Yes Historical Provider, MD  aspirin EC 81 MG tablet Take 81 mg by mouth daily.   Yes Historical Provider, MD  aspirin-acetaminophen-caffeine (EXCEDRIN MIGRAINE) [phone] MG tablet Take 2 tablets by mouth every 6 (six) hours as needed for headache or migraine.   Yes Historical Provider, MD  b complex vitamins tablet Take 1 tablet by mouth daily.     Yes Historical Provider, MD  Calcium Carbonate-Vitamin D (CALCIUM 500 + D PO) Take 1 tablet by mouth 2 (two) times daily.    Yes Historical Provider, MD  ezetimibe-simvastatin (VYTORIN) 10-20 MG per tablet Take 1 tablet by mouth at bedtime.     Yes Historical Provider, MD  hydrochlorothiazide (HYDRODIURIL) 12.5 MG tablet Take 12.5 mg by mouth daily.     Yes Historical Provider, MD  KLOR-CON 10 10 MEQ tablet Take 10 mEq by mouth daily. 08/29/15  Yes Historical Provider, MD  methimazole (TAPAZOLE) 10 MG tablet Take 1 tablet (10 mg total) by mouth daily. 07/10/15  Yes Romero Belling, MD  metoprolol (TOPROL-XL) 50 MG 24 hr tablet Take 50 mg by mouth daily.  Yes Historical Provider, MD  montelukast (SINGULAIR) 10 MG tablet Take 10 mg by mouth at bedtime.   Yes Historical Provider, MD  Multiple Vitamin (MULTIVITAMIN) tablet Take 1 tablet by mouth daily.     Yes Historical Provider, MD  SYMBICORT 160-4.5 MCG/ACT inhaler Inhale 2 puffs into the lungs 2 (two) times daily. 07/09/15  Yes Historical Provider, MD  telmisartan (MICARDIS) 40 MG tablet Take 1/2 tablet by mouth once daily    Yes Historical Provider, MD  levalbuterol (XOPENEX) 0.63 MG/3ML nebulizer solution Take 3 mLs (0.63 mg total) by nebulization every 4 (four)  hours. Patient not taking: Reported on 09/18/2015 09/07/15   Rhetta MuraJai-Gurmukh Samtani, MD   BP 132/68 mmHg  Pulse 68  Temp(Src) 97.7 F (36.5 C) (Oral)  Resp 16  SpO2 95% Physical Exam  Constitutional: She is oriented to person, place, and time. She appears well-developed and well-nourished. No distress.  HENT:  Head: Normocephalic.  Eyes: Conjunctivae are normal.  Neck: Neck supple. No tracheal deviation present.  Cardiovascular: Normal rate, regular rhythm and normal heart sounds.   Pulmonary/Chest: Effort normal and breath sounds normal. No respiratory distress.  Abdominal: Soft. She exhibits no distension. There is no tenderness.  Neurological: She is alert and oriented to person, place, and time. She has normal strength. No cranial nerve deficit. Coordination normal. GCS eye subscore is 4. GCS verbal subscore is 5. GCS motor subscore is 6.  Skin: Skin is warm and dry.  Psychiatric: She has a normal mood and affect.  Vitals reviewed.   ED Course  Procedures (including critical care time)  Emergency Focused Ultrasound Exam Limited Ultrasound of the Heart and Pericardium  Performed and interpreted by Dr. Clydene PughKnott Indication: orthostasis Multiple views of the heart, pericardium, and IVC are obtained with a multi frequency probe.  Findings: mildly reduced contractility, no anechoic fluid, complete IVC collapse Interpretation: mildly reduced ejection fraction, no pericardial effusion, depressed CVP Images archived electronically.  CPT Code: 1610993308   Labs Review Labs Reviewed  BASIC METABOLIC PANEL - Abnormal; Notable for the following:    Sodium 132 (*)    Chloride 99 (*)    Glucose, Bld 121 (*)    Calcium 7.7 (*)    All other components within normal limits  CBC - Abnormal; Notable for the following:    RBC 3.86 (*)    Hemoglobin 11.9 (*)    RDW 15.9 (*)    All other components within normal limits  URINALYSIS, ROUTINE W REFLEX MICROSCOPIC (NOT AT [facility]) - Abnormal; Notable  for the following:    APPearance CLOUDY (*)    Hgb urine dipstick SMALL (*)    Protein, ur >300 (*)    All other components within normal limits  URINE MICROSCOPIC-ADD ON - Abnormal; Notable for the following:    Squamous Epithelial / LPF 0-5 (*)    Bacteria, UA FEW (*)    All other components within normal limits  CBG MONITORING, ED - Abnormal; Notable for the following:    Glucose-Capillary 149 (*)    All other components within normal limits  I-STAT TROPOININ, ED    Imaging Review Dg Chest 2 View  09/18/2015  CLINICAL DATA:  Progressive chest pressure past 4 days. History of asthma EXAM: CHEST  2 VIEW COMPARISON:  September 06, 2015 FINDINGS: There is mild scarring in the bases. There is no edema or consolidation. The heart size and pulmonary vascularity are normal. No pneumothorax. There is mild degenerative change in the thoracic spine. IMPRESSION: Slight  bibasilar scarring.  No edema or consolidation. Electronically Signed   By: Bretta Bang III M.D.   On: 09/18/2015 11:00   I have personally reviewed and evaluated these images and lab results as part of my medical decision-making.   EKG Interpretation   Date/Time:  Tuesday September 18 2015 10:56:38 EST Ventricular Rate:  79 PR Interval:  91 QRS Duration: 81 QT Interval:  384 QTC Calculation: 440 R Axis:   3 Text Interpretation:  Ectopic atrial rhythm Atrial premature complex Short  PR interval Abnormal R-wave progression, early transition No significant  change since last tracing Confirmed by Chantee Cerino MD, Tillmon Kisling ([id number]) on  09/18/2015 11:13:14 AM      MDM   Final diagnoses:  Orthostasis    80 year old female presents with multiple risk factors for orthostasis and currently having orthostatic hypotension. She is on multiple antihypertensive agents that she has taken in the last 24 hours. She was treated with Lasix recently for an elevation in her BNP without previous echo to demonstrate presence congestive heart failure.  She recently stopped a prednisone burst for COPD exacerbation. These 3 things combined likely contributed to the patient's volume status and positional blood pressure, her IVC is completely collapsible on arrival and her symptoms are responsive to fluid resuscitation. No significant infectious, hematologic or metabolic abnormalities to explain symptoms. Patient is feeling much better following the fluids, ambulated in the emergency department without difficulty and agreed to follow up with her primary care physician closely within the next 2 days for a recheck. Asked Pt to hold her BP meds until able to follow up for medication reconciliation. Strict return precautions were discussed for worsening or new concerning symptoms.    Lyndal Pulley, MD 09/19/15 [phone]

## 2015-09-18 NOTE — ED Notes (Signed)
Pt states she collapsed this morning, did not lose consciousness, took her blood pressure and "it was really low (70/40)." Says she ate some breakfast and took her BP meds (metoprolol, HCTZ, Mycardis) and does feel better now. States she was recently tapered off of high doses of steroids d/t a severe asthma attack. States since coming off the steroids she's felt like she's been getting worse over the last 4 days. Complaining of mild chest tightness in triage, denies SOB, N/V/D

## 2015-09-18 NOTE — ED Notes (Signed)
Pt ambulated with out assistance to bathroom. RN notified

## 2015-09-18 NOTE — Discharge Instructions (Signed)
Orthostatic Hypotension Orthostatic hypotension is a sudden drop in blood pressure. It happens when you quickly stand up from a seated or lying position. You may feel dizzy or light-headed. This can last for just a few seconds or for up to a few minutes. It is usually not a serious problem. However, if this happens frequently or gets worse, it can be a sign of something more serious. CAUSES  Different things can cause orthostatic hypotension, including:   Loss of body fluids (dehydration).  Medicines that lower blood pressure.  Sudden changes in posture, such as standing up quickly after you have been sitting or lying down.  Taking too much of your medicine. SIGNS AND SYMPTOMS   Light-headedness or dizziness.   Fainting or near-fainting.   A fast heart rate.   Weakness.   Feeling tired (fatigue).  DIAGNOSIS  Your health care provider may do several things to help diagnose your condition and identify the cause. These may include:   Taking a medical history and doing a physical exam.  Checking your blood pressure. Your health care provider will check your blood pressure when you are:  Lying down.  Sitting.  Standing.  Using tilt table testing. In this test, you lie down on a table that moves from a lying position to a standing position. You will be strapped onto the table. This test monitors your blood pressure and heart rate when you are in different positions. TREATMENT  Treatment will vary depending on the cause. Possible treatments include:   Changing the dosage of your medicines.  Wearing compression stockings on your lower legs.  Standing up slowly after sitting or lying down.  Eating more salt.  Eating frequent, small meals.  In some cases, getting IV fluids.  Taking medicine to enhance fluid retention. HOME CARE INSTRUCTIONS  Only take over-the-counter or prescription medicines as directed by your health care provider.  Follow your health care  provider's instructions for changing the dosage of your current medicines.  Do not stop or adjust your medicine on your own.  Stand up slowly after sitting or lying down. This allows your body to adjust to the different position.  Wear compression stockings as directed.  Eat extra salt as directed.  Do not add extra salt to your diet unless directed to by your health care provider.  Eat frequent, small meals.  Avoid standing suddenly after eating.  Avoid hot showers or excessive heat as directed by your health care provider.  Keep all follow-up appointments. SEEK MEDICAL CARE IF:  You continue to feel dizzy or light-headed after standing.  You feel groggy or confused.  You feel cold, clammy, or sick to your stomach (nauseous).  You have blurred vision.  You feel short of breath. SEEK IMMEDIATE MEDICAL CARE IF:   You faint after standing.  You have chest pain.  You have difficulty breathing.   You lose feeling or movement in your arms or legs.   You have slurred speech or difficulty talking, or you are unable to talk.  MAKE SURE YOU:   Understand these instructions.  Will watch your condition.  Will get help right away if you are not doing well or get worse.   This information is not intended to replace advice given to you by your health care provider. Make sure you discuss any questions you have with your health care provider.   Document Released: 08/22/2002 Document Revised: 09/06/2013 Document Reviewed: 06/24/2013 Elsevier Interactive Patient Education 2016 [employer]   Please  do not take your blood pressure medication until you are able to restart them safely following a recheck of your blood pressure with your primary care provider.

## 2015-09-18 NOTE — ED Notes (Signed)
Pt ambulated to RR unassisted. No dizziness reported

## 2015-09-20 ENCOUNTER — Other Ambulatory Visit: Payer: Self-pay

## 2015-09-20 ENCOUNTER — Telehealth (HOSPITAL_COMMUNITY): Payer: Self-pay | Admitting: *Deleted

## 2015-09-20 ENCOUNTER — Other Ambulatory Visit (INDEPENDENT_AMBULATORY_CARE_PROVIDER_SITE_OTHER): Payer: Medicare Other

## 2015-09-20 DIAGNOSIS — E0520 Thyrotoxicosis with toxic multinodular goiter without thyrotoxic crisis or storm: Secondary | ICD-10-CM

## 2015-09-20 LAB — T4, FREE: Free T4: 0.98 ng/dL (ref 0.60–1.60)

## 2015-09-20 LAB — TSH: TSH: 2.43 u[IU]/mL (ref 0.35–4.50)

## 2015-09-21 ENCOUNTER — Other Ambulatory Visit: Payer: Medicare Other

## 2015-09-25 ENCOUNTER — Other Ambulatory Visit (HOSPITAL_COMMUNITY): Payer: Self-pay | Admitting: Family Medicine

## 2015-09-25 DIAGNOSIS — R7989 Other specified abnormal findings of blood chemistry: Secondary | ICD-10-CM

## 2015-10-02 ENCOUNTER — Other Ambulatory Visit: Payer: Self-pay

## 2015-10-02 ENCOUNTER — Ambulatory Visit (HOSPITAL_COMMUNITY): Payer: Medicare Other | Attending: Cardiology

## 2015-10-02 DIAGNOSIS — E785 Hyperlipidemia, unspecified: Secondary | ICD-10-CM | POA: Diagnosis not present

## 2015-10-02 DIAGNOSIS — I059 Rheumatic mitral valve disease, unspecified: Secondary | ICD-10-CM | POA: Insufficient documentation

## 2015-10-02 DIAGNOSIS — I358 Other nonrheumatic aortic valve disorders: Secondary | ICD-10-CM | POA: Diagnosis not present

## 2015-10-02 DIAGNOSIS — Z87891 Personal history of nicotine dependence: Secondary | ICD-10-CM | POA: Insufficient documentation

## 2015-10-02 DIAGNOSIS — R799 Abnormal finding of blood chemistry, unspecified: Secondary | ICD-10-CM | POA: Diagnosis not present

## 2015-10-02 DIAGNOSIS — I071 Rheumatic tricuspid insufficiency: Secondary | ICD-10-CM | POA: Diagnosis not present

## 2015-10-02 DIAGNOSIS — I10 Essential (primary) hypertension: Secondary | ICD-10-CM | POA: Diagnosis not present

## 2015-10-02 DIAGNOSIS — I517 Cardiomegaly: Secondary | ICD-10-CM | POA: Insufficient documentation

## 2015-10-02 DIAGNOSIS — I351 Nonrheumatic aortic (valve) insufficiency: Secondary | ICD-10-CM | POA: Insufficient documentation

## 2015-10-02 DIAGNOSIS — R7989 Other specified abnormal findings of blood chemistry: Secondary | ICD-10-CM

## 2015-10-12 ENCOUNTER — Other Ambulatory Visit: Payer: Self-pay | Admitting: Endocrinology

## 2015-11-05 ENCOUNTER — Other Ambulatory Visit: Payer: Self-pay

## 2015-11-05 DIAGNOSIS — Z1231 Encounter for screening mammogram for malignant neoplasm of breast: Secondary | ICD-10-CM

## 2015-11-10 ENCOUNTER — Other Ambulatory Visit: Payer: Self-pay | Admitting: Endocrinology

## 2015-12-03 ENCOUNTER — Ambulatory Visit
Admission: RE | Admit: 2015-12-03 | Discharge: 2015-12-03 | Disposition: A | Discharge status: Home / Self Care | Payer: Medicare Other | Source: Ambulatory Visit

## 2015-12-03 DIAGNOSIS — Z1231 Encounter for screening mammogram for malignant neoplasm of breast: Secondary | ICD-10-CM

## 2015-12-07 ENCOUNTER — Other Ambulatory Visit: Payer: Self-pay | Admitting: Endocrinology

## 2015-12-14 ENCOUNTER — Other Ambulatory Visit: Payer: Self-pay | Admitting: Endocrinology

## 2015-12-30 ENCOUNTER — Emergency Department (HOSPITAL_COMMUNITY)
Admission: EM | Admit: 2015-12-30 | Discharge: 2015-12-30 | Disposition: A | Discharge status: Home / Self Care | Payer: Medicare Other | Attending: Emergency Medicine | Admitting: Emergency Medicine

## 2015-12-30 ENCOUNTER — Encounter (HOSPITAL_COMMUNITY): Payer: Self-pay

## 2015-12-30 ENCOUNTER — Emergency Department (HOSPITAL_COMMUNITY): Payer: Medicare Other

## 2015-12-30 DIAGNOSIS — W010XXA Fall on same level from slipping, tripping and stumbling without subsequent striking against object, initial encounter: Secondary | ICD-10-CM | POA: Insufficient documentation

## 2015-12-30 DIAGNOSIS — S52615A Nondisplaced fracture of left ulna styloid process, initial encounter for closed fracture: Secondary | ICD-10-CM | POA: Insufficient documentation

## 2015-12-30 DIAGNOSIS — S6992XA Unspecified injury of left wrist, hand and finger(s), initial encounter: Secondary | ICD-10-CM | POA: Diagnosis present

## 2015-12-30 DIAGNOSIS — Z79899 Other long term (current) drug therapy: Secondary | ICD-10-CM | POA: Diagnosis not present

## 2015-12-30 DIAGNOSIS — S62102A Fracture of unspecified carpal bone, left wrist, initial encounter for closed fracture: Secondary | ICD-10-CM

## 2015-12-30 DIAGNOSIS — E785 Hyperlipidemia, unspecified: Secondary | ICD-10-CM | POA: Insufficient documentation

## 2015-12-30 DIAGNOSIS — Z7951 Long term (current) use of inhaled steroids: Secondary | ICD-10-CM | POA: Insufficient documentation

## 2015-12-30 DIAGNOSIS — Y998 Other external cause status: Secondary | ICD-10-CM | POA: Diagnosis not present

## 2015-12-30 DIAGNOSIS — Z7982 Long term (current) use of aspirin: Secondary | ICD-10-CM | POA: Diagnosis not present

## 2015-12-30 DIAGNOSIS — E0590 Thyrotoxicosis, unspecified without thyrotoxic crisis or storm: Secondary | ICD-10-CM | POA: Insufficient documentation

## 2015-12-30 DIAGNOSIS — Y9301 Activity, walking, marching and hiking: Secondary | ICD-10-CM | POA: Diagnosis not present

## 2015-12-30 DIAGNOSIS — Y9289 Other specified places as the place of occurrence of the external cause: Secondary | ICD-10-CM | POA: Diagnosis not present

## 2015-12-30 DIAGNOSIS — J45909 Unspecified asthma, uncomplicated: Secondary | ICD-10-CM | POA: Insufficient documentation

## 2015-12-30 DIAGNOSIS — I10 Essential (primary) hypertension: Secondary | ICD-10-CM | POA: Insufficient documentation

## 2015-12-30 DIAGNOSIS — S52572A Other intraarticular fracture of lower end of left radius, initial encounter for closed fracture: Secondary | ICD-10-CM | POA: Diagnosis not present

## 2015-12-30 DIAGNOSIS — Z8673 Personal history of transient ischemic attack (TIA), and cerebral infarction without residual deficits: Secondary | ICD-10-CM | POA: Insufficient documentation

## 2015-12-30 DIAGNOSIS — Z87891 Personal history of nicotine dependence: Secondary | ICD-10-CM | POA: Insufficient documentation

## 2015-12-30 DIAGNOSIS — M810 Age-related osteoporosis without current pathological fracture: Secondary | ICD-10-CM | POA: Diagnosis not present

## 2015-12-30 MED ORDER — HYDROCODONE-ACETAMINOPHEN 5-325 MG PO TABS
1.0000 | ORAL_TABLET | Freq: Once | ORAL | Status: AC
  Administered 2015-12-30: 1 via ORAL
  Filled 2015-12-30: qty 1

## 2015-12-30 MED ORDER — HYDROCODONE-ACETAMINOPHEN 5-325 MG PO TABS: 1.0000 | ORAL_TABLET | Freq: Four times a day (QID) | ORAL | Status: DC | PRN

## 2015-12-30 NOTE — ED Provider Notes (Signed)
CSN: [id number]     Arrival date & time 12/30/15  1050 History   First MD Initiated Contact with Patient 12/30/15 1149     Chief Complaint  Patient presents with  . Fall  . Wrist Pain    left     (Consider location/radiation/quality/duration/timing/severity/associated sxs/prior Treatment) Patient is a 80 y.o. female presenting with fall and wrist pain. The history is provided by the patient (Patient states she was walking the dog yesterday and fell and hurt her left wrist no other injuries).  Fall This is a new problem. The current episode started yesterday. The problem occurs constantly. The problem has not changed since onset.Pertinent negatives include no chest pain, no abdominal pain and no headaches. Exacerbated by: Movement of left breast. Nothing relieves the symptoms.  Wrist Pain Pertinent negatives include no chest pain, no abdominal pain and no headaches.    Past Medical History  Diagnosis Date  . OSTEOPOROSIS 04/30/2007  . ASTHMA 05/03/2009  . HYPERTENSION 04/30/2007  . HYPERTHYROIDISM 06/17/2007  . Multinodular goiter   . Menopause   . Dyslipidemia   . CVA (cerebral infarction)    Past Surgical History  Procedure Laterality Date  . Other surgical history      discectomy  . Carotid artery surgery    . Neck surgery    . Back surgery     Family History  Problem Relation Age of Onset  . Heart disease Other    Social History  Substance Use Topics  . Smoking status: Former Games developer  . Smokeless tobacco: Never Used  . Alcohol Use: Yes     Comment: Socially   OB History    No data available     Review of Systems  Constitutional: Negative for appetite change and fatigue.  HENT: Negative for congestion, ear discharge and sinus pressure.   Eyes: Negative for discharge.  Respiratory: Negative for cough.   Cardiovascular: Negative for chest pain.  Gastrointestinal: Negative for abdominal pain and diarrhea.  Genitourinary: Negative for frequency and hematuria.   Musculoskeletal: Negative for back pain.       Left wrist painful and swollen  Skin: Negative for rash.  Neurological: Negative for seizures and headaches.  Psychiatric/Behavioral: Negative for hallucinations.      Allergies  Cetirizine & related; Codeine; Meperidine; Meperidine hcl; Nabumetone; Naproxen; Sulfa antibiotics; and Sulfonamide derivatives  Home Medications   Prior to Admission medications   Medication Sig Start Date End Date Taking? Authorizing Provider  albuterol (PROVENTIL HFA;VENTOLIN HFA) 108 (90 BASE) MCG/ACT inhaler Inhale 2 puffs into the lungs every 6 (six) hours as needed. Patient taking differently: Inhale 2 puffs into the lungs every 6 (six) hours as needed for wheezing or shortness of breath.  04/20/14  Yes Leslye Peer, MD  ALPRAZolam Prudy Feeler) 0.25 MG tablet Take 0.25 mg by mouth at bedtime as needed for anxiety.   Yes Historical Provider, MD  aspirin EC 81 MG tablet Take 81 mg by mouth daily.   Yes Historical Provider, MD  b complex vitamins tablet Take 1 tablet by mouth daily.     Yes Historical Provider, MD  Calcium Carbonate-Vitamin D (CALCIUM 500 + D PO) Take 1 tablet by mouth 2 (two) times daily.    Yes Historical Provider, MD  ezetimibe-simvastatin (VYTORIN) 10-20 MG per tablet Take 1 tablet by mouth daily after breakfast.    Yes Historical Provider, MD  ibuprofen (ADVIL,MOTRIN) 200 MG tablet Take 600 mg by mouth every 6 (six) hours as needed for fever,  headache, mild pain, moderate pain or cramping.   Yes Historical Provider, MD  levalbuterol (XOPENEX) 0.63 MG/3ML nebulizer solution Take 3 mLs (0.63 mg total) by nebulization every 4 (four) hours. Patient taking differently: Take 0.63 mg by nebulization every 6 (six) hours as needed for wheezing or shortness of breath.  09/07/15  Yes Rhetta Mura, MD  methimazole (TAPAZOLE) 10 MG tablet Take 1 tablet (10 mg total) by mouth daily. 07/10/15  Yes Romero Belling, MD  montelukast (SINGULAIR) 10 MG tablet  Take 10 mg by mouth at bedtime.   Yes Historical Provider, MD  Multiple Vitamin (MULTIVITAMIN) tablet Take 1 tablet by mouth daily.     Yes Historical Provider, MD  SYMBICORT 160-4.5 MCG/ACT inhaler Inhale 2 puffs into the lungs 2 (two) times daily. 07/09/15  Yes Historical Provider, MD  telmisartan (MICARDIS) 40 MG tablet Take 20 mg by mouth daily.    Yes Historical Provider, MD  HYDROcodone-acetaminophen (NORCO/VICODIN) 5-325 MG tablet Take 1 tablet by mouth every 6 (six) hours as needed for moderate pain. 12/30/15   Bethann Berkshire, MD  methimazole (TAPAZOLE) 10 MG tablet TAKE 1 TABLET EVERY DAY Patient not taking: Reported on 12/30/2015 12/14/15   Romero Belling, MD   BP 186/104 mmHg  Pulse 73  Temp(Src) 97.5 F (36.4 C) (Oral)  Resp 18  SpO2 100% Physical Exam  Constitutional: She is oriented to person, place, and time. She appears well-developed.  HENT:  Head: Normocephalic.  Eyes: Conjunctivae are normal.  Neck: No tracheal deviation present.  Cardiovascular:  No murmur heard. Musculoskeletal:  Patient is tenderness and swelling to left wrist neurovascular exams normal  Neurological: She is oriented to person, place, and time.  Skin: Skin is warm.  Psychiatric: She has a normal mood and affect.    ED Course  Procedures (including critical care time) Labs Review Labs Reviewed - No data to display  Imaging Review Dg Wrist Complete Left  12/30/2015  CLINICAL DATA:  Fall with left wrist pain EXAM: LEFT WRIST - COMPLETE 3+ VIEW COMPARISON:  None. FINDINGS: Comminuted intra-articular left distal radius fracture with mild impaction, minimal apex volar angulation and no significant displacement. Nondisplaced left ulnar styloid fracture. Prominent diffuse left wrist soft tissue swelling. No dislocation. No focal osseous lesion. Mild-to-moderate osteoarthritis at the first carpometacarpal joint. Diffuse osteopenia. Nonspecific soft tissue calcifications in the ulnar/dorsal left distal  forearm. IMPRESSION: 1. Comminuted nondisplaced intra-articular left distal radius fracture as described. 2. Nondisplaced left ulnar styloid fracture. 3. Diffuse osteopenia. Electronically Signed   By: Delbert Phenix M.D.   On: 12/30/2015 12:16   Dg Hand Complete Left  12/30/2015  CLINICAL DATA:  Fall with left hand/wrist pain EXAM: LEFT HAND - COMPLETE 3+ VIEW COMPARISON:  None. FINDINGS: Comminuted intra-articular left distal radius fracture. Nondisplaced left ulnar styloid fracture. Prominent diffuse left wrist soft tissue swelling. No additional fracture in the left hand. No dislocation. Diffuse osteopenia. Prominent erosive osteoarthritis in the distal interphalangeal joint of the left second finger. IMPRESSION: 1. Comminuted intra-articular left distal radius fracture. Nondisplaced left ulnar styloid fracture. Please see separate concurrent left wrist radiograph report for further details. 2. No additional fracture or malalignment in the left and . 3. Diffuse osteopenia . 4. Erosive osteoarthritis in the DIP joint of the left second finger. Electronically Signed   By: Delbert Phenix M.D.   On: 12/30/2015 12:15   I have personally reviewed and evaluated these images and lab results as part of my medical decision-making.   EKG Interpretation None  MDM   Final diagnoses:  Wrist fracture, left, closed, initial encounter    Fracture left wrist closed. Patient has been splinted given pain medicine and will follow-up with her orthopedic doctor    Bethann BerkshireJoseph Tremain Rucinski, MD 12/30/15 1249

## 2015-12-30 NOTE — ED Notes (Signed)
Quint -ortho applied before discharge. Good CMS- Pt tolerated. Sling also applied.

## 2015-12-30 NOTE — ED Notes (Signed)
NO JEWELRY

## 2015-12-30 NOTE — Discharge Instructions (Signed)
Follow-up with her orthopedic doctor next week, keep arm elevated

## 2015-12-30 NOTE — ED Notes (Signed)
Pt report fell yesterday (tripped walking dog) NO LOC. Swelling noted to left hand, wrist and forearm. No other complaints

## 2016-02-22 ENCOUNTER — Ambulatory Visit: Payer: Medicare Other | Admitting: Endocrinology

## 2016-02-29 ENCOUNTER — Encounter: Payer: Self-pay | Admitting: Endocrinology

## 2016-02-29 ENCOUNTER — Ambulatory Visit (INDEPENDENT_AMBULATORY_CARE_PROVIDER_SITE_OTHER): Payer: Medicare Other | Admitting: Endocrinology

## 2016-02-29 ENCOUNTER — Ambulatory Visit: Payer: Medicare Other | Admitting: Endocrinology

## 2016-02-29 VITALS — BP 122/78 | HR 70 | Temp 98.6°F | Ht 63.0 in | Wt 132.0 lb

## 2016-02-29 DIAGNOSIS — E0520 Thyrotoxicosis with toxic multinodular goiter without thyrotoxic crisis or storm: Secondary | ICD-10-CM | POA: Diagnosis not present

## 2016-02-29 LAB — TSH: TSH: 1.28 u[IU]/mL (ref 0.35–4.50)

## 2016-02-29 NOTE — Patient Instructions (Addendum)
blood tests are being requested for you today.  We'll contact you with results. Please come back for a follow-up appointment in 6 months. if ever you have fever while taking methimazole, stop it and call us, because of the risk of a rare side-effect.   

## 2016-02-29 NOTE — Progress Notes (Signed)
Subjective:    Patient ID: Ariel Lambert, female    DOB: [birth year], 80 y.o.   MRN: [id number]  HPI Pt returns for f/u of hyperthyroidism due to small multinodular goiter (dx'ed approx 1999; she chose chronic tapazole rx; most recent US was in 2015--no change; it has been well-controlled with tapazole).  pt states she feels well in general, and she takes tapazole as rx'ed).   Past Medical History  Diagnosis Date  . OSTEOPOROSIS 04/30/2007  . ASTHMA 05/03/2009  . HYPERTENSION 04/30/2007  . HYPERTHYROIDISM 06/17/2007  . Multinodular goiter   . Menopause   . Dyslipidemia   . CVA (cerebral infarction)     Past Surgical History  Procedure Laterality Date  . Other surgical history      discectomy  . Carotid artery surgery    . Neck surgery    . Back surgery      Social History   Social History  . Marital Status: Widowed    Spouse Name: N/A  . Number of Children: N/A  . Years of Education: N/A   Occupational History  . Teacher    Social History Main Topics  . Smoking status: Former Games developer  . Smokeless tobacco: Never Used  . Alcohol Use: 0.0 oz/week    0 Standard drinks or equivalent per week     Comment: Socially  . Drug Use: No  . Sexual Activity: Not on file   Other Topics Concern  . Not on file   Social History Narrative   Lives alone.    Current Outpatient Prescriptions on File Prior to Visit  Medication Sig Dispense Refill  . albuterol (PROVENTIL HFA;VENTOLIN HFA) 108 (90 BASE) MCG/ACT inhaler Inhale 2 puffs into the lungs every 6 (six) hours as needed. (Patient taking differently: Inhale 2 puffs into the lungs every 6 (six) hours as needed for wheezing or shortness of breath. ) 1 Inhaler 6  . ALPRAZolam (XANAX) 0.25 MG tablet Take 0.25 mg by mouth at bedtime as needed for anxiety.    Marland Kitchen aspirin EC 81 MG tablet Take 81 mg by mouth daily.    Marland Kitchen b complex vitamins tablet Take 1 tablet by mouth daily.      . Calcium Carbonate-Vitamin D (CALCIUM 500 + D PO)  Take 1 tablet by mouth 2 (two) times daily.     Marland Kitchen ezetimibe-simvastatin (VYTORIN) 10-20 MG per tablet Take 1 tablet by mouth daily after breakfast.     . ibuprofen (ADVIL,MOTRIN) 200 MG tablet Take 600 mg by mouth every 6 (six) hours as needed for fever, headache, mild pain, moderate pain or cramping.    . levalbuterol (XOPENEX) 0.63 MG/3ML nebulizer solution Take 3 mLs (0.63 mg total) by nebulization every 4 (four) hours. (Patient taking differently: Take 0.63 mg by nebulization every 6 (six) hours as needed for wheezing or shortness of breath. ) 3 mL 12  . methimazole (TAPAZOLE) 10 MG tablet Take 1 tablet (10 mg total) by mouth daily. 90 tablet 1  . montelukast (SINGULAIR) 10 MG tablet Take 10 mg by mouth at bedtime.    . Multiple Vitamin (MULTIVITAMIN) tablet Take 1 tablet by mouth daily.      . SYMBICORT 160-4.5 MCG/ACT inhaler Inhale 2 puffs into the lungs 2 (two) times daily.    Marland Kitchen telmisartan (MICARDIS) 40 MG tablet Take 20 mg by mouth daily.      No current facility-administered medications on file prior to visit.    Allergies  Allergen Reactions  . Cetirizine &  Related     unknown  . Codeine   . Meperidine Diarrhea and Nausea And Vomiting  . Meperidine Hcl Diarrhea and Nausea And Vomiting    (Demerol)  . Nabumetone Rash    (Relafen)  . Naproxen Rash    REACTION: itching  . Sulfa Antibiotics Rash  . Sulfonamide Derivatives Rash    Family History  Problem Relation Age of Onset  . Heart disease Other     BP 122/78 mmHg  Pulse 70  Temp(Src) 98.6 F (37 C) (Oral)  Ht 5\' 3"  (1.6 m)  Wt 132 lb (59.875 kg)  BMI 23.39 kg/m2  SpO2 94%   Review of Systems Denies fever.     Objective:   Physical Exam VITAL SIGNS:  See vs page. GENERAL: no distress. NECK: slightly enlarged thyroid, with irregular surface.    Lab Results  Component Value Date   TSH 1.28 02/29/2016      Assessment & Plan:  Hyperthyroidism: well-controlled Small multinodular goiter, clinically  unchanged  Patient is advised the following: Patient Instructions  blood tests are being requested for you today.  We'll contact you with results.   Please come back for a follow-up appointment in 6 months.   if ever you have fever while taking methimazole, stop it and call us, because of the risk of a rare side-effect.     Romero BellingELLISON, Ariel Newill, MD

## 2016-03-03 ENCOUNTER — Telehealth: Payer: Self-pay | Admitting: Endocrinology

## 2016-03-03 NOTE — Telephone Encounter (Signed)
Called patient, left message to call back. No response as of yet.  °

## 2016-06-08 ENCOUNTER — Other Ambulatory Visit: Payer: Self-pay | Admitting: Endocrinology

## 2016-08-29 ENCOUNTER — Encounter: Payer: Self-pay | Admitting: Endocrinology

## 2016-08-29 ENCOUNTER — Ambulatory Visit (INDEPENDENT_AMBULATORY_CARE_PROVIDER_SITE_OTHER): Payer: Medicare Other | Admitting: Endocrinology

## 2016-08-29 VITALS — BP 116/70 | HR 75 | Ht 63.0 in | Wt 135.0 lb

## 2016-08-29 DIAGNOSIS — E0520 Thyrotoxicosis with toxic multinodular goiter without thyrotoxic crisis or storm: Secondary | ICD-10-CM | POA: Diagnosis not present

## 2016-08-29 LAB — TSH: TSH: 3.08 u[IU]/mL (ref 0.35–4.50)

## 2016-08-29 NOTE — Patient Instructions (Signed)
blood tests are being requested for you today.  We'll contact you with results. Please come back for a follow-up appointment in 6 months. if ever you have fever while taking methimazole, stop it and call us, because of the risk of a rare side-effect.   

## 2016-08-29 NOTE — Progress Notes (Signed)
Subjective:    Patient ID: Ariel SpannerElizabeth A Bubar, female    DOB: 1935-11-04, 80 y.o.   MRN: [id number]  HPI Pt returns for f/u of hyperthyroidism due to small multinodular goiter (dx'ed approx 1999; she chose chronic tapazole rx; most recent US was in 2015--no change; it has been well-controlled with tapazole).  pt states she feels well in general, and she takes tapazole as rx'ed.   Past Medical History:  Diagnosis Date  . ASTHMA 05/03/2009  . CVA (cerebral infarction)   . Dyslipidemia   . HYPERTENSION 04/30/2007  . HYPERTHYROIDISM 06/17/2007  . Menopause   . Multinodular goiter   . OSTEOPOROSIS 04/30/2007    Past Surgical History:  Procedure Laterality Date  . BACK SURGERY    . carotid artery surgery    . NECK SURGERY    . OTHER SURGICAL HISTORY     discectomy    Social History   Social History  . Marital status: Widowed    Spouse name: N/A  . Number of children: N/A  . Years of education: N/A   Occupational History  . Teacher Retired   Social History Main Topics  . Smoking status: Former Games developermoker  . Smokeless tobacco: Never Used  . Alcohol use 0.0 oz/week     Comment: Socially  . Drug use: No  . Sexual activity: Not on file   Other Topics Concern  . Not on file   Social History Narrative   Lives alone.    Current Outpatient Prescriptions on File Prior to Visit  Medication Sig Dispense Refill  . albuterol (PROVENTIL HFA;VENTOLIN HFA) 108 (90 BASE) MCG/ACT inhaler Inhale 2 puffs into the lungs every 6 (six) hours as needed. (Patient taking differently: Inhale 2 puffs into the lungs every 6 (six) hours as needed for wheezing or shortness of breath. ) 1 Inhaler 6  . ALPRAZolam (XANAX) 0.25 MG tablet Take 0.25 mg by mouth at bedtime as needed for anxiety.    Marland Kitchen. aspirin EC 81 MG tablet Take 81 mg by mouth daily.    Marland Kitchen. b complex vitamins tablet Take 1 tablet by mouth daily.      . Calcium Carbonate-Vitamin D (CALCIUM 500 + D PO) Take 1 tablet by mouth 2 (two) times  daily.     Marland Kitchen. ezetimibe-simvastatin (VYTORIN) 10-20 MG per tablet Take 1 tablet by mouth daily after breakfast.     . ibuprofen (ADVIL,MOTRIN) 200 MG tablet Take 600 mg by mouth every 6 (six) hours as needed for fever, headache, mild pain, moderate pain or cramping.    . levalbuterol (XOPENEX) 0.63 MG/3ML nebulizer solution Take 3 mLs (0.63 mg total) by nebulization every 4 (four) hours. (Patient taking differently: Take 0.63 mg by nebulization every 6 (six) hours as needed for wheezing or shortness of breath. ) 3 mL 12  . methimazole (TAPAZOLE) 10 MG tablet TAKE 1 TABLET (10 MG TOTAL) BY MOUTH DAILY. 90 tablet 1  . montelukast (SINGULAIR) 10 MG tablet Take 10 mg by mouth at bedtime.    . Multiple Vitamin (MULTIVITAMIN) tablet Take 1 tablet by mouth daily.      . SYMBICORT 160-4.5 MCG/ACT inhaler Inhale 2 puffs into the lungs 2 (two) times daily.    Marland Kitchen. telmisartan (MICARDIS) 40 MG tablet Take 20 mg by mouth daily.      No current facility-administered medications on file prior to visit.     Allergies  Allergen Reactions  . Cetirizine & Related     unknown  . Codeine   .  Meperidine Diarrhea and Nausea And Vomiting  . Meperidine Hcl Diarrhea and Nausea And Vomiting    (Demerol)  . Nabumetone Rash    (Relafen)  . Naproxen Rash    REACTION: itching  . Sulfa Antibiotics Rash  . Sulfonamide Derivatives Rash    Family History  Problem Relation Age of Onset  . Heart disease Other    BP 116/70   Pulse 75   Ht 5\' 3"  (1.6 m)   Wt 135 lb (61.2 kg)   SpO2 96%   BMI 23.91 kg/m   Review of Systems She denies fever    Objective:   Physical Exam VITAL SIGNS:  See vs page. GENERAL: no distress. NECK: small multinodular goiter.    Lab Results  Component Value Date   TSH 3.08 08/29/2016      Assessment & Plan:  Hyperthyroidism: well-controlled. Please continue the same medication Please come back for a follow-up appointment in 6 months

## 2016-11-17 ENCOUNTER — Other Ambulatory Visit: Payer: Self-pay | Admitting: Family Medicine

## 2016-11-17 DIAGNOSIS — Z1231 Encounter for screening mammogram for malignant neoplasm of breast: Secondary | ICD-10-CM

## 2016-12-05 ENCOUNTER — Ambulatory Visit
Admission: RE | Admit: 2016-12-05 | Discharge: 2016-12-05 | Disposition: A | Discharge status: Home / Self Care | Payer: Medicare Other | Source: Ambulatory Visit | Attending: Family Medicine | Admitting: Family Medicine

## 2016-12-05 DIAGNOSIS — Z1231 Encounter for screening mammogram for malignant neoplasm of breast: Secondary | ICD-10-CM

## 2016-12-07 ENCOUNTER — Other Ambulatory Visit: Payer: Self-pay | Admitting: Endocrinology

## 2017-03-13 ENCOUNTER — Encounter: Payer: Self-pay | Admitting: Endocrinology

## 2017-03-13 ENCOUNTER — Ambulatory Visit (INDEPENDENT_AMBULATORY_CARE_PROVIDER_SITE_OTHER): Payer: Medicare Other | Admitting: Endocrinology

## 2017-03-13 VITALS — BP 120/88 | HR 70 | Ht 63.0 in | Wt 135.0 lb

## 2017-03-13 DIAGNOSIS — E0520 Thyrotoxicosis with toxic multinodular goiter without thyrotoxic crisis or storm: Secondary | ICD-10-CM

## 2017-03-13 LAB — TSH: TSH: 2.17 u[IU]/mL (ref 0.35–4.50)

## 2017-03-13 LAB — T4, FREE: Free T4: 0.96 ng/dL (ref 0.60–1.60)

## 2017-03-13 NOTE — Progress Notes (Signed)
Subjective:    Patient ID: Ariel Lambert, female    DOB: 01-Aug-1936, 81 y.o.   MRN: [id number]  HPI Pt returns for f/u of hyperthyroidism due to small multinodular goiter (dx'ed approx 1999; she chose chronic tapazole rx; most recent US was in 2015--no change; it has been well-controlled with tapazole).  pt states she feels well in general, and she takes tapazole as rx'ed.   Past Medical History:  Diagnosis Date  . ASTHMA 05/03/2009  . CVA (cerebral infarction)   . Dyslipidemia   . HYPERTENSION 04/30/2007  . HYPERTHYROIDISM 06/17/2007  . Menopause   . Multinodular goiter   . OSTEOPOROSIS 04/30/2007    Past Surgical History:  Procedure Laterality Date  . BACK SURGERY    . BREAST EXCISIONAL BIOPSY Left   . carotid artery surgery    . NECK SURGERY    . OTHER SURGICAL HISTORY     discectomy    Social History   Social History  . Marital status: Widowed    Spouse name: N/A  . Number of children: N/A  . Years of education: N/A   Occupational History  . Teacher Retired   Social History Main Topics  . Smoking status: Former Games developer  . Smokeless tobacco: Never Used  . Alcohol use 0.0 oz/week     Comment: Socially  . Drug use: No  . Sexual activity: Not on file   Other Topics Concern  . Not on file   Social History Narrative   Lives alone.    Current Outpatient Prescriptions on File Prior to Visit  Medication Sig Dispense Refill  . albuterol (PROVENTIL HFA;VENTOLIN HFA) 108 (90 BASE) MCG/ACT inhaler Inhale 2 puffs into the lungs every 6 (six) hours as needed. (Patient taking differently: Inhale 2 puffs into the lungs every 6 (six) hours as needed for wheezing or shortness of breath. ) 1 Inhaler 6  . ALPRAZolam (XANAX) 0.25 MG tablet Take 0.25 mg by mouth at bedtime as needed for anxiety.    Marland Kitchen aspirin EC 81 MG tablet Take 81 mg by mouth daily.    Marland Kitchen b complex vitamins tablet Take 1 tablet by mouth daily.      . Calcium Carbonate-Vitamin D (CALCIUM 500 + D PO)  Take 1 tablet by mouth 2 (two) times daily.     Marland Kitchen ezetimibe-simvastatin (VYTORIN) 10-20 MG per tablet Take 1 tablet by mouth daily after breakfast.     . ibuprofen (ADVIL,MOTRIN) 200 MG tablet Take 600 mg by mouth every 6 (six) hours as needed for fever, headache, mild pain, moderate pain or cramping.    . levalbuterol (XOPENEX) 0.63 MG/3ML nebulizer solution Take 3 mLs (0.63 mg total) by nebulization every 4 (four) hours. (Patient taking differently: Take 0.63 mg by nebulization every 6 (six) hours as needed for wheezing or shortness of breath. ) 3 mL 12  . methimazole (TAPAZOLE) 10 MG tablet TAKE 1 TABLET BY MOUTH EVERY DAY 90 tablet 1  . montelukast (SINGULAIR) 10 MG tablet Take 10 mg by mouth at bedtime.    . Multiple Vitamin (MULTIVITAMIN) tablet Take 1 tablet by mouth daily.      . SYMBICORT 160-4.5 MCG/ACT inhaler Inhale 2 puffs into the lungs 2 (two) times daily.    Marland Kitchen telmisartan (MICARDIS) 40 MG tablet Take 40 mg by mouth daily. Take 1 tablet daily     No current facility-administered medications on file prior to visit.     Allergies  Allergen Reactions  . Cetirizine & Related  unknown  . Codeine   . Meperidine Diarrhea and Nausea And Vomiting  . Meperidine Hcl Diarrhea and Nausea And Vomiting    (Demerol)  . Nabumetone Rash    (Relafen)  . Naproxen Rash    REACTION: itching  . Sulfa Antibiotics Rash  . Sulfonamide Derivatives Rash    Family History  Problem Relation Age of Onset  . Heart disease Other     BP 120/88   Pulse 70   Ht 5\' 3"  (1.6 m)   Wt 135 lb (61.2 kg)   SpO2 95%   BMI 23.91 kg/m    Review of Systems Denies fever.     Objective:   Physical Exam VITAL SIGNS:  See vs page. GENERAL: no distress.  NECK: multinodular goiter (R>L) is again noted  Lab Results  Component Value Date   TSH 2.17 03/13/2017      Assessment & Plan:  Hyperthyroidism: well-controlled.  Please continue the same medication Multinodular goiter: clincally stable.   We'll follow

## 2017-03-13 NOTE — Patient Instructions (Signed)
blood tests are being requested for you today.  We'll contact you with results. Please come back for a follow-up appointment in 6 months. if ever you have fever while taking methimazole, stop it and call us, because of the risk of a rare side-effect.   

## 2017-04-16 ENCOUNTER — Other Ambulatory Visit: Payer: Self-pay | Admitting: Endocrinology

## 2017-09-18 ENCOUNTER — Ambulatory Visit: Payer: Medicare Other | Admitting: Endocrinology

## 2017-09-18 ENCOUNTER — Encounter: Payer: Self-pay | Admitting: Endocrinology

## 2017-09-18 VITALS — BP 104/80 | HR 62 | Wt 134.4 lb

## 2017-09-18 DIAGNOSIS — E0520 Thyrotoxicosis with toxic multinodular goiter without thyrotoxic crisis or storm: Secondary | ICD-10-CM

## 2017-09-18 LAB — TSH: TSH: 1.8 u[IU]/mL (ref 0.35–4.50)

## 2017-09-18 NOTE — Patient Instructions (Signed)
blood tests are being requested for you today.  We'll contact you with results. Please come back for a follow-up appointment in 6 months. if ever you have fever while taking methimazole, stop it and call us, because of the risk of a rare side-effect.   

## 2017-09-18 NOTE — Progress Notes (Signed)
Subjective:    Patient ID: Ariel Lambert, female    DOB: 1935-09-24, 82 y.o.   MRN: [id number]  HPI Pt returns for f/u of hyperthyroidism due to small multinodular goiter (dx'ed approx 1999; she chose chronic tapazole rx; most recent US was in 2015--no change; it has been well-controlled with tapazole).  pt states she feels well in general, and she takes tapazole as rx'ed.   Past Medical History:  Diagnosis Date  . ASTHMA 05/03/2009  . CVA (cerebral infarction)   . Dyslipidemia   . HYPERTENSION 04/30/2007  . HYPERTHYROIDISM 06/17/2007  . Menopause   . Multinodular goiter   . OSTEOPOROSIS 04/30/2007    Past Surgical History:  Procedure Laterality Date  . BACK SURGERY    . BREAST EXCISIONAL BIOPSY Left   . carotid artery surgery    . NECK SURGERY    . OTHER SURGICAL HISTORY     discectomy    Social History   Socioeconomic History  . Marital status: Widowed    Spouse name: Not on file  . Number of children: Not on file  . Years of education: Not on file  . Highest education level: Not on file  Social Needs  . Financial resource strain: Not on file  . Food insecurity - worry: Not on file  . Food insecurity - inability: Not on file  . Transportation needs - medical: Not on file  . Transportation needs - non-medical: Not on file  Occupational History  . Occupation: Magazine features editor: RETIRED  Tobacco Use  . Smoking status: Former Games developer  . Smokeless tobacco: Never Used  Substance and Sexual Activity  . Alcohol use: Yes    Alcohol/week: 0.0 oz    Comment: Socially  . Drug use: No  . Sexual activity: Not on file  Other Topics Concern  . Not on file  Social History Narrative   Lives alone.    Current Outpatient Medications on File Prior to Visit  Medication Sig Dispense Refill  . albuterol (PROVENTIL HFA;VENTOLIN HFA) 108 (90 BASE) MCG/ACT inhaler Inhale 2 puffs into the lungs every 6 (six) hours as needed. (Patient taking differently: Inhale 2 puffs  into the lungs every 6 (six) hours as needed for wheezing or shortness of breath. ) 1 Inhaler 6  . ALPRAZolam (XANAX) 0.25 MG tablet Take 0.25 mg by mouth at bedtime as needed for anxiety.    Marland Kitchen aspirin EC 81 MG tablet Take 81 mg by mouth daily.    Marland Kitchen b complex vitamins tablet Take 1 tablet by mouth daily.      . Calcium Carbonate-Vitamin D (CALCIUM 500 + D PO) Take 1 tablet by mouth 2 (two) times daily.     Marland Kitchen ezetimibe-simvastatin (VYTORIN) 10-20 MG per tablet Take 1 tablet by mouth daily after breakfast.     . ibuprofen (ADVIL,MOTRIN) 200 MG tablet Take 600 mg by mouth every 6 (six) hours as needed for fever, headache, mild pain, moderate pain or cramping.    . levalbuterol (XOPENEX) 0.63 MG/3ML nebulizer solution Take 3 mLs (0.63 mg total) by nebulization every 4 (four) hours. (Patient taking differently: Take 0.63 mg by nebulization every 6 (six) hours as needed for wheezing or shortness of breath. ) 3 mL 12  . methimazole (TAPAZOLE) 10 MG tablet TAKE 1 TABLET BY MOUTH EVERY DAY 90 tablet 1  . montelukast (SINGULAIR) 10 MG tablet Take 10 mg by mouth at bedtime.    . Multiple Vitamin (MULTIVITAMIN) tablet Take 1  tablet by mouth daily.      . SYMBICORT 160-4.5 MCG/ACT inhaler Inhale 2 puffs into the lungs 2 (two) times daily.    Marland Kitchen. telmisartan (MICARDIS) 40 MG tablet Take 40 mg by mouth 2 (two) times daily. Take 1 tablet in the morning and one in the evening (80mg  total daily).     No current facility-administered medications on file prior to visit.     Allergies  Allergen Reactions  . Cetirizine & Related     unknown  . Codeine   . Meperidine Diarrhea and Nausea And Vomiting  . Meperidine Hcl Diarrhea and Nausea And Vomiting    (Demerol)  . Nabumetone Rash    (Relafen)  . Naproxen Rash    REACTION: itching  . Sulfa Antibiotics Rash  . Sulfonamide Derivatives Rash    Family History  Problem Relation Age of Onset  . Heart disease Other     BP 104/80 (BP Location: Left Arm, Patient  Position: Sitting, Cuff Size: Normal)   Pulse 62   Wt 134 lb 6.4 oz (61 kg)   SpO2 96%   BMI 23.81 kg/m    Review of Systems Denies fever    Objective:   Physical Exam VITAL SIGNS:  See vs page GENERAL: no distress NECK: multiple thyroid nodules are again palpable (R>L)      Assessment & Plan:  Hyperthyroidism, due for recheck. Multinodular goiter: clinically stable.   Patient Instructions  blood tests are being requested for you today.  We'll contact you with results.   Please come back for a follow-up appointment in 6 months.   if ever you have fever while taking methimazole, stop it and call us, because of the risk of a rare side-effect.

## 2017-09-20 ENCOUNTER — Encounter (HOSPITAL_COMMUNITY): Payer: Self-pay

## 2017-09-20 ENCOUNTER — Emergency Department (HOSPITAL_COMMUNITY): Payer: Medicare Other

## 2017-09-20 ENCOUNTER — Emergency Department (HOSPITAL_COMMUNITY)
Admission: EM | Admit: 2017-09-20 | Discharge: 2017-09-20 | Disposition: A | Discharge status: Home / Self Care | Payer: Medicare Other | Attending: Emergency Medicine | Admitting: Emergency Medicine

## 2017-09-20 DIAGNOSIS — J45901 Unspecified asthma with (acute) exacerbation: Secondary | ICD-10-CM

## 2017-09-20 DIAGNOSIS — J449 Chronic obstructive pulmonary disease, unspecified: Secondary | ICD-10-CM | POA: Diagnosis not present

## 2017-09-20 DIAGNOSIS — Z87891 Personal history of nicotine dependence: Secondary | ICD-10-CM | POA: Diagnosis not present

## 2017-09-20 DIAGNOSIS — I10 Essential (primary) hypertension: Secondary | ICD-10-CM | POA: Insufficient documentation

## 2017-09-20 DIAGNOSIS — B9789 Other viral agents as the cause of diseases classified elsewhere: Secondary | ICD-10-CM

## 2017-09-20 DIAGNOSIS — J4541 Moderate persistent asthma with (acute) exacerbation: Secondary | ICD-10-CM | POA: Diagnosis not present

## 2017-09-20 DIAGNOSIS — Z79899 Other long term (current) drug therapy: Secondary | ICD-10-CM | POA: Insufficient documentation

## 2017-09-20 DIAGNOSIS — J069 Acute upper respiratory infection, unspecified: Secondary | ICD-10-CM | POA: Insufficient documentation

## 2017-09-20 DIAGNOSIS — E0590 Thyrotoxicosis, unspecified without thyrotoxic crisis or storm: Secondary | ICD-10-CM | POA: Diagnosis not present

## 2017-09-20 DIAGNOSIS — R0602 Shortness of breath: Secondary | ICD-10-CM | POA: Diagnosis present

## 2017-09-20 LAB — CBC WITH DIFFERENTIAL/PLATELET
Basophils Absolute: 0 10*3/uL (ref 0.0–0.1)
Basophils Relative: 1 %
Eosinophils Absolute: 0.1 10*3/uL (ref 0.0–0.7)
Eosinophils Relative: 1 %
HCT: 39.9 % (ref 36.0–46.0)
Hemoglobin: 13 g/dL (ref 12.0–15.0)
Lymphocytes Relative: 19 %
Lymphs Abs: 1.1 10*3/uL (ref 0.7–4.0)
MCH: 30.5 pg (ref 26.0–34.0)
MCHC: 32.6 g/dL (ref 30.0–36.0)
MCV: 93.7 fL (ref 78.0–100.0)
Monocytes Absolute: 0.5 10*3/uL (ref 0.1–1.0)
Monocytes Relative: 9 %
Neutro Abs: 3.9 10*3/uL (ref 1.7–7.7)
Neutrophils Relative %: 70 %
Platelets: 185 10*3/uL (ref 150–400)
RBC: 4.26 MIL/uL (ref 3.87–5.11)
RDW: 16.2 % — ABNORMAL HIGH (ref 11.5–15.5)
WBC: 5.5 10*3/uL (ref 4.0–10.5)

## 2017-09-20 LAB — RESPIRATORY PANEL BY PCR

## 2017-09-20 LAB — COMPREHENSIVE METABOLIC PANEL
ALT: 13 U/L — ABNORMAL LOW (ref 14–54)
AST: 13 U/L — ABNORMAL LOW (ref 15–41)
Albumin: 3.1 g/dL — ABNORMAL LOW (ref 3.5–5.0)
Alkaline Phosphatase: 107 U/L (ref 38–126)
Anion gap: 10 (ref 5–15)
BUN: 18 mg/dL (ref 6–20)
CO2: 22 mmol/L (ref 22–32)
Calcium: 8.7 mg/dL — ABNORMAL LOW (ref 8.9–10.3)
Chloride: 102 mmol/L (ref 101–111)
Creatinine, Ser: 0.92 mg/dL (ref 0.44–1.00)
GFR calc Af Amer: >60 mL/min (ref >60)
GFR calc non Af Amer: 58 mL/min — ABNORMAL LOW (ref >60)
Glucose, Bld: 133 mg/dL — ABNORMAL HIGH (ref 65–99)
Potassium: 3.1 mmol/L — ABNORMAL LOW (ref 3.5–5.1)
Sodium: 134 mmol/L — ABNORMAL LOW (ref 135–145)
Total Bilirubin: 0.8 mg/dL (ref 0.3–1.2)
Total Protein: 6.3 g/dL — ABNORMAL LOW (ref 6.5–8.1)

## 2017-09-20 LAB — I-STAT CG4 LACTIC ACID, ED: Lactic Acid, Venous: 1.25 mmol/L (ref 0.5–1.9)

## 2017-09-20 LAB — TROPONIN I: Troponin I: <0.03 ng/mL (ref <0.03)

## 2017-09-20 MED ORDER — METHYLPREDNISOLONE SODIUM SUCC 125 MG IJ SOLR
125.0000 mg | Freq: Once | INTRAMUSCULAR | Status: AC
  Administered 2017-09-20: 125 mg via INTRAVENOUS
  Filled 2017-09-20: qty 2

## 2017-09-20 MED ORDER — MAGNESIUM SULFATE 2 GM/50ML IV SOLN
2.0000 g | Freq: Once | INTRAVENOUS | Status: AC
  Administered 2017-09-20: 2 g via INTRAVENOUS
  Filled 2017-09-20: qty 50

## 2017-09-20 MED ORDER — IPRATROPIUM-ALBUTEROL 0.5-2.5 (3) MG/3ML IN SOLN
3.0000 mL | Freq: Once | RESPIRATORY_TRACT | Status: AC
  Administered 2017-09-20: 3 mL via RESPIRATORY_TRACT
  Filled 2017-09-20: qty 3

## 2017-09-20 MED ORDER — PREDNISONE 20 MG PO TABS: 40.0000 mg | ORAL_TABLET | Freq: Every day | ORAL | 0 refills | Status: DC

## 2017-09-20 NOTE — ED Triage Notes (Signed)
Patient complains of SOB with increased congestion and cough x 2 days, [facility], had xray prior to transfer. Placed on oxygen at 2l

## 2017-09-20 NOTE — ED Notes (Signed)
Patient transported to x-ray. ?

## 2017-09-20 NOTE — Discharge Instructions (Signed)
USE ALBUTEROL OR XOPENEX EVERY 4 TO 6 HOURS FOR THE NEXT 2 DAYS AND THEN SPACE OUT AS YOU TOLERATE IT. FOLLOW UP WITH PRIMARY CARE DOCTOR IN A FEW DAYS. TAKE STEROIDS ONCE DAILY UNTIL FINISHED. RETURN TO ER IF ANY FEVERS, WORSENING BREATHING PROBLEMS, CHEST PAIN, OR SUDDEN CHANGE IN SYMPTOMS.

## 2017-09-20 NOTE — ED Provider Notes (Signed)
[facility] EMERGENCY DEPARTMENT Provider Note   CSN: [id number] Arrival date & time: 09/20/17  1450     History   Chief Complaint Chief Complaint  Patient presents with  . Shortness of Breath    HPI Ariel Lambert is a 82 y.o. female.  82 year old female with past medical history including asthma, CVA, hypertension, thyroid problems who presents with shortness of breath and cough.  Patient has had 4 days of cough and congestion.  Over the past 2 days she has had worsening shortness of breath.  She has been using albuterol and xopenex without relief.  No fevers, vomiting, diarrhea, sick contacts, or recent travel.  She was evaluated at urgent care and sent here for further workup.  No associated chest pain.  No leg swelling, history of cancer, history of blood clots.   The history is provided by the patient.    Past Medical History:  Diagnosis Date  . ASTHMA 05/03/2009  . CVA (cerebral infarction)   . Dyslipidemia   . HYPERTENSION 04/30/2007  . HYPERTHYROIDISM 06/17/2007  . Menopause   . Multinodular goiter   . OSTEOPOROSIS 04/30/2007    Patient Active Problem List   Diagnosis Date Noted  . COPD exacerbation (HCC) 09/02/2015  . Asthma exacerbation 09/02/2015  . Allergic rhinitis 03/16/2013  . 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 ASTHMA 05/03/2009  . HYPOKALEMIA 06/13/2008  . Thyrotoxicosis 06/17/2007  . Essential hypertension 04/30/2007  . OSTEOPOROSIS 04/30/2007    Past Surgical History:  Procedure Laterality Date  . BACK SURGERY    . BREAST EXCISIONAL BIOPSY Left   . carotid artery surgery    . NECK SURGERY    . OTHER SURGICAL HISTORY     discectomy    OB History    No data available       Home Medications    Prior to Admission medications   Medication Sig Start Date End Date Taking? Authorizing Provider  albuterol (PROVENTIL HFA;VENTOLIN HFA) 108 (90 BASE) MCG/ACT inhaler Inhale 2 puffs into the lungs every 6 (six)  hours as needed. Patient taking differently: Inhale 2 puffs into the lungs every 6 (six) hours as needed for wheezing or shortness of breath.  04/20/14  Yes Leslye Peer, MD  ALPRAZolam Prudy Feeler) 0.25 MG tablet Take 0.25 mg by mouth at bedtime as needed for anxiety.   Yes [provider]  amoxicillin (AMOXIL) 250 MG capsule Take 250 mg by mouth 3 (three) times daily. FOR 5 DAYS 09/17/17 09/21/17 Yes [provider]  aspirin EC 81 MG tablet Take 81 mg by mouth daily.   Yes [provider]  b complex vitamins tablet Take 1 tablet by mouth daily.     Yes [provider]  Calcium Carbonate-Vitamin D (CALCIUM 500 + D PO) Take 1 tablet by mouth 2 (two) times daily.    Yes [provider]  ezetimibe-simvastatin (VYTORIN) 10-20 MG per tablet Take 1 tablet by mouth daily after breakfast.    Yes [provider]  ibuprofen (ADVIL,MOTRIN) 200 MG tablet Take 600 mg by mouth every 6 (six) hours as needed (for pain or headaches).    Yes [provider]  levalbuterol (XOPENEX) 0.63 MG/3ML nebulizer solution Take 3 mLs (0.63 mg total) by nebulization every 4 (four) hours. Patient taking differently: Take 0.63 mg by nebulization every 6 (six) hours as needed for wheezing or shortness of breath.  09/07/15  Yes Rhetta Mura, MD  methimazole (TAPAZOLE) 10 MG tablet TAKE 1  TABLET BY MOUTH EVERY DAY Patient taking differently: Take 10 mg by mouth once a day 04/16/17  Yes Romero Belling, MD  montelukast (SINGULAIR) 10 MG tablet Take 10 mg by mouth at bedtime.   Yes [provider]  Multiple Vitamin (MULTIVITAMIN) tablet Take 1 tablet by mouth daily.     Yes [provider]  SYMBICORT 160-4.5 MCG/ACT inhaler Inhale 2 puffs into the lungs 2 (two) times daily. 07/09/15  Yes [provider]  telmisartan (MICARDIS) 40 MG tablet Take 40 mg by mouth 2 (two) times daily.    Yes [provider]  UNKNOWN TO PATIENT Unnamed tablet for  urinary issues and/or bladder control: Take 1 tablet bu mouth once a day   Yes [provider]  predniSONE (DELTASONE) 20 MG tablet Take 2 tablets (40 mg total) by mouth daily. 09/20/17   Trishelle Devora, Ambrose Finland, MD    Family History Family History  Problem Relation Age of Onset  . Heart disease Other     Social History Social History   Tobacco Use  . Smoking status: Former Games developer  . Smokeless tobacco: Never Used  Substance Use Topics  . Alcohol use: Yes    Alcohol/week: 0.0 oz    Comment: Socially  . Drug use: No     Allergies   Cetirizine & related; Codeine; Meperidine; Meperidine hcl; Nabumetone; Naproxen; Sulfa antibiotics; and Sulfonamide derivatives   Review of Systems Review of Systems All other systems reviewed and are negative except that which was mentioned in HPI   Physical Exam Updated Vital Signs BP 121/75 (BP Location: Right Arm)   Pulse 77   Temp 98 F (36.7 C) (Oral)   Resp 16   SpO2 94%   Physical Exam  Constitutional: She is oriented to person, place, and time. She appears well-developed and well-nourished. No distress.  HENT:  Head: Normocephalic and atraumatic.  Mouth/Throat: Oropharynx is clear and moist.  Moist mucous membranes  Eyes: Conjunctivae are normal. Pupils are equal, round, and reactive to light.  Neck: Neck supple.  Cardiovascular: Normal rate, regular rhythm and normal heart sounds.  No murmur heard. Pulmonary/Chest: Effort normal. No respiratory distress. She has wheezes.  Inspiratory and expiratory wheezes b/l  Abdominal: Soft. Bowel sounds are normal. She exhibits no distension. There is no tenderness.  Musculoskeletal: She exhibits no edema.  Neurological: She is alert and oriented to person, place, and time.  Fluent speech  Skin: Skin is warm and dry.  Psychiatric: She has a normal mood and affect. Judgment normal.  Nursing note and vitals reviewed.    ED Treatments / Results  Labs (all labs ordered are  listed, but only abnormal results are displayed) Labs Reviewed  RESPIRATORY PANEL BY PCR - Abnormal; Notable for the following components:      Result Value   Metapneumovirus DETECTED (*)    All other components within normal limits  COMPREHENSIVE METABOLIC PANEL - Abnormal; Notable for the following components:   Sodium 134 (*)    Potassium 3.1 (*)    Glucose, Bld 133 (*)    Calcium 8.7 (*)    Total Protein 6.3 (*)    Albumin 3.1 (*)    AST 13 (*)    ALT 13 (*)    GFR calc non Af Amer 58 (*)    All other components within normal limits  CBC WITH DIFFERENTIAL/PLATELET - Abnormal; Notable for the following components:   RDW 16.2 (*)    All other components within normal limits  TROPONIN I  I-STAT CG4 LACTIC ACID, ED  I-STAT CG4 LACTIC ACID, ED    EKG  EKG Interpretation  Date/Time:  Sunday September 20 2017 14:55:24 EST Ventricular Rate:  79 PR Interval:  130 QRS Duration: 78 QT Interval:  404 QTC Calculation: 463 R Axis:   -24 Text Interpretation:  Normal sinus rhythm Nonspecific ST and T wave abnormality Abnormal ECG No significant change since last tracing Confirmed by Kalvin Buss ([id number]) on 09/20/2017 4:19:14 PM       Radiology Dg Chest 2 View  Result Date: 09/20/2017 CLINICAL DATA:  Patient complains of SOB with increased congestion and cough x 2 days EXAM: CHEST  2 VIEW COMPARISON:  09/18/2015 FINDINGS: The heart size and mediastinal contours are within normal limits. Both lungs are clear. The visualized skeletal structures are unremarkable. IMPRESSION: No active cardiopulmonary disease. Electronically Signed   By: Elsa  Brown M.D.   On: 09/20/2017 18:19    Procedures Procedures (including critical care time)  Medications Ordered in ED Medications  magnesium sulfate IVPB 2 g 50 mL (0 g Intravenous Stopped 09/20/17 1815)  methylPREDNISolone sodium succinate (SOLU-MEDROL) 125 mg/2 mL injection 125 mg (125 mg Intravenous Given 09/20/17 1654)    ipratropium-albuterol (DUONEB) 0.5-2.5 (3) MG/3ML nebulizer solution 3 mL (3 mLs Nebulization Given 09/20/17 1700)     Initial Impression / Assessment and Plan / ED Course  I have reviewed the triage vital signs and the nursing notes.  Pertinent labs & imaging results that were available during my care of the patient were reviewed by me and considered in my medical decision making (see chart for details).     PT comfortable and non-toxic on exam, initially on 2L O2  with O2 sat mid-90s. Afebrile.  She had ends between expiratory wheezes but no respiratory distress.  Chest x-ray clear.  Gave the patient Solu-Medrol, magnesium, and DuoNeb with improvement in her wheezing.  She reported improvement in her breathing.  Labs notable for RVP positive for Metapneumovirus, neg trop, normal CBC and reassuring CMP.  Took patient off supplemental oxygen and she was maintaining O2 sats with sats 90% or greater during ambulation. She felt much improved and wanted to go home to treat her symptoms.  She has albuterol to use at home, provided with steroid course.  Instructed to see PCP in a few days for reassessment.  Extensively reviewed return precautions.  She and daughter voiced understanding and she was discharged in satisfactory condition.  Final Clinical Impressions(s) / ED Diagnoses   Final diagnoses:  Viral URI with cough  Moderate asthma with exacerbation, unspecified whether persistent    ED Discharge Orders        Ordered    predniSONE (DELTASONE) 20 MG tablet  Daily     01 /06/19 2112       Alucard Fearnow, Ambrose Finlandachel Morgan, MD 09/20/17 2120

## 2017-09-20 NOTE — ED Notes (Signed)
Patient on room air - SpO2 maintaining 94-95% at rest. Will ambulate with pulse ox.

## 2017-09-20 NOTE — ED Notes (Signed)
Pt was able to stand and use bedside toilet with one assist, tolerated well. 

## 2017-09-20 NOTE — ED Notes (Signed)
Patient ambulated to restroom with pulse ox - SpO2 maintained 92-94% while walking. Pt denies worsening SOB (from her norm) and states that she feels much better than when she came in earlier today. MD aware.

## 2017-09-20 NOTE — ED Notes (Signed)
Patient verbalized understanding of discharge instructions and denies any further needs or questions at this time. VS stable. Patient ambulatory with steady gait.  

## 2017-09-20 NOTE — ED Notes (Signed)
MD at bedside. 

## 2017-10-29 ENCOUNTER — Other Ambulatory Visit: Payer: Self-pay | Admitting: Family Medicine

## 2017-10-29 DIAGNOSIS — Z1231 Encounter for screening mammogram for malignant neoplasm of breast: Secondary | ICD-10-CM

## 2017-10-29 DIAGNOSIS — E2839 Other primary ovarian failure: Secondary | ICD-10-CM

## 2017-11-21 ENCOUNTER — Other Ambulatory Visit: Payer: Self-pay | Admitting: Endocrinology

## 2017-11-30 ENCOUNTER — Ambulatory Visit: Payer: Medicare Other | Admitting: Endocrinology

## 2017-11-30 ENCOUNTER — Encounter: Payer: Self-pay | Admitting: Endocrinology

## 2017-11-30 VITALS — BP 132/84 | HR 79 | Ht 63.0 in | Wt 133.0 lb

## 2017-11-30 DIAGNOSIS — E0520 Thyrotoxicosis with toxic multinodular goiter without thyrotoxic crisis or storm: Secondary | ICD-10-CM

## 2017-11-30 LAB — TSH: TSH: 0.83 u[IU]/mL (ref 0.35–4.50)

## 2017-11-30 LAB — T4, FREE: Free T4: 1.24 ng/dL (ref 0.60–1.60)

## 2017-11-30 NOTE — Patient Instructions (Signed)
blood tests are being requested for you today.  We'll contact you with results.   If ever you have fever while taking methimazole, stop it and call us, even if the reason is obvious, because of the risk of a rare side-effect. Please come back for a follow-up appointment in 6 months.

## 2017-11-30 NOTE — Progress Notes (Signed)
Subjective:    Patient ID: Ariel Lambert, female    DOB: 1935-10-09, 82 y.o.   MRN: [id number]  HPI Pt returns for f/u of hyperthyroidism due to small multinodular goiter (dx'ed approx 1999; she chose chronic tapazole rx; most recent US was in 2015--no change; it has been well-controlled with tapazole).  She has night sweats Past Medical History:  Diagnosis Date  . ASTHMA 05/03/2009  . CVA (cerebral infarction)   . Dyslipidemia   . HYPERTENSION 04/30/2007  . HYPERTHYROIDISM 06/17/2007  . Menopause   . Multinodular goiter   . OSTEOPOROSIS 04/30/2007    Past Surgical History:  Procedure Laterality Date  . BACK SURGERY    . BREAST EXCISIONAL BIOPSY Left   . carotid artery surgery    . NECK SURGERY    . OTHER SURGICAL HISTORY     discectomy    Social History   Socioeconomic History  . Marital status: Widowed    Spouse name: Not on file  . Number of children: Not on file  . Years of education: Not on file  . Highest education level: Not on file  Social Needs  . Financial resource strain: Not on file  . Food insecurity - worry: Not on file  . Food insecurity - inability: Not on file  . Transportation needs - medical: Not on file  . Transportation needs - non-medical: Not on file  Occupational History  . Occupation: Magazine features editor: RETIRED  Tobacco Use  . Smoking status: Former Games developer  . Smokeless tobacco: Never Used  Substance and Sexual Activity  . Alcohol use: Yes    Alcohol/week: 0.0 oz    Comment: Socially  . Drug use: No  . Sexual activity: Not on file  Other Topics Concern  . Not on file  Social History Narrative   Lives alone.    Current Outpatient Medications on File Prior to Visit  Medication Sig Dispense Refill  . albuterol (PROVENTIL HFA;VENTOLIN HFA) 108 (90 BASE) MCG/ACT inhaler Inhale 2 puffs into the lungs every 6 (six) hours as needed. (Patient taking differently: Inhale 2 puffs into the lungs every 6 (six) hours as needed for  wheezing or shortness of breath. ) 1 Inhaler 6  . ALPRAZolam (XANAX) 0.25 MG tablet Take 0.25 mg by mouth at bedtime as needed for anxiety.    Marland Kitchen aspirin EC 81 MG tablet Take 81 mg by mouth daily.    Marland Kitchen b complex vitamins tablet Take 1 tablet by mouth daily.      . Calcium Carbonate-Vitamin D (CALCIUM 500 + D PO) Take 1 tablet by mouth 2 (two) times daily.     Marland Kitchen ezetimibe-simvastatin (VYTORIN) 10-20 MG per tablet Take 1 tablet by mouth daily after breakfast.     . ibuprofen (ADVIL,MOTRIN) 200 MG tablet Take 600 mg by mouth every 6 (six) hours as needed (for pain or headaches).     Marland Kitchen levalbuterol (XOPENEX) 0.63 MG/3ML nebulizer solution Take 3 mLs (0.63 mg total) by nebulization every 4 (four) hours. (Patient taking differently: Take 0.63 mg by nebulization every 6 (six) hours as needed for wheezing or shortness of breath. ) 3 mL 12  . methimazole (TAPAZOLE) 10 MG tablet TAKE 1 TABLET BY MOUTH EVERY DAY 90 tablet 1  . Mirabegron (MYRBETRIQ PO) Take by mouth.    . montelukast (SINGULAIR) 10 MG tablet Take 10 mg by mouth at bedtime.    . Multiple Vitamin (MULTIVITAMIN) tablet Take 1 tablet by mouth daily.      Marland Kitchen  predniSONE (DELTASONE) 20 MG tablet Take 2 tablets (40 mg total) by mouth daily. 10 tablet 0  . SYMBICORT 160-4.5 MCG/ACT inhaler Inhale 2 puffs into the lungs 2 (two) times daily.    Marland Kitchen. telmisartan (MICARDIS) 40 MG tablet Take 40 mg by mouth 2 (two) times daily.     Marland Kitchen. UNKNOWN TO PATIENT Unnamed tablet for urinary issues and/or bladder control: Take 1 tablet bu mouth once a day     No current facility-administered medications on file prior to visit.     Allergies  Allergen Reactions  . Cetirizine & Related Other (See Comments)    Stomach pains  . Codeine Itching    Severe itching  . Meperidine Diarrhea and Nausea And Vomiting  . Meperidine Hcl Diarrhea and Nausea And Vomiting    (Demerol)  . Nabumetone Rash    (Relafen)  . Naproxen Itching and Rash  . Sulfa Antibiotics Rash  .  Sulfonamide Derivatives Rash    Family History  Problem Relation Age of Onset  . Heart disease Other     BP 132/84   Pulse 79   Ht 5\' 3"  (1.6 m)   Wt 133 lb (60.3 kg)   SpO2 98%   BMI 23.56 kg/m   Review of Systems Denies fever    Objective:   Physical Exam VITAL SIGNS:  See vs page GENERAL: no distress NECK: multiple thyroid nodules are noted (R>L).        Assessment & Plan:  Hyperthyroidism, due for recheck  Patient Instructions  blood tests are being requested for you today.  We'll contact you with results.   If ever you have fever while taking methimazole, stop it and call us, even if the reason is obvious, because of the risk of a rare side-effect. Please come back for a follow-up appointment in 6 months.

## 2017-12-01 ENCOUNTER — Telehealth: Payer: Self-pay | Admitting: Endocrinology

## 2017-12-01 NOTE — Telephone Encounter (Signed)
Patient would like someone to call with her lab results.   Please advise

## 2017-12-01 NOTE — Telephone Encounter (Signed)
I called LVM that labs were normal & she could call back if she had any further questions.

## 2017-12-07 ENCOUNTER — Ambulatory Visit
Admission: RE | Admit: 2017-12-07 | Discharge: 2017-12-07 | Disposition: A | Discharge status: Home / Self Care | Payer: Medicare Other | Source: Ambulatory Visit | Attending: Family Medicine | Admitting: Family Medicine

## 2017-12-07 DIAGNOSIS — E2839 Other primary ovarian failure: Secondary | ICD-10-CM

## 2017-12-07 DIAGNOSIS — Z1231 Encounter for screening mammogram for malignant neoplasm of breast: Secondary | ICD-10-CM

## 2018-03-19 ENCOUNTER — Ambulatory Visit: Payer: Medicare Other | Admitting: Endocrinology

## 2018-04-15 ENCOUNTER — Ambulatory Visit: Payer: Medicare Other | Admitting: Endocrinology

## 2018-04-15 VITALS — BP 138/88 | HR 62 | Temp 97.7°F | Ht 63.0 in | Wt 135.0 lb

## 2018-04-15 DIAGNOSIS — E0520 Thyrotoxicosis with toxic multinodular goiter without thyrotoxic crisis or storm: Secondary | ICD-10-CM

## 2018-04-15 LAB — TSH: TSH: 4.45 u[IU]/mL (ref 0.35–4.50)

## 2018-04-15 NOTE — Patient Instructions (Signed)
blood tests are being requested for you today.  We'll contact you with results.   If ever you have fever while taking methimazole, stop it and call us, even if the reason is obvious, because of the risk of a rare side-effect. Please come back for a follow-up appointment in 6 months.

## 2018-04-15 NOTE — Progress Notes (Signed)
Subjective:    Patient ID: Ariel Lambert, female    DOB: 1936-06-30, 82 y.o.   MRN: [id number]  HPI Pt returns for f/u of hyperthyroidism due to small multinodular goiter (dx'ed approx 1999; she chose chronic tapazole rx; most recent US was in 2015--no change; it has been well-controlled with tapazole).  pt states she feels well in general.   Past Medical History:  Diagnosis Date  . ASTHMA 05/03/2009  . CVA (cerebral infarction)   . Dyslipidemia   . HYPERTENSION 04/30/2007  . HYPERTHYROIDISM 06/17/2007  . Menopause   . Multinodular goiter   . OSTEOPOROSIS 04/30/2007    Past Surgical History:  Procedure Laterality Date  . BACK SURGERY    . BREAST EXCISIONAL BIOPSY Left   . carotid artery surgery    . NECK SURGERY    . OTHER SURGICAL HISTORY     discectomy    Social History   Socioeconomic History  . Marital status: Widowed    Spouse name: Not on file  . Number of children: Not on file  . Years of education: Not on file  . Highest education level: Not on file  Occupational History  . Occupation: Magazine features editor: RETIRED  Social Needs  . Financial resource strain: Not on file  . Food insecurity:    Worry: Not on file    Inability: Not on file  . Transportation needs:    Medical: Not on file    Non-medical: Not on file  Tobacco Use  . Smoking status: Former Games developer  . Smokeless tobacco: Never Used  Substance and Sexual Activity  . Alcohol use: Yes    Alcohol/week: 0.0 oz    Comment: Socially  . Drug use: No  . Sexual activity: Not on file  Lifestyle  . Physical activity:    Days per week: Not on file    Minutes per session: Not on file  . Stress: Not on file  Relationships  . Social connections:    Talks on phone: Not on file    Gets together: Not on file    Attends religious service: Not on file    Active member of club or organization: Not on file    Attends meetings of clubs or organizations: Not on file    Relationship status: Not on file   . Intimate partner violence:    Fear of current or ex partner: Not on file    Emotionally abused: Not on file    Physically abused: Not on file    Forced sexual activity: Not on file  Other Topics Concern  . Not on file  Social History Narrative   Lives alone.    Current Outpatient Medications on File Prior to Visit  Medication Sig Dispense Refill  . albuterol (PROVENTIL HFA;VENTOLIN HFA) 108 (90 BASE) MCG/ACT inhaler Inhale 2 puffs into the lungs every 6 (six) hours as needed. (Patient taking differently: Inhale 2 puffs into the lungs every 6 (six) hours as needed for wheezing or shortness of breath. ) 1 Inhaler 6  . ALPRAZolam (XANAX) 0.25 MG tablet Take 0.25 mg by mouth at bedtime as needed for anxiety.    Marland Kitchen b complex vitamins tablet Take 1 tablet by mouth daily.      . Calcium Carbonate-Vitamin D (CALCIUM 500 + D PO) Take 1 tablet by mouth 2 (two) times daily.     Marland Kitchen ezetimibe-simvastatin (VYTORIN) 10-20 MG per tablet Take 1 tablet by mouth daily after breakfast.     .  ibuprofen (ADVIL,MOTRIN) 200 MG tablet Take 600 mg by mouth every 6 (six) hours as needed (for pain or headaches).     Marland Kitchen. levalbuterol (XOPENEX) 0.63 MG/3ML nebulizer solution Take 3 mLs (0.63 mg total) by nebulization every 4 (four) hours. (Patient taking differently: Take 0.63 mg by nebulization every 6 (six) hours as needed for wheezing or shortness of breath. ) 3 mL 12  . methimazole (TAPAZOLE) 10 MG tablet TAKE 1 TABLET BY MOUTH EVERY DAY 90 tablet 1  . Mirabegron (MYRBETRIQ PO) Take by mouth.    . montelukast (SINGULAIR) 10 MG tablet Take 10 mg by mouth at bedtime.    . Multiple Vitamin (MULTIVITAMIN) tablet Take 1 tablet by mouth daily.      . SYMBICORT 160-4.5 MCG/ACT inhaler Inhale 2 puffs into the lungs 2 (two) times daily.    Marland Kitchen. telmisartan (MICARDIS) 40 MG tablet Take 40 mg by mouth 2 (two) times daily.     Marland Kitchen. UNKNOWN TO PATIENT Unnamed tablet for urinary issues and/or bladder control: Take 1 tablet bu mouth  once a day     No current facility-administered medications on file prior to visit.     Allergies  Allergen Reactions  . Cetirizine & Related Other (See Comments)    Stomach pains  . Codeine Itching    Severe itching  . Meperidine Diarrhea and Nausea And Vomiting  . Meperidine Hcl Diarrhea and Nausea And Vomiting    (Demerol)  . Nabumetone Rash    (Relafen)  . Naproxen Itching and Rash  . Sulfa Antibiotics Rash  . Sulfonamide Derivatives Rash    Family History  Problem Relation Age of Onset  . Heart disease Other     BP 138/88 (BP Location: Right Arm, Patient Position: Sitting, Cuff Size: Normal)   Pulse 62   Temp 97.7 F (36.5 C) (Oral)   Ht 5\' 3"  (1.6 m)   Wt 135 lb (61.2 kg)   SpO2 97%   BMI 23.91 kg/m    Review of Systems Denies fever    Objective:   Physical Exam VITAL SIGNS:  See vs page GENERAL: no distress NECK: multiple thyroid nodules are noted (R>L).        Assessment & Plan:  Multinodular goiter: clinically stable Hyperthyroidism, due to the goiter: recheck today  Patient Instructions  blood tests are being requested for you today.  We'll contact you with results.   If ever you have fever while taking methimazole, stop it and call us, even if the reason is obvious, because of the risk of a rare side-effect. Please come back for a follow-up appointment in 6 months.

## 2018-04-27 ENCOUNTER — Ambulatory Visit: Payer: Medicare Other | Admitting: Orthotics

## 2018-04-27 ENCOUNTER — Ambulatory Visit (INDEPENDENT_AMBULATORY_CARE_PROVIDER_SITE_OTHER): Payer: Medicare Other

## 2018-04-27 ENCOUNTER — Ambulatory Visit: Payer: Medicare Other | Admitting: Podiatry

## 2018-04-27 DIAGNOSIS — M779 Enthesopathy, unspecified: Secondary | ICD-10-CM

## 2018-04-27 DIAGNOSIS — M7742 Metatarsalgia, left foot: Secondary | ICD-10-CM

## 2018-04-27 DIAGNOSIS — M7741 Metatarsalgia, right foot: Secondary | ICD-10-CM | POA: Diagnosis not present

## 2018-04-27 DIAGNOSIS — M7750 Other enthesopathy of unspecified foot: Secondary | ICD-10-CM

## 2018-04-27 DIAGNOSIS — M7752 Other enthesopathy of left foot: Secondary | ICD-10-CM

## 2018-04-27 DIAGNOSIS — M7751 Other enthesopathy of right foot: Secondary | ICD-10-CM

## 2018-04-27 NOTE — Progress Notes (Signed)
Cast today per dr. Jacqualyn Posey for CMFO to address forefoot pain.  Plan on LW accomodative device w/ forefoot cushionin and b/l met pads.

## 2018-04-28 NOTE — Progress Notes (Signed)
Subjective:   Patient ID: Ariel SpannerElizabeth A Sorlie, female   DOB: 82 y.o.   MRN: [id number]   HPI 82 year old female presents the office today requesting new orthotics.  She states the orthotics are several years old and she states that they are worn out she is a new pair.  She states that she is knee pain into the toes on the ball of her foot at times and is been a chronic issue going on for more than 1 year.  She denies any recent injury or trauma she denies any increase in swelling or redness of the area she denies any other treatment.  She said to get new orthotics today.  She has no other concerns.   Review of Systems  All other systems reviewed and are negative.  Past Medical History:  Diagnosis Date  . ASTHMA 05/03/2009  . CVA (cerebral infarction)   . Dyslipidemia   . HYPERTENSION 04/30/2007  . HYPERTHYROIDISM 06/17/2007  . Menopause   . Multinodular goiter   . OSTEOPOROSIS 04/30/2007    Past Surgical History:  Procedure Laterality Date  . BACK SURGERY    . BREAST EXCISIONAL BIOPSY Left   . carotid artery surgery    . NECK SURGERY    . OTHER SURGICAL HISTORY     discectomy     Current Outpatient Medications:  .  albuterol (PROVENTIL HFA;VENTOLIN HFA) 108 (90 BASE) MCG/ACT inhaler, Inhale 2 puffs into the lungs every 6 (six) hours as needed. (Patient taking differently: Inhale 2 puffs into the lungs every 6 (six) hours as needed for wheezing or shortness of breath. ), Disp: 1 Inhaler, Rfl: 6 .  ALPRAZolam (XANAX) 0.25 MG tablet, Take 0.25 mg by mouth at bedtime as needed for anxiety., Disp: , Rfl:  .  b complex vitamins tablet, Take 1 tablet by mouth daily.  , Disp: , Rfl:  .  Calcium Carbonate-Vitamin D (CALCIUM 500 + D PO), Take 1 tablet by mouth 2 (two) times daily. , Disp: , Rfl:  .  ezetimibe-simvastatin (VYTORIN) 10-20 MG per tablet, Take 1 tablet by mouth daily after breakfast. , Disp: , Rfl:  .  ibuprofen (ADVIL,MOTRIN) 200 MG tablet, Take 600 mg by mouth every 6  (six) hours as needed (for pain or headaches). , Disp: , Rfl:  .  levalbuterol (XOPENEX) 0.63 MG/3ML nebulizer solution, Take 3 mLs (0.63 mg total) by nebulization every 4 (four) hours. (Patient taking differently: Take 0.63 mg by nebulization every 6 (six) hours as needed for wheezing or shortness of breath. ), Disp: 3 mL, Rfl: 12 .  methimazole (TAPAZOLE) 10 MG tablet, TAKE 1 TABLET BY MOUTH EVERY DAY, Disp: 90 tablet, Rfl: 1 .  Mirabegron (MYRBETRIQ PO), Take by mouth., Disp: , Rfl:  .  montelukast (SINGULAIR) 10 MG tablet, Take 10 mg by mouth at bedtime., Disp: , Rfl:  .  Multiple Vitamin (MULTIVITAMIN) tablet, Take 1 tablet by mouth daily.  , Disp: , Rfl:  .  SYMBICORT 160-4.5 MCG/ACT inhaler, Inhale 2 puffs into the lungs 2 (two) times daily., Disp: , Rfl:  .  telmisartan (MICARDIS) 40 MG tablet, Take 40 mg by mouth 2 (two) times daily. , Disp: , Rfl:  .  UNKNOWN TO PATIENT, Unnamed tablet for urinary issues and/or bladder control: Take 1 tablet bu mouth once a day, Disp: , Rfl:   Allergies  Allergen Reactions  . Cetirizine & Related Other (See Comments)    Stomach pains  . Codeine Itching    Severe itching  .  Meperidine Diarrhea and Nausea And Vomiting  . Meperidine Hcl Diarrhea and Nausea And Vomiting    (Demerol)  . Nabumetone Rash    (Relafen)  . Naproxen Itching and Rash  . Sulfa Antibiotics Rash  . Sulfonamide Derivatives Rash         Objective:  Physical Exam  General: AAO x3, NAD  Dermatological: Skin is warm, dry and supple bilateral. Nails x 10 are well manicured; remaining integument appears unremarkable at this time. There are no open sores, no preulcerative lesions, no rash or signs of infection present.  Vascular: Dorsalis Pedis artery and Posterior Tibial artery pedal pulses are 2/4 bilateral with immedate capillary fill time.  There is no pain with calf compression, swelling, warmth, erythema.   Neruologic: Grossly intact via light touch bilateral.  Protective threshold with Semmes Wienstein monofilament intact to all pedal sites bilateral.   Musculoskeletal: There is no erythema no tenderness or pain to vibratory sensation was subjectively there is discomfort with toes and there is mild diffuse tenderness to the toes.  Mild tenderness to palpation submetatarsal area 2 through 4 bilaterally there is no specific area pinpoint tenderness.  There is no edema, erythema.  No numbness or tingling.  No palpable neuroma.  Muscular strength 5/5 in all groups tested bilateral.  Gait: Unassisted, Nonantalgic.       Assessment:   82 year old female presents with bilateral foot pain requesting new orthotics    Plan:  -Treatment options discussed including all alternatives, risks, and complications -Etiology of symptoms were discussed -X-rays were obtained and reviewed with the patient.  There is no evidence of acute fracture or stress fracture identified today. -She presents today requesting new orthotics.  We will add a metatarsal pad for orthotics.  Raiford NobleRick evaluated her today and she has multiple orthotics.  We discussed shoe modifications general stretching exercises.    Vivi BarrackMatthew R Jemimah Cressy DPM

## 2018-05-17 ENCOUNTER — Other Ambulatory Visit: Payer: Self-pay | Admitting: Endocrinology

## 2018-05-18 ENCOUNTER — Ambulatory Visit: Payer: Medicare Other | Admitting: Orthotics

## 2018-05-18 DIAGNOSIS — M779 Enthesopathy, unspecified: Secondary | ICD-10-CM

## 2018-05-18 NOTE — Progress Notes (Signed)
Patient came in today to pick up custom made foot orthotics.  The goals were accomplished and the patient reported no dissatisfaction with said orthotics.  Patient was advised of breakin period and how to report any issues. 

## 2018-06-09 ENCOUNTER — Ambulatory Visit: Payer: Medicare Other | Admitting: Endocrinology

## 2018-10-18 ENCOUNTER — Other Ambulatory Visit: Payer: Self-pay | Admitting: Endocrinology

## 2018-10-18 ENCOUNTER — Encounter: Payer: Self-pay | Admitting: Endocrinology

## 2018-10-18 ENCOUNTER — Ambulatory Visit: Payer: Medicare Other | Admitting: Endocrinology

## 2018-10-18 VITALS — BP 158/88 | HR 70 | Ht 63.0 in | Wt 137.6 lb

## 2018-10-18 DIAGNOSIS — E0520 Thyrotoxicosis with toxic multinodular goiter without thyrotoxic crisis or storm: Secondary | ICD-10-CM | POA: Diagnosis not present

## 2018-10-18 LAB — TSH: TSH: 7.18 u[IU]/mL — ABNORMAL HIGH (ref 0.35–4.50)

## 2018-10-18 LAB — T4, FREE: Free T4: 0.99 ng/dL (ref 0.60–1.60)

## 2018-10-18 MED ORDER — METHIMAZOLE 5 MG PO TABS: 7.5000 mg | ORAL_TABLET | Freq: Every day | ORAL | 5 refills | Status: DC

## 2018-10-18 NOTE — Progress Notes (Signed)
Subjective:    Patient ID: Ariel Lambert, female    DOB: 03-17-1936, 83 y.o.   MRN: [id number]  HPI Pt returns for f/u of hyperthyroidism due to small multinodular goiter (dx'ed approx 1999; she chose chronic tapazole rx; most recent US was in 2015--no change; it has been well-controlled with tapazole).  pt states she feels well in general.   Past Medical History:  Diagnosis Date  . ASTHMA 05/03/2009  . CVA (cerebral infarction)   . Dyslipidemia   . HYPERTENSION 04/30/2007  . HYPERTHYROIDISM 06/17/2007  . Menopause   . Multinodular goiter   . OSTEOPOROSIS 04/30/2007    Past Surgical History:  Procedure Laterality Date  . BACK SURGERY    . BREAST EXCISIONAL BIOPSY Left   . carotid artery surgery    . NECK SURGERY    . OTHER SURGICAL HISTORY     discectomy    Social History   Socioeconomic History  . Marital status: Widowed    Spouse name: Not on file  . Number of children: Not on file  . Years of education: Not on file  . Highest education level: Not on file  Occupational History  . Occupation: Magazine features editor: RETIRED  Social Needs  . Financial resource strain: Not on file  . Food insecurity:    Worry: Not on file    Inability: Not on file  . Transportation needs:    Medical: Not on file    Non-medical: Not on file  Tobacco Use  . Smoking status: Former Games developer  . Smokeless tobacco: Never Used  Substance and Sexual Activity  . Alcohol use: Yes    Alcohol/week: 0.0 standard drinks    Comment: Socially  . Drug use: No  . Sexual activity: Not on file  Lifestyle  . Physical activity:    Days per week: Not on file    Minutes per session: Not on file  . Stress: Not on file  Relationships  . Social connections:    Talks on phone: Not on file    Gets together: Not on file    Attends religious service: Not on file    Active member of club or organization: Not on file    Attends meetings of clubs or organizations: Not on file    Relationship  status: Not on file  . Intimate partner violence:    Fear of current or ex partner: Not on file    Emotionally abused: Not on file    Physically abused: Not on file    Forced sexual activity: Not on file  Other Topics Concern  . Not on file  Social History Narrative   Lives alone.    Current Outpatient Medications on File Prior to Visit  Medication Sig Dispense Refill  . albuterol (PROVENTIL HFA;VENTOLIN HFA) 108 (90 BASE) MCG/ACT inhaler Inhale 2 puffs into the lungs every 6 (six) hours as needed. (Patient taking differently: Inhale 2 puffs into the lungs every 6 (six) hours as needed for wheezing or shortness of breath. ) 1 Inhaler 6  . ALPRAZolam (XANAX) 0.25 MG tablet Take 0.25 mg by mouth at bedtime as needed for anxiety.    Marland Kitchen b complex vitamins tablet Take 1 tablet by mouth daily.      . Calcium Carbonate-Vitamin D (CALCIUM 500 + D PO) Take 1 tablet by mouth 2 (two) times daily.     Marland Kitchen ezetimibe-simvastatin (VYTORIN) 10-20 MG per tablet Take 1 tablet by mouth daily after breakfast.     .  ibuprofen (ADVIL,MOTRIN) 200 MG tablet Take 600 mg by mouth every 6 (six) hours as needed (for pain or headaches).     Marland Kitchen. levalbuterol (XOPENEX) 0.63 MG/3ML nebulizer solution Take 3 mLs (0.63 mg total) by nebulization every 4 (four) hours. (Patient taking differently: Take 0.63 mg by nebulization every 6 (six) hours as needed for wheezing or shortness of breath. ) 3 mL 12  . methimazole (TAPAZOLE) 10 MG tablet TAKE 1 TABLET BY MOUTH EVERY DAY 90 tablet 1  . Mirabegron (MYRBETRIQ PO) Take by mouth.    . montelukast (SINGULAIR) 10 MG tablet Take 10 mg by mouth at bedtime.    . Multiple Vitamin (MULTIVITAMIN) tablet Take 1 tablet by mouth daily.      . SYMBICORT 160-4.5 MCG/ACT inhaler Inhale 2 puffs into the lungs 2 (two) times daily.    Marland Kitchen. telmisartan (MICARDIS) 40 MG tablet Take 40 mg by mouth 2 (two) times daily.     Marland Kitchen. UNKNOWN TO PATIENT Unnamed tablet for urinary issues and/or bladder control: Take 1  tablet bu mouth once a day     No current facility-administered medications on file prior to visit.     Allergies  Allergen Reactions  . Cetirizine & Related Other (See Comments)    Stomach pains  . Codeine Itching    Severe itching  . Meperidine Diarrhea and Nausea And Vomiting  . Meperidine Hcl Diarrhea and Nausea And Vomiting    (Demerol)  . Nabumetone Rash    (Relafen)  . Naproxen Itching and Rash  . Sulfa Antibiotics Rash  . Sulfonamide Derivatives Rash    Family History  Problem Relation Age of Onset  . Heart disease Other     BP (!) 158/88 (BP Location: Right Arm, Patient Position: Sitting, Cuff Size: Normal) Comment: has not taken antihypertensive this morning  Pulse 70   Ht 5\' 3"  (1.6 m)   Wt 137 lb 9.6 oz (62.4 kg)   SpO2 95%   BMI 24.37 kg/m    Review of Systems Denies fever.      Objective:   Physical Exam VITAL SIGNS:  See vs page.   GENERAL: no distress.   NECK: multiple thyroid nodules are noted (R>L).        Assessment & Plan:  Hyperthyroidism: recheck today Goiter: clinically stable HTN: is noted today.   Patient Instructions  Your blood pressure is high today.  Please see your primary care provider soon, to have it rechecked.   Blood tests are requested for you today.  We'll let you know about the results.  Please come back for a follow-up appointment in 6 months.

## 2018-10-18 NOTE — Patient Instructions (Signed)
Your blood pressure is high today.  Please see your primary care provider soon, to have it rechecked Blood tests are requested for you today.  We'll let you know about the results.   Please come back for a follow-up appointment in 6 months.   

## 2018-10-18 NOTE — Addendum Note (Signed)
Addended by: Nehemiah Massed D on: 10/18/2018 09:37 AM   Modules accepted: Orders

## 2018-10-19 ENCOUNTER — Telehealth: Payer: Self-pay | Admitting: Endocrinology

## 2018-10-19 NOTE — Telephone Encounter (Signed)
error 

## 2018-10-28 ENCOUNTER — Other Ambulatory Visit: Payer: Self-pay | Admitting: Family Medicine

## 2018-10-28 DIAGNOSIS — Z1231 Encounter for screening mammogram for malignant neoplasm of breast: Secondary | ICD-10-CM

## 2018-11-08 ENCOUNTER — Other Ambulatory Visit: Payer: Self-pay | Admitting: Endocrinology

## 2018-12-13 ENCOUNTER — Ambulatory Visit: Payer: Medicare Other

## 2018-12-28 ENCOUNTER — Ambulatory Visit: Payer: Medicare Other | Admitting: Endocrinology

## 2019-01-31 ENCOUNTER — Ambulatory Visit: Payer: Medicare Other

## 2019-02-21 ENCOUNTER — Other Ambulatory Visit: Payer: Self-pay

## 2019-02-23 ENCOUNTER — Ambulatory Visit: Payer: Medicare Other | Admitting: Endocrinology

## 2019-02-23 ENCOUNTER — Encounter: Payer: Self-pay | Admitting: Endocrinology

## 2019-02-23 ENCOUNTER — Other Ambulatory Visit: Payer: Self-pay

## 2019-02-23 VITALS — BP 112/80 | HR 69 | Temp 97.7°F | Wt 129.2 lb

## 2019-02-23 DIAGNOSIS — E0520 Thyrotoxicosis with toxic multinodular goiter without thyrotoxic crisis or storm: Secondary | ICD-10-CM | POA: Diagnosis not present

## 2019-02-23 LAB — T4, FREE: Free T4: 1.08 ng/dL (ref 0.60–1.60)

## 2019-02-23 LAB — TSH: TSH: 1.61 u[IU]/mL (ref 0.35–4.50)

## 2019-02-23 NOTE — Patient Instructions (Addendum)
Blood tests are requested for you today.  We'll let you know about the results.    If ever you have fever while taking methimazole, stop it and call us, even if the reason is obvious, because of the risk of a rare side-effect.   Please come back for a follow-up appointment in 4 months.   

## 2019-02-23 NOTE — Progress Notes (Signed)
Subjective:    Patient ID: Ariel Lambert, female    DOB: [birth year], 83 y.o.   MRN: [id number]  HPI Pt returns for f/u of hyperthyroidism due to small multinodular goiter (dx'ed approx 1999; she chose chronic tapazole rx; most recent US was in 2015--no change; it has been well-controlled with tapazole).  pt states she feels well in general.  pt verifies tapazole dosage as 7.5 mg qd.  pt states she feels well in general.  Past Medical History:  Diagnosis Date  . ASTHMA 05/03/2009  . CVA (cerebral infarction)   . Dyslipidemia   . HYPERTENSION 04/30/2007  . HYPERTHYROIDISM 06/17/2007  . Menopause   . Multinodular goiter   . OSTEOPOROSIS 04/30/2007    Past Surgical History:  Procedure Laterality Date  . BACK SURGERY    . BREAST EXCISIONAL BIOPSY Left   . carotid artery surgery    . NECK SURGERY    . OTHER SURGICAL HISTORY     discectomy    Social History   Socioeconomic History  . Marital status: Widowed    Spouse name: Not on file  . Number of children: Not on file  . Years of education: Not on file  . Highest education level: Not on file  Occupational History  . Occupation: Magazine features editorTeacher    Employer: RETIRED  Social Needs  . Financial resource strain: Not on file  . Food insecurity:    Worry: Not on file    Inability: Not on file  . Transportation needs:    Medical: Not on file    Non-medical: Not on file  Tobacco Use  . Smoking status: Former Games developermoker  . Smokeless tobacco: Never Used  Substance and Sexual Activity  . Alcohol use: Yes    Alcohol/week: 0.0 standard drinks    Comment: Socially  . Drug use: No  . Sexual activity: Not on file  Lifestyle  . Physical activity:    Days per week: Not on file    Minutes per session: Not on file  . Stress: Not on file  Relationships  . Social connections:    Talks on phone: Not on file    Gets together: Not on file    Attends religious service: Not on file    Active member of club or organization: Not on file   Attends meetings of clubs or organizations: Not on file    Relationship status: Not on file  . Intimate partner violence:    Fear of current or ex partner: Not on file    Emotionally abused: Not on file    Physically abused: Not on file    Forced sexual activity: Not on file  Other Topics Concern  . Not on file  Social History Narrative   Lives alone.    Current Outpatient Medications on File Prior to Visit  Medication Sig Dispense Refill  . albuterol (PROVENTIL HFA;VENTOLIN HFA) 108 (90 BASE) MCG/ACT inhaler Inhale 2 puffs into the lungs every 6 (six) hours as needed. (Patient taking differently: Inhale 2 puffs into the lungs every 6 (six) hours as needed for wheezing or shortness of breath. ) 1 Inhaler 6  . ALPRAZolam (XANAX) 0.25 MG tablet Take 0.25 mg by mouth at bedtime as needed for anxiety.    Marland Kitchen. b complex vitamins tablet Take 1 tablet by mouth daily.      . Calcium Carbonate-Vitamin D (CALCIUM 500 + D PO) Take 1 tablet by mouth 2 (two) times daily.     Marland Kitchen. ezetimibe-simvastatin (  VYTORIN) 10-20 MG per tablet Take 1 tablet by mouth daily after breakfast.     . ibuprofen (ADVIL,MOTRIN) 200 MG tablet Take 600 mg by mouth every 6 (six) hours as needed (for pain or headaches).     Marland Kitchen levalbuterol (XOPENEX) 0.63 MG/3ML nebulizer solution Take 3 mLs (0.63 mg total) by nebulization every 4 (four) hours. (Patient taking differently: Take 0.63 mg by nebulization every 6 (six) hours as needed for wheezing or shortness of breath. ) 3 mL 12  . methimazole (TAPAZOLE) 5 MG tablet Take 1.5 tablets (7.5 mg total) by mouth daily. 45 tablet 5  . Mirabegron (MYRBETRIQ PO) Take by mouth.    . montelukast (SINGULAIR) 10 MG tablet Take 10 mg by mouth at bedtime.    . Multiple Vitamin (MULTIVITAMIN) tablet Take 1 tablet by mouth daily.      . SYMBICORT 160-4.5 MCG/ACT inhaler Inhale 2 puffs into the lungs 2 (two) times daily.    Marland Kitchen telmisartan (MICARDIS) 40 MG tablet Take 40 mg by mouth 2 (two) times daily.      Marland Kitchen UNKNOWN TO PATIENT Unnamed tablet for urinary issues and/or bladder control: Take 1 tablet bu mouth once a day     No current facility-administered medications on file prior to visit.     Allergies  Allergen Reactions  . Cetirizine & Related Other (See Comments)    Stomach pains  . Codeine Itching    Severe itching  . Meperidine Diarrhea and Nausea And Vomiting  . Meperidine Hcl Diarrhea and Nausea And Vomiting    (Demerol)  . Nabumetone Rash    (Relafen)  . Naproxen Itching and Rash  . Sulfa Antibiotics Rash  . Sulfonamide Derivatives Rash    Family History  Problem Relation Age of Onset  . Heart disease Other     BP 112/80 (BP Location: Right Arm, Patient Position: Sitting, Cuff Size: Normal)   Pulse 69   Temp 97.7 F (36.5 C) (Oral)   Wt 129 lb 3.2 oz (58.6 kg)   SpO2 96%   BMI 22.89 kg/m   Review of Systems Denies fever.      Objective:   Physical Exam VITAL SIGNS:  See vs page GENERAL: no distress NECK: thyroid is twice normal size, with multinodular surface    Lab Results  Component Value Date   TSH 1.61 02/23/2019      Assessment & Plan:  Hyperthyroidism: well-controlled.  Please continue the same medication Anxiety: in this setting, she needs to maintain euthuyroidism

## 2019-03-17 ENCOUNTER — Other Ambulatory Visit: Payer: Self-pay

## 2019-03-17 ENCOUNTER — Ambulatory Visit
Admission: RE | Admit: 2019-03-17 | Discharge: 2019-03-17 | Disposition: A | Discharge status: Home / Self Care | Payer: Medicare Other | Source: Ambulatory Visit | Attending: Family Medicine | Admitting: Family Medicine

## 2019-03-17 DIAGNOSIS — Z1231 Encounter for screening mammogram for malignant neoplasm of breast: Secondary | ICD-10-CM

## 2019-05-02 ENCOUNTER — Ambulatory Visit: Payer: Medicare Other | Admitting: Endocrinology

## 2019-06-22 ENCOUNTER — Encounter: Payer: Self-pay | Admitting: Endocrinology

## 2019-06-22 ENCOUNTER — Ambulatory Visit: Payer: Medicare Other | Admitting: Endocrinology

## 2019-06-22 ENCOUNTER — Other Ambulatory Visit: Payer: Self-pay

## 2019-06-22 VITALS — BP 120/72 | HR 71 | Ht 63.0 in | Wt 133.0 lb

## 2019-06-22 DIAGNOSIS — R221 Localized swelling, mass and lump, neck: Secondary | ICD-10-CM | POA: Insufficient documentation

## 2019-06-22 DIAGNOSIS — E0520 Thyrotoxicosis with toxic multinodular goiter without thyrotoxic crisis or storm: Secondary | ICD-10-CM

## 2019-06-22 LAB — T4, FREE: Free T4: 1.32 ng/dL (ref 0.60–1.60)

## 2019-06-22 LAB — TSH: TSH: 3.45 u[IU]/mL (ref 0.35–4.50)

## 2019-06-22 NOTE — Progress Notes (Signed)
Subjective:    Patient ID: Ariel Lambert, female    DOB: 09-Nov-1935, 83 y.o.   MRN: [id number]  HPI Pt returns for f/u of hyperthyroidism due to small multinodular goiter (dx'ed approx 1999; she chose chronic tapazole rx; most recent US was in 2015--no change; it has been well-controlled with tapazole). She alternates 1 pill and 1/2 pill, each day.  pt states she feels well in general.  Denies neck swelling or pain.   Past Medical History:  Diagnosis Date  . ASTHMA 05/03/2009  . CVA (cerebral infarction)   . Dyslipidemia   . HYPERTENSION 04/30/2007  . HYPERTHYROIDISM 06/17/2007  . Menopause   . Multinodular goiter   . OSTEOPOROSIS 04/30/2007    Past Surgical History:  Procedure Laterality Date  . BACK SURGERY    . BREAST EXCISIONAL BIOPSY Left   . carotid artery surgery    . NECK SURGERY    . OTHER SURGICAL HISTORY     discectomy    Social History   Socioeconomic History  . Marital status: Widowed    Spouse name: Not on file  . Number of children: Not on file  . Years of education: Not on file  . Highest education level: Not on file  Occupational History  . Occupation: Magazine features editor: RETIRED  Social Needs  . Financial resource strain: Not on file  . Food insecurity    Worry: Not on file    Inability: Not on file  . Transportation needs    Medical: Not on file    Non-medical: Not on file  Tobacco Use  . Smoking status: Former Games developer  . Smokeless tobacco: Never Used  Substance and Sexual Activity  . Alcohol use: Yes    Alcohol/week: 0.0 standard drinks    Comment: Socially  . Drug use: No  . Sexual activity: Not on file  Lifestyle  . Physical activity    Days per week: Not on file    Minutes per session: Not on file  . Stress: Not on file  Relationships  . Social Musician on phone: Not on file    Gets together: Not on file    Attends religious service: Not on file    Active member of club or organization: Not on file   Attends meetings of clubs or organizations: Not on file    Relationship status: Not on file  . Intimate partner violence    Fear of current or ex partner: Not on file    Emotionally abused: Not on file    Physically abused: Not on file    Forced sexual activity: Not on file  Other Topics Concern  . Not on file  Social History Narrative   Lives alone.    Current Outpatient Medications on File Prior to Visit  Medication Sig Dispense Refill  . albuterol (PROVENTIL HFA;VENTOLIN HFA) 108 (90 BASE) MCG/ACT inhaler Inhale 2 puffs into the lungs every 6 (six) hours as needed. (Patient taking differently: Inhale 2 puffs into the lungs every 6 (six) hours as needed for wheezing or shortness of breath. ) 1 Inhaler 6  . alendronate (FOSAMAX) 70 MG tablet Take 70 mg by mouth once a week. Take with a full glass of water on an empty stomach.    Marland Kitchen b complex vitamins tablet Take 1 tablet by mouth daily.      . Biotin 1 MG CAPS Take 1 tablet by mouth daily.    . Calcium Carbonate-Vitamin  D (CALCIUM 500 + D PO) Take 1 tablet by mouth 2 (two) times daily.     Marland Kitchen ezetimibe-simvastatin (VYTORIN) 10-20 MG per tablet Take 1 tablet by mouth daily after breakfast.     . ibuprofen (ADVIL,MOTRIN) 200 MG tablet Take 600 mg by mouth every 6 (six) hours as needed (for pain or headaches).     Marland Kitchen levalbuterol (XOPENEX) 0.63 MG/3ML nebulizer solution Take 3 mLs (0.63 mg total) by nebulization every 4 (four) hours. (Patient taking differently: Take 0.63 mg by nebulization every 6 (six) hours as needed for wheezing or shortness of breath. ) 3 mL 12  . mirabegron ER (MYRBETRIQ) 50 MG TB24 tablet Take 50 mg by mouth daily.    . montelukast (SINGULAIR) 10 MG tablet Take 10 mg by mouth at bedtime.    . Multiple Vitamin (MULTIVITAMIN) tablet Take 1 tablet by mouth daily.      . SYMBICORT 160-4.5 MCG/ACT inhaler Inhale 2 puffs into the lungs 2 (two) times daily.    Marland Kitchen telmisartan (MICARDIS) 40 MG tablet Take 40 mg by mouth 2 (two)  times daily.      No current facility-administered medications on file prior to visit.     Allergies  Allergen Reactions  . Cetirizine & Related Other (See Comments)    Stomach pains  . Codeine Itching    Severe itching  . Meperidine Diarrhea and Nausea And Vomiting  . Meperidine Hcl Diarrhea and Nausea And Vomiting    (Demerol)  . Nabumetone Rash    (Relafen)  . Naproxen Itching and Rash  . Sulfa Antibiotics Rash  . Sulfonamide Derivatives Rash    Family History  Problem Relation Age of Onset  . Heart disease Other   . Breast cancer Mother 60    BP 120/72 (BP Location: Left Arm, Patient Position: Sitting, Cuff Size: Normal)   Pulse 71   Ht 5\' 3"  (1.6 m)   Wt 133 lb (60.3 kg)   SpO2 95%   BMI 23.56 kg/m    Review of Systems Denies fever    Objective:   Physical Exam VITAL SIGNS:  See vs page GENERAL: no distress NECK: There is no palpable thyroid enlargement.  There is a 3 cm right lat neck nodule.   Lab Results  Component Value Date   TSH 3.45 06/22/2019      Assessment & Plan:  Neck nodule, new.   Hyperthyroidism: well-controlled  Patient Instructions  Let's recheck the ultrasound.  you will receive a phone call, about a day and time for an appointment. Blood tests are requested for you today.  We'll let you know about the results.  If ever you have fever while taking methimazole, stop it and call us, even if the reason is obvious, because of the risk of a rare side-effect.   Please come back for a follow-up appointment in 6 months.

## 2019-06-22 NOTE — Patient Instructions (Signed)
Let's recheck the ultrasound.  you will receive a phone call, about a day and time for an appointment. Blood tests are requested for you today.  We'll let you know about the results.  If ever you have fever while taking methimazole, stop it and call us, even if the reason is obvious, because of the risk of a rare side-effect.   Please come back for a follow-up appointment in 6 months.

## 2019-06-25 MED ORDER — METHIMAZOLE 5 MG PO TABS: ORAL_TABLET | ORAL | 3 refills | Status: DC

## 2019-07-01 ENCOUNTER — Other Ambulatory Visit: Payer: Self-pay | Admitting: Endocrinology

## 2019-07-01 ENCOUNTER — Telehealth: Payer: Self-pay

## 2019-07-01 ENCOUNTER — Ambulatory Visit
Admission: RE | Admit: 2019-07-01 | Discharge: 2019-07-01 | Disposition: A | Discharge status: Home / Self Care | Payer: Medicare Other | Source: Ambulatory Visit | Attending: Endocrinology | Admitting: Endocrinology

## 2019-07-01 DIAGNOSIS — R221 Localized swelling, mass and lump, neck: Secondary | ICD-10-CM

## 2019-07-01 DIAGNOSIS — M25562 Pain in left knee: Secondary | ICD-10-CM | POA: Insufficient documentation

## 2019-07-01 DIAGNOSIS — M25561 Pain in right knee: Secondary | ICD-10-CM | POA: Insufficient documentation

## 2019-07-01 NOTE — Telephone Encounter (Signed)
Patient called in wanting the results of her labs   Please advise

## 2019-07-01 NOTE — Telephone Encounter (Signed)
Notes recorded by Aleatha Borer, LPN on 58/04/5026 at 7:41 AM EDT  Letter mailed  ------   Notes recorded by Renato Shin, MD on 06/22/2019 at 4:42 PM EDT  please contact patient:  Normal. Please continue the same medication.  I'll see you next time.  Called pt and informed of above

## 2019-07-22 ENCOUNTER — Other Ambulatory Visit: Payer: Self-pay | Admitting: Otolaryngology

## 2019-07-22 DIAGNOSIS — R221 Localized swelling, mass and lump, neck: Secondary | ICD-10-CM

## 2019-07-27 ENCOUNTER — Ambulatory Visit
Admission: RE | Admit: 2019-07-27 | Discharge: 2019-07-27 | Disposition: A | Discharge status: Home / Self Care | Payer: Medicare Other | Source: Ambulatory Visit | Attending: Otolaryngology | Admitting: Otolaryngology

## 2019-07-27 ENCOUNTER — Other Ambulatory Visit: Payer: Self-pay

## 2019-07-27 DIAGNOSIS — R221 Localized swelling, mass and lump, neck: Secondary | ICD-10-CM

## 2019-07-27 MED ORDER — IOPAMIDOL (ISOVUE-300) INJECTION 61%
75.0000 mL | Freq: Once | INTRAVENOUS | Status: AC | PRN
  Administered 2019-07-27: 75 mL via INTRAVENOUS

## 2019-09-01 ENCOUNTER — Other Ambulatory Visit: Payer: Self-pay

## 2019-09-01 DIAGNOSIS — I6529 Occlusion and stenosis of unspecified carotid artery: Secondary | ICD-10-CM

## 2019-09-02 ENCOUNTER — Encounter: Payer: Self-pay | Admitting: Vascular Surgery

## 2019-09-02 ENCOUNTER — Other Ambulatory Visit: Payer: Self-pay

## 2019-09-02 ENCOUNTER — Telehealth: Payer: Self-pay | Admitting: Internal Medicine

## 2019-09-02 ENCOUNTER — Ambulatory Visit (HOSPITAL_COMMUNITY)
Admission: RE | Admit: 2019-09-02 | Discharge: 2019-09-02 | Disposition: A | Discharge status: Home / Self Care | Payer: Medicare Other | Source: Ambulatory Visit | Attending: Vascular Surgery | Admitting: Vascular Surgery

## 2019-09-02 ENCOUNTER — Ambulatory Visit (INDEPENDENT_AMBULATORY_CARE_PROVIDER_SITE_OTHER): Payer: Medicare Other | Admitting: Vascular Surgery

## 2019-09-02 VITALS — BP 163/108 | HR 66 | Temp 97.3°F | Resp 20 | Ht 63.0 in | Wt 134.9 lb

## 2019-09-02 DIAGNOSIS — I6529 Occlusion and stenosis of unspecified carotid artery: Secondary | ICD-10-CM | POA: Insufficient documentation

## 2019-09-02 DIAGNOSIS — I720 Aneurysm of carotid artery: Secondary | ICD-10-CM | POA: Diagnosis not present

## 2019-09-02 NOTE — Progress Notes (Signed)
Patient ID: Ariel Lambert, female   DOB: Sep 04, 1936, 83 y.o.   MRN: [id number]  Reason for Consult: New Patient (Initial Visit)   Referred by Mila Palmer, MD  Subjective:     HPI:  Ariel Lambert is a 83 y.o. female with a history of bilateral carotid endarterectomy in 2001.  At that time she had had stroke or mini stroke.  More recently she was found to have a right sided neck mass.  She has undergone ultrasound as well as CT scan.  She now presents for duplex of her right carotid artery where previously she had endarterectomy.  She states she herself cannot feel the mass.  She has not had any recent stroke or TIA or amaurosis.  Past Medical History:  Diagnosis Date  . Allergy   . ASTHMA 05/03/2009  . Asthma   . CVA (cerebral infarction)   . Dyslipidemia   . HYPERTENSION 04/30/2007  . HYPERTHYROIDISM 06/17/2007  . Menopause   . Multinodular goiter   . OSTEOPOROSIS 04/30/2007   Family History  Problem Relation Age of Onset  . Heart disease Other   . Breast cancer Mother 43   Past Surgical History:  Procedure Laterality Date  . BACK SURGERY    . BREAST EXCISIONAL BIOPSY Left   . carotid artery surgery    . NECK SURGERY    . OTHER SURGICAL HISTORY     discectomy    Short Social History:  Social History   Tobacco Use  . Smoking status: Former Games developer  . Smokeless tobacco: Never Used  Substance Use Topics  . Alcohol use: Yes    Alcohol/week: 0.0 standard drinks    Comment: Socially    Allergies  Allergen Reactions  . Cetirizine & Related Other (See Comments)    Stomach pains  . Codeine Itching    Severe itching  . Meperidine Diarrhea and Nausea And Vomiting  . Meperidine Hcl Diarrhea and Nausea And Vomiting    (Demerol)  . Nabumetone Rash    (Relafen)  . Naproxen Itching and Rash  . Sulfa Antibiotics Rash  . Sulfonamide Derivatives Rash    Current Outpatient Medications  Medication Sig Dispense Refill  . albuterol (PROVENTIL  HFA;VENTOLIN HFA) 108 (90 BASE) MCG/ACT inhaler Inhale 2 puffs into the lungs every 6 (six) hours as needed. (Patient taking differently: Inhale 2 puffs into the lungs every 6 (six) hours as needed for wheezing or shortness of breath. ) 1 Inhaler 6  . alendronate (FOSAMAX) 70 MG tablet Take 70 mg by mouth once a week. Take with a full glass of water on an empty stomach.    Marland Kitchen b complex vitamins tablet Take 1 tablet by mouth daily.      . Biotin 1 MG CAPS Take 1 tablet by mouth daily.    . Calcium Carbonate-Vitamin D (CALCIUM 500 + D PO) Take 1 tablet by mouth 2 (two) times daily.     Marland Kitchen ezetimibe-simvastatin (VYTORIN) 10-20 MG per tablet Take 1 tablet by mouth daily after breakfast.     . ibuprofen (ADVIL,MOTRIN) 200 MG tablet Take 600 mg by mouth every 6 (six) hours as needed (for pain or headaches).     Marland Kitchen ipratropium (ATROVENT) 0.03 % nasal spray Place 2 sprays into both nostrils 2 (two) times daily as needed for rhinitis.    Marland Kitchen levalbuterol (XOPENEX) 0.63 MG/3ML nebulizer solution Take 3 mLs (0.63 mg total) by nebulization every 4 (four) hours. (Patient taking differently: Take 0.63 mg by  nebulization every 6 (six) hours as needed for wheezing or shortness of breath. ) 3 mL 12  . levocetirizine (XYZAL) 5 MG tablet Take 5 mg by mouth at bedtime.    . methimazole (TAPAZOLE) 5 MG tablet Take qd (alternate 1/2 and 1 pill) 45 tablet 3  . mirabegron ER (MYRBETRIQ) 50 MG TB24 tablet Take 50 mg by mouth daily.    . montelukast (SINGULAIR) 10 MG tablet Take 10 mg by mouth at bedtime.    . Multiple Vitamin (MULTIVITAMIN) tablet Take 1 tablet by mouth daily.      . SYMBICORT 160-4.5 MCG/ACT inhaler Inhale 2 puffs into the lungs 2 (two) times daily.    Marland Kitchen telmisartan (MICARDIS) 40 MG tablet Take 40 mg by mouth 2 (two) times daily.      No current facility-administered medications for this visit.    Review of Systems  Constitutional:  Constitutional negative. HENT: HENT negative.  Eyes: Eyes negative.    Respiratory: Respiratory negative.  Cardiovascular: Cardiovascular negative.  GI: Gastrointestinal negative.  Musculoskeletal: Musculoskeletal negative.  Skin: Skin negative.  Neurological: Neurological negative. Hematologic: Hematologic/lymphatic negative.  Psychiatric: Psychiatric negative.        Objective:  Objective   Vitals:   09/02/19 0858 09/02/19 0901  BP: (!) 161/90 (!) 163/108  Pulse: 66   Resp: 20   Temp: (!) 97.3 F (36.3 C)   SpO2: 99%   Weight: 134 lb 14.4 oz (61.2 kg)   Height: 5\' 3"  (1.6 m)    Body mass index is 23.9 kg/m.  Physical Exam HENT:     Head: Normocephalic.     Mouth/Throat:     Mouth: Mucous membranes are moist.  Eyes:     Pupils: Pupils are equal, round, and reactive to light.  Neck:     Vascular: No carotid bruit.     Comments: Lump in her right neck has minimal pulsatility Cardiovascular:     Rate and Rhythm: Normal rate.  Pulmonary:     Effort: Pulmonary effort is normal.  Abdominal:     General: Abdomen is flat.     Palpations: Abdomen is soft. There is no mass.  Musculoskeletal:        General: No swelling.     Cervical back: No tenderness.  Skin:    General: Skin is warm and dry.     Capillary Refill: Capillary refill takes less than 2 seconds.  Neurological:     General: No focal deficit present.     Mental Status: She is alert.  Psychiatric:        Mood and Affect: Mood normal.        Behavior: Behavior normal.        Thought Content: Thought content normal.        Judgment: Judgment normal.     Data: I have independently interpreted her bilateral carotid duplex.  She demonstrates 1 to 39% stenosis bilaterally.  Bilateral vertebral artery flow is antegrade.   ct IMPRESSION: 1. Advanced atherosclerotic disease. Postop carotid endarterectomy bilaterally 2. 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 Possible small amount of flow within the aneurysm versus  calcified thrombus. 3. Atherosclerotic disease in the carotid bifurcation bilaterally. Estimated 50% diameter stenosis right internal carotid artery. Arterial stenosis not accurately measured on this study of the neck soft tissues. 4. Multinodular goiter. See prior ultrasound report for recommendations. 5. These results were called by telephone at the time of interpretation on  07/27/2019 at 1:24 pm [facility] , who verbally acknowledged these results.      Assessment/Plan:     83 year old female presents with what appears to be pseudoaneurysm projecting off the inferior aspect of the previous patch angioplasty of carotid endarterectomy performed in 2001.  I reviewed her previous imaging including ultrasound in 2015 there did not appear to be any issue at that time nor with MRI in 2013.  Unsure of amount of time given the patient is asymptomatic.  Given the appearance of patch disruption I have recommended repair.  We will proceed with repeat carotid endarterectomy on the right in the near future.  I discussed the risk and benefits and alternatives and she demonstrates good understanding.  We will get cardiac clearance first.     Maeola HarmanBrandon [name] Drae Mitzel MD [facility]

## 2019-09-02 NOTE — Telephone Encounter (Signed)
   [facility] Pre-operative Risk Assessment    Request for surgical clearance:  1. What type of surgery is being performed? CEA  2. When is this surgery scheduled? 09/29/19  3. What type of clearance is required (medical clearance vs. Pharmacy clearance to hold med vs. Both)? medical  4. Are there any medications that need to be held prior to surgery and how long? Cardiologist recommendation  5. Practice name and name of physician performing surgery? [facility]  6. What is your office phone number: [phone]   7.   What is your office fax number: [phone]  8.   Anesthesia type (None, local, MAC, general) ? Cardiologist recommendation   Patient has appointment 09/21/19 with Dr.Acharya   Annabell Sabal 09/02/2019, 10:14 AM  _________________________________________________________________   (provider comments below)

## 2019-09-02 NOTE — Telephone Encounter (Signed)
   Primary Cardiologist:Gayatri Stann Mainland, MD  Chart reviewed as part of pre-operative protocol coverage. Miss GWENLYN HOTTINGER is a new patient and has an office visit set up 09/21/2019 with Dr. Margaretann Loveless.   I will route this note the the surgeon's office via the [place] Fax function to make them aware.   Loel Dubonnet, NP  09/02/2019, 3:17 PM

## 2019-09-14 ENCOUNTER — Other Ambulatory Visit: Payer: Medicare Other

## 2019-09-21 ENCOUNTER — Encounter: Payer: Self-pay | Admitting: Internal Medicine

## 2019-09-21 ENCOUNTER — Ambulatory Visit: Payer: Medicare PPO | Admitting: Internal Medicine

## 2019-09-21 ENCOUNTER — Other Ambulatory Visit: Payer: Self-pay

## 2019-09-21 VITALS — BP 143/91 | HR 67 | Temp 97.2°F | Ht 63.0 in | Wt 134.8 lb

## 2019-09-21 DIAGNOSIS — Z01818 Encounter for other preprocedural examination: Secondary | ICD-10-CM | POA: Diagnosis not present

## 2019-09-21 DIAGNOSIS — I10 Essential (primary) hypertension: Secondary | ICD-10-CM | POA: Diagnosis not present

## 2019-09-21 NOTE — Pre-Procedure Instructions (Signed)
Your procedure is scheduled on Thursday, January 14th, from 07:30 AM to 09:37 AM.  Report to [facility] Main Entrance "A" at 05:30 A.M., and check in at the Admitting office.  Call this number if you have problems the morning of surgery:  [phone]  Call [phone] if you have any questions prior to your surgery date Monday-Friday 8am-4pm    Remember:  Do not eat or drink after midnight the night before your surgery.    Take these medicines the morning of surgery with A SIP OF WATER : alendronate (FOSAMAX)  ezetimibe-simvastatin (VYTORIN) fluticasone (FLONASE)  methimazole (TAPAZOLE) mirabegron ER (MYRBETRIQ)  IF NEEDED: albuterol (PROVENTIL HFA;VENTOLIN HFA) inhaler, ipratropium (ATROVENT) nasal spray, levalbuterol (XOPENEX) nebulizer solution  Please bring all inhalers with you the day of surgery.    7 days prior to surgery STOP taking any Aspirin (unless otherwise instructed by your surgeon), aspirin-acetaminophen-caffeine (EXCEDRIN MIGRAINE), Aleve, Naproxen, Ibuprofen, Motrin, Advil, Goody's, BC's, all herbal medications, fish oil, and all vitamins.    The Morning of Surgery  Do not wear jewelry, make-up or nail polish.  Do not wear lotions, powders, perfumes, or deodorant  Do not shave 48 hours prior to surgery.    Do not bring valuables to the hospital.  [facility] is not responsible for any belongings or valuables.  If you are a smoker, DO NOT Smoke 24 hours prior to surgery  If you wear a CPAP at night please bring your mask, tubing, and machine the morning of surgery   Remember that you must have someone to transport you home after your surgery, and remain with you for 24 hours if you are discharged the same day.   Please bring cases for contacts, glasses, hearing aids, dentures or bridgework because it cannot be worn into surgery.    Leave your suitcase in the car.  After surgery it may be brought to your room.  For patients admitted to the hospital,  discharge time will be determined by your treatment team.  Patients discharged the day of surgery will not be allowed to drive home.    Special instructions:   [place]- Preparing For Surgery  Before surgery, you can play an important role. Because skin is not sterile, your skin needs to be as free of germs as possible. You can reduce the number of germs on your skin by washing with CHG (chlorahexidine gluconate) Soap before surgery.  CHG is an antiseptic cleaner which kills germs and bonds with the skin to continue killing germs even after washing.    Oral Hygiene is also important to reduce your risk of infection.  Remember - BRUSH YOUR TEETH THE MORNING OF SURGERY WITH YOUR REGULAR TOOTHPASTE  Please do not use if you have an allergy to CHG or antibacterial soaps. If your skin becomes reddened/irritated stop using the CHG.  Do not shave (including legs and underarms) for at least 48 hours prior to first CHG shower. It is OK to shave your face.  Please follow these instructions carefully.   1. Shower the NIGHT BEFORE SURGERY and the MORNING OF SURGERY with CHG Soap.   2. If you chose to wash your hair, wash your hair first as usual with your normal shampoo.  3. After you shampoo, rinse your hair and body thoroughly to remove the shampoo.  4. Use CHG as you would any other liquid soap. You can apply CHG directly to the skin and wash gently with a scrungie or a clean washcloth.  5. Apply the CHG Soap to your body ONLY FROM THE NECK DOWN.  Do not use on open wounds or open sores. Avoid contact with your eyes, ears, mouth and genitals (private parts). Wash Face and genitals (private parts)  with your normal soap.   6. Wash thoroughly, paying special attention to the area where your surgery will be performed.  7. Thoroughly rinse your body with warm water from the neck down.  8. DO NOT shower/wash with your normal soap after using and rinsing off the CHG Soap.  9. Pat yourself dry  with a CLEAN TOWEL.  10. Wear CLEAN PAJAMAS to bed the night before surgery, wear comfortable clothes the morning of surgery  11. Place CLEAN SHEETS on your bed the night of your first shower and DO NOT SLEEP WITH PETS.    Day of Surgery:   Remember to brush your teeth WITH YOUR REGULAR TOOTHPASTE. Please shower the morning of surgery with the CHG soap Do not apply any deodorants/lotions. Please wear clean clothes to the hospital/surgery center.      Please read over the following fact sheets that you were given.

## 2019-09-21 NOTE — Patient Instructions (Signed)
Medication Instructions:  Your physician recommends that you continue on your current medications as directed. Please refer to the Current Medication list given to you today. *If you need a refill on your cardiac medications before your next appointment, please call your pharmacy*  Lab Work: NONE If you have labs (blood work) drawn today and your tests are completely normal, you will receive your results only by: . MyChart Message (if you have MyChart) OR . A paper copy in the mail If you have any lab test that is abnormal or we need to change your treatment, we will call you to review the results.  Testing/Procedures: NONE  Follow-Up: At [facility], you and your health needs are our priority.  As part of our continuing mission to provide you with exceptional heart care, we have created designated Provider Care Teams.  These Care Teams include your primary Cardiologist (physician) and Advanced Practice Providers (APPs -  Physician Assistants and Nurse Practitioners) who all work together to provide you with the care you need, when you need it.  Your next appointment:   3 month(s)  The format for your next appointment:   In Person  Provider:   Gayatri Acharya, MD  

## 2019-09-22 ENCOUNTER — Encounter (HOSPITAL_COMMUNITY)
Admission: RE | Admit: 2019-09-22 | Discharge: 2019-09-22 | Disposition: A | Discharge status: Home / Self Care | Payer: Medicare PPO | Source: Ambulatory Visit | Attending: Vascular Surgery | Admitting: Vascular Surgery

## 2019-09-22 ENCOUNTER — Encounter (HOSPITAL_COMMUNITY): Payer: Self-pay

## 2019-09-22 ENCOUNTER — Other Ambulatory Visit: Payer: Self-pay

## 2019-09-22 DIAGNOSIS — Z7982 Long term (current) use of aspirin: Secondary | ICD-10-CM | POA: Insufficient documentation

## 2019-09-22 DIAGNOSIS — M1990 Unspecified osteoarthritis, unspecified site: Secondary | ICD-10-CM | POA: Diagnosis not present

## 2019-09-22 DIAGNOSIS — Z01812 Encounter for preprocedural laboratory examination: Secondary | ICD-10-CM | POA: Diagnosis present

## 2019-09-22 DIAGNOSIS — I129 Hypertensive chronic kidney disease with stage 1 through stage 4 chronic kidney disease, or unspecified chronic kidney disease: Secondary | ICD-10-CM | POA: Insufficient documentation

## 2019-09-22 DIAGNOSIS — Z8673 Personal history of transient ischemic attack (TIA), and cerebral infarction without residual deficits: Secondary | ICD-10-CM | POA: Diagnosis not present

## 2019-09-22 DIAGNOSIS — Z9842 Cataract extraction status, left eye: Secondary | ICD-10-CM | POA: Insufficient documentation

## 2019-09-22 DIAGNOSIS — M810 Age-related osteoporosis without current pathological fracture: Secondary | ICD-10-CM | POA: Insufficient documentation

## 2019-09-22 DIAGNOSIS — I6521 Occlusion and stenosis of right carotid artery: Secondary | ICD-10-CM | POA: Diagnosis not present

## 2019-09-22 DIAGNOSIS — J45909 Unspecified asthma, uncomplicated: Secondary | ICD-10-CM | POA: Diagnosis not present

## 2019-09-22 DIAGNOSIS — Z87891 Personal history of nicotine dependence: Secondary | ICD-10-CM | POA: Insufficient documentation

## 2019-09-22 DIAGNOSIS — E039 Hypothyroidism, unspecified: Secondary | ICD-10-CM | POA: Insufficient documentation

## 2019-09-22 DIAGNOSIS — Z9841 Cataract extraction status, right eye: Secondary | ICD-10-CM | POA: Diagnosis not present

## 2019-09-22 DIAGNOSIS — Z79899 Other long term (current) drug therapy: Secondary | ICD-10-CM | POA: Insufficient documentation

## 2019-09-22 DIAGNOSIS — N189 Chronic kidney disease, unspecified: Secondary | ICD-10-CM | POA: Insufficient documentation

## 2019-09-22 HISTORY — DX: Presence of external hearing-aid: Z97.4

## 2019-09-22 HISTORY — DX: Chronic kidney disease, unspecified: N18.9

## 2019-09-22 HISTORY — DX: Presence of spectacles and contact lenses: Z97.3

## 2019-09-22 HISTORY — DX: Headache, unspecified: R51.9

## 2019-09-22 HISTORY — DX: Unspecified osteoarthritis, unspecified site: M19.90

## 2019-09-22 LAB — CBC
HCT: 44.6 % (ref 36.0–46.0)
Hemoglobin: 14 g/dL (ref 12.0–15.0)
MCH: 31.8 pg (ref 26.0–34.0)
MCHC: 31.4 g/dL (ref 30.0–36.0)
MCV: 101.4 fL — ABNORMAL HIGH (ref 80.0–100.0)
Platelets: 230 10*3/uL (ref 150–400)
RBC: 4.4 MIL/uL (ref 3.87–5.11)
RDW: 14.2 % (ref 11.5–15.5)
WBC: 7 10*3/uL (ref 4.0–10.5)
nRBC: 0 % (ref 0.0–0.2)

## 2019-09-22 LAB — COMPREHENSIVE METABOLIC PANEL
ALT: 15 U/L (ref 0–44)
AST: 12 U/L — ABNORMAL LOW (ref 15–41)
Albumin: 3.7 g/dL (ref 3.5–5.0)
Alkaline Phosphatase: 117 U/L (ref 38–126)
Anion gap: 8 (ref 5–15)
BUN: 15 mg/dL (ref 8–23)
CO2: 28 mmol/L (ref 22–32)
Calcium: 9.7 mg/dL (ref 8.9–10.3)
Chloride: 104 mmol/L (ref 98–111)
Creatinine, Ser: 0.74 mg/dL (ref 0.44–1.00)
GFR calc Af Amer: >60 mL/min (ref >60)
GFR calc non Af Amer: >60 mL/min (ref >60)
Glucose, Bld: 80 mg/dL (ref 70–99)
Potassium: 4 mmol/L (ref 3.5–5.1)
Sodium: 140 mmol/L (ref 135–145)
Total Bilirubin: 1 mg/dL (ref 0.3–1.2)
Total Protein: 7 g/dL (ref 6.5–8.1)

## 2019-09-22 LAB — SURGICAL PCR SCREEN
MRSA, PCR: NEGATIVE
Staphylococcus aureus: NEGATIVE

## 2019-09-22 LAB — ABO/RH: ABO/RH(D): O POS

## 2019-09-22 LAB — PROTIME-INR
INR: 1 (ref 0.8–1.2)
Prothrombin Time: 13.3 seconds (ref 11.4–15.2)

## 2019-09-22 LAB — TYPE AND SCREEN
ABO/RH(D): O POS
Antibody Screen: NEGATIVE

## 2019-09-22 LAB — APTT: aPTT: 29 seconds (ref 24–36)

## 2019-09-22 NOTE — Pre-Procedure Instructions (Addendum)
   Danine Hor Selinger  09/22/2019     [id number] - [place], [place] - [address] AT [address] [facility] [address] [place] [address] Phone: [phone] Fax: [phone]   Your procedure is scheduled on Thursday, September 29, 2019  Report to [facility] Admitting at 5:30 A.M.  Call this number if you have problems the morning of surgery:  [phone]   Remember: Brush your teeth the morning of surgery.   Do not eat or drink after midnight the night before surgery.   Take these medicines the morning of surgery with A SIP OF WATER : methimazole (TAPAZOLE),  SYMBICORT  Inhaler    fluticasone (FLONASE)  nasal spray If needed: ipratropium (ATROVENT)  nasal spray If needed: levalbuterol (XOPENEX) nebulizer for wheezing or shortness of breath If needed: albuterol (PROVENTIL HFA;VENTOLIN HFA)  inhaler (bring in with you on day of surgery)  Stop taking  vitamins, fish oil and herbal medications. Do not take any NSAIDs ie: Ibuprofen, Advil, Naproxen (Aleve), Motrin, EXCEDRIN MIGRAINE,  BC and Goody Powder; stop now,   Do not wear jewelry, make-up or nail polish.  Do not wear lotions, powders, or perfumes, or deodorant.  Do not shave 48 hours prior to surgery.    Do not bring valuables to the hospital.  [facility] is not responsible for any belongings or valuables.  Contacts, dentures or bridgework may not be worn into surgery.  Leave your suitcase in the car.  After surgery it may be brought to your room. For patients admitted to the hospital, discharge time will be determined by your treatment team. Patients discharged the day of surgery will not be allowed to drive home.  Special instructions: See " [facility] Preparing For Surgery " sheet.  Please read over the following fact sheets that you were given. Pain Booklet, Coughing and Deep Breathing, MRSA Information and Surgical Site Infection Prevention

## 2019-09-22 NOTE — Progress Notes (Signed)
Pt verbalized understanding of all pre-op instructions. 

## 2019-09-22 NOTE — Progress Notes (Signed)
Pt denies SOB and chest pain. Pt stated that she is under the care of Dr. Jacques Navy, Cardiology and Dr. Johnn Hai, PCP. Pt denies having a stress test and cardiac cath. Pt denies having a chest x ray but stated that an EKG was performed at recent cardiology appointment . Nurse contacted office staff to have EKG scanned into Epic and was told that EKG probably was not signed by cardiologist.  Pt was unable to void at PAT appointment; STAT UA needed on DOS.  Pt chart forwarded to PA, Anesthesiology, for review.

## 2019-09-23 NOTE — Anesthesia Preprocedure Evaluation (Addendum)
Anesthesia Evaluation  Patient identified by MRN, date of birth, ID band Patient awake    Reviewed: Allergy & Precautions, NPO status , Patient's Chart, lab work & pertinent test results  Airway Mallampati: II  TM Distance: >3 FB Neck ROM: Full    Dental no notable dental hx.    Pulmonary asthma , COPD,  COPD inhaler, former smoker,    Pulmonary exam normal breath sounds clear to auscultation       Cardiovascular hypertension, Pt. on medications Normal cardiovascular exam Rhythm:Regular Rate:Normal     Neuro/Psych  Headaches, TIAnegative psych ROS   GI/Hepatic negative GI ROS, Neg liver ROS,   Endo/Other  Hyperthyroidism   Renal/GU Renal disease     Musculoskeletal  (+) Arthritis ,   Abdominal   Peds  Hematology HLD   Anesthesia Other Findings RIGHT CAROTID ARTERY STENOSIS  Reproductive/Obstetrics                            Anesthesia Physical Anesthesia Plan  ASA: III  Anesthesia Plan: General   Post-op Pain Management:    Induction: Intravenous  PONV Risk Score and Plan: 3 and Ondansetron, Dexamethasone and Treatment may vary due to age or medical condition  Airway Management Planned: Oral ETT  Additional Equipment: Arterial line  Intra-op Plan:   Post-operative Plan: Extubation in OR  Informed Consent: I have reviewed the patients History and Physical, chart, labs and discussed the procedure including the risks, benefits and alternatives for the proposed anesthesia with the patient or authorized representative who has indicated his/her understanding and acceptance.     Dental advisory given  Plan Discussed with: CRNA  Anesthesia Plan Comments: (Reviewed PAT note written by Shonna Chock, PA-C. She is on chronic T[place]oz[place] for hyperthyroidism. )      Anesthesia Quick Evaluation

## 2019-09-23 NOTE — Progress Notes (Addendum)
Anesthesia Chart Review:  Case: [id number] Date/Time: 09/29/19 0715   Procedure: REDO OF RIGHT ENDARTERECTOMY CAROTID (Right )   Anesthesia type: General   Pre-op diagnosis: RIGHT CAROTID ARTERY STENOSIS   Location: [facility] OR ROOM 11 / [place] OR   Surgeons: Waynetta Sandy, MD      DISCUSSION: Patient is an 84 year old female scheduled for the above procedure. 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   History includes former smoker, HTN, asthma, hyperthyroidism/multi-nodular goiter (diagnosed ~ 1999; she chose chronic Tapzole), dyslipidemia, CKD ("in remission"), carotid artery stenosis (s/p right CEA 07/10/00), CVA (remote left MCA infarct 03/21/08 MRI during admission for probable transient global amnesia 03/2008), hearing aids.  Preoperative cardiac clearance appointment with Dr. Margaretann Loveless on 09/21/19. Status is still pending, so chart will be left for follow-up.  COVID-19 test is scheduled for 09/26/18.  ADDENDUM 09/28/19 11:31 AM: Dr. Delphina Cahill preoperative clearance note reviewed. No further cardiac testing recommended. She is felt to moderate but acceptable CV risk for planned procedure. COVID-19 test negative.   VS: BP (!) 149/79   Pulse 68   Temp 36.9 C (Oral)   Resp 18   Ht 5\' 3"  (1.6 m)   Wt 61.3 kg   SpO2 99%   BMI 23.94 kg/m    PROVIDERS: Jonathon Jordan, MD is PCP  Renato Shin, MD is endocrinologist. Last evaluation 06/22/19. Hyperthyroidism was well controlled on Tapazole.  Cherlynn Kaiser, MD is cardiologist   LABS: Labs reviewed: Acceptable for surgery. Unable to void at PAT, so plan for UA on the day of surgery. TSH and free T4 WNL 06/22/19. (all labs ordered are listed, but only abnormal results are displayed)  Labs Reviewed  CBC - Abnormal; Notable for the following components:      Result Value   MCV 101.4 (*)    All other components within normal limits  COMPREHENSIVE  METABOLIC PANEL - Abnormal; Notable for the following components:   AST 12 (*)    All other components within normal limits  SURGICAL PCR SCREEN  APTT  PROTIME-INR  TYPE AND SCREEN  ABO/RH     IMAGES: CT soft tissue neck 07/27/19: IMPRESSION: 1. Advanced atherosclerotic disease. Postop carotid endarterectomy bilaterally 2. 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 Possible small amount of flow within the aneurysm versus calcified thrombus. 3. Atherosclerotic disease in the carotid bifurcation bilaterally. Estimated 50% diameter stenosis right internal carotid artery. Arterial stenosis not accurately measured on this study of the neck soft tissues. 4. Multinodular goiter. See prior ultrasound report for recommendations.   US Thyroid 07/01/19: IMPRESSION: 1. Indeterminate lesion or mass in the lateral right neck that measures up to 3.3 cm. This lesion appears to be separate from the thyroid tissue. Structure is indeterminate and recommend further characterization with a neck CT with IV contrast. 2. Multinodular goiter. 3. Nodule #1 in the isthmus meets criteria for 1 year follow-up. 4. Nodule #8 in the inferior left thyroid lobe meets criteria for follow-up but this is likely benign based on the images from 2005.   EKG: 09/21/19 ([facility]):  Sinus rhythm with occasional PVCs Left axis deviation Minimal voltage criteria for LVH, may be normal variant Septal infarct, age undetermined   CV: Carotid US 09/02/19: Summary: - Right Carotid: Velocities in the right ICA are consistent with a 1-39% stenosis.  Images are consistent with partially thrombosed pseudoaneurysm                adjacent to common carotid. - Left Carotid: Velocities in the left ICA are consistent with a 1-39% stenosis. - Vertebrals:  Bilateral vertebral arteries demonstrate antegrade flow. - Subclavians: Normal flow hemodynamics were  seen in bilateral subclavian              arteries.  Echo 10/02/15: Study Conclusions - Left ventricle: The cavity size was normal. There was mild   concentric hypertrophy. Systolic function was normal. The   estimated ejection fraction was in the range of 60% to 65%. Wall   motion was normal; there were no regional wall motion   abnormalities. Doppler parameters are consistent with abnormal   left ventricular relaxation (grade 1 diastolic dysfunction). - Aortic valve: Trileaflet; mildly thickened, mildly calcified   leaflets. There was trivial regurgitation. - Mitral valve: Calcified annulus. Mildly thickened leaflets .   Systolic bowing without prolapse. - Tricuspid valve: There was trivial regurgitation. Impressions: - No cardiac source of emboli was indentified.    Past Medical History:  Diagnosis Date  . Allergy   . Arthritis   . ASTHMA 05/03/2009  . Asthma   . Chronic kidney disease    " in remission"  . CVA (cerebral infarction)   . Dyslipidemia   . Headache   . Hearing aid worn    B/L  . HYPERTENSION 04/30/2007  . HYPERTHYROIDISM 06/17/2007  . Menopause   . Multinodular goiter   . OSTEOPOROSIS 04/30/2007  . Wears glasses     Past Surgical History:  Procedure Laterality Date  . BACK SURGERY    . BREAST EXCISIONAL BIOPSY Left   . carotid artery surgery    . CATARACT EXTRACTION W/ INTRAOCULAR LENS  IMPLANT, BILATERAL    . NECK SURGERY    . OTHER SURGICAL HISTORY     discectomy    MEDICATIONS: . albuterol (PROVENTIL HFA;VENTOLIN HFA) 108 (90 BASE) MCG/ACT inhaler  . alendronate (FOSAMAX) 70 MG tablet  . aspirin-acetaminophen-caffeine (EXCEDRIN MIGRAINE) 250-250-65 MG tablet  . azelastine (ASTELIN) 0.1 % nasal spray  . b complex vitamins tablet  . BIOTIN PO  . Calcium Carbonate-Vitamin D (CALCIUM 500 + D PO)  . ezetimibe-simvastatin (VYTORIN) 10-20 MG per tablet  . fluticasone (FLONASE) 50 MCG/ACT nasal spray  . ipratropium (ATROVENT) 0.03 % nasal spray   . levalbuterol (XOPENEX) 0.63 MG/3ML nebulizer solution  . levocetirizine (XYZAL) 5 MG tablet  . methimazole (TAPAZOLE) 10 MG tablet  . methimazole (TAPAZOLE) 5 MG tablet  . mirabegron ER (MYRBETRIQ) 50 MG TB24 tablet  . montelukast (SINGULAIR) 10 MG tablet  . Multiple Vitamin (MULTIVITAMIN WITH MINERALS) TABS tablet  . SYMBICORT 160-4.5 MCG/ACT inhaler  . telmisartan (MICARDIS) 80 MG tablet   No current facility-administered medications for this encounter.    Shonna Chock, PA-C Surgical Short Stay/Anesthesiology [facility] Phone [phone] [facility] Phone [phone] 09/23/2019 3:15 PM

## 2019-09-27 ENCOUNTER — Other Ambulatory Visit (HOSPITAL_COMMUNITY)
Admission: RE | Admit: 2019-09-27 | Discharge: 2019-09-27 | Disposition: A | Discharge status: Home / Self Care | Payer: Medicare PPO | Source: Ambulatory Visit | Attending: Vascular Surgery | Admitting: Vascular Surgery

## 2019-09-27 LAB — SARS CORONAVIRUS 2 (TAT 6-24 HRS): SARS Coronavirus 2: NEGATIVE

## 2019-09-28 NOTE — Progress Notes (Signed)
Cardiology Office Note:    Date:  09/21/2019   ID:  [name], DOB Jun 25, 1936, MRN [id number]  PCP:  Mila Palmer, MD  Cardiologist:  Parke Poisson, MD  Electrophysiologist:  None   Referring MD: Mila Palmer, MD   Chief Complaint: Preoperative cardiovascular optimization  History of Present Illness:    Ariel Lambert is a 84 y.o. female with a hx of bilateral carotid endarterectomy in 2001, at the time she had stroke/TIA.  She presents in anticipation of plan for repair of probable pseudoaneurysm off the inferior aspect of the previous patch angioplasty of the carotid endarterectomy on the right.  She presents today with her daughter and we have reviewed her care together with shared decision making.  Preoperative cardiovascular evaluation Pertinent past cardiac history: HTN, HLD Prior cardiac workup: Normal EF 2017 History of valve disease: no hemodynamically significant valve disease History of CAD/PAD/CVA/TIA: CVA/TIA 2001, s/p bilateral CEA History of heart failure: none History of arrhythmia: none On anticoagulation: none  Additional history (hypertension, diabetes/on insulin, CKD/current creatinine, OSA, anesthesia complications): Patient has hypertension and takes telmisartan Current symptoms: Patient feels well overall and is able to complete her activities of daily living.  She denies chest pain, shortness of breath, lower extremity swelling.  She feels well and has no acute concerns Functional capacity: Greater than 4 METS without symptoms  She overall tells me that she does quite well, and she does not feel that she has any health issues.  She has no known history of anesthesia complications.  She denies chest pain with her activities of daily living.  She is eager to have her carotid procedure taken care of.   Past Medical History:  Diagnosis Date  . Allergy   . Arthritis   . ASTHMA 05/03/2009  . Asthma   . Chronic kidney disease    "  in remission"  . CVA (cerebral infarction)   . Dyslipidemia   . Headache   . Hearing aid worn    B/L  . HYPERTENSION 04/30/2007  . HYPERTHYROIDISM 06/17/2007  . Menopause   . Multinodular goiter   . OSTEOPOROSIS 04/30/2007  . Wears glasses     Past Surgical History:  Procedure Laterality Date  . BACK SURGERY    . BREAST EXCISIONAL BIOPSY Left   . carotid artery surgery    . CATARACT EXTRACTION W/ INTRAOCULAR LENS  IMPLANT, BILATERAL    . NECK SURGERY    . OTHER SURGICAL HISTORY     discectomy    Current Medications: No outpatient medications have been marked as taking for the 09/21/19 encounter (Office Visit) with Parke Poisson, MD.     Allergies:   Cetirizine & related, Codeine, Meperidine hcl, Nabumetone, Naproxen, and Sulfonamide derivatives   Social History   Socioeconomic History  . Marital status: Widowed    Spouse name: Not on file  . Number of children: Not on file  . Years of education: Not on file  . Highest education level: Not on file  Occupational History  . Occupation: Magazine features editor: RETIRED  Tobacco Use  . Smoking status: Former Smoker    Types: Cigarettes  . Smokeless tobacco: Never Used  . Tobacco comment: "Quit smoking cigarettes in my 40's "  Substance and Sexual Activity  . Alcohol use: Yes    Alcohol/week: 0.0 standard drinks    Comment: Socially  . Drug use: No  . Sexual activity: Not on file  Other Topics Concern  .  Not on file  Social History Narrative   Lives alone.   Social Determinants of Health   Financial Resource Strain:   . Difficulty of Paying Living Expenses: Not on file  Food Insecurity:   . Worried About Programme researcher, broadcasting/film/video in the Last Year: Not on file  . Ran Out of Food in the Last Year: Not on file  Transportation Needs:   . Lack of Transportation (Medical): Not on file  . Lack of Transportation (Non-Medical): Not on file  Physical Activity:   . Days of Exercise per Week: Not on file  . Minutes of  Exercise per Session: Not on file  Stress:   . Feeling of Stress : Not on file  Social Connections:   . Frequency of Communication with Friends and Family: Not on file  . Frequency of Social Gatherings with Friends and Family: Not on file  . Attends Religious Services: Not on file  . Active Member of Clubs or Organizations: Not on file  . Attends Banker Meetings: Not on file  . Marital Status: Not on file     Family History: The patient's family history includes Breast cancer (age of onset: 29) in her mother; Heart disease in an other family member.  ROS:   Please see the history of present illness.    All other systems reviewed and are negative.  EKGs/Labs/Other Studies Reviewed:    The following studies were reviewed today:  EKG: Sinus rhythm, PVC, left axis deviation, minimal voltage criteria for LVH.  Documentation of a septal infarct.  However this was present on prior ECGs, and echocardiogram performed that temporally correlates without wall motion abnormality noted.  Recent Labs: 06/22/2019: TSH 3.45 09/22/2019: ALT 15; BUN 15; Creatinine, Ser 0.74; Hemoglobin 14.0; Platelets 230; Potassium 4.0; Sodium 140  Recent Lipid Panel No results found for: CHOL, TRIG, HDL, CHOLHDL, VLDL, LDLCALC, LDLDIRECT  Physical Exam:    VS:  BP (!) 143/91   Pulse 67   Temp (!) 97.2 F (36.2 C)   Ht 5\' 3"  (1.6 m)   Wt 134 lb 12.8 oz (61.1 kg)   SpO2 99%   BMI 23.88 kg/m     Wt Readings from Last 5 Encounters:  09/22/19 135 lb 2 oz (61.3 kg)  09/21/19 134 lb 12.8 oz (61.1 kg)  09/02/19 134 lb 14.4 oz (61.2 kg)  06/22/19 133 lb (60.3 kg)  02/23/19 129 lb 3.2 oz (58.6 kg)     Constitutional: No acute distress Eyes: sclera non-icteric, normal conjunctiva and lids ENMT: normal dentition, moist mucous membranes Cardiovascular: regular rhythm, normal rate, no murmurs. S1 and S2 normal. Radial pulses normal bilaterally. No jugular venous distention.  Respiratory: clear to  auscultation bilaterally GI : normal bowel sounds, soft and nontender. No distention.   MSK: extremities warm, well perfused. No edema.  NEURO: grossly nonfocal exam, moves all extremities. PSYCH: alert and oriented x 3, normal mood and affect.      ASSESSMENT:    1. Preoperative clearance   2. Essential hypertension    PLAN:    Pseudoaneurysm of previous right carotid endarterectomy site-patient is anticipating surgery on 09/29/2019.  She is asymptomatic with no chest pain or shortness of breath, and has no symptoms of heart failure at this time.  There is no indication for further testing, and no further medication adjustments are required at this time.  The patient, her daughter, and I participated in shared decision making and determined that no further testing is  required.  The patient's RCRI risk is moderate and gives her risk of 6.6% of periprocedural cardiovascular events.  I discussed this with the patient and her daughter.  If this level of risk is felt to be acceptable from patient and surgeon standpoint, the patient should be considered optimized for surgery.  She tells me that the in office reading on her blood pressure today is higher than it usually runs, therefore we will make no significant changes to her therapy at this time.  Further adjustments of blood pressure therapy can be performed post procedurally as needed.   Total time of encounter: 30 minutes total time of encounter, including 20 minutes spent in face-to-face patient care. This time includes coordination of care and counseling regarding perioperative risk. Remainder of non-face-to-face time involved reviewing chart documents/testing relevant to the patient encounter and documentation in the medical record.  Cherlynn Kaiser, MD [place]  [facility]   Medication Adjustments/Labs and Tests Ordered: Current medicines are reviewed at length with the patient today.  Concerns regarding medicines are  outlined above.  Orders Placed This Encounter  Procedures  . EKG 12-Lead   No orders of the defined types were placed in this encounter.   Patient Instructions  Medication Instructions:  Your physician recommends that you continue on your current medications as directed. Please refer to the Current Medication list given to you today. *If you need a refill on your cardiac medications before your next appointment, please call your pharmacy*  Lab Work: NONE If you have labs (blood work) drawn today and your tests are completely normal, you will receive your results only by: Marland Kitchen MyChart Message (if you have MyChart) OR . A paper copy in the mail If you have any lab test that is abnormal or we need to change your treatment, we will call you to review the results.  Testing/Procedures: NONE  Follow-Up: At [facility], you and your health needs are our priority.  As part of our continuing mission to provide you with exceptional heart care, we have created designated Provider Care Teams.  These Care Teams include your primary Cardiologist (physician) and Advanced Practice Providers (APPs -  Physician Assistants and Nurse Practitioners) who all work together to provide you with the care you need, when you need it.  Your next appointment:   3 month(s)  The format for your next appointment:   In Person  Provider:   Cherlynn Kaiser, MD

## 2019-09-28 NOTE — Telephone Encounter (Signed)
Patient should be considered optimized for CEA tomorrow. Documentation from 09/21/19 signed and available in chart.

## 2019-09-28 NOTE — Telephone Encounter (Signed)
Carollee Herter from [facility] is calling stating they Preop Clearance for the surgery schedule for tomorrow 09/29/19 has not been received. Please advise.

## 2019-09-28 NOTE — Telephone Encounter (Signed)
Hi. Dr. Jacques Navy! Looks like you saw this patient on 09/21/2019 for a pre-op evaluation. Is she OK to proceed with CEA scheduled for tomorrow?  Please route response back to P [facility] PREOP.  Thanks so much!

## 2019-09-28 NOTE — Telephone Encounter (Signed)
   Primary Cardiologist: Parke Poisson, MD  Chart reviewed as part of pre-operative protocol coverage. Patient was seen by Dr. Jacques Navy on 09/21/2019 for pre-op evaluation.   Per this office visit note:  "She is asymptomatic with no chest pain or shortness of breath, and has no symptoms of heart failure at this time. There is no indication for further testing, and no further medication adjustments are required at this time.  The patient, her daughter, and I participated in shared decision making and determined that no further testing is required.  The patient's RCRI risk is moderate and gives her risk of 6.6% of periprocedural cardiovascular events.  I discussed this with the patient and her daughter.  If this level of risk is felt to be acceptable from patient and surgeon standpoint, the patient should be considered optimized for surgery."  I will route this recommendation to the requesting party via Epic fax function and remove from pre-op pool.  Please call with questions.  Corrin Parker, PA-C 09/28/2019, 11:29 AM

## 2019-09-29 ENCOUNTER — Inpatient Hospital Stay (HOSPITAL_COMMUNITY)
Admission: RE | Admit: 2019-09-29 | Discharge: 2019-09-30 | DRG: 254 | Disposition: A | Discharge status: Home / Self Care | Payer: Medicare PPO | Attending: Vascular Surgery | Admitting: Vascular Surgery

## 2019-09-29 ENCOUNTER — Encounter (HOSPITAL_COMMUNITY): Payer: Self-pay | Admitting: Vascular Surgery

## 2019-09-29 ENCOUNTER — Other Ambulatory Visit: Payer: Self-pay

## 2019-09-29 ENCOUNTER — Inpatient Hospital Stay (HOSPITAL_COMMUNITY): Payer: Medicare PPO | Admitting: Vascular Surgery

## 2019-09-29 ENCOUNTER — Encounter (HOSPITAL_COMMUNITY)
Admission: RE | Disposition: A | Discharge status: Home / Self Care | Payer: Self-pay | Source: Home / Self Care | Attending: Vascular Surgery

## 2019-09-29 ENCOUNTER — Inpatient Hospital Stay (HOSPITAL_COMMUNITY): Payer: Medicare PPO

## 2019-09-29 DIAGNOSIS — Z87891 Personal history of nicotine dependence: Secondary | ICD-10-CM

## 2019-09-29 DIAGNOSIS — I720 Aneurysm of carotid artery: Secondary | ICD-10-CM | POA: Diagnosis not present

## 2019-09-29 DIAGNOSIS — T82868A Thrombosis of vascular prosthetic devices, implants and grafts, initial encounter: Secondary | ICD-10-CM | POA: Diagnosis present

## 2019-09-29 DIAGNOSIS — Z79899 Other long term (current) drug therapy: Secondary | ICD-10-CM | POA: Diagnosis not present

## 2019-09-29 DIAGNOSIS — R29810 Facial weakness: Secondary | ICD-10-CM | POA: Diagnosis present

## 2019-09-29 DIAGNOSIS — Z20822 Contact with and (suspected) exposure to covid-19: Secondary | ICD-10-CM | POA: Diagnosis present

## 2019-09-29 DIAGNOSIS — I10 Essential (primary) hypertension: Secondary | ICD-10-CM | POA: Diagnosis present

## 2019-09-29 DIAGNOSIS — I671 Cerebral aneurysm, nonruptured: Secondary | ICD-10-CM | POA: Diagnosis present

## 2019-09-29 DIAGNOSIS — Y712 Prosthetic and other implants, materials and accessory cardiovascular devices associated with adverse incidents: Secondary | ICD-10-CM | POA: Diagnosis present

## 2019-09-29 DIAGNOSIS — E0590 Thyrotoxicosis, unspecified without thyrotoxic crisis or storm: Secondary | ICD-10-CM | POA: Diagnosis present

## 2019-09-29 DIAGNOSIS — Y832 Surgical operation with anastomosis, bypass or graft as the cause of abnormal reaction of the patient, or of later complication, without mention of misadventure at the time of the procedure: Secondary | ICD-10-CM | POA: Diagnosis present

## 2019-09-29 DIAGNOSIS — J449 Chronic obstructive pulmonary disease, unspecified: Secondary | ICD-10-CM | POA: Diagnosis present

## 2019-09-29 HISTORY — PX: ENDARTERECTOMY: SHX5162

## 2019-09-29 LAB — URINALYSIS, ROUTINE W REFLEX MICROSCOPIC
Bacteria, UA: NONE SEEN
Bilirubin Urine: NEGATIVE
Glucose, UA: NEGATIVE mg/dL
Hgb urine dipstick: NEGATIVE
Ketones, ur: NEGATIVE mg/dL
Nitrite: NEGATIVE
Protein, ur: 30 mg/dL — AB
Specific Gravity, Urine: 1.023 (ref 1.005–1.030)
pH: 5 (ref 5.0–8.0)

## 2019-09-29 LAB — POCT ACTIVATED CLOTTING TIME: Activated Clotting Time: 235 seconds

## 2019-09-29 SURGERY — ENDARTERECTOMY, CAROTID
Anesthesia: General | Site: Neck | Laterality: Right

## 2019-09-29 MED ORDER — ACETAMINOPHEN 10 MG/ML IV SOLN
INTRAVENOUS | Status: AC
  Filled 2019-09-29: qty 100

## 2019-09-29 MED ORDER — ACETAMINOPHEN 10 MG/ML IV SOLN
1000.0000 mg | Freq: Once | INTRAVENOUS | Status: DC | PRN
  Administered 2019-09-29: 1000 mg via INTRAVENOUS

## 2019-09-29 MED ORDER — 0.9 % SODIUM CHLORIDE (POUR BTL) OPTIME
TOPICAL | Status: DC | PRN
  Administered 2019-09-29: 1000 mL

## 2019-09-29 MED ORDER — LACTATED RINGERS IV SOLN: INTRAVENOUS | Status: DC | PRN

## 2019-09-29 MED ORDER — MOMETASONE FURO-FORMOTEROL FUM 200-5 MCG/ACT IN AERO
2.0000 | INHALATION_SPRAY | Freq: Two times a day (BID) | RESPIRATORY_TRACT | Status: DC
  Administered 2019-09-29: 2 via RESPIRATORY_TRACT
  Filled 2019-09-29: qty 8.8

## 2019-09-29 MED ORDER — GUAIFENESIN-DM 100-10 MG/5ML PO SYRP: 15.0000 mL | ORAL_SOLUTION | ORAL | Status: DC | PRN

## 2019-09-29 MED ORDER — HEPARIN SODIUM (PORCINE) 1000 UNIT/ML IJ SOLN
INTRAMUSCULAR | Status: DC | PRN
  Administered 2019-09-29: 6000 [IU] via INTRAVENOUS
  Administered 2019-09-29: 3000 [IU] via INTRAVENOUS

## 2019-09-29 MED ORDER — CEFAZOLIN SODIUM-DEXTROSE 2-4 GM/100ML-% IV SOLN
2.0000 g | INTRAVENOUS | Status: AC
  Administered 2019-09-29: 2 g via INTRAVENOUS
  Filled 2019-09-29: qty 100

## 2019-09-29 MED ORDER — PHENOL 1.4 % MT LIQD: 1.0000 | OROMUCOSAL | Status: DC | PRN

## 2019-09-29 MED ORDER — MIRABEGRON ER 50 MG PO TB24
50.0000 mg | ORAL_TABLET | Freq: Every day | ORAL | Status: DC
  Administered 2019-09-30: 50 mg via ORAL
  Filled 2019-09-29: qty 1

## 2019-09-29 MED ORDER — CHLORHEXIDINE GLUCONATE CLOTH 2 % EX PADS: 6.0000 | MEDICATED_PAD | Freq: Once | CUTANEOUS | Status: DC

## 2019-09-29 MED ORDER — ASPIRIN EC 81 MG PO TBEC
81.0000 mg | DELAYED_RELEASE_TABLET | Freq: Every day | ORAL | Status: DC
  Administered 2019-09-30: 81 mg via ORAL
  Filled 2019-09-29: qty 1

## 2019-09-29 MED ORDER — SUGAMMADEX SODIUM 200 MG/2ML IV SOLN
INTRAVENOUS | Status: DC | PRN
  Administered 2019-09-29: 50 mg via INTRAVENOUS

## 2019-09-29 MED ORDER — PANTOPRAZOLE SODIUM 40 MG PO TBEC
40.0000 mg | DELAYED_RELEASE_TABLET | Freq: Every day | ORAL | Status: DC
  Administered 2019-09-30: 40 mg via ORAL
  Filled 2019-09-29 (×2): qty 1

## 2019-09-29 MED ORDER — PHENYLEPHRINE HCL-NACL 10-0.9 MG/250ML-% IV SOLN
INTRAVENOUS | Status: DC | PRN
  Administered 2019-09-29: 40 ug/min via INTRAVENOUS

## 2019-09-29 MED ORDER — FENTANYL CITRATE (PF) 100 MCG/2ML IJ SOLN
INTRAMUSCULAR | Status: AC
  Filled 2019-09-29: qty 2

## 2019-09-29 MED ORDER — SODIUM CHLORIDE 0.9 % IV SOLN: INTRAVENOUS | Status: DC

## 2019-09-29 MED ORDER — SODIUM CHLORIDE 0.9 % IV SOLN
INTRAVENOUS | Status: DC | PRN
  Administered 2019-09-29: 500 mL

## 2019-09-29 MED ORDER — DOCUSATE SODIUM 100 MG PO CAPS
100.0000 mg | ORAL_CAPSULE | Freq: Every day | ORAL | Status: DC
  Filled 2019-09-29: qty 1

## 2019-09-29 MED ORDER — SODIUM CHLORIDE 0.9 % IV SOLN: 500.0000 mL | Freq: Once | INTRAVENOUS | Status: DC | PRN

## 2019-09-29 MED ORDER — MAGNESIUM SULFATE 2 GM/50ML IV SOLN: 2.0000 g | Freq: Every day | INTRAVENOUS | Status: DC | PRN

## 2019-09-29 MED ORDER — METHIMAZOLE 5 MG PO TABS
5.0000 mg | ORAL_TABLET | ORAL | Status: DC
  Administered 2019-09-29: 5 mg via ORAL
  Filled 2019-09-29: qty 1

## 2019-09-29 MED ORDER — FLUTICASONE PROPIONATE 50 MCG/ACT NA SUSP
1.0000 | Freq: Every day | NASAL | Status: DC
  Administered 2019-09-30: 1 via NASAL
  Filled 2019-09-29: qty 16

## 2019-09-29 MED ORDER — FENTANYL CITRATE (PF) 100 MCG/2ML IJ SOLN
25.0000 ug | INTRAMUSCULAR | Status: DC | PRN
  Administered 2019-09-29 (×4): 25 ug via INTRAVENOUS

## 2019-09-29 MED ORDER — METHIMAZOLE 5 MG PO TABS: 5.0000 mg | ORAL_TABLET | ORAL | Status: DC

## 2019-09-29 MED ORDER — METOPROLOL TARTRATE 5 MG/5ML IV SOLN: 2.0000 mg | INTRAVENOUS | Status: DC | PRN

## 2019-09-29 MED ORDER — HYDROMORPHONE HCL 1 MG/ML IJ SOLN
0.5000 mg | Freq: Once | INTRAMUSCULAR | Status: AC
  Administered 2019-09-29: 0.5 mg via INTRAVENOUS

## 2019-09-29 MED ORDER — LABETALOL HCL 5 MG/ML IV SOLN
10.0000 mg | INTRAVENOUS | Status: DC | PRN
  Administered 2019-09-30: 10 mg via INTRAVENOUS
  Filled 2019-09-29: qty 4

## 2019-09-29 MED ORDER — ACETAMINOPHEN 325 MG PO TABS
325.0000 mg | ORAL_TABLET | ORAL | Status: DC | PRN
  Administered 2019-09-29 – 2019-09-30 (×3): 650 mg via ORAL
  Filled 2019-09-29 (×4): qty 2

## 2019-09-29 MED ORDER — ACETAMINOPHEN 325 MG RE SUPP: 325.0000 mg | RECTAL | Status: DC | PRN

## 2019-09-29 MED ORDER — SODIUM CHLORIDE 0.9 % IV SOLN
0.0125 ug/kg/min | INTRAVENOUS | Status: AC
  Administered 2019-09-29: .075 ug/kg/min via INTRAVENOUS
  Filled 2019-09-29: qty 2000

## 2019-09-29 MED ORDER — ROCURONIUM BROMIDE 50 MG/5ML IV SOSY
PREFILLED_SYRINGE | INTRAVENOUS | Status: DC | PRN
  Administered 2019-09-29: 60 mg via INTRAVENOUS

## 2019-09-29 MED ORDER — ONDANSETRON HCL 4 MG/2ML IJ SOLN
INTRAMUSCULAR | Status: DC | PRN
  Administered 2019-09-29: 4 mg via INTRAVENOUS

## 2019-09-29 MED ORDER — ALBUTEROL SULFATE (2.5 MG/3ML) 0.083% IN NEBU: 3.0000 mL | INHALATION_SOLUTION | Freq: Four times a day (QID) | RESPIRATORY_TRACT | Status: DC | PRN

## 2019-09-29 MED ORDER — PROPOFOL 10 MG/ML IV BOLUS
INTRAVENOUS | Status: AC
  Filled 2019-09-29: qty 20

## 2019-09-29 MED ORDER — HYDRALAZINE HCL 20 MG/ML IJ SOLN
5.0000 mg | INTRAMUSCULAR | Status: DC | PRN
  Administered 2019-09-29: 5 mg via INTRAVENOUS
  Filled 2019-09-29: qty 1

## 2019-09-29 MED ORDER — PROTAMINE SULFATE 10 MG/ML IV SOLN
INTRAVENOUS | Status: DC | PRN
  Administered 2019-09-29: 50 mg via INTRAVENOUS

## 2019-09-29 MED ORDER — MONTELUKAST SODIUM 10 MG PO TABS
10.0000 mg | ORAL_TABLET | Freq: Every day | ORAL | Status: DC
  Administered 2019-09-29: 10 mg via ORAL
  Filled 2019-09-29: qty 1

## 2019-09-29 MED ORDER — IRBESARTAN 150 MG PO TABS
150.0000 mg | ORAL_TABLET | Freq: Every day | ORAL | Status: DC
  Administered 2019-09-29 – 2019-09-30 (×2): 150 mg via ORAL
  Filled 2019-09-29 (×2): qty 1

## 2019-09-29 MED ORDER — AZELASTINE HCL 0.1 % NA SOLN
1.0000 | Freq: Every day | NASAL | Status: DC
  Administered 2019-09-29: 1 via NASAL
  Filled 2019-09-29: qty 30

## 2019-09-29 MED ORDER — FENTANYL CITRATE (PF) 100 MCG/2ML IJ SOLN
INTRAMUSCULAR | Status: DC | PRN
  Administered 2019-09-29: 50 ug via INTRAVENOUS

## 2019-09-29 MED ORDER — LIDOCAINE 2% (20 MG/ML) 5 ML SYRINGE
INTRAMUSCULAR | Status: DC | PRN
  Administered 2019-09-29: 20 mg via INTRAVENOUS

## 2019-09-29 MED ORDER — HEMOSTATIC AGENTS (NO CHARGE) OPTIME
TOPICAL | Status: DC | PRN
  Administered 2019-09-29: 1 via TOPICAL

## 2019-09-29 MED ORDER — MORPHINE SULFATE (PF) 2 MG/ML IV SOLN: 1.0000 mg | INTRAVENOUS | Status: DC | PRN

## 2019-09-29 MED ORDER — LIDOCAINE HCL (PF) 1 % IJ SOLN
INTRAMUSCULAR | Status: AC
  Filled 2019-09-29: qty 30

## 2019-09-29 MED ORDER — POTASSIUM CHLORIDE CRYS ER 20 MEQ PO TBCR: 20.0000 meq | EXTENDED_RELEASE_TABLET | Freq: Every day | ORAL | Status: DC | PRN

## 2019-09-29 MED ORDER — OXYCODONE-ACETAMINOPHEN 5-325 MG PO TABS
1.0000 | ORAL_TABLET | ORAL | Status: DC | PRN
  Administered 2019-09-30: 1 via ORAL
  Filled 2019-09-29: qty 2

## 2019-09-29 MED ORDER — PROPOFOL 10 MG/ML IV BOLUS
INTRAVENOUS | Status: DC | PRN
  Administered 2019-09-29: 40 mg via INTRAVENOUS
  Administered 2019-09-29: 60 mg via INTRAVENOUS

## 2019-09-29 MED ORDER — SODIUM CHLORIDE 0.9 % IV SOLN
INTRAVENOUS | Status: AC
  Filled 2019-09-29: qty 1.2

## 2019-09-29 MED ORDER — EZETIMIBE-SIMVASTATIN 10-20 MG PO TABS
1.0000 | ORAL_TABLET | Freq: Every day | ORAL | Status: DC
  Filled 2019-09-29: qty 1

## 2019-09-29 MED ORDER — DEXAMETHASONE SODIUM PHOSPHATE 4 MG/ML IJ SOLN
INTRAMUSCULAR | Status: DC | PRN
  Administered 2019-09-29: 10 mg via INTRAVENOUS

## 2019-09-29 MED ORDER — FENTANYL CITRATE (PF) 250 MCG/5ML IJ SOLN
INTRAMUSCULAR | Status: AC
  Filled 2019-09-29: qty 5

## 2019-09-29 MED ORDER — HYDROMORPHONE HCL 1 MG/ML IJ SOLN
INTRAMUSCULAR | Status: AC
  Filled 2019-09-29: qty 1

## 2019-09-29 MED ORDER — ONDANSETRON HCL 4 MG/2ML IJ SOLN: 4.0000 mg | Freq: Four times a day (QID) | INTRAMUSCULAR | Status: DC | PRN

## 2019-09-29 MED ORDER — ALUM & MAG HYDROXIDE-SIMETH 200-200-20 MG/5ML PO SUSP: 15.0000 mL | ORAL | Status: DC | PRN

## 2019-09-29 MED ORDER — ONDANSETRON HCL 4 MG/2ML IJ SOLN: 4.0000 mg | Freq: Once | INTRAMUSCULAR | Status: DC | PRN

## 2019-09-29 MED ORDER — CEFAZOLIN SODIUM-DEXTROSE 2-4 GM/100ML-% IV SOLN
2.0000 g | Freq: Three times a day (TID) | INTRAVENOUS | Status: AC
  Administered 2019-09-29 (×2): 2 g via INTRAVENOUS
  Filled 2019-09-29 (×2): qty 100

## 2019-09-29 MED ORDER — METHIMAZOLE 10 MG PO TABS
10.0000 mg | ORAL_TABLET | ORAL | Status: DC
  Administered 2019-09-30: 10 mg via ORAL
  Filled 2019-09-29: qty 1

## 2019-09-29 SURGICAL SUPPLY — 51 items
ADH SKN CLS APL DERMABOND .7 (GAUZE/BANDAGES/DRESSINGS) ×1
ADPR TBG 2 MALE LL ART (MISCELLANEOUS)
CANISTER SUCT 3000ML PPV (MISCELLANEOUS) ×2 IMPLANT
CATH ROBINSON RED A/P 18FR (CATHETERS) ×2 IMPLANT
CLIP VESOCCLUDE MED 24/CT (CLIP) ×2 IMPLANT
CLIP VESOCCLUDE SM WIDE 24/CT (CLIP) ×2 IMPLANT
CONT SPEC 4OZ CLIKSEAL STRL BL (MISCELLANEOUS) ×1 IMPLANT
COVER PROBE W GEL 5X96 (DRAPES) ×2 IMPLANT
COVER WAND RF STERILE (DRAPES) ×2 IMPLANT
DERMABOND ADVANCED (GAUZE/BANDAGES/DRESSINGS) ×1
DERMABOND ADVANCED .7 DNX12 (GAUZE/BANDAGES/DRESSINGS) ×1 IMPLANT
DRAIN CHANNEL 15F RND FF W/TCR (WOUND CARE) IMPLANT
ELECT REM PT RETURN 9FT ADLT (ELECTROSURGICAL) ×2
ELECTRODE REM PT RTRN 9FT ADLT (ELECTROSURGICAL) ×1 IMPLANT
EVACUATOR SILICONE 100CC (DRAIN) IMPLANT
GLOVE BIO SURGEON STRL SZ7.5 (GLOVE) ×2 IMPLANT
GOWN STRL REUS W/ TWL LRG LVL3 (GOWN DISPOSABLE) ×2 IMPLANT
GOWN STRL REUS W/ TWL XL LVL3 (GOWN DISPOSABLE) ×1 IMPLANT
GOWN STRL REUS W/TWL LRG LVL3 (GOWN DISPOSABLE) ×4
GOWN STRL REUS W/TWL XL LVL3 (GOWN DISPOSABLE) ×2
HEMOSTAT SNOW SURGICEL 2X4 (HEMOSTASIS) ×1 IMPLANT
INSERT FOGARTY SM (MISCELLANEOUS) ×2 IMPLANT
IV ADAPTER SYR DOUBLE MALE LL (MISCELLANEOUS) IMPLANT
KIT BASIN OR (CUSTOM PROCEDURE TRAY) ×2 IMPLANT
KIT SHUNT ARGYLE CAROTID ART 6 (VASCULAR PRODUCTS) ×2 IMPLANT
KIT TURNOVER KIT B (KITS) ×2 IMPLANT
NDL HYPO 25GX1X1/2 BEV (NEEDLE) IMPLANT
NDL SPNL 20GX3.5 QUINCKE YW (NEEDLE) IMPLANT
NEEDLE HYPO 25GX1X1/2 BEV (NEEDLE) IMPLANT
NEEDLE SPNL 20GX3.5 QUINCKE YW (NEEDLE) IMPLANT
NS IRRIG 1000ML POUR BTL (IV SOLUTION) ×6 IMPLANT
PACK CAROTID (CUSTOM PROCEDURE TRAY) ×2 IMPLANT
PAD ARMBOARD 7.5X6 YLW CONV (MISCELLANEOUS) ×4 IMPLANT
PATCH VASC XENOSURE 1CMX6CM (Vascular Products) ×2 IMPLANT
PATCH VASC XENOSURE 1X6 (Vascular Products) IMPLANT
POSITIONER HEAD DONUT 9IN (MISCELLANEOUS) ×2 IMPLANT
STOPCOCK 4 WAY LG BORE MALE ST (IV SETS) IMPLANT
SUT ETHILON 3 0 PS 1 (SUTURE) IMPLANT
SUT MNCRL AB 4-0 PS2 18 (SUTURE) ×2 IMPLANT
SUT PROLENE 5 0 C 1 24 (SUTURE) ×3 IMPLANT
SUT PROLENE 6 0 BV (SUTURE) ×3 IMPLANT
SUT PROLENE 7 0 BV 1 (SUTURE) ×1 IMPLANT
SUT PROLENE 7 0 BV1 MDA (SUTURE) ×1 IMPLANT
SUT SILK 3 0 (SUTURE)
SUT SILK 3-0 18XBRD TIE 12 (SUTURE) IMPLANT
SUT VIC AB 3-0 SH 27 (SUTURE) ×2
SUT VIC AB 3-0 SH 27X BRD (SUTURE) ×1 IMPLANT
SYR CONTROL 10ML LL (SYRINGE) IMPLANT
TOWEL GREEN STERILE (TOWEL DISPOSABLE) ×2 IMPLANT
TUBING ART PRESS 48 MALE/FEM (TUBING) IMPLANT
WATER STERILE IRR 1000ML POUR (IV SOLUTION) ×2 IMPLANT

## 2019-09-29 NOTE — Progress Notes (Signed)
  Day of Surgery Note    Subjective:  S/p repair right carotid pseudoaneurysm.  Seen on progressive care unit.  Right cheek and eye pain much improved   Vitals:   09/29/19 1406 09/29/19 1441  BP: (!) 141/67 (!) 173/97  Pulse: 74 78  Resp: 10   Temp:  98 F (36.7 C)  SpO2: 98% 94%    Incisions:   No edema or bleeding Extremities:  5/5 grip strength Cardiac:  RRR Lungs:  Non labored Neuro: A and O x 4, slight right facial droop. Mild hoarseness. Speech clear   Assessment/Plan:  This is a 84 y.o. female who is s/p repair of right carotid pseudoaneurysm. Henodynamically stable. Swallowing without difficulty -   Wendi Maya, PA-C 09/29/2019 3:23 PM [phone]

## 2019-09-29 NOTE — Progress Notes (Signed)
Pt. C/o 8/10 pain in right eye. Describes it as a sharpe pain. Nurse informed MD Randie Heinz MD cain at beside. Orders given to continue to monitor Nurse will continue to monitor

## 2019-09-29 NOTE — Transfer of Care (Signed)
Immediate Anesthesia Transfer of Care Note  Patient: Ariel Lambert  Procedure(s) Performed: REDO OF RIGHT ENDARTERECTOMY CAROTID (Right Neck)  Patient Location: PACU  Anesthesia Type:General  Level of Consciousness: awake  Airway & Oxygen Therapy: Patient Spontanous Breathing  Post-op Assessment: Report given to RN and Post -op Vital signs reviewed and stable  Post vital signs: Reviewed and stable  Last Vitals:  Vitals Value Taken Time  BP 167/87 09/29/19 1021  Temp    Pulse 83 09/29/19 1022  Resp 11 09/29/19 1022  SpO2 89 % 09/29/19 1022  Vitals shown include unvalidated device data.  Last Pain:  Vitals:   09/29/19 0619  TempSrc:   PainSc: 0-No pain         Complications: No apparent anesthesia complications

## 2019-09-29 NOTE — H&P (Signed)
   History and Physical Update  The patient was interviewed and re-examined.  The patient's previous History and Physical has been reviewed and is unchanged from recent office visit. Plan for right cea today.   Hussain Maimone C. Randie Heinz, MD [facility] [place] Office: [phone] Pager: [phone]   09/29/2019, 7:05 AM

## 2019-09-29 NOTE — Plan of Care (Signed)
  Problem: Education: Goal: Knowledge of General Education information will improve Description Including pain rating scale, medication(s)/side effects and non-pharmacologic comfort measures Outcome: Progressing   Problem: Health Behavior/Discharge Planning: Goal: Ability to manage health-related needs will improve Outcome: Progressing   

## 2019-09-29 NOTE — Progress Notes (Signed)
Patient has noted slight facial droop to right side of face. Smile is symmetrical and no other noted neurological issues. Per PA and MD these are probably baseline. Patient states her only concern is her eyes hurt. Applied cool wash cloth and given daily BP medication to help with pressure relief. Patient rested and ate some oatmeal and drank coffee. Patient pressures which normal range. Daughter at bedisde. Pt resting with call bell within reach.  Will continue to monitor.

## 2019-09-29 NOTE — Anesthesia Postprocedure Evaluation (Signed)
Anesthesia Post Note  Patient: Ariel Lambert  Procedure(s) Performed: REDO OF RIGHT ENDARTERECTOMY CAROTID (Right Neck)     Patient location during evaluation: PACU Anesthesia Type: General Level of consciousness: awake Pain management: pain level controlled Vital Signs Assessment: post-procedure vital signs reviewed and stable Respiratory status: spontaneous breathing, nonlabored ventilation, respiratory function stable and patient connected to nasal cannula oxygen Cardiovascular status: blood pressure returned to baseline and stable Postop Assessment: no apparent nausea or vomiting Anesthetic complications: no    Last Vitals:  Vitals:   09/29/19 1406 09/29/19 1441  BP: (!) 141/67 (!) 173/97  Pulse: 74 78  Resp: 10   Temp:  36.7 C  SpO2: 98% 94%    Last Pain:  Vitals:   09/29/19 1441  TempSrc: Oral  PainSc:                  Iola Turri P Atiyah Bauer

## 2019-09-29 NOTE — Anesthesia Procedure Notes (Signed)
Procedure Name: Intubation Date/Time: 09/29/2019 7:51 AM Performed by: Barrington Ellison, CRNA Pre-anesthesia Checklist: Patient identified, Emergency Drugs available, Suction available and Patient being monitored Patient Re-evaluated:Patient Re-evaluated prior to induction Oxygen Delivery Method: Circle System Utilized Preoxygenation: Pre-oxygenation with 100% oxygen Induction Type: IV induction Ventilation: Mask ventilation without difficulty Laryngoscope Size: Mac and 3 Grade View: Grade I Tube type: Oral Tube size: 7.0 mm Number of attempts: 1 Airway Equipment and Method: Stylet and Oral airway Placement Confirmation: ETT inserted through vocal cords under direct vision,  positive ETCO2 and breath sounds checked- equal and bilateral Secured at: 21 cm Tube secured with: Tape Dental Injury: Teeth and Oropharynx as per pre-operative assessment  Comments: Intubated by Paulina Fusi, SRNA

## 2019-09-29 NOTE — Anesthesia Procedure Notes (Signed)
Arterial Line Insertion Start/End1/14/2021 7:10 AM, 09/29/2019 7:25 AM Performed by: De Nurse, CRNA, CRNA  Patient location: Pre-op. Preanesthetic checklist: patient identified and pre-op evaluation Lidocaine 1% used for infiltration Left, radial was placed Catheter size: 20 G Hand hygiene performed  and maximum sterile barriers used   Attempts: 3 Procedure performed without using ultrasound guided technique. Following insertion, dressing applied and Biopatch. Post procedure assessment: normal  Patient tolerated the procedure well with no immediate complications.

## 2019-09-29 NOTE — Op Note (Signed)
Patient name: Ariel Lambert MRN: [id number] DOB: 19-Dec-1935 Sex: female  09/29/2019 Pre-operative Diagnosis: Right carotid artery pseudoaneurysm Post-operative diagnosis:  Same Surgeon:  Erlene Quan C. Donzetta Matters, MD Assistant: Risa Grill, PA Procedure Performed: 1.  Redo exposure right carotid greater than 30 days 2.  Right carotid endarterectomy with repair of patch pseudoaneurysm with bovine pericardial patch   Indications: 84 year old female with history of symptomatic right ICA stenosis underwent carotid endarterectomy with Dacron patch angioplasty in 2001.  More recently she was noted to have a neck mass this was found to be hypervascular and noted on CT scan to be patch disruption with pseudoaneurysm proximally.  She is now indicated for repair.  There is some recurrent calcification notable within the patch.  Findings: Pseudoaneurysm was densely adherent to the sternocleidomastoid, IJ vein, vagus nerve.  Vagus was protected but significant manipulation was required.  Hypoglossal nerve was identified and protected.  Pseudoaneurysmal degeneration of the patch was proximal.  This was thrombus laden there was also hyperplastic tissue extending into the carotid artery itself.  There was significant calcification in repeat carotid endarterectomy was performed.  Distally we did tack the flap with 7-0 Prolene suture.  10 [place] shunt was used although there was good backbleeding both prior to and after its placement.  At completion there were expected signals in the ICA distal as well as the external carotid arteries.   Procedure:  The patient was identified in the holding area and taken to the operating room where she is placed supine on the operative table and general anesthesia was induced.  She was sterilely prepped and draped in the neck and chest in usual fashion antibiotics were administered and a timeout was called.  Her previous incision along the anterior border the  sternocleidomastoid was opened.  We dissected through the skin subcutaneous tissue down to the sternocleidomastoid muscle.  We almost immediately identified the pseudoaneurysm as it was densely here into the sternocleidomastoid.  We dissected this with cautery.  We then identified that it was densely here into the internal jugular vein.  We dissected further down into the incision.  We identified the omohyoid muscle and this was divided.  There we identified the vagus nerve this was protected.  Proximally the IJ and common carotid artery were easily separated and an umbilical tape was placed around the common carotid artery.  We then dissected up onto the pseudoaneurysm itself.  Meticulous dissection was used to take the IJ off of the pseudoaneurysm both with cautery and scissors.  There was one area that was injured on the IJ and this was repaired with 5-0 Prolene suture in figure-of-eight fashion.  We identified the vagus nerve that was densely adherent.  At this point I elected to dissected higher up onto the external carotid artery and placed a vessel loop around this as well as the superior thyroidal branch.  At this time identified the hypoglossal nerve this was much higher than our patch.  We dissected up onto the internal were able to get a vessel loop around this as well.  We then tediously dissected the vagus away from the internal carotid artery down onto the pseudoaneurysm until they were free.  At this time the patient was fully heparinized.  ACT was checked was in the 230 range I then redosed heparin.  I prepared a 10 [place] shunt.  I clamped the internal followed by the common and external carotid arteries.  I opened first the pseudoaneurysm where there was significant gelatinous  material.  I opened this down onto healthy-appearing common carotid artery.  We then extended this up the internal carotid artery.  I removed all the gelatinous material as well as the hyperplastic tissue in the carotid bulb.   I had adequate backbleeding from the internal carotid artery.  I placed the shunt distally confirmed backbleeding placed approximately.  I confirmed flow with Doppler.  We then proceeded with endarterectomy starting proximally and extending up into the external where we performed eversion.  Towards the top of our dissection of the ICA elected to transect the plaque.  I removed this is 1 entire piece.  I also remove the entire existing dacryon patch.  I tacked the plaque distally with interrupted 7-0 Prolene suture.  I thoroughly irrigated with heparinized saline.  A bovine pericardial patch was brought to the field trimmed to size and sewn in place with 6-0 Prolene suture.  Prior to completion of anastomosis we then removed our shunt.  I allowed backbleeding and forward bleeding.  I thoroughly irrigated with heparinized saline.  I then completed the suture line followed by removal of the external carotid artery clamp followed by common carotid artery clamp.  After several cardiac cycles are remove the internal.  There were good signals in the internal and external.  50 mg of protamine was administered.  We obtain hemostasis in the wound and thoroughly irrigated.  Patient was awake from anesthesia.  She was noted to be neurologically intact was transferred to the recovery room in stable condition.  All counts were correct at completion.  EBL: 100 cc  Specimen: carotid pseudoaneurysm for culture   Cheryln Balcom C. Randie Heinz, MD [facility] [place] Office: [phone] Pager: [phone]

## 2019-09-30 LAB — BASIC METABOLIC PANEL
Anion gap: 9 (ref 5–15)
BUN: 10 mg/dL (ref 8–23)
CO2: 22 mmol/L (ref 22–32)
Calcium: 8 mg/dL — ABNORMAL LOW (ref 8.9–10.3)
Chloride: 103 mmol/L (ref 98–111)
Creatinine, Ser: 0.52 mg/dL (ref 0.44–1.00)
GFR calc Af Amer: >60 mL/min (ref >60)
GFR calc non Af Amer: >60 mL/min (ref >60)
Glucose, Bld: 124 mg/dL — ABNORMAL HIGH (ref 70–99)
Potassium: 3.8 mmol/L (ref 3.5–5.1)
Sodium: 134 mmol/L — ABNORMAL LOW (ref 135–145)

## 2019-09-30 LAB — CBC
HCT: 35.1 % — ABNORMAL LOW (ref 36.0–46.0)
Hemoglobin: 11.2 g/dL — ABNORMAL LOW (ref 12.0–15.0)
MCH: 32.4 pg (ref 26.0–34.0)
MCHC: 31.9 g/dL (ref 30.0–36.0)
MCV: 101.4 fL — ABNORMAL HIGH (ref 80.0–100.0)
Platelets: 190 10*3/uL (ref 150–400)
RBC: 3.46 MIL/uL — ABNORMAL LOW (ref 3.87–5.11)
RDW: 14.2 % (ref 11.5–15.5)
WBC: 7.6 10*3/uL (ref 4.0–10.5)
nRBC: 0 % (ref 0.0–0.2)

## 2019-09-30 MED ORDER — ASPIRIN 81 MG PO TBEC: 81.0000 mg | DELAYED_RELEASE_TABLET | Freq: Every day | ORAL | Status: DC

## 2019-09-30 MED ORDER — TRAMADOL HCL 50 MG PO TABS: 50.0000 mg | ORAL_TABLET | Freq: Four times a day (QID) | ORAL | 0 refills | Status: DC | PRN

## 2019-09-30 MED ORDER — SIMVASTATIN 20 MG PO TABS
20.0000 mg | ORAL_TABLET | Freq: Every day | ORAL | Status: DC
  Administered 2019-09-30: 20 mg via ORAL
  Filled 2019-09-30: qty 1

## 2019-09-30 MED ORDER — OXYCODONE-ACETAMINOPHEN 5-325 MG PO TABS: 1.0000 | ORAL_TABLET | Freq: Four times a day (QID) | ORAL | 0 refills | Status: DC | PRN

## 2019-09-30 MED ORDER — ONDANSETRON HCL 4 MG PO TABS
4.0000 mg | ORAL_TABLET | Freq: Once | ORAL | Status: AC
  Administered 2019-09-30: 4 mg via ORAL
  Filled 2019-09-30: qty 1

## 2019-09-30 MED ORDER — EZETIMIBE 10 MG PO TABS
10.0000 mg | ORAL_TABLET | Freq: Every day | ORAL | Status: DC
  Administered 2019-09-30: 10 mg via ORAL
  Filled 2019-09-30: qty 1

## 2019-09-30 NOTE — Discharge Instructions (Signed)
   [facility] [place]  Discharge Instructions   Carotid Endarterectomy (CEA)  Please refer to the following instructions for your post-procedure care. Your surgeon or physician assistant will discuss any changes with you.  Activity  You are encouraged to walk as much as you can. You can slowly return to normal activities but must avoid strenuous activity and heavy lifting until your doctor tell you it's OK. Avoid activities such as vacuuming or swinging a golf club. You can drive after one week if you are comfortable and you are no longer taking prescription pain medications. It is normal to feel tired for serval weeks after your surgery. It is also normal to have difficulty with sleep habits, eating, and bowel movements after surgery. These will go away with time.  Bathing/Showering  You may shower after you come home. Do not soak in a bathtub, hot tub, or swim until the incision heals completely.  Incision Care  Shower every day. Clean your incision with mild soap and water. Pat the area dry with a clean towel. You do not need a bandage unless otherwise instructed. Do not apply any ointments or creams to your incision. You may have skin glue on your incision. Do not peel it off. It will come off on its own in about one week. Your incision may feel thickened and raised for several weeks after your surgery. This is normal and the skin will soften over time. For Men Only: It's OK to shave around the incision but do not shave the incision itself for 2 weeks. It is common to have numbness under your chin that could last for several months.  Diet  Resume your normal diet. There are no special food restrictions following this procedure. A low fat/low cholesterol diet is recommended for all patients with vascular disease. In order to heal from your surgery, it is CRITICAL to get adequate nutrition. Your body requires vitamins, minerals, and protein. Vegetables are the best  source of vitamins and minerals. Vegetables also provide the perfect balance of protein. Processed food has little nutritional value, so try to avoid this.        Medications  Resume taking all of your medications unless your doctor or physician assistant tells you not to. If your incision is causing pain, you may take over-the- counter pain relievers such as acetaminophen (Tylenol). If you were prescribed a stronger pain medication, please be aware these medications can cause nausea and constipation. Prevent nausea by taking the medication with a snack or meal. Avoid constipation by drinking plenty of fluids and eating foods with a high amount of fiber, such as fruits, vegetables, and grains. Do not take Tylenol if you are taking prescription pain medications.  Follow Up  Our office will schedule a follow up appointment 2-3 weeks following discharge.  Please call us immediately for any of the following conditions  Increased pain, redness, drainage (pus) from your incision site. Fever of 101 degrees or higher. If you should develop stroke (slurred speech, difficulty swallowing, weakness on one side of your body, loss of vision) you should call 911 and go to the nearest emergency room.  Reduce your risk of vascular disease:  Stop smoking. If you would like help call [facility] at 1-800-QUIT-NOW (1-800-784-8669) or [place] at [phone]. Manage your cholesterol Maintain a desired weight Control your diabetes Keep your blood pressure down  If you have any questions, please call the office at [phone].   

## 2019-09-30 NOTE — Progress Notes (Signed)
  Progress Note    09/30/2019 9:49 AM 1 Day Post-Op  Subjective: Complains of mild headache this morning  Vitals:   09/30/19 0536 09/30/19 0752  BP: 111/65 (!) 150/85  Pulse: 67 70  Resp: 16 18  Temp:  97.6 F (36.4 C)  SpO2: 92% 98%    Physical Exam: Awake alert oriented Tongue is midline Smile symmetric Right neck incision healing well incision clean dry intact Moving upper and lower extremities without limitation  CBC    Component Value Date/Time   WBC 7.6 09/30/2019 0430   RBC 3.46 (L) 09/30/2019 0430   HGB 11.2 (L) 09/30/2019 0430   HCT 35.1 (L) 09/30/2019 0430   PLT 190 09/30/2019 0430   MCV 101.4 (H) 09/30/2019 0430   MCH 32.4 09/30/2019 0430   MCHC 31.9 09/30/2019 0430   RDW 14.2 09/30/2019 0430   LYMPHSABS 1.1 09/20/2017 1509   MONOABS 0.5 09/20/2017 1509   EOSABS 0.1 09/20/2017 1509   BASOSABS 0.0 09/20/2017 1509    BMET    Component Value Date/Time   NA 134 (L) 09/30/2019 0430   K 3.8 09/30/2019 0430   CL 103 09/30/2019 0430   CO2 22 09/30/2019 0430   GLUCOSE 124 (H) 09/30/2019 0430   BUN 10 09/30/2019 0430   CREATININE 0.52 09/30/2019 0430   CALCIUM 8.0 (L) 09/30/2019 0430   GFRNONAA >60 09/30/2019 0430   GFRAA >60 09/30/2019 0430    INR    Component Value Date/Time   INR 1.0 09/22/2019 1136     Intake/Output Summary (Last 24 hours) at 09/30/2019 0949 Last data filed at 09/30/2019 0353 Gross per 24 hour  Intake 954.98 ml  Output 1050 ml  Net -95.02 ml     Assessment/plan:  84 y.o. female is s/p redo right carotid endarterectomy with repair of pseudoaneurysm.  Did have eye pain yesterday which has resolved.  No visual changes.  I do not appreciate any facial droop and her smile has been symmetric.  She does have a slight headache this morning.  No swallowing issues.  We will watch her this morning possibly discharge later today.    Chaden Doom C. Randie Heinz, MD [facility] [place] Office: [phone] Pager:  [phone]  09/30/2019 9:49 AM

## 2019-09-30 NOTE — Progress Notes (Signed)
Pt discharged today to home with family.  Pt taken off telemetry and CCMD notified.  Pt's IV's removed.  Pt left with all of their personal belongings.  AVS documentation reviewed with patient and daughter and all questions were answered.

## 2019-09-30 NOTE — Discharge Summary (Signed)
Discharge Summary     Ariel Lambert May 23, 1936 84 y.o. 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 Donzetta Matters.  The pt tolerated the procedure well and was transported to the PACU in good condition.   By POD 1, the pt neuro status remained at baseline.  She did have some headache in the morning however this resolved by the afternoon.  She will follow-up in office in about 2 to 3 weeks with Dr. Donzetta Matters.  She will be prescribed 1 to 2 days of narcotic pain medication for continued postoperative pain control.  Discharge instructions were reviewed with the patient and she voices her understanding.  She will be discharged home this afternoon in stable condition.   Recent Labs    09/30/19 0430  NA 134*  K 3.8  CL 103  CO2 22  GLUCOSE 124*  BUN 10  CALCIUM 8.0*   Recent Labs    09/30/19 0430  WBC 7.6  HGB 11.2*  HCT 35.1*  PLT 190   No results for input(s): INR in the last 72 hours.     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/29/2019  . Neck nodule 06/22/2019  . COPD exacerbation ([place]) 09/02/2015  . Asthma exacerbation 09/02/2015  . Allergic rhinitis 03/16/2013  . 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 ASTHMA 05/03/2009  . HYPOKALEMIA 06/13/2008  . Thyrotoxicosis  06/17/2007  . Essential hypertension 04/30/2007  . OSTEOPOROSIS 04/30/2007   Past Medical History:  Diagnosis Date  . Allergy   . Arthritis   . ASTHMA 05/03/2009  . Asthma   . Chronic kidney disease    " in remission"  . CVA (cerebral infarction)   . Dyslipidemia   . Headache   . Hearing aid worn    B/L  . HYPERTENSION 04/30/2007  . HYPERTHYROIDISM 06/17/2007  . Menopause   . Multinodular goiter   . OSTEOPOROSIS 04/30/2007  . Wears glasses     Allergies as of 09/30/2019      Reactions   Cetirizine & Related Other (See Comments)   Stomach pains   Codeine Itching   Severe itching   Meperidine Hcl Diarrhea, Nausea And Vomiting   (Demerol)   Nabumetone Rash   (Relafen)   Naproxen Itching, Rash   Sulfonamide Derivatives Rash      Medication List    TAKE these medications   albuterol 108 (90 Base) MCG/ACT inhaler Commonly known as: VENTOLIN HFA Inhale 2 puffs into the lungs every 6 (six) hours as needed. What changed: reasons to take this   alendronate 70 MG tablet Commonly known as: FOSAMAX Take 70 mg by mouth every Friday. Take with a full glass of water on an empty stomach.   aspirin 81 MG EC tablet Take 1 tablet (81 mg total) by mouth daily at 6 (six)  AM. Start taking on: October 01, 2019   aspirin-acetaminophen-caffeine 250-250-65 MG tablet Commonly known as: EXCEDRIN MIGRAINE Take 1-2 tablets by mouth 2 (two) times daily as needed for headache.   azelastine 0.1 % nasal spray Commonly known as: ASTELIN Place 1 spray into both nostrils at bedtime. Use in each nostril as directed   b complex vitamins tablet Take 1 tablet by mouth daily.   BIOTIN PO Take 1 tablet by mouth daily.   CALCIUM 500 + D PO Take 1 tablet by mouth 2 (two) times daily.   ezetimibe-simvastatin 10-20 MG tablet Commonly known as: VYTORIN Take 1 tablet by mouth daily after breakfast.   fluticasone 50 MCG/ACT nasal spray Commonly known as: FLONASE Place 1 spray into both nostrils  daily.   ipratropium 0.03 % nasal spray Commonly known as: ATROVENT Place 2 sprays into both nostrils 2 (two) times daily as needed for rhinitis.   levalbuterol 0.63 MG/3ML nebulizer solution Commonly known as: XOPENEX Take 3 mLs (0.63 mg total) by nebulization every 4 (four) hours. What changed:   when to take this  reasons to take this   levocetirizine 5 MG tablet Commonly known as: XYZAL Take 5 mg by mouth at bedtime.   methimazole 10 MG tablet Commonly known as: TAPAZOLE Take 5-10 mg by mouth See admin instructions. Take 1 tablet (10 mg) by mouth every other day; Take 0.5 tablet (5 mg) by mouth every other day (alternate between 5 mg & 10 mg)   methimazole 5 MG tablet Commonly known as: TAPAZOLE Take qd (alternate 1/2 and 1 pill)   montelukast 10 MG tablet Commonly known as: SINGULAIR Take 10 mg by mouth at bedtime.   multivitamin with minerals Tabs tablet Take 1 tablet by mouth daily.   Myrbetriq 50 MG Tb24 tablet Generic drug: mirabegron ER Take 50 mg by mouth daily.   Symbicort 160-4.5 MCG/ACT inhaler Generic drug: budesonide-formoterol Inhale 2 puffs into the lungs 2 (two) times daily.   telmisartan 80 MG tablet Commonly known as: MICARDIS Take 40 mg by mouth 2 (two) times daily.   traMADol 50 MG tablet Commonly known as: Ultram Take 1 tablet (50 mg total) by mouth every 6 (six) hours as needed.        Discharge Instructions:   [facility] Discharge Instructions Carotid Endarterectomy (CEA)  Please refer to the following instructions for your post-procedure care. Your surgeon or physician assistant will discuss any changes with you.  Activity  You are encouraged to walk as much as you can. You can slowly return to normal activities but must avoid strenuous activity and heavy lifting until your doctor tell you it's OK. Avoid activities such as vacuuming or swinging a golf club. You can drive after one week if you  are comfortable and you are no longer taking prescription pain medications. It is normal to feel tired for serval weeks after your surgery. It is also normal to have difficulty with sleep habits, eating, and bowel movements after surgery. These will go away with time.  Bathing/Showering  You may shower after you come home. Do not soak in a bathtub, hot tub, or swim until the incision heals completely.  Incision Care  Shower every day. Clean your incision with mild soap and water. Pat the area dry with a clean towel. You do not need a bandage unless otherwise instructed. Do not apply any ointments or creams to your incision. You may have skin glue on your incision. Do not  peel it off. It will come off on its own in about one week. Your incision may feel thickened and raised for several weeks after your surgery. This is normal and the skin will soften over time. For Men Only: It's OK to shave around the incision but do not shave the incision itself for 2 weeks. It is common to have numbness under your chin that could last for several months.  Diet  Resume your normal diet. There are no special food restrictions following this procedure. A low fat/low cholesterol diet is recommended for all patients with vascular disease. In order to heal from your surgery, it is CRITICAL to get adequate nutrition. Your body requires vitamins, minerals, and protein. Vegetables are the best source of vitamins and minerals. Vegetables also provide the perfect balance of protein. Processed food has little nutritional value, so try to avoid this.  Medications  Resume taking all of your medications unless your doctor or physician assistant tells you not to.  If your incision is causing pain, you may take over-the- counter pain relievers such as acetaminophen (Tylenol). If you were prescribed a stronger pain medication, please be aware these medications can cause nausea and constipation.  Prevent nausea by taking the  medication with a snack or meal. Avoid constipation by drinking plenty of fluids and eating foods with a high amount of fiber, such as fruits, vegetables, and grains. Do not take Tylenol if you are taking prescription pain medications.  Follow Up  Our office will schedule a follow up appointment 2-3 weeks following discharge.  Please call us immediately for any of the following conditions  . Increased pain, redness, drainage (pus) from your incision site. . Fever of 101 degrees or higher. . If you should develop stroke (slurred speech, difficulty swallowing, weakness on one side of your body, loss of vision) you should call 911 and go to the nearest emergency room. .  Reduce your risk of vascular disease:  . Stop smoking. If you would like help call [facility] at 1-800-QUIT-NOW (938-563-3831) or [place] at 6672759501. . Manage your cholesterol . Maintain a desired weight . Control your diabetes . Keep your blood pressure down .  If you have any questions, please call the office at [phone].  Prescriptions given: Tramadol 50mg  q6h #10  Disposition: home  Patient's condition: is Good  Follow up: 1. Dr. in 2-3 weeks.   Randie Heinz, PA-C [facility] [phone]   --- For [facility] Registry use ---   Modified Rankin score at D/C (0-6): 0  IV medication needed for:  1. Hypertension: No 2. Hypotension: No  Post-op Complications: No  1. Post-op CVA or TIA: No  If yes: Event classification (right eye, left eye, right cortical, left cortical, verterobasilar, other):   If yes: Timing of event (intra-op, <6 hrs post-op, >=6 hrs post-op, unknown):   2. CN injury: No   3. Myocardial infarction: No  If yes: Dx by (EKG or clinical, Troponin):   4.  CHF: No  5.  Dysrhythmia (new): No  6. Wound infection: No  7. Reperfusion symptoms: No  8. Return to OR: No  If yes: return to OR for (bleeding, neurologic, other CEA incision,  other):

## 2019-10-01 ENCOUNTER — Other Ambulatory Visit: Payer: Self-pay | Admitting: Vascular Surgery

## 2019-10-01 MED ORDER — ONDANSETRON HCL 4 MG PO TABS: 4.0000 mg | ORAL_TABLET | Freq: Three times a day (TID) | ORAL | 0 refills | Status: DC | PRN

## 2019-10-04 LAB — AEROBIC/ANAEROBIC CULTURE W GRAM STAIN (SURGICAL/DEEP WOUND): Culture: NO GROWTH

## 2019-10-06 ENCOUNTER — Other Ambulatory Visit: Payer: Self-pay | Admitting: Endocrinology

## 2019-10-21 ENCOUNTER — Encounter: Payer: Self-pay | Admitting: Vascular Surgery

## 2019-10-21 ENCOUNTER — Other Ambulatory Visit: Payer: Self-pay

## 2019-10-21 ENCOUNTER — Ambulatory Visit (INDEPENDENT_AMBULATORY_CARE_PROVIDER_SITE_OTHER): Payer: Self-pay | Admitting: Vascular Surgery

## 2019-10-21 VITALS — BP 155/86 | HR 67 | Temp 97.2°F | Resp 20 | Ht 63.0 in | Wt 134.0 lb

## 2019-10-21 DIAGNOSIS — I720 Aneurysm of carotid artery: Secondary | ICD-10-CM

## 2019-10-21 NOTE — Progress Notes (Signed)
    Subjective:     Patient ID: Ariel Lambert, female   DOB: 02/28/1936, 84 y.o.   MRN: [id number]  HPI 84 year old female follows up after repair of right common femoral patch pseudoaneurysm.  She also had endarterectomy.  Since this time she is done very well.  She continues on aspirin and Vytorin.  Patient is hopeful she can drive soon.   Review of Systems No issues    Objective:   Physical Exam Vitals:   10/21/19 0822 10/21/19 0826  BP: (!) 147/84 (!) 155/86  Pulse: 67   Resp: 20   Temp: (!) 97.2 F (36.2 C)   SpO2: 96%    Awake alert oriented Neurologically intact Incision healing well still has Dermabond in place    Assessment:     84 year old female status post right common carotid patch pseudoaneurysm with carotid endarterectomy.    Plan:     Continue aspirin and statin Okay for activity is normal Follow-up 9 months with repeat carotid duplex.    Benjie Ricketson C. Randie Heinz, MD [facility] [place] Office: [phone] Pager: [phone]

## 2019-10-24 ENCOUNTER — Other Ambulatory Visit: Payer: Self-pay | Admitting: *Deleted

## 2019-10-24 DIAGNOSIS — I720 Aneurysm of carotid artery: Secondary | ICD-10-CM

## 2019-10-24 DIAGNOSIS — I6529 Occlusion and stenosis of unspecified carotid artery: Secondary | ICD-10-CM

## 2019-12-13 ENCOUNTER — Other Ambulatory Visit: Payer: Self-pay | Admitting: Family Medicine

## 2019-12-13 DIAGNOSIS — M810 Age-related osteoporosis without current pathological fracture: Secondary | ICD-10-CM

## 2019-12-21 ENCOUNTER — Ambulatory Visit: Payer: Medicare Other | Admitting: Endocrinology

## 2019-12-27 ENCOUNTER — Ambulatory Visit: Payer: Medicare PPO | Admitting: Endocrinology

## 2020-01-03 ENCOUNTER — Other Ambulatory Visit: Payer: Self-pay

## 2020-01-03 ENCOUNTER — Ambulatory Visit
Admission: RE | Admit: 2020-01-03 | Discharge: 2020-01-03 | Disposition: A | Discharge status: Home / Self Care | Payer: Medicare PPO | Source: Ambulatory Visit | Attending: Family Medicine | Admitting: Family Medicine

## 2020-01-03 DIAGNOSIS — M810 Age-related osteoporosis without current pathological fracture: Secondary | ICD-10-CM

## 2020-01-04 ENCOUNTER — Ambulatory Visit: Payer: Medicare PPO | Admitting: Internal Medicine

## 2020-01-04 ENCOUNTER — Encounter: Payer: Self-pay | Admitting: Internal Medicine

## 2020-01-04 ENCOUNTER — Other Ambulatory Visit: Payer: Self-pay

## 2020-01-04 VITALS — BP 132/78 | HR 70 | Temp 96.8°F | Ht 63.0 in | Wt 136.0 lb

## 2020-01-04 DIAGNOSIS — R2689 Other abnormalities of gait and mobility: Secondary | ICD-10-CM

## 2020-01-04 DIAGNOSIS — I10 Essential (primary) hypertension: Secondary | ICD-10-CM | POA: Diagnosis not present

## 2020-01-04 DIAGNOSIS — I671 Cerebral aneurysm, nonruptured: Secondary | ICD-10-CM | POA: Diagnosis not present

## 2020-01-04 NOTE — Progress Notes (Signed)
Cardiology Office Note:    Date:  01/04/2020   ID:  Ariel Lambert, DOB 1936/06/15, MRN [id number]  PCP:  Jonathon Jordan, MD  Cardiologist:  Elouise Munroe, MD  Electrophysiologist:  None   Referring MD: Jonathon Jordan, MD   Chief Complaint: post op carotid surgery  History of Present Illness:    Ariel Lambert is a 84 y.o. female with a history of bilateral carotid endarterectomy in 2001. I last saw her in January 2021 for preoperative evaluation prior to repair of pseudoaneurysm off the inferior aspect of the previous patch angioplasty of the carotid endarterectomy on the right.  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  She feels overall well but then tells me she has had balance issues since surgery.  She used to be able to walk the dog without assistance, but now needs to use a cane and finds it to be a bit of a challenge.  I have repeatedly asked the patient whether she feels this is a strength issue or a balance issue.  She tells me she feels it is more a balance issue but she uses her cane for "strength and support".  She does not feel lightheaded or dizzy.  She denies chest pain, shortness of breath, palpitations, syncope.  Past Medical History:  Diagnosis Date  . Allergy   . Arthritis   . ASTHMA 05/03/2009  . Asthma   . Chronic kidney disease    " in remission"  . CVA (cerebral infarction)   . Dyslipidemia   . Headache   . Hearing aid worn    B/L  . HYPERTENSION 04/30/2007  . HYPERTHYROIDISM 06/17/2007  . Menopause   . Multinodular goiter   . OSTEOPOROSIS 04/30/2007  . Wears glasses     Past Surgical History:  Procedure Laterality Date  . BACK SURGERY    . BREAST EXCISIONAL BIOPSY Left   . carotid artery surgery    . CATARACT EXTRACTION W/ INTRAOCULAR LENS  IMPLANT, BILATERAL    . ENDARTERECTOMY Right 09/29/2019   Procedure: REDO OF RIGHT ENDARTERECTOMY  CAROTID;  Surgeon: Waynetta Sandy, MD;  Location: [place];  Service: Vascular;  Laterality: Right;  . NECK SURGERY    . OTHER SURGICAL HISTORY     discectomy    Current Medications: Current Meds  Medication Sig  . albuterol (PROVENTIL HFA;VENTOLIN HFA) 108 (90 BASE) MCG/ACT inhaler Inhale 2 puffs into the lungs every 6 (six) hours as needed. (Patient taking differently: Inhale 2 puffs into the lungs every 6 (six) hours as needed for wheezing or shortness of breath. )  . alendronate (FOSAMAX) 70 MG tablet Take 70 mg by mouth every Friday. Take with a full glass of water on an empty stomach.   Marland Kitchen aspirin EC 81 MG EC tablet Take 1 tablet (81 mg total) by mouth daily at 6 (six) AM.  . azelastine (ASTELIN) 0.1 % nasal spray Place 1 spray into both nostrils at bedtime. Use in each nostril as directed  . b complex vitamins tablet Take 1 tablet by mouth daily.    Marland Kitchen BIOTIN PO Take 1 tablet by mouth daily.  . Calcium Carbonate-Vitamin D (CALCIUM 500 + D PO) Take 1 tablet by mouth 2 (two) times daily.   Marland Kitchen ezetimibe-simvastatin (VYTORIN) 10-20 MG per tablet Take 1 tablet by mouth daily after breakfast.   . fluticasone (FLONASE) 50 MCG/ACT nasal spray Place 1 spray into both nostrils  daily.  . ipratropium (ATROVENT) 0.03 % nasal spray Place 2 sprays into both nostrils 2 (two) times daily as needed for rhinitis.  Marland Kitchen levalbuterol (XOPENEX) 0.63 MG/3ML nebulizer solution Take 3 mLs (0.63 mg total) by nebulization every 4 (four) hours. (Patient taking differently: Take 0.63 mg by nebulization every 6 (six) hours as needed for wheezing or shortness of breath. )  . levocetirizine (XYZAL) 5 MG tablet Take 5 mg by mouth at bedtime.  . methimazole (TAPAZOLE) 5 MG tablet Take qd (alternate 1/2 and 1 pill)  . mirabegron ER (MYRBETRIQ) 50 MG TB24 tablet Take 50 mg by mouth daily.  . montelukast (SINGULAIR) 10 MG tablet Take 10 mg by mouth at bedtime.  . Multiple Vitamin (MULTIVITAMIN WITH MINERALS) TABS tablet  Take 1 tablet by mouth daily.  . ondansetron (ZOFRAN) 4 MG tablet Take 1 tablet (4 mg total) by mouth every 8 (eight) hours as needed for nausea or vomiting.  . SYMBICORT 160-4.5 MCG/ACT inhaler Inhale 2 puffs into the lungs 2 (two) times daily.  Marland Kitchen telmisartan (MICARDIS) 80 MG tablet Take 40 mg by mouth 2 (two) times daily.     Allergies:   Cetirizine & related, Codeine, Meperidine hcl, Nabumetone, Naproxen, and Sulfonamide derivatives   Social History   Socioeconomic History  . Marital status: Widowed    Spouse name: Not on file  . Number of children: Not on file  . Years of education: Not on file  . Highest education level: Not on file  Occupational History  . Occupation: Magazine features editor: RETIRED  Tobacco Use  . Smoking status: Former Smoker    Types: Cigarettes  . Smokeless tobacco: Never Used  . Tobacco comment: "Quit smoking cigarettes in my 40's "  Substance and Sexual Activity  . Alcohol use: Yes    Alcohol/week: 0.0 standard drinks    Comment: Socially  . Drug use: No  . Sexual activity: Not on file  Other Topics Concern  . Not on file  Social History Narrative   Lives alone.   Social Determinants of Health   Financial Resource Strain:   . Difficulty of Paying Living Expenses:   Food Insecurity:   . Worried About Programme researcher, broadcasting/film/video in the Last Year:   . Barista in the Last Year:   Transportation Needs:   . Freight forwarder (Medical):   Marland Kitchen Lack of Transportation (Non-Medical):   Physical Activity:   . Days of Exercise per Week:   . Minutes of Exercise per Session:   Stress:   . Feeling of Stress :   Social Connections:   . Frequency of Communication with Friends and Family:   . Frequency of Social Gatherings with Friends and Family:   . Attends Religious Services:   . Active Member of Clubs or Organizations:   . Attends Banker Meetings:   Marland Kitchen Marital Status:      Family History: The patient's family history includes  Breast cancer (age of onset: 70) in her mother; Heart disease in an other family member.  ROS:   Please see the history of present illness.    All other systems reviewed and are negative.  EKGs/Labs/Other Studies Reviewed:    The following studies were reviewed today:  EKG:  n/a  Recent Labs: 06/22/2019: TSH 3.45 09/22/2019: ALT 15 09/30/2019: BUN 10; Creatinine, Ser 0.52; Hemoglobin 11.2; Platelets 190; Potassium 3.8; Sodium 134  Recent Lipid Panel No results found for: CHOL, TRIG, HDL,  CHOLHDL, VLDL, LDLCALC, LDLDIRECT  Physical Exam:    VS:  BP 132/78 (BP Location: Left Arm, Patient Position: Sitting, Cuff Size: Normal)   Pulse 70   Temp (!) 96.8 F (36 C)   Ht 5\' 3"  (1.6 m)   Wt 136 lb (61.7 kg)   BMI 24.09 kg/m     Wt Readings from Last 5 Encounters:  01/04/20 136 lb (61.7 kg)  10/21/19 134 lb (60.8 kg)  09/29/19 132 lb (59.9 kg)  09/22/19 135 lb 2 oz (61.3 kg)  09/21/19 134 lb 12.8 oz (61.1 kg)     Constitutional: No acute distress Eyes: sclera non-icteric, normal conjunctiva and lids ENMT: mask in place Cardiovascular: regular rhythm, normal rate, no murmurs. S1 and S2 normal. No jugular venous distention.  Respiratory: clear to auscultation bilaterally GI : normal bowel sounds, soft and nontender. No distention.   MSK: extremities warm, well perfused. No edema.  NEURO: grossly nonfocal exam, moves all extremities.  Needs some assistance standing from the exam table, but otherwise stands and ambulates independently. PSYCH: alert and oriented x 3, normal mood and affect.   ASSESSMENT:    1. Right internal carotid artery aneurysm   2. Essential hypertension   3. Balance problem    PLAN:    Right internal carotid artery aneurysm s/p repair.  - concerned with balance issues postoperatively. I have recommended that she contact Dr. 11/19/19 with questions.  -continue ASA and statin  Essential hypertension - continue telmisartan,    Total time of encounter: 20  minutes total time of encounter, including 15 minutes spent in face-to-face patient care on the date of this encounter. This time includes coordination of care and counseling regarding above mentioned problem list. Remainder of non-face-to-face time involved reviewing chart documents/testing relevant to the patient encounter and documentation in the medical record. I have independently reviewed documentation from referring provider.   Randie Heinz, MD [place]  [facility]    Medication Adjustments/Labs and Tests Ordered: Current medicines are reviewed at length with the patient today.  Concerns regarding medicines are outlined above.  No orders of the defined types were placed in this encounter.  No orders of the defined types were placed in this encounter.   There are no Patient Instructions on file for this visit.

## 2020-01-04 NOTE — Patient Instructions (Signed)
Medication Instructions:  CONTINUE WITH CURRENT MEDICATIONS. NO CHANGES.  *If you need a refill on your cardiac medications before your next appointment, please call your pharmacy*   Follow-Up: At [facility], you and your health needs are our priority.  As part of our continuing mission to provide you with exceptional heart care, we have created designated Provider Care Teams.  These Care Teams include your primary Cardiologist (physician) and Advanced Practice Providers (APPs -  Physician Assistants and Nurse Practitioners) who all work together to provide you with the care you need, when you need it.  We recommend signing up for the patient portal called "MyChart".  Sign up information is provided on this After Visit Summary.  MyChart is used to connect with patients for Virtual Visits (Telemedicine).  Patients are able to view lab/test results, encounter notes, upcoming appointments, etc.  Non-urgent messages can be sent to your provider as well.   To learn more about what you can do with MyChart, go to [email or web address].    Your next appointment:   6 month(s)  The format for your next appointment:   In Person  Provider:   Dr.Acharya

## 2020-01-09 ENCOUNTER — Other Ambulatory Visit: Payer: Self-pay

## 2020-01-10 ENCOUNTER — Ambulatory Visit: Payer: Medicare PPO | Admitting: Endocrinology

## 2020-01-10 ENCOUNTER — Encounter: Payer: Self-pay | Admitting: Endocrinology

## 2020-01-10 VITALS — BP 130/90 | HR 70 | Ht 63.0 in | Wt 135.2 lb

## 2020-01-10 DIAGNOSIS — E0520 Thyrotoxicosis with toxic multinodular goiter without thyrotoxic crisis or storm: Secondary | ICD-10-CM | POA: Diagnosis not present

## 2020-01-10 LAB — T4, FREE: Free T4: 2.05 ng/dL — ABNORMAL HIGH (ref 0.60–1.60)

## 2020-01-10 LAB — TSH: TSH: 2.53 u[IU]/mL (ref 0.35–4.50)

## 2020-01-10 NOTE — Progress Notes (Signed)
Subjective:    Patient ID: Ariel Lambert, female    DOB: May 31, 1936, 83 y.o.   MRN: [id number]  HPI Pt returns for f/u of hyperthyroidism due to small multinodular goiter (dx'ed approx 1999; she chose chronic tapazole rx; most recent US was in 2015--no change; it has been well-controlled with tapazole; in 2020, she had neck mass which proved to car carotid pseudoaneurysm, which was repaired in early 2021). She alternates tapazole, 1 pill and 1/2 pill, each day.  pt states she feels well in general.  Denies neck swelling or pain.  Past Medical History:  Diagnosis Date  . Allergy   . Arthritis   . ASTHMA 05/03/2009  . Asthma   . Chronic kidney disease    " in remission"  . CVA (cerebral infarction)   . Dyslipidemia   . Headache   . Hearing aid worn    B/L  . HYPERTENSION 04/30/2007  . HYPERTHYROIDISM 06/17/2007  . Menopause   . Multinodular goiter   . OSTEOPOROSIS 04/30/2007  . Wears glasses     Past Surgical History:  Procedure Laterality Date  . BACK SURGERY    . BREAST EXCISIONAL BIOPSY Left   . carotid artery surgery    . CATARACT EXTRACTION W/ INTRAOCULAR LENS  IMPLANT, BILATERAL    . ENDARTERECTOMY Right 09/29/2019   Procedure: REDO OF RIGHT ENDARTERECTOMY CAROTID;  Surgeon: Maeola Harman, MD;  [facility]: [facility];  Service: Vascular;  Laterality: Right;  . NECK SURGERY    . OTHER SURGICAL HISTORY     discectomy    Social History   Socioeconomic History  . Marital status: Widowed    Spouse name: Not on file  . Number of children: Not on file  . Years of education: Not on file  . Highest education level: Not on file  Occupational History  . Occupation: Magazine features editor: RETIRED  Tobacco Use  . Smoking status: Former Smoker    Types: Cigarettes  . Smokeless tobacco: Never Used  . Tobacco comment: "Quit smoking cigarettes in my 40's "  Substance and Sexual Activity  . Alcohol use: Yes    Alcohol/week: 0.0 standard drinks    Comment:  Socially  . Drug use: No  . Sexual activity: Not on file  Other Topics Concern  . Not on file  Social History Narrative   Lives alone.   Social Determinants of Health   Financial Resource Strain:   . Difficulty of Paying Living Expenses:   Food Insecurity:   . Worried About Programme researcher, broadcasting/film/video in the Last Year:   . Barista in the Last Year:   Transportation Needs:   . Freight forwarder (Medical):   Marland Kitchen Lack of Transportation (Non-Medical):   Physical Activity:   . Days of Exercise per Week:   . Minutes of Exercise per Session:   Stress:   . Feeling of Stress :   Social Connections:   . Frequency of Communication with Friends and Family:   . Frequency of Social Gatherings with Friends and Family:   . Attends Religious Services:   . Active Member of Clubs or Organizations:   . Attends Banker Meetings:   Marland Kitchen Marital Status:   Intimate Partner Violence:   . Fear of Current or Ex-Partner:   . Emotionally Abused:   Marland Kitchen Physically Abused:   . Sexually Abused:     Current Outpatient Medications on File Prior to Visit  Medication Sig  Dispense Refill  . albuterol (PROVENTIL HFA;VENTOLIN HFA) 108 (90 BASE) MCG/ACT inhaler Inhale 2 puffs into the lungs every 6 (six) hours as needed. (Patient taking differently: Inhale 2 puffs into the lungs every 6 (six) hours as needed for wheezing or shortness of breath. ) 1 Inhaler 6  . alendronate (FOSAMAX) 70 MG tablet Take 70 mg by mouth every Friday. Take with a full glass of water on an empty stomach.     Marland Kitchen aspirin EC 81 MG EC tablet Take 1 tablet (81 mg total) by mouth daily at 6 (six) AM.    . azelastine (ASTELIN) 0.1 % nasal spray Place 1 spray into both nostrils at bedtime. Use in each nostril as directed    . b complex vitamins tablet Take 1 tablet by mouth daily.      Marland Kitchen BIOTIN PO Take 1 tablet by mouth daily.    . Calcium Carbonate-Vitamin D (CALCIUM 500 + D PO) Take 1 tablet by mouth 2 (two) times daily.     Marland Kitchen  ezetimibe-simvastatin (VYTORIN) 10-20 MG per tablet Take 1 tablet by mouth daily after breakfast.     . fluticasone (FLONASE) 50 MCG/ACT nasal spray Place 1 spray into both nostrils daily.    Marland Kitchen ipratropium (ATROVENT) 0.03 % nasal spray Place 2 sprays into both nostrils 2 (two) times daily as needed for rhinitis.    Marland Kitchen levalbuterol (XOPENEX) 0.63 MG/3ML nebulizer solution Take 3 mLs (0.63 mg total) by nebulization every 4 (four) hours. (Patient taking differently: Take 0.63 mg by nebulization every 6 (six) hours as needed for wheezing or shortness of breath. ) 3 mL 12  . levocetirizine (XYZAL) 5 MG tablet Take 5 mg by mouth at bedtime.    . methimazole (TAPAZOLE) 5 MG tablet Take qd (alternate 1/2 and 1 pill) 45 tablet 3  . mirabegron ER (MYRBETRIQ) 50 MG TB24 tablet Take 50 mg by mouth daily.    . montelukast (SINGULAIR) 10 MG tablet Take 10 mg by mouth at bedtime.    . Multiple Vitamin (MULTIVITAMIN WITH MINERALS) TABS tablet Take 1 tablet by mouth daily.    . ondansetron (ZOFRAN) 4 MG tablet Take 1 tablet (4 mg total) by mouth every 8 (eight) hours as needed for nausea or vomiting. 30 tablet 0  . SYMBICORT 160-4.5 MCG/ACT inhaler Inhale 2 puffs into the lungs 2 (two) times daily.    Marland Kitchen telmisartan (MICARDIS) 80 MG tablet Take 40 mg by mouth 2 (two) times daily.     No current facility-administered medications on file prior to visit.    Allergies  Allergen Reactions  . Cetirizine & Related Other (See Comments)    Stomach pains  . Codeine Itching    Severe itching  . Meperidine Hcl Diarrhea and Nausea And Vomiting    (Demerol)  . Nabumetone Rash    (Relafen)  . Naproxen Itching and Rash  . Sulfonamide Derivatives Rash    Family History  Problem Relation Age of Onset  . Heart disease Other   . Breast cancer Mother 60    BP 130/90 (BP Location: Left Arm, Patient Position: Sitting, Cuff Size: Normal)   Pulse 70   Ht 5\' 3"  (1.6 m)   Wt 135 lb 3.2 oz (61.3 kg)   SpO2 99%   BMI 23.95  kg/m    Review of Systems Denies fever    Objective:   Physical Exam VITAL SIGNS:  See vs page GENERAL: no distress Neck: a healed scar is present.  I  do not appreciate a nodule in the thyroid or elsewhere in the neck.     Lab Results  Component Value Date   TSH 2.53 01/10/2020      Assessment & Plan:  Hyperthyroidism: well-controlled. Please continue the same medications Please come back for a follow-up appointment in 6 months

## 2020-01-10 NOTE — Patient Instructions (Signed)
Blood tests are requested for you today.  We'll let you know about the results.  If ever you have fever while taking methimazole, stop it and call us, even if the reason is obvious, because of the risk of a rare side-effect. It is best to never miss the medication.  However, if you do miss it, next best is to double up the next time.   Please come back for a follow-up appointment in 6 months.   

## 2020-02-16 ENCOUNTER — Other Ambulatory Visit: Payer: Self-pay | Admitting: Family Medicine

## 2020-02-16 DIAGNOSIS — Z1231 Encounter for screening mammogram for malignant neoplasm of breast: Secondary | ICD-10-CM

## 2020-03-20 ENCOUNTER — Other Ambulatory Visit: Payer: Self-pay

## 2020-03-20 ENCOUNTER — Ambulatory Visit
Admission: RE | Admit: 2020-03-20 | Discharge: 2020-03-20 | Disposition: A | Discharge status: Home / Self Care | Payer: Medicare PPO | Source: Ambulatory Visit | Attending: Family Medicine | Admitting: Family Medicine

## 2020-03-20 DIAGNOSIS — Z1231 Encounter for screening mammogram for malignant neoplasm of breast: Secondary | ICD-10-CM | POA: Diagnosis not present

## 2020-03-27 ENCOUNTER — Other Ambulatory Visit: Payer: Self-pay | Admitting: Endocrinology

## 2020-04-13 DIAGNOSIS — R351 Nocturia: Secondary | ICD-10-CM | POA: Diagnosis not present

## 2020-04-13 DIAGNOSIS — N3946 Mixed incontinence: Secondary | ICD-10-CM | POA: Diagnosis not present

## 2020-04-14 DIAGNOSIS — N3946 Mixed incontinence: Secondary | ICD-10-CM | POA: Diagnosis not present

## 2020-05-17 DIAGNOSIS — E785 Hyperlipidemia, unspecified: Secondary | ICD-10-CM | POA: Diagnosis not present

## 2020-05-17 DIAGNOSIS — Z7951 Long term (current) use of inhaled steroids: Secondary | ICD-10-CM | POA: Diagnosis not present

## 2020-05-17 DIAGNOSIS — Z882 Allergy status to sulfonamides status: Secondary | ICD-10-CM | POA: Diagnosis not present

## 2020-05-17 DIAGNOSIS — N3281 Overactive bladder: Secondary | ICD-10-CM | POA: Diagnosis not present

## 2020-05-17 DIAGNOSIS — Z8673 Personal history of transient ischemic attack (TIA), and cerebral infarction without residual deficits: Secondary | ICD-10-CM | POA: Diagnosis not present

## 2020-05-17 DIAGNOSIS — R32 Unspecified urinary incontinence: Secondary | ICD-10-CM | POA: Diagnosis not present

## 2020-05-17 DIAGNOSIS — Z823 Family history of stroke: Secondary | ICD-10-CM | POA: Diagnosis not present

## 2020-05-17 DIAGNOSIS — M810 Age-related osteoporosis without current pathological fracture: Secondary | ICD-10-CM | POA: Diagnosis not present

## 2020-05-17 DIAGNOSIS — J45909 Unspecified asthma, uncomplicated: Secondary | ICD-10-CM | POA: Diagnosis not present

## 2020-05-17 DIAGNOSIS — I10 Essential (primary) hypertension: Secondary | ICD-10-CM | POA: Diagnosis not present

## 2020-05-17 DIAGNOSIS — E0590 Thyrotoxicosis, unspecified without thyrotoxic crisis or storm: Secondary | ICD-10-CM | POA: Diagnosis not present

## 2020-05-17 DIAGNOSIS — G4700 Insomnia, unspecified: Secondary | ICD-10-CM | POA: Diagnosis not present

## 2020-05-17 DIAGNOSIS — Z87891 Personal history of nicotine dependence: Secondary | ICD-10-CM | POA: Diagnosis not present

## 2020-05-17 DIAGNOSIS — Z885 Allergy status to narcotic agent status: Secondary | ICD-10-CM | POA: Diagnosis not present

## 2020-05-17 DIAGNOSIS — Z8249 Family history of ischemic heart disease and other diseases of the circulatory system: Secondary | ICD-10-CM | POA: Diagnosis not present

## 2020-06-05 DIAGNOSIS — H52203 Unspecified astigmatism, bilateral: Secondary | ICD-10-CM | POA: Diagnosis not present

## 2020-06-05 DIAGNOSIS — Z961 Presence of intraocular lens: Secondary | ICD-10-CM | POA: Diagnosis not present

## 2020-06-05 DIAGNOSIS — H26492 Other secondary cataract, left eye: Secondary | ICD-10-CM | POA: Diagnosis not present

## 2020-06-08 DIAGNOSIS — N3946 Mixed incontinence: Secondary | ICD-10-CM | POA: Diagnosis not present

## 2020-06-08 DIAGNOSIS — R350 Frequency of micturition: Secondary | ICD-10-CM | POA: Diagnosis not present

## 2020-06-19 DIAGNOSIS — J301 Allergic rhinitis due to pollen: Secondary | ICD-10-CM | POA: Diagnosis not present

## 2020-06-19 DIAGNOSIS — J449 Chronic obstructive pulmonary disease, unspecified: Secondary | ICD-10-CM | POA: Diagnosis not present

## 2020-06-19 DIAGNOSIS — J3089 Other allergic rhinitis: Secondary | ICD-10-CM | POA: Diagnosis not present

## 2020-06-19 DIAGNOSIS — L309 Dermatitis, unspecified: Secondary | ICD-10-CM | POA: Diagnosis not present

## 2020-06-26 DIAGNOSIS — U071 COVID-19: Secondary | ICD-10-CM | POA: Diagnosis not present

## 2020-06-26 DIAGNOSIS — J45909 Unspecified asthma, uncomplicated: Secondary | ICD-10-CM | POA: Diagnosis not present

## 2020-06-27 DIAGNOSIS — Z20822 Contact with and (suspected) exposure to covid-19: Secondary | ICD-10-CM | POA: Diagnosis not present

## 2020-07-05 DIAGNOSIS — H26492 Other secondary cataract, left eye: Secondary | ICD-10-CM | POA: Diagnosis not present

## 2020-07-11 ENCOUNTER — Other Ambulatory Visit: Payer: Self-pay

## 2020-07-11 ENCOUNTER — Ambulatory Visit: Payer: Medicare PPO | Admitting: Endocrinology

## 2020-07-11 ENCOUNTER — Encounter: Payer: Self-pay | Admitting: Endocrinology

## 2020-07-11 VITALS — BP 118/84 | HR 80 | Temp 99.0°F | Ht 63.0 in | Wt 127.0 lb

## 2020-07-11 DIAGNOSIS — E0520 Thyrotoxicosis with toxic multinodular goiter without thyrotoxic crisis or storm: Secondary | ICD-10-CM

## 2020-07-11 LAB — TSH: TSH: 1.73 u[IU]/mL (ref 0.35–4.50)

## 2020-07-11 LAB — T4, FREE: Free T4: 1.08 ng/dL (ref 0.60–1.60)

## 2020-07-11 MED ORDER — TRIAMCINOLONE ACETONIDE 0.1 % EX CREA: 1.0000 "application " | TOPICAL_CREAM | Freq: Four times a day (QID) | CUTANEOUS | 0 refills | Status: DC | PRN

## 2020-07-11 NOTE — Progress Notes (Signed)
Subjective:    Patient ID: Ariel Lambert, female    DOB: 30-Aug-1936, 84 y.o.   MRN: [id number]  HPI Pt returns for f/u of hyperthyroidism due to small multinodular goiter (dx'ed approx 1999; she chose chronic tapazole rx; most recent US was in 2015--no change; it has been well-controlled with tapazole; in 2020, she had neck mass which proved to car carotid pseudoaneurysm, which was repaired in early 2021). She alternates tapazole, 1 pill and 1/2 pill, each day.  pt states she feels well in general.  Denies neck swelling or pain.  She has 2 days of itchy rash throughout the body. She is unable to cite precip factor.  Past Medical History:  Diagnosis Date  . Allergy   . Arthritis   . ASTHMA 05/03/2009  . Asthma   . Chronic kidney disease    " in remission"  . CVA (cerebral infarction)   . Dyslipidemia   . Headache   . Hearing aid worn    B/L  . HYPERTENSION 04/30/2007  . HYPERTHYROIDISM 06/17/2007  . Menopause   . Multinodular goiter   . OSTEOPOROSIS 04/30/2007  . Wears glasses     Past Surgical History:  Procedure Laterality Date  . BACK SURGERY    . BREAST EXCISIONAL BIOPSY Left   . carotid artery surgery    . CATARACT EXTRACTION W/ INTRAOCULAR LENS  IMPLANT, BILATERAL    . ENDARTERECTOMY Right 09/29/2019   Procedure: REDO OF RIGHT ENDARTERECTOMY CAROTID;  Surgeon: Maeola Harman, MD;  Location: [facility] OR;  Service: Vascular;  Laterality: Right;  . NECK SURGERY    . OTHER SURGICAL HISTORY     discectomy    Social History   Socioeconomic History  . Marital status: Widowed    Spouse name: Not on file  . Number of children: Not on file  . Years of education: Not on file  . Highest education level: Not on file  Occupational History  . Occupation: Magazine features editor: RETIRED  Tobacco Use  . Smoking status: Former Smoker    Types: Cigarettes  . Smokeless tobacco: Never Used  . Tobacco comment: "Quit smoking cigarettes in my 40's "  Vaping Use  .  Vaping Use: Never used  Substance and Sexual Activity  . Alcohol use: Yes    Alcohol/week: 0.0 standard drinks    Comment: Socially  . Drug use: No  . Sexual activity: Not on file  Other Topics Concern  . Not on file  Social History Narrative   Lives alone.   Social Determinants of Health   Financial Resource Strain:   . Difficulty of Paying Living Expenses: Not on file  Food Insecurity:   . Worried About Programme researcher, broadcasting/film/video in the Last Year: Not on file  . Ran Out of Food in the Last Year: Not on file  Transportation Needs:   . Lack of Transportation (Medical): Not on file  . Lack of Transportation (Non-Medical): Not on file  Physical Activity:   . Days of Exercise per Week: Not on file  . Minutes of Exercise per Session: Not on file  Stress:   . Feeling of Stress : Not on file  Social Connections:   . Frequency of Communication with Friends and Family: Not on file  . Frequency of Social Gatherings with Friends and Family: Not on file  . Attends Religious Services: Not on file  . Active Member of Clubs or Organizations: Not on file  . Attends Club  or Organization Meetings: Not on file  . Marital Status: Not on file  Intimate Partner Violence:   . Fear of Current or Ex-Partner: Not on file  . Emotionally Abused: Not on file  . Physically Abused: Not on file  . Sexually Abused: Not on file    Current Outpatient Medications on File Prior to Visit  Medication Sig Dispense Refill  . albuterol (PROVENTIL HFA;VENTOLIN HFA) 108 (90 BASE) MCG/ACT inhaler Inhale 2 puffs into the lungs every 6 (six) hours as needed. (Patient taking differently: Inhale 2 puffs into the lungs every 6 (six) hours as needed for wheezing or shortness of breath. ) 1 Inhaler 6  . alendronate (FOSAMAX) 70 MG tablet Take 70 mg by mouth every Friday. Take with a full glass of water on an empty stomach.     Marland Kitchen aspirin EC 81 MG EC tablet Take 1 tablet (81 mg total) by mouth daily at 6 (six) AM.    . azelastine  (ASTELIN) 0.1 % nasal spray Place 1 spray into both nostrils at bedtime. Use in each nostril as directed    . b complex vitamins tablet Take 1 tablet by mouth daily.      Marland Kitchen BIOTIN PO Take 1 tablet by mouth daily.    . Calcium Carbonate-Vitamin D (CALCIUM 500 + D PO) Take 1 tablet by mouth 2 (two) times daily.     Marland Kitchen ezetimibe-simvastatin (VYTORIN) 10-20 MG per tablet Take 1 tablet by mouth daily after breakfast.     . fluticasone (FLONASE) 50 MCG/ACT nasal spray Place 1 spray into both nostrils daily.    Marland Kitchen ipratropium (ATROVENT) 0.03 % nasal spray Place 2 sprays into both nostrils 2 (two) times daily as needed for rhinitis.    Marland Kitchen levalbuterol (XOPENEX) 0.63 MG/3ML nebulizer solution Take 3 mLs (0.63 mg total) by nebulization every 4 (four) hours. (Patient taking differently: Take 0.63 mg by nebulization every 6 (six) hours as needed for wheezing or shortness of breath. ) 3 mL 12  . levocetirizine (XYZAL) 5 MG tablet Take 5 mg by mouth at bedtime.    . methimazole (TAPAZOLE) 10 MG tablet TAKE 1 TABLET BY MOUTH EVERY DAY 90 tablet 1  . mirabegron ER (MYRBETRIQ) 50 MG TB24 tablet Take 50 mg by mouth daily.    . montelukast (SINGULAIR) 10 MG tablet Take 10 mg by mouth at bedtime.    . Multiple Vitamin (MULTIVITAMIN WITH MINERALS) TABS tablet Take 1 tablet by mouth daily.    . ondansetron (ZOFRAN) 4 MG tablet Take 1 tablet (4 mg total) by mouth every 8 (eight) hours as needed for nausea or vomiting. 30 tablet 0  . SYMBICORT 160-4.5 MCG/ACT inhaler Inhale 2 puffs into the lungs 2 (two) times daily.    Marland Kitchen telmisartan (MICARDIS) 80 MG tablet Take 40 mg by mouth 2 (two) times daily.     No current facility-administered medications on file prior to visit.    Allergies  Allergen Reactions  . Cetirizine & Related Other (See Comments)    Stomach pains  . Codeine Itching    Severe itching  . Meperidine Hcl Diarrhea and Nausea And Vomiting    (Demerol)  . Nabumetone Rash    (Relafen)  . Naproxen Itching  and Rash  . Sulfonamide Derivatives Rash    Family History  Problem Relation Age of Onset  . Heart disease Other   . Breast cancer Mother 60    BP 118/84   Pulse 80   Temp 99 F (  37.2 C)   Ht 5\' 3"  (1.6 m)   Wt 127 lb (57.6 kg)   SpO2 95%   BMI 22.50 kg/m    Review of Systems Denies sob and fever.      Objective:   Physical Exam VITAL SIGNS:  See vs page GENERAL: no distress Pulses: dorsalis pedis intact bilat.   MSK: no deformity of the feet CV: no leg edema Skin:  no ulcer on the feet.  normal color and temp on the feet. Neuro: sensation is intact to touch on the feet.    Lab Results  Component Value Date   TSH 1.73 07/11/2020       Assessment & Plan:  Hyperthyroidism: well-controlled.  Please continue the same methimazole Rash, new: uncertain etiology and prognosis  Patient Instructions  Blood tests are requested for you today.  We'll let you know about the results.   If ever you have fever while taking methimazole, stop it and call 07/13/2020, even if the reason is obvious, because of the risk of a rare side-effect.   It is best to never miss the medication.  However, if you do miss it, next best is to double up the next time.   I have sent a prescription to your pharmacy, for a skin cream, for the itching and rash.    Please call Dr [place] if you still have this next week.   Please come back for a follow-up appointment in 6 months.

## 2020-07-11 NOTE — Patient Instructions (Addendum)
Blood tests are requested for you today.  We'll let you know about the results.   If ever you have fever while taking methimazole, stop it and call us, even if the reason is obvious, because of the risk of a rare side-effect.   It is best to never miss the medication.  However, if you do miss it, next best is to double up the next time.   I have sent a prescription to your pharmacy, for a skin cream, for the itching and rash.    Please call Dr Paulino Rily if you still have this next week.   Please come back for a follow-up appointment in 6 months.

## 2020-07-12 DIAGNOSIS — N3000 Acute cystitis without hematuria: Secondary | ICD-10-CM | POA: Diagnosis not present

## 2020-07-12 DIAGNOSIS — L508 Other urticaria: Secondary | ICD-10-CM | POA: Diagnosis not present

## 2020-07-13 ENCOUNTER — Ambulatory Visit: Payer: Medicare PPO | Admitting: Internal Medicine

## 2020-07-23 DIAGNOSIS — H1013 Acute atopic conjunctivitis, bilateral: Secondary | ICD-10-CM | POA: Diagnosis not present

## 2020-08-06 ENCOUNTER — Ambulatory Visit: Payer: Medicare PPO | Admitting: Internal Medicine

## 2020-09-17 ENCOUNTER — Other Ambulatory Visit: Payer: Self-pay | Admitting: Endocrinology

## 2020-09-18 ENCOUNTER — Other Ambulatory Visit: Payer: Self-pay

## 2020-09-18 ENCOUNTER — Ambulatory Visit: Payer: Medicare PPO | Admitting: Internal Medicine

## 2020-09-18 ENCOUNTER — Encounter: Payer: Self-pay | Admitting: Internal Medicine

## 2020-09-18 VITALS — BP 160/100 | HR 78 | Ht 63.0 in | Wt 132.0 lb

## 2020-09-18 DIAGNOSIS — I10 Essential (primary) hypertension: Secondary | ICD-10-CM | POA: Diagnosis not present

## 2020-09-18 DIAGNOSIS — R9431 Abnormal electrocardiogram [ECG] [EKG]: Secondary | ICD-10-CM | POA: Diagnosis not present

## 2020-09-18 DIAGNOSIS — G459 Transient cerebral ischemic attack, unspecified: Secondary | ICD-10-CM | POA: Diagnosis not present

## 2020-09-18 NOTE — Patient Instructions (Signed)
Medication Instructions:  No Changes In Medications at this time.  *If you need a refill on your cardiac medications before your next appointment, please call your pharmacy*  Testing/Procedures: Your physician has requested that you have an echocardiogram. Echocardiography is a painless test that uses sound waves to create images of your heart. It provides your doctor with information about the size and shape of your heart and how well your heart's chambers and valves are working. You may receive an ultrasound enhancing agent through an IV if needed to better visualize your heart during the echo.This procedure takes approximately one hour. There are no restrictions for this procedure. This will take place at the [address], [address].   Follow-Up: At [facility], you and your health needs are our priority.  As part of our continuing mission to provide you with exceptional heart care, we have created designated Provider Care Teams.  These Care Teams include your primary Cardiologist (physician) and Advanced Practice Providers (APPs -  Physician Assistants and Nurse Practitioners) who all work together to provide you with the care you need, when you need it.  Your next appointment:   6 month(s)  The format for your next appointment:   In Person  Provider:   Gayatri Acharya, MD 

## 2020-09-18 NOTE — Progress Notes (Signed)
Cardiology Office Note:    Date:  09/18/2020   ID:  Ariel Lambert, DOB 1936/04/23, MRN [id number]  PCP:  Mila Palmer, MD  Cardiologist:  Parke Poisson, MD  Electrophysiologist:  None   Referring MD: Mila Palmer, MD   Chief Complaint/Reason for Referral: Carotid artery disease, CVA  History of Present Illness:    Ariel Lambert is a 85 y.o. female with a history of bilateral carotid endarterectomy in 2001. I last saw her in April 2021 for after repair of pseudoaneurysm off the inferior aspect of the previous patch angioplasty of the carotid endarterectomy on the right.  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   She feels well overall.  She is still having balance issues after surgery.  She has a follow-up appointment with Dr. Randie Heinz that she needs to schedule, and I have encouraged her to do so.  She also reports some balance issues after a TIA in 2001 but feels these did not persist and her balance issues are new since surgery.  I have asked her to review this with vascular surgery.  She may also want to consider evaluation with neurology given her history of TIA.  I am not aware of a known diagnosis of atrial fibrillation, though we should consider this if there is any concern for recurrent stroke.  She has evidence of an ectopic atrial rhythm on ECG today.  This is new since her prior ECG.  She has normal size atria from an echocardiogram in 2017.  Her blood pressure is noted to be quite elevated today, but is normal at home.  She had some difficulty getting to the office today and attributes her blood pressure reading to this.  She denies chest pain or shortness of breath with her activities.  Denies palpitations.  Denies dizziness but does have balance issues.  Denies syncope or presyncope.   Past Medical History:  Diagnosis Date  . Allergy   . Arthritis   . ASTHMA  05/03/2009  . Asthma   . Chronic kidney disease    " in remission"  . CVA (cerebral infarction)   . Dyslipidemia   . Headache   . Hearing aid worn    B/L  . HYPERTENSION 04/30/2007  . HYPERTHYROIDISM 06/17/2007  . Menopause   . Multinodular goiter   . OSTEOPOROSIS 04/30/2007  . Wears glasses     Past Surgical History:  Procedure Laterality Date  . BACK SURGERY    . BREAST EXCISIONAL BIOPSY Left   . carotid artery surgery    . CATARACT EXTRACTION W/ INTRAOCULAR LENS  IMPLANT, BILATERAL    . ENDARTERECTOMY Right 09/29/2019   Procedure: REDO OF RIGHT ENDARTERECTOMY CAROTID;  Surgeon: Maeola Harman, MD;  Location: [facility] OR;  Service: Vascular;  Laterality: Right;  . NECK SURGERY    . OTHER SURGICAL HISTORY     discectomy    Current Medications: Current Meds  Medication Sig  . albuterol (PROVENTIL HFA;VENTOLIN HFA) 108 (90 BASE) MCG/ACT inhaler Inhale 2 puffs into the lungs every 6 (six) hours as needed. (Patient taking differently: Inhale 2 puffs into the lungs every 6 (six) hours as needed for wheezing or shortness of breath.)  . alendronate (FOSAMAX) 70 MG tablet Take 70 mg by mouth every Friday. Take with a full glass of water on an empty stomach.  Marland Kitchen aspirin EC 81 MG EC tablet Take 1 tablet (81 mg total) by mouth  daily at 6 (six) AM.  . azelastine (ASTELIN) 0.1 % nasal spray Place 1 spray into both nostrils at bedtime. Use in each nostril as directed  . b complex vitamins tablet Take 1 tablet by mouth daily.  Marland Kitchen BIOTIN PO Take 1 tablet by mouth daily.  . calcium carbonate (OSCAL) 1500 (600 Ca) MG TABS tablet D 1 tablet with meals  . diphenhydrAMINE (BENADRYL) 25 MG tablet 1 tablet at bedtime as needed  . ezetimibe-simvastatin (VYTORIN) 10-20 MG per tablet Take 1 tablet by mouth daily after breakfast.  . fluticasone (FLONASE) 50 MCG/ACT nasal spray Place 1 spray into both nostrils daily.  Marland Kitchen ipratropium (ATROVENT) 0.03 % nasal spray Place 2 sprays into both nostrils 2 (two)  times daily as needed for rhinitis.  Marland Kitchen levalbuterol (XOPENEX) 0.63 MG/3ML nebulizer solution Take 3 mLs (0.63 mg total) by nebulization every 4 (four) hours. (Patient taking differently: Take 0.63 mg by nebulization every 6 (six) hours as needed for wheezing or shortness of breath.)  . levocetirizine (XYZAL) 5 MG tablet Take 5 mg by mouth at bedtime.  . methimazole (TAPAZOLE) 10 MG tablet TAKE 1 TABLET BY MOUTH EVERY DAY  . montelukast (SINGULAIR) 10 MG tablet Take 10 mg by mouth at bedtime.  . Multiple Vitamin (MULTIVITAMIN WITH MINERALS) TABS tablet Take 1 tablet by mouth daily.  . SYMBICORT 160-4.5 MCG/ACT inhaler Inhale 2 puffs into the lungs 2 (two) times daily.  Marland Kitchen telmisartan (MICARDIS) 80 MG tablet Take 40 mg by mouth 2 (two) times daily.     Allergies:   Cetirizine & related, Codeine, Meperidine hcl, Nabumetone, Naproxen, and Sulfonamide derivatives   Social History   Tobacco Use  . Smoking status: Former Smoker    Types: Cigarettes  . Smokeless tobacco: Never Used  . Tobacco comment: "Quit smoking cigarettes in my 44's "  Vaping Use  . Vaping Use: Never used  Substance Use Topics  . Alcohol use: Yes    Alcohol/week: 0.0 standard drinks    Comment: Socially  . Drug use: No     Family History: The patient's family history includes Breast cancer (age of onset: 43) in her mother; Heart disease in an other family member.  ROS:   Please see the history of present illness.    All other systems reviewed and are negative.  EKGs/Labs/Other Studies Reviewed:    The following studies were reviewed today:  EKG: Ectopic atrial rhythm, left ventricular hypertrophy, nonspecific ST abnormality  Recent Labs: 09/22/2019: ALT 15 09/30/2019: BUN 10; Creatinine, Ser 0.52; Hemoglobin 11.2; Platelets 190; Potassium 3.8; Sodium 134 07/11/2020: TSH 1.73  Recent Lipid Panel No results found for: CHOL, TRIG, HDL, CHOLHDL, VLDL, LDLCALC, LDLDIRECT  Physical Exam:    VS:  BP (!) 160/100    Pulse 78   Ht 5\' 3"  (1.6 m)   Wt 132 lb (59.9 kg)   SpO2 96%   BMI 23.38 kg/m     Wt Readings from Last 5 Encounters:  09/18/20 132 lb (59.9 kg)  07/11/20 127 lb (57.6 kg)  01/10/20 135 lb 3.2 oz (61.3 kg)  01/04/20 136 lb (61.7 kg)  10/21/19 134 lb (60.8 kg)    Constitutional: No acute distress Eyes: sclera non-icteric, normal conjunctiva and lids ENMT: normal dentition, moist mucous membranes Cardiovascular: regular rhythm, normal rate, soft systolic murmur. S1 and S2 normal. Radial pulses normal bilaterally. No jugular venous distention.  Respiratory: clear to auscultation bilaterally GI : normal bowel sounds, soft and nontender. No distention.   MSK:  extremities warm, well perfused. No edema.  NEURO: grossly nonfocal exam, moves all extremities. PSYCH: alert and oriented x 3, normal mood and affect.   ASSESSMENT:    1. Essential hypertension   2. TIA (transient ischemic attack)   3. Abnormal EKG    PLAN:    Essential hypertension - Plan: EKG 12-Lead  She reports normal blood pressure at home.  We reviewed her blood pressure reading today which is quite elevated but she feels this is situational due to difficulty getting to the office today.  We will recheck this at follow-up and she should have this evaluated at her next office visits with vascular surgery and PCP.  Recommend continuing telmisartan 80 mg daily.  TIA (transient ischemic attack) - Plan: ECHOCARDIOGRAM COMPLETE Abnormal EKG  She reports a history of TIA, and new balance symptoms.  To exclude a cardiovascular cause we will start with an echocardiogram.  Echocardiogram will also be helpful in the setting of abnormal EKG with evidence of LVH, ectopic atrial rhythm, and nonspecific ST changes.  She overall feels well, and stress testing is not indicated as of yet.  Total time of encounter: 30 minutes total time of encounter, including 20 minutes spent in face-to-face patient care on the date of this encounter.  This time includes coordination of care and counseling regarding above mentioned problem list. Remainder of non-face-to-face time involved reviewing chart documents/testing relevant to the patient encounter and documentation in the medical record. I have independently reviewed documentation from referring provider.   Weston Brass, MD [place]  [facility]    Medication Adjustments/Labs and Tests Ordered: Current medicines are reviewed at length with the patient today.  Concerns regarding medicines are outlined above.   Orders Placed This Encounter  Procedures  . EKG 12-Lead  . ECHOCARDIOGRAM COMPLETE   No orders of the defined types were placed in this encounter.   Patient Instructions  Medication Instructions:  No Changes In Medications at this time.  *If you need a refill on your cardiac medications before your next appointment, please call your pharmacy*  Testing/Procedures: Your physician has requested that you have an echocardiogram. Echocardiography is a painless test that uses sound waves to create images of your heart. It provides your doctor with information about the size and shape of your heart and how well your heart's chambers and valves are working. You may receive an ultrasound enhancing agent through an IV if needed to better visualize your heart during the echo.This procedure takes approximately one hour. There are no restrictions for this procedure. This will take place at the [address].   Follow-Up: At [facility], you and your health needs are our priority.  As part of our continuing mission to provide you with exceptional heart care, we have created designated Provider Care Teams.  These Care Teams include your primary Cardiologist (physician) and Advanced Practice Providers (APPs -  Physician Assistants and Nurse Practitioners) who all work together to provide you with the care you need, when you need it.  Your next appointment:   6  month(s)  The format for your next appointment:   In Person  Provider:   Weston Brass, MD

## 2020-09-24 DIAGNOSIS — M170 Bilateral primary osteoarthritis of knee: Secondary | ICD-10-CM | POA: Diagnosis not present

## 2020-09-24 DIAGNOSIS — M25561 Pain in right knee: Secondary | ICD-10-CM | POA: Diagnosis not present

## 2020-09-24 DIAGNOSIS — M1711 Unilateral primary osteoarthritis, right knee: Secondary | ICD-10-CM | POA: Diagnosis not present

## 2020-09-24 DIAGNOSIS — M1712 Unilateral primary osteoarthritis, left knee: Secondary | ICD-10-CM | POA: Diagnosis not present

## 2020-09-24 DIAGNOSIS — M25562 Pain in left knee: Secondary | ICD-10-CM | POA: Diagnosis not present

## 2020-10-08 ENCOUNTER — Ambulatory Visit (HOSPITAL_COMMUNITY): Payer: Medicare PPO | Attending: Internal Medicine

## 2020-10-08 ENCOUNTER — Other Ambulatory Visit: Payer: Self-pay

## 2020-10-08 DIAGNOSIS — R9431 Abnormal electrocardiogram [ECG] [EKG]: Secondary | ICD-10-CM | POA: Diagnosis not present

## 2020-10-08 DIAGNOSIS — G459 Transient cerebral ischemic attack, unspecified: Secondary | ICD-10-CM | POA: Diagnosis not present

## 2020-10-08 LAB — ECHOCARDIOGRAM COMPLETE
Area-P 1/2: 2.34 cm2
S' Lateral: 2.7 cm

## 2020-10-11 ENCOUNTER — Other Ambulatory Visit: Payer: Self-pay

## 2020-10-11 DIAGNOSIS — I700 Atherosclerosis of aorta: Secondary | ICD-10-CM | POA: Insufficient documentation

## 2020-10-11 DIAGNOSIS — E2839 Other primary ovarian failure: Secondary | ICD-10-CM | POA: Insufficient documentation

## 2020-10-11 DIAGNOSIS — N031 Chronic nephritic syndrome with focal and segmental glomerular lesions: Secondary | ICD-10-CM | POA: Insufficient documentation

## 2020-10-11 DIAGNOSIS — N3281 Overactive bladder: Secondary | ICD-10-CM | POA: Insufficient documentation

## 2020-10-11 DIAGNOSIS — Z9889 Other specified postprocedural states: Secondary | ICD-10-CM | POA: Insufficient documentation

## 2020-10-11 DIAGNOSIS — R2689 Other abnormalities of gait and mobility: Secondary | ICD-10-CM | POA: Insufficient documentation

## 2020-10-11 DIAGNOSIS — I6523 Occlusion and stenosis of bilateral carotid arteries: Secondary | ICD-10-CM | POA: Insufficient documentation

## 2020-10-11 DIAGNOSIS — R319 Hematuria, unspecified: Secondary | ICD-10-CM | POA: Insufficient documentation

## 2020-10-11 DIAGNOSIS — D649 Anemia, unspecified: Secondary | ICD-10-CM | POA: Insufficient documentation

## 2020-10-11 DIAGNOSIS — N181 Chronic kidney disease, stage 1: Secondary | ICD-10-CM | POA: Insufficient documentation

## 2020-10-11 DIAGNOSIS — E46 Unspecified protein-calorie malnutrition: Secondary | ICD-10-CM | POA: Insufficient documentation

## 2020-10-11 DIAGNOSIS — I63512 Cerebral infarction due to unspecified occlusion or stenosis of left middle cerebral artery: Secondary | ICD-10-CM | POA: Insufficient documentation

## 2020-10-11 DIAGNOSIS — L409 Psoriasis, unspecified: Secondary | ICD-10-CM | POA: Insufficient documentation

## 2020-10-11 DIAGNOSIS — E559 Vitamin D deficiency, unspecified: Secondary | ICD-10-CM | POA: Insufficient documentation

## 2020-10-11 DIAGNOSIS — G4700 Insomnia, unspecified: Secondary | ICD-10-CM | POA: Insufficient documentation

## 2020-10-11 DIAGNOSIS — F325 Major depressive disorder, single episode, in full remission: Secondary | ICD-10-CM | POA: Insufficient documentation

## 2020-10-11 DIAGNOSIS — M47812 Spondylosis without myelopathy or radiculopathy, cervical region: Secondary | ICD-10-CM | POA: Insufficient documentation

## 2020-10-11 DIAGNOSIS — I6529 Occlusion and stenosis of unspecified carotid artery: Secondary | ICD-10-CM | POA: Insufficient documentation

## 2020-10-11 DIAGNOSIS — E049 Nontoxic goiter, unspecified: Secondary | ICD-10-CM | POA: Insufficient documentation

## 2020-10-15 ENCOUNTER — Other Ambulatory Visit: Payer: Self-pay

## 2020-11-02 ENCOUNTER — Ambulatory Visit: Payer: Medicare PPO | Admitting: Physician Assistant

## 2020-11-02 ENCOUNTER — Other Ambulatory Visit: Payer: Self-pay

## 2020-11-02 ENCOUNTER — Ambulatory Visit (HOSPITAL_COMMUNITY)
Admission: RE | Admit: 2020-11-02 | Discharge: 2020-11-02 | Disposition: A | Discharge status: Home / Self Care | Payer: Medicare PPO | Source: Ambulatory Visit | Attending: Vascular Surgery | Admitting: Vascular Surgery

## 2020-11-02 VITALS — BP 168/102 | HR 70 | Temp 97.4°F | Resp 14 | Ht 63.0 in | Wt 128.0 lb

## 2020-11-02 DIAGNOSIS — I6529 Occlusion and stenosis of unspecified carotid artery: Secondary | ICD-10-CM

## 2020-11-02 DIAGNOSIS — I720 Aneurysm of carotid artery: Secondary | ICD-10-CM

## 2020-11-02 NOTE — Progress Notes (Signed)
Established Carotid Patient   History of Present Illness   Ariel Lambert is a 85 y.o. ([birth year]) female who presents with chief complaint: carotid stenosis.  She is status post staged carotid endarterectomies in 2001 due to a TIA or CVA.  In January 2021 she was noted to have a right common carotid pseudoaneurysm which was repaired by Dr. Randie Heinz.  She states she cannot walk as well as she did prior to that surgery and feels she has not fully recovered since that time.  She denies one-sided weakness, slurring speech, or changes in vision however believes the surgery "took a lot out of her."  She is on a baby aspirin daily.  Current Outpatient Medications  Medication Sig Dispense Refill  . albuterol (PROVENTIL HFA;VENTOLIN HFA) 108 (90 BASE) MCG/ACT inhaler Inhale 2 puffs into the lungs every 6 (six) hours as needed. (Patient taking differently: Inhale 2 puffs into the lungs every 6 (six) hours as needed for wheezing or shortness of breath.) 1 Inhaler 6  . alendronate (FOSAMAX) 70 MG tablet Take 70 mg by mouth every Friday. Take with a full glass of water on an empty stomach.    Marland Kitchen aspirin EC 81 MG EC tablet Take 1 tablet (81 mg total) by mouth daily at 6 (six) AM.    . azelastine (ASTELIN) 0.1 % nasal spray Place 1 spray into both nostrils at bedtime. Use in each nostril as directed    . b complex vitamins tablet Take 1 tablet by mouth daily.    Marland Kitchen BIOTIN PO Take 1 tablet by mouth daily.    . calcium carbonate (OSCAL) 1500 (600 Ca) MG TABS tablet D 1 tablet with meals    . diphenhydrAMINE (BENADRYL) 25 MG tablet 1 tablet at bedtime as needed    . ezetimibe-simvastatin (VYTORIN) 10-20 MG per tablet Take 1 tablet by mouth daily after breakfast.    . fluticasone (FLONASE) 50 MCG/ACT nasal spray Place 1 spray into both nostrils daily.    Marland Kitchen ipratropium (ATROVENT) 0.03 % nasal spray Place 2 sprays into both nostrils 2 (two) times daily as needed for rhinitis.    Marland Kitchen levalbuterol (XOPENEX)  0.63 MG/3ML nebulizer solution Take 3 mLs (0.63 mg total) by nebulization every 4 (four) hours. (Patient taking differently: Take 0.63 mg by nebulization every 6 (six) hours as needed for wheezing or shortness of breath.) 3 mL 12  . levocetirizine (XYZAL) 5 MG tablet Take 5 mg by mouth at bedtime.    . methimazole (TAPAZOLE) 10 MG tablet TAKE 1 TABLET BY MOUTH EVERY DAY 90 tablet 1  . montelukast (SINGULAIR) 10 MG tablet Take 10 mg by mouth at bedtime.    . Multiple Vitamin (MULTIVITAMIN WITH MINERALS) TABS tablet Take 1 tablet by mouth daily.    . SYMBICORT 160-4.5 MCG/ACT inhaler Inhale 2 puffs into the lungs 2 (two) times daily.    Marland Kitchen telmisartan (MICARDIS) 80 MG tablet Take 40 mg by mouth 2 (two) times daily.     No current facility-administered medications for this visit.    REVIEW OF SYSTEMS (negative unless checked):   Cardiac:  []  Chest pain or chest pressure? []  Shortness of breath upon activity? []  Shortness of breath when lying flat? []  Irregular heart rhythm?  Vascular:  []  Pain in calf, thigh, or hip brought on by walking? []  Pain in feet at night that wakes you up from your sleep? []  Blood clot in your veins? []  Leg swelling?  Pulmonary:  []  Oxygen at  home? []  Productive cough? []  Wheezing?  Neurologic:  []  Sudden weakness in arms or legs? []  Sudden numbness in arms or legs? []  Sudden onset of difficult speaking or slurred speech? []  Temporary loss of vision in one eye? []  Problems with dizziness?  Gastrointestinal:  []  Blood in stool? []  Vomited blood?  Genitourinary:  []  Burning when urinating? []  Blood in urine?  Psychiatric:  []  Major depression  Hematologic:  []  Bleeding problems? []  Problems with blood clotting?  Dermatologic:  []  Rashes or ulcers?  Constitutional:  []  Fever or chills?  Ear/Nose/Throat:  []  Change in hearing? []  Nose bleeds? []  Sore throat?  Musculoskeletal:  []  Back pain? []  Joint pain? []  Muscle  pain?   Physical Examination   Vitals:   11/02/20 1350 11/02/20 1353  BP: (!) 160/97 (!) 168/102  Pulse: 69 70  Resp: 14   Temp: (!) 97.4 F (36.3 C)   TempSrc: Temporal   SpO2: 96%   Weight: 128 lb (58.1 kg)   Height: 5\' 3"  (1.6 m)    Body mass index is 22.67 kg/m.  General:  WDWN in NAD; vital signs documented above Gait: Not observed HENT: WNL, normocephalic Pulmonary: normal non-labored breathing  Cardiac: regular HR Abdomen: soft, NT, no masses Skin: without rashes Vascular Exam/Pulses:  Right Left  Radial 2+ (normal) 2+ (normal)  DP 2+ (normal) 2+ (normal)   Extremities: without ischemic changes, without Gangrene , without cellulitis; without open wounds;  Musculoskeletal: no muscle wasting or atrophy  Neurologic: A&O X 3;  No focal weakness or paresthesias are detected Psychiatric:  The pt has Normal affect.  Non-Invasive Vascular Imaging   B Carotid Duplex :   R ICA stenosis:  1-39%  R VA:  patent and antegrade  L ICA stenosis:  1-39%  L VA:  patent and antegrade   Medical Decision Making   Ariel Lambert is a 85 y.o. female who presents for surveillance of carotid artery stenosis.  Patient had staged carotid endarterectomies in 2001 and most recently had repair of pseudoaneurysm of right common carotid artery in January 2021   Carotid duplex demonstrates 1 to 39% stenosis of internal carotid arteries bilaterally; there is no evidence of pseudoaneurysm in right common carotid artery  Continue aspirin daily  Follow regularly with PCP for management of chronic medical conditions  Recheck carotid duplex in 1 year per protocol   PA-C [facility] Office: [phone]  Clinic MD: 

## 2020-12-01 DIAGNOSIS — T161XXA Foreign body in right ear, initial encounter: Secondary | ICD-10-CM | POA: Diagnosis not present

## 2020-12-01 DIAGNOSIS — S92514A Nondisplaced fracture of proximal phalanx of right lesser toe(s), initial encounter for closed fracture: Secondary | ICD-10-CM | POA: Diagnosis not present

## 2020-12-01 DIAGNOSIS — M79671 Pain in right foot: Secondary | ICD-10-CM | POA: Diagnosis not present

## 2020-12-03 DIAGNOSIS — S92591A Other fracture of right lesser toe(s), initial encounter for closed fracture: Secondary | ICD-10-CM | POA: Diagnosis not present

## 2020-12-03 DIAGNOSIS — H903 Sensorineural hearing loss, bilateral: Secondary | ICD-10-CM | POA: Diagnosis not present

## 2020-12-03 DIAGNOSIS — S01321A Laceration with foreign body of right ear, initial encounter: Secondary | ICD-10-CM | POA: Diagnosis not present

## 2020-12-03 DIAGNOSIS — Z974 Presence of external hearing-aid: Secondary | ICD-10-CM | POA: Diagnosis not present

## 2020-12-19 DIAGNOSIS — M79671 Pain in right foot: Secondary | ICD-10-CM | POA: Diagnosis not present

## 2020-12-20 DIAGNOSIS — I10 Essential (primary) hypertension: Secondary | ICD-10-CM | POA: Diagnosis not present

## 2020-12-20 DIAGNOSIS — I700 Atherosclerosis of aorta: Secondary | ICD-10-CM | POA: Diagnosis not present

## 2020-12-20 DIAGNOSIS — N3281 Overactive bladder: Secondary | ICD-10-CM | POA: Diagnosis not present

## 2020-12-20 DIAGNOSIS — N182 Chronic kidney disease, stage 2 (mild): Secondary | ICD-10-CM | POA: Diagnosis not present

## 2020-12-20 DIAGNOSIS — E049 Nontoxic goiter, unspecified: Secondary | ICD-10-CM | POA: Diagnosis not present

## 2020-12-20 DIAGNOSIS — Z0000 Encounter for general adult medical examination without abnormal findings: Secondary | ICD-10-CM | POA: Diagnosis not present

## 2020-12-20 DIAGNOSIS — E559 Vitamin D deficiency, unspecified: Secondary | ICD-10-CM | POA: Diagnosis not present

## 2020-12-20 DIAGNOSIS — Z23 Encounter for immunization: Secondary | ICD-10-CM | POA: Diagnosis not present

## 2020-12-20 DIAGNOSIS — Z79899 Other long term (current) drug therapy: Secondary | ICD-10-CM | POA: Diagnosis not present

## 2020-12-26 DIAGNOSIS — H903 Sensorineural hearing loss, bilateral: Secondary | ICD-10-CM | POA: Diagnosis not present

## 2021-01-09 ENCOUNTER — Encounter: Payer: Self-pay | Admitting: Endocrinology

## 2021-01-09 ENCOUNTER — Ambulatory Visit: Payer: Medicare PPO | Admitting: Endocrinology

## 2021-01-09 ENCOUNTER — Other Ambulatory Visit: Payer: Self-pay

## 2021-01-09 VITALS — BP 124/64 | HR 90 | Ht 63.0 in | Wt 132.0 lb

## 2021-01-09 DIAGNOSIS — E0520 Thyrotoxicosis with toxic multinodular goiter without thyrotoxic crisis or storm: Secondary | ICD-10-CM

## 2021-01-09 NOTE — Patient Instructions (Addendum)
Please continue the same methimazole.    ?If ever you have fever while taking methimazole, stop it and call us, even if the reason is obvious, because of the risk of a rare side-effect.   ?It is best to never miss the medication.  However, if you do miss it, next best is to double up the next time.   ?Please come back for a follow-up appointment in 6 months.   ?

## 2021-01-09 NOTE — Progress Notes (Signed)
Subjective:    Patient ID: Ariel Lambert, female    DOB: 1936-05-04, 85 y.o.   MRN: [id number]  HPI Pt returns for f/u of hyperthyroidism due to small multinodular goiter (dx'ed approx 1999; she chose chronic tapazole rx; most recent US was in 2015--no change; it has been well-controlled with tapazole; in 2020, she had neck mass which proved to be carotid pseudoaneurysm, which was repaired in early 2021). She alternates tapazole, 1 pill and 1/2 pill, each day.  pt states she feels well in general.  Denies neck swelling or pain.  Past Medical History:  Diagnosis Date  . Allergy   . Arthritis   . ASTHMA 05/03/2009  . Asthma   . Chronic kidney disease    " in remission"  . CVA (cerebral infarction)   . Dyslipidemia   . Headache   . Hearing aid worn    B/L  . HYPERTENSION 04/30/2007  . HYPERTHYROIDISM 06/17/2007  . Menopause   . Multinodular goiter   . OSTEOPOROSIS 04/30/2007  . Wears glasses     Past Surgical History:  Procedure Laterality Date  . BACK SURGERY    . BREAST EXCISIONAL BIOPSY Left   . carotid artery surgery    . CATARACT EXTRACTION W/ INTRAOCULAR LENS  IMPLANT, BILATERAL    . ENDARTERECTOMY Right 09/29/2019   Procedure: REDO OF RIGHT ENDARTERECTOMY CAROTID;  Surgeon: Maeola Harman, MD;  Location: [facility] OR;  Service: Vascular;  Laterality: Right;  . NECK SURGERY    . OTHER SURGICAL HISTORY     discectomy    Social History   Socioeconomic History  . Marital status: Widowed    Spouse name: Not on file  . Number of children: Not on file  . Years of education: Not on file  . Highest education level: Not on file  Occupational History  . Occupation: Magazine features editor: RETIRED  Tobacco Use  . Smoking status: Former Smoker    Types: Cigarettes  . Smokeless tobacco: Never Used  . Tobacco comment: "Quit smoking cigarettes in my 40's "  Vaping Use  . Vaping Use: Never used  Substance and Sexual Activity  . Alcohol use: Yes    Alcohol/week:  0.0 standard drinks    Comment: Socially  . Drug use: No  . Sexual activity: Not on file  Other Topics Concern  . Not on file  Social History Narrative   Lives alone.   Social Determinants of Health   Financial Resource Strain: Not on file  Food Insecurity: Not on file  Transportation Needs: Not on file  Physical Activity: Not on file  Stress: Not on file  Social Connections: Not on file  Intimate Partner Violence: Not on file    Current Outpatient Medications on File Prior to Visit  Medication Sig Dispense Refill  . albuterol (PROVENTIL HFA;VENTOLIN HFA) 108 (90 BASE) MCG/ACT inhaler Inhale 2 puffs into the lungs every 6 (six) hours as needed. (Patient taking differently: Inhale 2 puffs into the lungs every 6 (six) hours as needed for wheezing or shortness of breath.) 1 Inhaler 6  . alendronate (FOSAMAX) 70 MG tablet Take 70 mg by mouth every Friday. Take with a full glass of water on an empty stomach.    Marland Kitchen aspirin EC 81 MG EC tablet Take 1 tablet (81 mg total) by mouth daily at 6 (six) AM.    . azelastine (ASTELIN) 0.1 % nasal spray Place 1 spray into both nostrils at bedtime. Use in each  nostril as directed    . b complex vitamins tablet Take 1 tablet by mouth daily.    Marland Kitchen BIOTIN PO Take 1 tablet by mouth daily.    . calcium carbonate (OSCAL) 1500 (600 Ca) MG TABS tablet D 1 tablet with meals    . diphenhydrAMINE (BENADRYL) 25 MG tablet 1 tablet at bedtime as needed    . ezetimibe-simvastatin (VYTORIN) 10-20 MG per tablet Take 1 tablet by mouth daily after breakfast.    . fluticasone (FLONASE) 50 MCG/ACT nasal spray Place 1 spray into both nostrils daily.    Marland Kitchen ipratropium (ATROVENT) 0.03 % nasal spray Place 2 sprays into both nostrils 2 (two) times daily as needed for rhinitis.    Marland Kitchen levalbuterol (XOPENEX) 0.63 MG/3ML nebulizer solution Take 3 mLs (0.63 mg total) by nebulization every 4 (four) hours. (Patient taking differently: Take 0.63 mg by nebulization every 6 (six) hours as  needed for wheezing or shortness of breath.) 3 mL 12  . levocetirizine (XYZAL) 5 MG tablet Take 5 mg by mouth at bedtime.    . methimazole (TAPAZOLE) 10 MG tablet TAKE 1 TABLET BY MOUTH EVERY DAY 90 tablet 1  . montelukast (SINGULAIR) 10 MG tablet Take 10 mg by mouth at bedtime.    . Multiple Vitamin (MULTIVITAMIN WITH MINERALS) TABS tablet Take 1 tablet by mouth daily.    . SYMBICORT 160-4.5 MCG/ACT inhaler Inhale 2 puffs into the lungs 2 (two) times daily.    Marland Kitchen telmisartan (MICARDIS) 80 MG tablet Take 40 mg by mouth 2 (two) times daily.     No current facility-administered medications on file prior to visit.    Allergies  Allergen Reactions  . Cetirizine   . Cetirizine & Related Other (See Comments)    Stomach pains  . Codeine Itching    Severe itching  . Meperidine Diarrhea and Nausea And Vomiting  . Meperidine Hcl Diarrhea and Nausea And Vomiting    (Demerol)  . Nabumetone Rash    (Relafen)  . Naproxen Itching and Rash  . Sulfa Antibiotics Rash  . Sulfonamide Derivatives Rash    Family History  Problem Relation Age of Onset  . Heart disease Other   . Breast cancer Mother 60    BP 124/64   Pulse 90   Ht 5\' 3"  (1.6 m)   Wt 132 lb (59.9 kg)   SpO2 94%   BMI 23.38 kg/m    Review of Systems Denies fever.      Objective:   Physical Exam VITAL SIGNS:  See vs page GENERAL: no distress NECK: thyroid has irreg surface.    outside test results are reviewed:  TSH=0.8     Assessment & Plan:  Hyperthyroidism: well-controlled.  Please continue the same tapazole.

## 2021-01-30 DIAGNOSIS — M170 Bilateral primary osteoarthritis of knee: Secondary | ICD-10-CM | POA: Diagnosis not present

## 2021-03-14 DIAGNOSIS — M170 Bilateral primary osteoarthritis of knee: Secondary | ICD-10-CM | POA: Diagnosis not present

## 2021-03-15 ENCOUNTER — Other Ambulatory Visit: Payer: Self-pay | Admitting: Family Medicine

## 2021-03-15 DIAGNOSIS — Z1231 Encounter for screening mammogram for malignant neoplasm of breast: Secondary | ICD-10-CM

## 2021-03-19 DIAGNOSIS — M170 Bilateral primary osteoarthritis of knee: Secondary | ICD-10-CM | POA: Diagnosis not present

## 2021-03-27 DIAGNOSIS — M1712 Unilateral primary osteoarthritis, left knee: Secondary | ICD-10-CM | POA: Diagnosis not present

## 2021-03-27 DIAGNOSIS — M1711 Unilateral primary osteoarthritis, right knee: Secondary | ICD-10-CM | POA: Diagnosis not present

## 2021-03-27 DIAGNOSIS — M170 Bilateral primary osteoarthritis of knee: Secondary | ICD-10-CM | POA: Diagnosis not present

## 2021-04-08 ENCOUNTER — Ambulatory Visit: Payer: Medicare PPO | Admitting: Internal Medicine

## 2021-04-15 DIAGNOSIS — M25562 Pain in left knee: Secondary | ICD-10-CM | POA: Diagnosis not present

## 2021-04-15 DIAGNOSIS — M25561 Pain in right knee: Secondary | ICD-10-CM | POA: Diagnosis not present

## 2021-04-23 DIAGNOSIS — M25561 Pain in right knee: Secondary | ICD-10-CM | POA: Diagnosis not present

## 2021-04-23 DIAGNOSIS — M25562 Pain in left knee: Secondary | ICD-10-CM | POA: Diagnosis not present

## 2021-04-25 DIAGNOSIS — M25561 Pain in right knee: Secondary | ICD-10-CM | POA: Diagnosis not present

## 2021-04-25 DIAGNOSIS — M25562 Pain in left knee: Secondary | ICD-10-CM | POA: Diagnosis not present

## 2021-04-29 DIAGNOSIS — M25561 Pain in right knee: Secondary | ICD-10-CM | POA: Diagnosis not present

## 2021-04-29 DIAGNOSIS — M25562 Pain in left knee: Secondary | ICD-10-CM | POA: Diagnosis not present

## 2021-05-01 DIAGNOSIS — M25561 Pain in right knee: Secondary | ICD-10-CM | POA: Diagnosis not present

## 2021-05-01 DIAGNOSIS — M25562 Pain in left knee: Secondary | ICD-10-CM | POA: Diagnosis not present

## 2021-05-06 DIAGNOSIS — M25561 Pain in right knee: Secondary | ICD-10-CM | POA: Diagnosis not present

## 2021-05-06 DIAGNOSIS — M25562 Pain in left knee: Secondary | ICD-10-CM | POA: Diagnosis not present

## 2021-05-07 ENCOUNTER — Other Ambulatory Visit: Payer: Self-pay

## 2021-05-07 ENCOUNTER — Ambulatory Visit: Payer: Medicare PPO | Admitting: Internal Medicine

## 2021-05-07 ENCOUNTER — Encounter: Payer: Self-pay | Admitting: Internal Medicine

## 2021-05-07 VITALS — BP 130/68 | HR 67 | Ht 63.0 in | Wt 132.0 lb

## 2021-05-07 DIAGNOSIS — I77810 Thoracic aortic ectasia: Secondary | ICD-10-CM | POA: Diagnosis not present

## 2021-05-07 DIAGNOSIS — G459 Transient cerebral ischemic attack, unspecified: Secondary | ICD-10-CM | POA: Diagnosis not present

## 2021-05-07 DIAGNOSIS — I10 Essential (primary) hypertension: Secondary | ICD-10-CM | POA: Diagnosis not present

## 2021-05-07 DIAGNOSIS — I6529 Occlusion and stenosis of unspecified carotid artery: Secondary | ICD-10-CM

## 2021-05-07 DIAGNOSIS — Z01810 Encounter for preprocedural cardiovascular examination: Secondary | ICD-10-CM | POA: Diagnosis not present

## 2021-05-07 DIAGNOSIS — R9431 Abnormal electrocardiogram [ECG] [EKG]: Secondary | ICD-10-CM | POA: Diagnosis not present

## 2021-05-07 NOTE — Progress Notes (Signed)
Cardiology Office Note:    Date:  05/07/2021   ID:  Ariel Lambert, DOB 09/01/1936, MRN [id number]  PCP:  Mila Palmer, MD  Cardiologist:  Parke Poisson, MD  Electrophysiologist:  None   Referring MD: Mila Palmer, MD   Chief Complaint/Reason for Referral: Carotid artery disease, CVA, preoperative cardiovascular risk assessment  History of Present Illness:    Ariel Lambert is a 85 y.o. female with a history of bilateral carotid endarterectomy in 2001. I last saw her in April 2021 for after repair of pseudoaneurysm off the inferior aspect of the previous patch angioplasty of the carotid endarterectomy on the right.  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   She feels well overall.  She is still having balance issues after surgery.  She has participated in physical therapy which is helped a little but has not resolved the issue.  She now walks with a cane for support.  She feels much of her balance issues are attributable to knee osteoarthritis, and has an upcoming appointment for consideration of knee replacement.  This visit will then also serve as a preoperative cardiovascular risk assessment.  Blood pressure appears much better than prior visit.  She is not quite able to complete 4 METS without the assistance of her gait aid, but she denies chest pain or shortness of breath with her activities.  Denies palpitations.  Denies dizziness but does have balance issues.  Denies syncope or presyncope.   Past Medical History:  Diagnosis Date   Allergy    Arthritis    ASTHMA 05/03/2009   Asthma    Chronic kidney disease    " in remission"   CVA (cerebral infarction)    Dyslipidemia    Headache    Hearing aid worn    B/L   HYPERTENSION 04/30/2007   HYPERTHYROIDISM 06/17/2007   Menopause    Multinodular goiter    OSTEOPOROSIS 04/30/2007   Wears glasses     Past Surgical  History:  Procedure Laterality Date   BACK SURGERY     BREAST EXCISIONAL BIOPSY Left    carotid artery surgery     CATARACT EXTRACTION W/ INTRAOCULAR LENS  IMPLANT, BILATERAL     ENDARTERECTOMY Right 09/29/2019   Procedure: REDO OF RIGHT ENDARTERECTOMY CAROTID;  Surgeon: Maeola Harman, MD;  Location: [facility] OR;  Service: Vascular;  Laterality: Right;   NECK SURGERY     OTHER SURGICAL HISTORY     discectomy    Current Medications: Current Meds  Medication Sig   albuterol (PROVENTIL HFA;VENTOLIN HFA) 108 (90 BASE) MCG/ACT inhaler Inhale 2 puffs into the lungs every 6 (six) hours as needed. (Patient taking differently: Inhale 2 puffs into the lungs every 6 (six) hours as needed for wheezing or shortness of breath.)   alendronate (FOSAMAX) 70 MG tablet Take 70 mg by mouth every Friday. Take with a full glass of water on an empty stomach.   aspirin EC 81 MG EC tablet Take 1 tablet (81 mg total) by mouth daily at 6 (six) AM.   azelastine (ASTELIN) 0.1 % nasal spray Place 1 spray into both nostrils at bedtime. Use in each nostril as directed   b complex vitamins tablet Take 1 tablet by mouth daily.   BIOTIN PO Take 1 tablet by mouth daily.   calcium carbonate (OSCAL) 1500 (600 Ca) MG TABS tablet D 1 tablet with meals   diphenhydrAMINE (BENADRYL) 25 MG  tablet 1 tablet at bedtime as needed   ezetimibe-simvastatin (VYTORIN) 10-20 MG per tablet Take 1 tablet by mouth daily after breakfast.   fluticasone (FLONASE) 50 MCG/ACT nasal spray Place 1 spray into both nostrils daily.   ipratropium (ATROVENT) 0.03 % nasal spray Place 2 sprays into both nostrils 2 (two) times daily as needed for rhinitis.   levalbuterol (XOPENEX) 0.63 MG/3ML nebulizer solution Take 3 mLs (0.63 mg total) by nebulization every 4 (four) hours. (Patient taking differently: Take 0.63 mg by nebulization every 6 (six) hours as needed for wheezing or shortness of breath.)   levocetirizine (XYZAL) 5 MG tablet Take 5 mg by mouth  at bedtime.   methimazole (TAPAZOLE) 10 MG tablet TAKE 1 TABLET BY MOUTH EVERY DAY   montelukast (SINGULAIR) 10 MG tablet Take 10 mg by mouth at bedtime.   Multiple Vitamin (MULTIVITAMIN WITH MINERALS) TABS tablet Take 1 tablet by mouth daily.   SYMBICORT 160-4.5 MCG/ACT inhaler Inhale 2 puffs into the lungs 2 (two) times daily.   telmisartan (MICARDIS) 80 MG tablet Take 40 mg by mouth 2 (two) times daily.     Allergies:   Cetirizine, Cetirizine & related, Codeine, Meperidine, Meperidine hcl, Nabumetone, Naproxen, Sulfa antibiotics, and Sulfonamide derivatives   Social History   Tobacco Use   Smoking status: Former    Types: Cigarettes   Smokeless tobacco: Never   Tobacco comments:    "Quit smoking cigarettes in my 40's "  Vaping Use   Vaping Use: Never used  Substance Use Topics   Alcohol use: Yes    Alcohol/week: 0.0 standard drinks    Comment: Socially   Drug use: No     Family History: The patient's family history includes Breast cancer (age of onset: 72) in her mother; Heart disease in an other family member.  ROS:   Please see the history of present illness.    All other systems reviewed and are negative.  EKGs/Labs/Other Studies Reviewed:    The following studies were reviewed today:  EKG: 05/07/21: Ectopic atrial rhythm, iRBBB, LVH, septal infarct pattern. 09/18/20: Ectopic atrial rhythm, left ventricular hypertrophy, nonspecific ST abnormality  Recent Labs: 07/11/2020: TSH 1.73  Recent Lipid Panel No results found for: CHOL, TRIG, HDL, CHOLHDL, VLDL, LDLCALC, LDLDIRECT  Physical Exam:    VS:  BP 130/68   Pulse 67   Ht 5\' 3"  (1.6 m)   Wt 132 lb (59.9 kg)   SpO2 96%   BMI 23.38 kg/m     Wt Readings from Last 5 Encounters:  05/07/21 132 lb (59.9 kg)  01/09/21 132 lb (59.9 kg)  11/02/20 128 lb (58.1 kg)  09/18/20 132 lb (59.9 kg)  07/11/20 127 lb (57.6 kg)    Constitutional: No acute distress Eyes: sclera non-icteric, normal conjunctiva and  lids ENMT: normal dentition, moist mucous membranes Cardiovascular: regular rhythm, normal rate, soft systolic murmur. S1 and S2 normal. Radial pulses normal bilaterally. No jugular venous distention.  Respiratory: clear to auscultation bilaterally GI : normal bowel sounds, soft and nontender. No distention.   MSK: extremities warm, well perfused. No edema.  NEURO: grossly nonfocal exam, moves all extremities. PSYCH: alert and oriented x 3, normal mood and affect.    ASSESSMENT:    1. Essential hypertension   2. TIA (transient ischemic attack)   3. Stenosis of carotid artery, unspecified laterality   4. Preoperative cardiovascular examination   5. Abnormal EKG   6. Ascending aorta dilatation (HCC)    PLAN:    Essential  hypertension - Plan: EKG 12-Lead -Recommend continuing telmisartan 80 mg daily.  BP reading better today than last visit.  TIA (transient ischemic attack) - Plan: ECHOCARDIOGRAM COMPLETE -No worrisome findings on echocardiogram from January 2022 as a source of TIA.  Abnormal EKG -Ectopic atrial rhythm, stable compared to prior.  Preoperative cardiovascular risk stratification - The patient is intermediate risk for intermediate risk procedure.  Primary contributors to risk include age, moderate frailty, hypertension, prior TIA, and carotid artery disease.  She is not having chest pain or shortness of breath and recently has had a stable echocardiogram.  Therefore, no further cardiovascular testing is required prior to knee replacement surgery.  If this level of risk is acceptable to the patient and surgical team, the patient should be considered optimized from a cardiovascular standpoint.  Patient understands her risk categorization and contributing factors, and is willing to proceed.  Ascending aorta dilation, mild -Patient is noted to have a 40 mm ascending aorta on recent echocardiogram. We will follow-up with echocardiogram.  We have discussed that since she had an  echo this year, we can wait until our follow-up appointment next year to reassess ascending aorta dilation as well as her mild mitral valve regurgitation.  She is in agreement with this plan.  Total time of encounter: 30 minutes total time of encounter, including 20 minutes spent in face-to-face patient care on the date of this encounter. This time includes coordination of care and counseling regarding above mentioned problem list. Remainder of non-face-to-face time involved reviewing chart documents/testing relevant to the patient encounter and documentation in the medical record. I have independently reviewed documentation from referring provider.   Weston Brass, MD, [facility]  [facility]    Medication Adjustments/Labs and Tests Ordered: Current medicines are reviewed at length with the patient today.  Concerns regarding medicines are outlined above.   Orders Placed This Encounter  Procedures   EKG 12-Lead   No orders of the defined types were placed in this encounter.   Patient Instructions  Medication Instructions:  No Changes In Medications at this time.  *If you need a refill on your cardiac medications before your next appointment, please call your pharmacy*  Follow-Up: At [facility], you and your health needs are our priority.  As part of our continuing mission to provide you with exceptional heart care, we have created designated Provider Care Teams.  These Care Teams include your primary Cardiologist (physician) and Advanced Practice Providers (APPs -  Physician Assistants and Nurse Practitioners) who all work together to provide you with the care you need, when you need it.  Your next appointment:   12 month(s)  The format for your next appointment:   In Person  Provider:   Weston Brass, MD

## 2021-05-07 NOTE — Patient Instructions (Signed)
Medication Instructions:  No Changes In Medications at this time.  *If you need a refill on your cardiac medications before your next appointment, please call your pharmacy*  Follow-Up: At [facility], you and your health needs are our priority.  As part of our continuing mission to provide you with exceptional heart care, we have created designated Provider Care Teams.  These Care Teams include your primary Cardiologist (physician) and Advanced Practice Providers (APPs -  Physician Assistants and Nurse Practitioners) who all work together to provide you with the care you need, when you need it.  Your next appointment:   12 month(s)  The format for your next appointment:   In Person  Provider:   Weston Brass, MD

## 2021-05-08 DIAGNOSIS — M25561 Pain in right knee: Secondary | ICD-10-CM | POA: Diagnosis not present

## 2021-05-08 DIAGNOSIS — M25562 Pain in left knee: Secondary | ICD-10-CM | POA: Diagnosis not present

## 2021-05-09 ENCOUNTER — Other Ambulatory Visit: Payer: Self-pay

## 2021-05-09 ENCOUNTER — Ambulatory Visit
Admission: RE | Admit: 2021-05-09 | Discharge: 2021-05-09 | Disposition: A | Discharge status: Home / Self Care | Payer: Medicare PPO | Source: Ambulatory Visit | Attending: Family Medicine | Admitting: Family Medicine

## 2021-05-09 DIAGNOSIS — M25562 Pain in left knee: Secondary | ICD-10-CM | POA: Diagnosis not present

## 2021-05-09 DIAGNOSIS — Z1231 Encounter for screening mammogram for malignant neoplasm of breast: Secondary | ICD-10-CM

## 2021-05-09 DIAGNOSIS — M25561 Pain in right knee: Secondary | ICD-10-CM | POA: Diagnosis not present

## 2021-05-13 DIAGNOSIS — M25562 Pain in left knee: Secondary | ICD-10-CM | POA: Diagnosis not present

## 2021-05-13 DIAGNOSIS — M25561 Pain in right knee: Secondary | ICD-10-CM | POA: Diagnosis not present

## 2021-05-15 ENCOUNTER — Telehealth: Payer: Self-pay

## 2021-05-15 DIAGNOSIS — M25561 Pain in right knee: Secondary | ICD-10-CM | POA: Diagnosis not present

## 2021-05-15 DIAGNOSIS — M25562 Pain in left knee: Secondary | ICD-10-CM | POA: Diagnosis not present

## 2021-05-15 NOTE — Telephone Encounter (Signed)
   [facility] Pre-operative Risk Assessment    Patient Name: Ariel Lambert  DOB: [birth year] MRN: [id number]  HEARTCARE STAFF:  - IMPORTANT!!!!!! Under Visit Info/Reason for Call, type in Other and utilize the format Clearance MM/DD/YY or Clearance TBD. Do not use dashes or single digits. - Please review there is not already an duplicate clearance open for this procedure. - If request is for dental extraction, please clarify the # of teeth to be extracted. - If the patient is currently at the dentist's office, call Pre-Op Callback Staff (MA/nurse) to input urgent request.  - If the patient is not currently in the dentist office, please route to the Pre-Op pool.  Request for surgical clearance:  What type of surgery is being performed? Left Total Knee Arthoplasty   When is this surgery scheduled? 08/12/21  What type of clearance is required (medical clearance vs. Pharmacy clearance to hold med vs. Both)? Medical   Are there any medications that need to be held prior to surgery and how long? None  Practice name and name of physician performing surgery? [facility] Dr. Pilar Plate Aluisio  What is the office phone number? [phone] ATTN. KELLY HANCOCK   7.   What is the office fax number? [phone]  8.   Anesthesia type (None, local, MAC, general) ? Choice   Ariel Lambert 05/15/2021, 10:04 AM  _________________________________________________________________   (provider comments below)

## 2021-05-15 NOTE — Telephone Encounter (Signed)
I have faxed Dr. Lupe Carney clinic note from 05/07/21.

## 2021-05-22 DIAGNOSIS — M25561 Pain in right knee: Secondary | ICD-10-CM | POA: Diagnosis not present

## 2021-06-05 DIAGNOSIS — Z23 Encounter for immunization: Secondary | ICD-10-CM | POA: Diagnosis not present

## 2021-06-21 ENCOUNTER — Telehealth: Payer: Self-pay

## 2021-06-21 NOTE — Telephone Encounter (Signed)
Patient was last seen by Dr. Jacques Navy in August and was cleared for the same procedure.  We have received another cardiac clearance for this.  I am not sure if this is because the last cardiac clearance was 2 months ago.  I have left the patient a message for her to call back and I discussed her symptom with preop APP of today so we can hopefully clear her over the phone.  She is not on aspirin for cardiac reasons.  She has a history of carotid enterectomy, I suspect she is on aspirin for this.  Aspirin holding time will need to be addressed either by her vascular doctor or her primary care provider.

## 2021-06-21 NOTE — Telephone Encounter (Signed)
   [facility] Pre-operative Risk Assessment    Patient Name: Ariel Lambert  DOB: 16-May-1936 MRN: [id number]  HEARTCARE STAFF:   Request for surgical clearance:  What type of surgery is being performed Left total knee arthroplasty   When is this surgery scheduled  09/30/21  What type of clearance is required Both  Are there any medications that need to be held prior to surgery and how long   Aspirin  Practice name and name of physician performing surgery  [facility]  Dr.Frank Aluisio   What is the office phone number [phone]   7.   What is the office fax number        [phone]  8.   Anesthesia type Choice   Neoma Laming 06/21/2021, 9:56 AM  _________________________________________________________________   (provider comments below)

## 2021-06-25 NOTE — Telephone Encounter (Signed)
   Name: Ariel Lambert  DOB: 1936/04/07  MRN: [id number]   Primary Cardiologist: Parke Poisson, MD  Chart reviewed as part of pre-operative protocol coverage. Patient was contacted 06/25/2021 in reference to pre-operative risk assessment for pending surgery as outlined below.  Ariel Lambert was last seen on 05/07/2021 by Dr. Jacques Navy.  Since that day, Ariel Lambert has done well without chest pain or worsening dyspnea.  Patient was previously cleared by Dr. Jacques Navy during previous office visit on 05/07/2021 for the same procedure.  Therefore, based on ACC/AHA guidelines, the patient would be at acceptable risk for the planned procedure without further cardiovascular testing.   The patient was advised that if she develops new symptoms prior to surgery to contact our office to arrange for a follow-up visit, and she verbalized understanding.  I will route this recommendation to the requesting party via Epic fax function and remove from pre-op pool. Please call with questions.  [place], [place] 06/25/2021, 11:50 AM

## 2021-07-16 DIAGNOSIS — I10 Essential (primary) hypertension: Secondary | ICD-10-CM | POA: Diagnosis not present

## 2021-07-16 DIAGNOSIS — Z01818 Encounter for other preprocedural examination: Secondary | ICD-10-CM | POA: Diagnosis not present

## 2021-07-25 ENCOUNTER — Other Ambulatory Visit: Payer: Self-pay | Admitting: Endocrinology

## 2021-08-02 ENCOUNTER — Other Ambulatory Visit (HOSPITAL_COMMUNITY): Payer: Medicare PPO

## 2021-08-12 ENCOUNTER — Ambulatory Visit: Admit: 2021-08-12 | Payer: Medicare PPO | Admitting: Orthopedic Surgery

## 2021-08-12 SURGERY — ARTHROPLASTY, KNEE, TOTAL
Anesthesia: Choice | Site: Knee | Laterality: Left

## 2021-08-14 DIAGNOSIS — I10 Essential (primary) hypertension: Secondary | ICD-10-CM | POA: Diagnosis not present

## 2021-08-21 DIAGNOSIS — L309 Dermatitis, unspecified: Secondary | ICD-10-CM | POA: Diagnosis not present

## 2021-08-21 DIAGNOSIS — J301 Allergic rhinitis due to pollen: Secondary | ICD-10-CM | POA: Diagnosis not present

## 2021-08-21 DIAGNOSIS — J3089 Other allergic rhinitis: Secondary | ICD-10-CM | POA: Diagnosis not present

## 2021-08-21 DIAGNOSIS — J449 Chronic obstructive pulmonary disease, unspecified: Secondary | ICD-10-CM | POA: Diagnosis not present

## 2021-08-27 DIAGNOSIS — Z961 Presence of intraocular lens: Secondary | ICD-10-CM | POA: Diagnosis not present

## 2021-08-27 DIAGNOSIS — H52203 Unspecified astigmatism, bilateral: Secondary | ICD-10-CM | POA: Diagnosis not present

## 2021-09-12 ENCOUNTER — Encounter (HOSPITAL_COMMUNITY): Payer: Self-pay

## 2021-09-12 NOTE — Patient Instructions (Addendum)
DUE TO COVID-19 ONLY ONE VISITOR IS ALLOWED TO COME WITH YOU AND STAY IN THE WAITING ROOM ONLY DURING PRE OP AND PROCEDURE.   **NO VISITORS ARE ALLOWED IN THE SHORT STAY AREA OR RECOVERY ROOM!!**  IF YOU WILL BE ADMITTED INTO THE HOSPITAL YOU ARE ALLOWED ONLY TWO SUPPORT PEOPLE DURING VISITATION HOURS ONLY (7 AM -8PM)    Up to two visitors ages 39+ are allowed at one time in a patient's room.  The visitors may rotate out with other people throughout the day.  Additionally, up to two children between the ages of 43 and 57 are allowed and do not count toward the number of allowed visitors.  Children within this age range must be accompanied by an adult visitor.  One adult visitor may remain with the patient overnight and must be in the room by 8 PM.  COVID SWAB TESTING MUST BE COMPLETED ON:  09-26-21 @ 9:30 AM  COME IN THROUGH MAIN ENTRANCE of [facility].  Take a seat in the lobby area to the right as you come in the main entrance.  Call [phone]  and give your name and let them know you are here for COVID testing  You are not required to quarantine, however you are required to wear a well-fitted mask when you are out and around people not in your household.  Hand Hygiene often Do NOT share personal items Notify your provider if you are in close contact with someone who has COVID or you develop fever 100.4 or greater, new onset of sneezing, cough, sore throat, shortness of breath or body aches.       Your procedure is scheduled on:  Monday, 09-30-21   Report to [facility] Main  Entrance     Report to admitting at 11:00 AM   Call this number if you have problems the morning of surgery [phone]   Do not eat food :After Midnight.   May have liquids until 10:45 AM day of surgery  CLEAR LIQUID DIET  Foods Allowed                                                                     Foods Excluded  Water, Black Coffee (no milk/no creamer) and tea, regular and decaf                               liquids that you cannot  Plain Jell-O in any flavor  (No red)                         see through such as: Fruit ices (not with fruit pulp)                                 milk, soups, orange juice  Iced Popsicles (No red)                                    All solid food  Apple juices Sports drinks like Gatorade (No red) Lightly seasoned clear broth or consume(fat free) Sugar   Complete one Ensure drink the morning of surgery at 10:45 AM the day of surgery.     The day of surgery:  Drink ONE (1) Pre-Surgery Clear Ensure the morning of surgery. Drink in one sitting. Do not sip.  This drink was given to you during your hospital  pre-op appointment visit. Nothing else to drink after completing the Pre-Surgery Clear Ensure           If you have questions, please contact your surgeons office.     Oral Hygiene is also important to reduce your risk of infection.                                    Remember - BRUSH YOUR TEETH THE MORNING OF SURGERY WITH YOUR REGULAR TOOTHPASTE   Do NOT smoke after Midnight  Take these medicines the morning of surgery with A SIP OF WATER:  Vytorin (Ezetimbe), Methimazole. Okay to use inhalers, nebulizer and nasal sprays              Aspirin - stop 5 days prior to surgery   Stop all vitamins and herbal supplements a week before surgery             You may not have any metal on your body including hair pins, jewelry, and body piercing             Do not wear make-up, lotions, powders, perfumes or deodorant  Do not wear nail polish including gel and S&S, artificial/acrylic nails, or any other type of covering on natural nails including finger and toenails. If you have artificial nails, gel coating, etc. that needs to be removed by a nail salon please have this removed prior to surgery or surgery may need to be canceled/ delayed if the surgeon/ anesthesia feels like they are unable to be safely monitored.   Do  not shave  48 hours prior to surgery.           Do not bring valuables to the hospital. [facility] IS NOT RESPONSIBLE FOR VALUABLES.   Contacts, dentures or bridgework may not be worn into surgery.   Bring small overnight bag day of surgery.  Special Instructions: Bring a copy of your healthcare power of attorney and living will documents the day of surgery if you haven't scanned them in before.  Please read over the following fact sheets you were given: IF YOU HAVE QUESTIONS ABOUT YOUR PRE OP INSTRUCTIONS PLEASE CALL [phone] [facility] - Preparing for Surgery Before surgery, you can play an important role.  Because skin is not sterile, your skin needs to be as free of germs as possible.  You can reduce the number of germs on your skin by washing with CHG (chlorahexidine gluconate) soap before surgery.  CHG is an antiseptic cleaner which kills germs and bonds with the skin to continue killing germs even after washing. Please DO NOT use if you have an allergy to CHG or antibacterial soaps.  If your skin becomes reddened/irritated stop using the CHG and inform your nurse when you arrive at Short Stay. Do not shave (including legs and underarms) for at least 48 hours prior to the first CHG shower.  You may shave your face/neck.  Please follow these instructions carefully:  1.  Shower with  CHG Soap the night before surgery and the  morning of surgery.  2.  If you choose to wash your hair, wash your hair first as usual with your normal  shampoo.  3.  After you shampoo, rinse your hair and body thoroughly to remove the shampoo.                             4.  Use CHG as you would any other liquid soap.  You can apply chg directly to the skin and wash.  Gently with a scrungie or clean washcloth.  5.  Apply the CHG Soap to your body ONLY FROM THE NECK DOWN.   Do   not use on face/ open                           Wound or open sores. Avoid contact with eyes, ears mouth and   genitals  (private parts).                       Wash face,  Genitals (private parts) with your normal soap.             6.  Wash thoroughly, paying special attention to the area where your    surgery  will be performed.  7.  Thoroughly rinse your body with warm water from the neck down.  8.  DO NOT shower/wash with your normal soap after using and rinsing off the CHG Soap.                9.  Pat yourself dry with a clean towel.            10.  Wear clean pajamas.            11.  Place clean sheets on your bed the night of your first shower and do not  sleep with pets. Day of Surgery : Do not apply any lotions/deodorants the morning of surgery.  Please wear clean clothes to the hospital/surgery center.  FAILURE TO FOLLOW THESE INSTRUCTIONS MAY RESULT IN THE CANCELLATION OF YOUR SURGERY  PATIENT SIGNATURE_________________________________  NURSE SIGNATURE__________________________________  ________________________________________________________________________   Rogelia Mire  An incentive spirometer is a tool that can help keep your lungs clear and active. This tool measures how well you are filling your lungs with each breath. Taking long deep breaths may help reverse or decrease the chance of developing breathing (pulmonary) problems (especially infection) following: A long period of time when you are unable to move or be active. BEFORE THE PROCEDURE  If the spirometer includes an indicator to show your best effort, your nurse or respiratory therapist will set it to a desired goal. If possible, sit up straight or lean slightly forward. Try not to slouch. Hold the incentive spirometer in an upright position. INSTRUCTIONS FOR USE  Sit on the edge of your bed if possible, or sit up as far as you can in bed or on a chair. Hold the incentive spirometer in an upright position. Breathe out normally. Place the mouthpiece in your mouth and seal your lips tightly around it. Breathe in slowly and  as deeply as possible, raising the piston or the ball toward the top of the column. Hold your breath for 3-5 seconds or for as long as possible. Allow the piston or ball to fall to the bottom of the column. Remove the  mouthpiece from your mouth and breathe out normally. Rest for a few seconds and repeat Steps 1 through 7 at least 10 times every 1-2 hours when you are awake. Take your time and take a few normal breaths between deep breaths. The spirometer may include an indicator to show your best effort. Use the indicator as a goal to work toward during each repetition. After each set of 10 deep breaths, practice coughing to be sure your lungs are clear. If you have an incision (the cut made at the time of surgery), support your incision when coughing by placing a pillow or rolled up towels firmly against it. Once you are able to get out of bed, walk around indoors and cough well. You may stop using the incentive spirometer when instructed by your caregiver.  RISKS AND COMPLICATIONS Take your time so you do not get dizzy or light-headed. If you are in pain, you may need to take or ask for pain medication before doing incentive spirometry. It is harder to take a deep breath if you are having pain. AFTER USE Rest and breathe slowly and easily. It can be helpful to keep track of a log of your progress. Your caregiver can provide you with a simple table to help with this. If you are using the spirometer at home, follow these instructions: SEEK MEDICAL CARE IF:  You are having difficultly using the spirometer. You have trouble using the spirometer as often as instructed. Your pain medication is not giving enough relief while using the spirometer. You develop fever of 100.5 F (38.1 C) or higher. SEEK IMMEDIATE MEDICAL CARE IF:  You cough up bloody sputum that had not been present before. You develop fever of 102 F (38.9 C) or greater. You develop worsening pain at or near the incision site. MAKE  SURE YOU:  Understand these instructions. Will watch your condition. Will get help right away if you are not doing well or get worse. Document Released: 01/12/2007 Document Revised: 11/24/2011 Document Reviewed: 03/15/2007 [employer] Patient Information 2014 [employer], [place].   ________________________________________________________________________  WHAT IS A BLOOD TRANSFUSION? Blood Transfusion Information  A transfusion is the replacement of blood or some of its parts. Blood is made up of multiple cells which provide different functions. Red blood cells carry oxygen and are used for blood loss replacement. White blood cells fight against infection. Platelets control bleeding. Plasma helps clot blood. Other blood products are available for specialized needs, such as hemophilia or other clotting disorders. BEFORE THE TRANSFUSION  Who gives blood for transfusions?  Healthy volunteers who are fully evaluated to make sure their blood is safe. This is blood bank blood. Transfusion therapy is the safest it has ever been in the practice of medicine. Before blood is taken from a donor, a complete history is taken to make sure that person has no history of diseases nor engages in risky social behavior (examples are intravenous drug use or sexual activity with multiple partners). The donor's travel history is screened to minimize risk of transmitting infections, such as malaria. The donated blood is tested for signs of infectious diseases, such as HIV and hepatitis. The blood is then tested to be sure it is compatible with you in order to minimize the chance of a transfusion reaction. If you or a relative donates blood, this is often done in anticipation of surgery and is not appropriate for emergency situations. It takes many days to process the donated blood. RISKS AND COMPLICATIONS Although transfusion therapy is very  safe and saves many lives, the main dangers of transfusion include:  Getting an  infectious disease. Developing a transfusion reaction. This is an allergic reaction to something in the blood you were given. Every precaution is taken to prevent this. The decision to have a blood transfusion has been considered carefully by your caregiver before blood is given. Blood is not given unless the benefits outweigh the risks. AFTER THE TRANSFUSION Right after receiving a blood transfusion, you will usually feel much better and more energetic. This is especially true if your red blood cells have gotten low (anemic). The transfusion raises the level of the red blood cells which carry oxygen, and this usually causes an energy increase. The nurse administering the transfusion will monitor you carefully for complications. HOME CARE INSTRUCTIONS  No special instructions are needed after a transfusion. You may find your energy is better. Speak with your caregiver about any limitations on activity for underlying diseases you may have. SEEK MEDICAL CARE IF:  Your condition is not improving after your transfusion. You develop redness or irritation at the intravenous (IV) site. SEEK IMMEDIATE MEDICAL CARE IF:  Any of the following symptoms occur over the next 12 hours: Shaking chills. You have a temperature by mouth above 102 F (38.9 C), not controlled by medicine. Chest, back, or muscle pain. People around you feel you are not acting correctly or are confused. Shortness of breath or difficulty breathing. Dizziness and fainting. You get a rash or develop hives. You have a decrease in urine output. Your urine turns a dark color or changes to pink, red, or brown. Any of the following symptoms occur over the next 10 days: You have a temperature by mouth above 102 F (38.9 C), not controlled by medicine. Shortness of breath. Weakness after normal activity. The white part of the eye turns yellow (jaundice). You have a decrease in the amount of urine or are urinating less often. Your urine  turns a dark color or changes to pink, red, or brown. Document Released: 08/29/2000 Document Revised: 11/24/2011 Document Reviewed: 04/17/2008 [facility] Patient Information 2014 [place], [place].  _______________________________________________________________________

## 2021-09-12 NOTE — Progress Notes (Addendum)
COVID swab appointment: 09-26-21 @ 9:30 AM  COVID Vaccine Completed:  Yes x2 Date COVID Vaccine completed: Has received booster:  Yes x2 COVID vaccine manufacturer: Pfizer      Date of COVID positive in last 90 days:  End of November by home test,  did not consult MD  PCP - Mila Palmer, MD (office note on chart) Cardiologist - Weston Brass, MD  Cardiac clearance in Epic dated 06-25-21 by Azalee Course, PA  Chest x-ray - N/A EKG - 05-07-21 Epic Stress Test - N/A ECHO - 10-08-20 Epic Cardiac Cath - N/A Pacemaker/ICD device last checked: Spinal Cord Stimulator:  Sleep Study - N/A CPAP -   Fasting Blood Sugar - N/A Checks Blood Sugar _____ times a day  Blood Thinner Instructions: Aspirin Instructions:  ASA 81 mg.  Per patient to stop 5 days prior Last Dose:  Activity level:   Can go up a flight of stairs and perform activities of daily living without stopping and without symptoms of chest pain or shortness of breath.      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  Patient denied headache, chest pain or SOB.  Started on BP meds a months ago.  Patient has a BP cuff and will monitor at home and notify PCP if it remains elevated.  Patient also walked from [facility] to get to apointment and was in pain.  Patient denies shortness of breath, fever, cough and chest pain at PAT appointment  Patient verbalized understanding of instructions that were given to them at the PAT appointment. Patient was also instructed that they will need to review over the PAT instructions again at home before surgery.

## 2021-09-18 ENCOUNTER — Encounter (HOSPITAL_COMMUNITY): Payer: Self-pay

## 2021-09-18 ENCOUNTER — Encounter (HOSPITAL_COMMUNITY)
Admission: RE | Admit: 2021-09-18 | Discharge: 2021-09-18 | Disposition: A | Discharge status: Home / Self Care | Payer: Medicare PPO | Source: Ambulatory Visit | Attending: Orthopedic Surgery | Admitting: Orthopedic Surgery

## 2021-09-18 ENCOUNTER — Other Ambulatory Visit: Payer: Self-pay

## 2021-09-18 VITALS — BP 162/98 | HR 72 | Temp 98.3°F | Resp 16 | Ht 63.0 in | Wt 130.8 lb

## 2021-09-18 DIAGNOSIS — Z8673 Personal history of transient ischemic attack (TIA), and cerebral infarction without residual deficits: Secondary | ICD-10-CM | POA: Insufficient documentation

## 2021-09-18 DIAGNOSIS — N189 Chronic kidney disease, unspecified: Secondary | ICD-10-CM | POA: Insufficient documentation

## 2021-09-18 DIAGNOSIS — M1712 Unilateral primary osteoarthritis, left knee: Secondary | ICD-10-CM | POA: Diagnosis not present

## 2021-09-18 DIAGNOSIS — Z01812 Encounter for preprocedural laboratory examination: Secondary | ICD-10-CM | POA: Insufficient documentation

## 2021-09-18 DIAGNOSIS — I129 Hypertensive chronic kidney disease with stage 1 through stage 4 chronic kidney disease, or unspecified chronic kidney disease: Secondary | ICD-10-CM | POA: Diagnosis not present

## 2021-09-18 DIAGNOSIS — Z01818 Encounter for other preprocedural examination: Secondary | ICD-10-CM

## 2021-09-18 DIAGNOSIS — J449 Chronic obstructive pulmonary disease, unspecified: Secondary | ICD-10-CM | POA: Insufficient documentation

## 2021-09-18 DIAGNOSIS — Z87891 Personal history of nicotine dependence: Secondary | ICD-10-CM | POA: Insufficient documentation

## 2021-09-18 HISTORY — DX: Occlusion and stenosis of unspecified carotid artery: I65.29

## 2021-09-18 HISTORY — DX: Cerebral infarction, unspecified: I63.9

## 2021-09-18 HISTORY — DX: Chronic obstructive pulmonary disease, unspecified: J44.9

## 2021-09-18 HISTORY — DX: Thyrotoxicosis, unspecified without thyrotoxic crisis or storm: E05.90

## 2021-09-18 LAB — COMPREHENSIVE METABOLIC PANEL
ALT: 18 U/L (ref 0–44)
AST: 14 U/L — ABNORMAL LOW (ref 15–41)
Albumin: 3.7 g/dL (ref 3.5–5.0)
Alkaline Phosphatase: 76 U/L (ref 38–126)
Anion gap: 9 (ref 5–15)
BUN: 19 mg/dL (ref 8–23)
CO2: 28 mmol/L (ref 22–32)
Calcium: 9 mg/dL (ref 8.9–10.3)
Chloride: 100 mmol/L (ref 98–111)
Creatinine, Ser: 0.62 mg/dL (ref 0.44–1.00)
GFR, Estimated: >60 mL/min (ref >60)
Glucose, Bld: 90 mg/dL (ref 70–99)
Potassium: 3.6 mmol/L (ref 3.5–5.1)
Sodium: 137 mmol/L (ref 135–145)
Total Bilirubin: 1.2 mg/dL (ref 0.3–1.2)
Total Protein: 7.2 g/dL (ref 6.5–8.1)

## 2021-09-18 LAB — SURGICAL PCR SCREEN
MRSA, PCR: NEGATIVE
Staphylococcus aureus: NEGATIVE

## 2021-09-18 LAB — CBC
HCT: 42.1 % (ref 36.0–46.0)
Hemoglobin: 13.7 g/dL (ref 12.0–15.0)
MCH: 32.9 pg (ref 26.0–34.0)
MCHC: 32.5 g/dL (ref 30.0–36.0)
MCV: 101 fL — ABNORMAL HIGH (ref 80.0–100.0)
Platelets: 228 10*3/uL (ref 150–400)
RBC: 4.17 MIL/uL (ref 3.87–5.11)
RDW: 13.8 % (ref 11.5–15.5)
WBC: 8.4 10*3/uL (ref 4.0–10.5)
nRBC: 0 % (ref 0.0–0.2)

## 2021-09-18 LAB — TYPE AND SCREEN
ABO/RH(D): O POS
Antibody Screen: NEGATIVE

## 2021-09-18 LAB — PROTIME-INR
INR: 1 (ref 0.8–1.2)
Prothrombin Time: 13.6 seconds (ref 11.4–15.2)

## 2021-09-20 NOTE — Anesthesia Preprocedure Evaluation (Addendum)
Anesthesia Evaluation  Patient identified by MRN, date of birth, ID band Patient awake    Reviewed: Allergy & Precautions, H&P , NPO status , Patient's Chart, lab work & pertinent test results  Airway Mallampati: II  TM Distance: >3 FB Neck ROM: Full    Dental no notable dental hx. (+) Teeth Intact, Dental Advisory Given   Pulmonary asthma , COPD,  COPD inhaler, former smoker,    Pulmonary exam normal breath sounds clear to auscultation       Cardiovascular hypertension, Pt. on medications  Rhythm:Regular Rate:Normal     Neuro/Psych  Headaches, Depression CVA    GI/Hepatic negative GI ROS, Neg liver ROS,   Endo/Other  Hyperthyroidism   Renal/GU negative Renal ROS  negative genitourinary   Musculoskeletal  (+) Arthritis , Osteoarthritis,    Abdominal   Peds  Hematology  (+) Blood dyscrasia, anemia ,   Anesthesia Other Findings   Reproductive/Obstetrics negative OB ROS                           Anesthesia Physical Anesthesia Plan  ASA: 3  Anesthesia Plan: Spinal   Post-op Pain Management: Regional block and Ofirmev IV (intra-op)   Induction: Intravenous  PONV Risk Score and Plan: 3 and Propofol infusion, Ondansetron and Treatment may vary due to age or medical condition  Airway Management Planned: Natural Airway and Simple Face Mask  Additional Equipment:   Intra-op Plan:   Post-operative Plan:   Informed Consent: I have reviewed the patients History and Physical, chart, labs and discussed the procedure including the risks, benefits and alternatives for the proposed anesthesia with the patient or authorized representative who has indicated his/her understanding and acceptance.     Dental advisory given  Plan Discussed with: CRNA  Anesthesia Plan Comments: (See PAT note 09/18/2021, Jodell Cipro Ward, PA-C)       Anesthesia Quick Evaluation

## 2021-09-20 NOTE — Progress Notes (Signed)
Anesthesia Chart Review   Case: [id number] Date/Time: 09/30/21 1330   Procedure: TOTAL KNEE ARTHROPLASTY (Left: Knee)   Anesthesia type: Choice   Pre-op diagnosis: left knee osteoarthritis   Location: [facility] ROOM 09 / [facility]   Surgeons: Gaynelle Arabian, MD       DISCUSSION:85 y.o. former smoker with h/o HTN, CVA, carotid artery disease, CKD, COPD, left knee OA scheduled for above procedure 09/30/2021 with Dr. Gaynelle Arabian.   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  Per cardiology preoperative evaluation 06/25/21, "Chart reviewed as part of pre-operative protocol coverage. Patient was contacted 06/25/2021 in reference to pre-operative risk assessment for pending surgery as outlined below.  Thearsa Granger Forsman was last seen on 05/07/2021 by Dr. Margaretann Loveless.  Since that day, NANNETTE HUN has done well without chest pain or worsening dyspnea.  Patient was previously cleared by Dr. Margaretann Loveless during previous office visit on 05/07/2021 for the same procedure.   Therefore, based on ACC/AHA guidelines, the patient would be at acceptable risk for the planned procedure without further cardiovascular testing. "  Anticipate pt can proceed with planned procedure barring acute status change.   VS: BP (!) 162/98    Pulse 72    Temp 36.8 C (Oral)    Resp 16    Ht 5\' 3"  (1.6 m)    Wt 59.3 kg    SpO2 95%    BMI 23.17 kg/m   PROVIDERS: Jonathon Jordan, MD is PCP   Primary Cardiologist: Elouise Munroe, MD   LABS: Labs reviewed: Acceptable for surgery. (all labs ordered are listed, but only abnormal results are displayed)  Labs Reviewed  CBC - Abnormal; Notable for the following components:      Result Value   MCV 101.0 (*)    All other components within normal limits  COMPREHENSIVE METABOLIC PANEL - Abnormal; Notable for the following components:   AST 14 (*)    All other components within normal limits   SURGICAL PCR SCREEN  PROTIME-INR  TYPE AND SCREEN     IMAGES:   EKG: 05/07/2021 Rate 67 bpm  NSR LAD Incomplete RBBB Left ventricular hypertrophy with repolarization abnormality Cannot rule out septal infarct, age undetermined  CV: Echo 10/08/2020 1. Left ventricular ejection fraction, by estimation, is 60 to 65%. The  left ventricle has normal function. The left ventricle has no regional  wall motion abnormalities. There is mild concentric left ventricular  hypertrophy. Left ventricular diastolic  parameters are consistent with Grade I diastolic dysfunction (impaired  relaxation). Elevated left atrial pressure.   2. Right ventricular systolic function is normal. The right ventricular  size is normal. There is normal pulmonary artery systolic pressure. The  estimated right ventricular systolic pressure is 0000000 mmHg.   3. Left atrial size was moderately dilated.   4. The mitral valve is normal in structure. Mild mitral valve  regurgitation. No evidence of mitral stenosis. Moderate mitral annular  calcification.   5. The aortic valve is normal in structure. There is moderate  calcification of the aortic valve. There is moderate thickening of the  aortic valve. Aortic valve regurgitation is trivial. Mild to moderate  aortic valve sclerosis/calcification is present,  without any evidence of aortic stenosis.   6. Aortic dilatation noted. There is borderline dilatation of the  ascending aorta, measuring 40 mm.   7. The inferior vena cava is normal in size with greater than 50%  respiratory variability,  suggesting right atrial pressure of 3 mmHg.  Past Medical History:  Diagnosis Date   Allergy    Arthritis    ASTHMA 05/03/2009   Asthma    Carotid stenosis    Chronic kidney disease    " in remission"   COPD (chronic obstructive pulmonary disease) (HCC)    CVA (cerebral infarction)    Dyslipidemia    Headache    Hearing aid worn    B/L   HYPERTENSION 04/30/2007    HYPERTHYROIDISM 06/17/2007   Hyperthyroidism    Menopause    Multinodular goiter    OSTEOPOROSIS 04/30/2007   Stroke ([place])    Wears glasses     Past Surgical History:  Procedure Laterality Date   BACK SURGERY     BREAST EXCISIONAL BIOPSY Left    carotid artery surgery     CATARACT EXTRACTION W/ INTRAOCULAR LENS  IMPLANT, BILATERAL     ENDARTERECTOMY Right 09/29/2019   Procedure: REDO OF RIGHT ENDARTERECTOMY CAROTID;  Surgeon: Waynetta Sandy, MD;  Location: [place];  Service: Vascular;  Laterality: Right;   NECK SURGERY     OTHER SURGICAL HISTORY     discectomy   WISDOM TOOTH EXTRACTION      MEDICATIONS:  albuterol (PROVENTIL HFA;VENTOLIN HFA) 108 (90 BASE) MCG/ACT inhaler   alendronate (FOSAMAX) 70 MG tablet   aspirin EC 81 MG EC tablet   azelastine (ASTELIN) 0.1 % nasal spray   b complex vitamins tablet   Biotin 5000 MCG CAPS   Calcium Carbonate 500 (200 Ca) MG WAFR   desloratadine (CLARINEX) 5 MG tablet   diphenhydrAMINE (BENADRYL) 25 MG tablet   diphenhydrAMINE (SOMINEX) 25 MG tablet   ezetimibe-simvastatin (VYTORIN) 10-20 MG per tablet   fexofenadine (ALLEGRA) 180 MG tablet   ipratropium (ATROVENT) 0.03 % nasal spray   levalbuterol (XOPENEX) 0.63 MG/3ML nebulizer solution   magnesium gluconate (MAGONATE) 500 MG tablet   methimazole (TAPAZOLE) 10 MG tablet   montelukast (SINGULAIR) 10 MG tablet   Multiple Vitamin (MULTIVITAMIN WITH MINERALS) TABS tablet   SYMBICORT 160-4.5 MCG/ACT inhaler   valsartan-hydrochlorothiazide (DIOVAN-HCT) 320-12.5 MG tablet   Vitamin D3 (VITAMIN D) 25 MCG tablet   No current facility-administered medications for this encounter.     Konrad Felix Ward, PA-C [facility] Pre-Surgical Testing [phone]

## 2021-09-26 ENCOUNTER — Encounter (HOSPITAL_COMMUNITY)
Admission: RE | Admit: 2021-09-26 | Discharge: 2021-09-26 | Disposition: A | Discharge status: Home / Self Care | Payer: Medicare PPO | Source: Ambulatory Visit | Attending: Orthopedic Surgery | Admitting: Orthopedic Surgery

## 2021-09-26 ENCOUNTER — Other Ambulatory Visit: Payer: Self-pay

## 2021-09-26 DIAGNOSIS — Z20822 Contact with and (suspected) exposure to covid-19: Secondary | ICD-10-CM | POA: Insufficient documentation

## 2021-09-26 DIAGNOSIS — Z01818 Encounter for other preprocedural examination: Secondary | ICD-10-CM

## 2021-09-26 DIAGNOSIS — Z01812 Encounter for preprocedural laboratory examination: Secondary | ICD-10-CM | POA: Insufficient documentation

## 2021-09-26 LAB — SARS CORONAVIRUS 2 (TAT 6-24 HRS): SARS Coronavirus 2: NEGATIVE

## 2021-09-29 NOTE — H&P (Signed)
TOTAL KNEE ADMISSION H&P  Patient is being admitted for left total knee arthroplasty.  Subjective:  Chief Complaint: Left knee pain.  HPI: Ariel Lambert, 86 y.o. female has a history of pain and functional disability in the left knee due to arthritis and has failed non-surgical conservative treatments for greater than 12 weeks to include NSAID's and/or analgesics, flexibility and strengthening excercises, and activity modification. Onset of symptoms was gradual, starting  several  years ago with gradually worsening course since that time. The patient noted no past surgery on the left knee.  Patient currently rates pain in the left knee at 6 out of 10 with activity. Patient has worsening of pain with activity and weight bearing, pain that interferes with activities of daily living, pain with passive range of motion, and crepitus. Patient has evidence of periarticular osteophytes and joint space narrowing by imaging studies. There is no active infection.  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 ([place]) 10/11/2020   Chronic nephritic syndrome, focal and segmental glomerular lesions 10/11/2020   Non-toxic goiter 10/11/2020   Occlusion and stenosis of bilateral carotid arteries 10/11/2020   Overactive bladder 10/11/2020   Protein calorie malnutrition ([place]) 10/11/2020   Psoriasis 10/11/2020   Vitamin D deficiency 10/11/2020   Right internal  carotid artery aneurysm 09/29/2019   Bilateral knee pain 07/01/2019   Neck nodule 06/22/2019   COPD exacerbation ([place]) 09/02/2015   Asthma exacerbation 09/02/2015   Allergic rhinitis 03/16/2013   Nontoxic multinodular goiter 12/30/2010   ASTHMA 05/03/2009   HYPOKALEMIA 06/13/2008   Thyrotoxicosis 06/17/2007   Essential hypertension 04/30/2007   OSTEOPOROSIS 04/30/2007    Past Medical History:  Diagnosis Date   Allergy    Arthritis    ASTHMA 05/03/2009   Asthma    Carotid stenosis    Chronic kidney disease    " in remission"   COPD (chronic obstructive pulmonary disease) ([place])    CVA (cerebral infarction)    Dyslipidemia    Headache    Hearing aid worn    B/L   HYPERTENSION 04/30/2007   HYPERTHYROIDISM 06/17/2007   Hyperthyroidism    Menopause    Multinodular goiter    OSTEOPOROSIS 04/30/2007   Stroke ([place])    Wears glasses     Past Surgical History:  Procedure Laterality Date   BACK SURGERY     BREAST EXCISIONAL BIOPSY Left    carotid artery surgery     CATARACT EXTRACTION W/ INTRAOCULAR LENS  IMPLANT, BILATERAL     ENDARTERECTOMY Right 09/29/2019   Procedure: REDO OF RIGHT ENDARTERECTOMY CAROTID;  Surgeon: Waynetta Sandy, MD;  Location: [facility];  Service: Vascular;  Laterality: Right;   NECK SURGERY     OTHER SURGICAL HISTORY     discectomy   WISDOM TOOTH EXTRACTION      Prior to Admission medications   Medication Sig Start Date End Date Taking? Authorizing Provider  albuterol (PROVENTIL HFA;VENTOLIN HFA) 108 (90 BASE) MCG/ACT inhaler Inhale 2  puffs into the lungs every 6 (six) hours as needed. Patient taking differently: Inhale 2 puffs into the lungs every 6 (six) hours as needed for wheezing or shortness of breath (Asthma). 04/20/14  Yes Collene Gobble, MD  alendronate (FOSAMAX) 70 MG tablet Take 70 mg by mouth every Friday. Take with a full glass of water on an empty stomach.   Yes [provider]  aspirin EC 81 MG EC tablet Take 1 tablet  (81 mg total) by mouth daily at 6 (six) AM. 10/01/19  Yes Dagoberto Ligas, PA-C  azelastine (ASTELIN) 0.1 % nasal spray Place 1 spray into both nostrils at bedtime. Use in each nostril as directed   Yes [provider]  b complex vitamins tablet Take 1 tablet by mouth daily.   Yes [provider]  Biotin 5000 MCG CAPS Take 5,000 mcg by mouth daily.   Yes [provider]  Calcium Carbonate 500 (200 Ca) MG WAFR 500 mg 2 (two) times daily with a meal.   Yes [provider]  desloratadine (CLARINEX) 5 MG tablet Take 5 mg by mouth at bedtime. 08/21/21  Yes [provider]  diphenhydrAMINE (BENADRYL) 25 MG tablet 25 mg daily as needed for allergies.   Yes [provider]  diphenhydrAMINE (SOMINEX) 25 MG tablet Take 25 mg by mouth at bedtime as needed for sleep.   Yes [provider]  ezetimibe-simvastatin (VYTORIN) 10-20 MG per tablet Take 1 tablet by mouth daily after breakfast.   Yes [provider]  fexofenadine (ALLEGRA) 180 MG tablet Take 180 mg by mouth daily.   Yes [provider]  ipratropium (ATROVENT) 0.03 % nasal spray Place 2 sprays into both nostrils daily.   Yes [provider]  levalbuterol (XOPENEX) 0.63 MG/3ML nebulizer solution Take 3 mLs (0.63 mg total) by nebulization every 4 (four) hours. Patient taking differently: Take 0.63 mg by nebulization every 6 (six) hours as needed for wheezing or shortness of breath. 09/07/15  Yes Nita Sells, MD  magnesium gluconate (MAGONATE) 500 MG tablet Take 500 mg by mouth daily.   Yes [provider]  methimazole (TAPAZOLE) 10 MG tablet TAKE 1 TABLET BY MOUTH EVERY DAY Patient taking differently: Take 5-10 mg by mouth See admin instructions. Take 10 mg one day 5 mg the next day alternate every other day 07/25/21  Yes Renato Shin, MD  montelukast (SINGULAIR) 10 MG tablet Take 10 mg by mouth at bedtime.   Yes [provider]  Multiple  Vitamin (MULTIVITAMIN WITH MINERALS) TABS tablet Take 1 tablet by mouth daily.   Yes [provider]  SYMBICORT 160-4.5 MCG/ACT inhaler Inhale 2 puffs into the lungs 2 (two) times daily. 07/09/15  Yes [provider]  valsartan-hydrochlorothiazide (DIOVAN-HCT) 320-12.5 MG tablet Take 1 tablet by mouth daily. 07/20/21  Yes [provider]  Vitamin D3 (VITAMIN D) 25 MCG tablet Take 1,000 Units by mouth daily.   Yes [provider]    Allergies  Allergen Reactions   Cetirizine Other (See Comments)    Stomach pain   Cetirizine & Related Other (See Comments)    Stomach pains   Codeine Itching    Severe itching   Meperidine Diarrhea and Nausea And Vomiting   Meperidine Hcl Diarrhea and Nausea And Vomiting    (Demerol)   Nabumetone Rash    (Relafen)   Naproxen Itching and Rash   Sulfa Antibiotics Rash   Sulfonamide Derivatives Rash    Social History   Socioeconomic  History   Marital status: Widowed    Spouse name: Not on file   Number of children: Not on file   Years of education: Not on file   Highest education level: Not on file  Occupational History   Occupation: Teacher    Employer: RETIRED  Tobacco Use   Smoking status: Former    Types: Cigarettes   Smokeless tobacco: Never   Tobacco comments:    "Quit smoking cigarettes in my 40's "  Vaping Use   Vaping Use: Never used  Substance and Sexual Activity   Alcohol use: Yes    Alcohol/week: 0.0 standard drinks    Comment: Socially   Drug use: No   Sexual activity: Not on file  Other Topics Concern   Not on file  Social History Narrative   Lives alone.   Social Determinants of Health   Financial Resource Strain: Not on file  Food Insecurity: Not on file  Transportation Needs: Not on file  Physical Activity: Not on file  Stress: Not on file  Social Connections: Not on file  Intimate Partner Violence: Not on file    Tobacco Use: Medium Risk   Smoking Tobacco Use: Former    Smokeless Tobacco Use: Never   Passive Exposure: Not on file   Social History   Substance and Sexual Activity  Alcohol Use Yes   Alcohol/week: 0.0 standard drinks   Comment: Socially    Family History  Problem Relation Age of Onset   Heart disease Other    Breast cancer Mother 68    ROS: Constitutional: no fever, no chills, no night sweats, no significant weight loss Cardiovascular: no chest pain, no palpitations Respiratory: no cough, no shortness of breath, No COPD Gastrointestinal: no vomiting, no nausea Musculoskeletal: no swelling in Joints, Joint Pain Neurologic: no numbness, no tingling, no difficulty with balance   Objective:  Physical Exam: Well nourished and well developed.  General: Alert and oriented x3, cooperative and pleasant, no acute distress.  Head: normocephalic, atraumatic, neck supple.  Eyes: EOMI.  Respiratory: breath sounds clear in all fields, no wheezing, rales, or rhonchi. Cardiovascular: Regular rate and rhythm, no murmurs, gallops or rubs.  Abdomen: non-tender to palpation and soft, normoactive bowel sounds. Musculoskeletal: Right Hip Exam:   Left Knee Exam: Trace swelling. Range of motion is 5-135 degrees. Slight crepitus on range of motion of the knee. No medial joint line tenderness Significant lateral joint line tenderness. Stable knee.  Sensation and motor function intact in LE. Distal pulses 2+. Calves soft and nontender.  Calves soft and nontender. Motor function intact in LE. Strength 5/5 LE bilaterally. Neuro: Distal pulses 2+. Sensation to light touch intact in LE.   Vital signs in last 24 hours:    Imaging Review AP and lateral of the bilateral knees dated demonstrate lateral compartment arthritis bilaterally. She is bone-on-bone in the lateral compartment on the left. On the right, she is near bone-on-bone in the lateral compartment.  Assessment/Plan:  End stage arthritis, left knee   The patient history, physical  examination, clinical judgment of the provider and imaging studies are consistent with end stage degenerative joint disease of the left knee and total knee arthroplasty is deemed medically necessary. The treatment options including medical management, injection therapy arthroscopy and arthroplasty were discussed at length. The risks and benefits of total knee arthroplasty were presented and reviewed. The risks due to aseptic loosening, infection, stiffness, patella tracking problems, thromboembolic complications and other imponderables were discussed. The patient acknowledged  the explanation, agreed to proceed with the plan and consent was signed. Patient is being admitted for inpatient treatment for surgery, pain control, PT, OT, prophylactic antibiotics, VTE prophylaxis, progressive ambulation and ADLs and discharge planning. The patient is planning to be discharged  home .   Patient's anticipated LOS is less than 2 midnights, meeting these requirements: - Younger than 69 - Lives within 1 hour of care - Has a competent adult at home to recover with post-op recover - NO history of  - Chronic pain requiring opiods  - Diabetes  - Coronary Artery Disease  - Heart failure  - Heart attack  - Stroke  - DVT/VTE  - Cardiac arrhythmia  - Respiratory Failure/COPD  - Renal failure  - Anemia  - Advanced Liver disease    Therapy Plans: EO Disposition: Home with Grandson Planned DVT Prophylaxis: Xarelto 10mg  (Hx of Stroke) DME Needed: Gilford Rile PCP: Jonathon Jordan, MD (clearance received - stop 81mg  Aspirin 5 days prior to sx) TXA: IV Allergies: Cetirizine, Codeine, Meperidine, Nabumetone, Naproxen, Sulfa drugs Anesthesia Concerns: None BMI: 23.6 Last HgbA1c: n/a  Pharmacy: [facility] [address]  Other: - Hx of Stroke in 2001  - Patient was instructed on what medications to stop prior to surgery. - Follow-up visit in 2 weeks with Dr. Wynelle Link - Begin physical therapy following surgery -  Pre-operative lab work as pre-surgical testing - Prescriptions will be provided in hospital at time of discharge  Fenton Foy, [facility], PA-C Orthopedic Surgery [facility] [place]

## 2021-09-30 ENCOUNTER — Encounter (HOSPITAL_COMMUNITY): Payer: Self-pay | Admitting: Orthopedic Surgery

## 2021-09-30 ENCOUNTER — Ambulatory Visit (HOSPITAL_COMMUNITY): Payer: Medicare PPO | Admitting: Physician Assistant

## 2021-09-30 ENCOUNTER — Other Ambulatory Visit: Payer: Self-pay

## 2021-09-30 ENCOUNTER — Ambulatory Visit (HOSPITAL_COMMUNITY): Payer: Medicare PPO | Admitting: Anesthesiology

## 2021-09-30 ENCOUNTER — Inpatient Hospital Stay (HOSPITAL_COMMUNITY)
Admission: RE | Admit: 2021-09-30 | Discharge: 2021-10-09 | DRG: 469 | Disposition: A | Discharge status: Home Health | Payer: Medicare PPO | Attending: Orthopedic Surgery | Admitting: Orthopedic Surgery

## 2021-09-30 ENCOUNTER — Encounter (HOSPITAL_COMMUNITY)
Admission: RE | Disposition: A | Discharge status: Home Health | Payer: Self-pay | Source: Home / Self Care | Attending: Orthopedic Surgery

## 2021-09-30 DIAGNOSIS — Z882 Allergy status to sulfonamides status: Secondary | ICD-10-CM

## 2021-09-30 DIAGNOSIS — E1122 Type 2 diabetes mellitus with diabetic chronic kidney disease: Secondary | ICD-10-CM | POA: Diagnosis not present

## 2021-09-30 DIAGNOSIS — F325 Major depressive disorder, single episode, in full remission: Secondary | ICD-10-CM | POA: Diagnosis present

## 2021-09-30 DIAGNOSIS — I130 Hypertensive heart and chronic kidney disease with heart failure and stage 1 through stage 4 chronic kidney disease, or unspecified chronic kidney disease: Secondary | ICD-10-CM | POA: Diagnosis not present

## 2021-09-30 DIAGNOSIS — M810 Age-related osteoporosis without current pathological fracture: Secondary | ICD-10-CM | POA: Diagnosis present

## 2021-09-30 DIAGNOSIS — E669 Obesity, unspecified: Secondary | ICD-10-CM | POA: Diagnosis present

## 2021-09-30 DIAGNOSIS — Z20822 Contact with and (suspected) exposure to covid-19: Secondary | ICD-10-CM | POA: Diagnosis not present

## 2021-09-30 DIAGNOSIS — J9690 Respiratory failure, unspecified, unspecified whether with hypoxia or hypercapnia: Secondary | ICD-10-CM | POA: Diagnosis present

## 2021-09-30 DIAGNOSIS — Z885 Allergy status to narcotic agent status: Secondary | ICD-10-CM

## 2021-09-30 DIAGNOSIS — Z886 Allergy status to analgesic agent status: Secondary | ICD-10-CM

## 2021-09-30 DIAGNOSIS — E785 Hyperlipidemia, unspecified: Secondary | ICD-10-CM | POA: Diagnosis present

## 2021-09-30 DIAGNOSIS — E559 Vitamin D deficiency, unspecified: Secondary | ICD-10-CM | POA: Diagnosis present

## 2021-09-30 DIAGNOSIS — N181 Chronic kidney disease, stage 1: Secondary | ICD-10-CM | POA: Diagnosis present

## 2021-09-30 DIAGNOSIS — Z87891 Personal history of nicotine dependence: Secondary | ICD-10-CM

## 2021-09-30 DIAGNOSIS — F05 Delirium due to known physiological condition: Secondary | ICD-10-CM | POA: Diagnosis not present

## 2021-09-30 DIAGNOSIS — Z7982 Long term (current) use of aspirin: Secondary | ICD-10-CM

## 2021-09-30 DIAGNOSIS — E0590 Thyrotoxicosis, unspecified without thyrotoxic crisis or storm: Secondary | ICD-10-CM | POA: Diagnosis not present

## 2021-09-30 DIAGNOSIS — E0520 Thyrotoxicosis with toxic multinodular goiter without thyrotoxic crisis or storm: Secondary | ICD-10-CM | POA: Diagnosis present

## 2021-09-30 DIAGNOSIS — Z6824 Body mass index (BMI) 24.0-24.9, adult: Secondary | ICD-10-CM

## 2021-09-30 DIAGNOSIS — Z79899 Other long term (current) drug therapy: Secondary | ICD-10-CM

## 2021-09-30 DIAGNOSIS — J449 Chronic obstructive pulmonary disease, unspecified: Secondary | ICD-10-CM | POA: Diagnosis present

## 2021-09-30 DIAGNOSIS — N390 Urinary tract infection, site not specified: Secondary | ICD-10-CM | POA: Diagnosis not present

## 2021-09-30 DIAGNOSIS — Z961 Presence of intraocular lens: Secondary | ICD-10-CM | POA: Diagnosis present

## 2021-09-30 DIAGNOSIS — I10 Essential (primary) hypertension: Secondary | ICD-10-CM | POA: Diagnosis not present

## 2021-09-30 DIAGNOSIS — Z9841 Cataract extraction status, right eye: Secondary | ICD-10-CM

## 2021-09-30 DIAGNOSIS — D649 Anemia, unspecified: Secondary | ICD-10-CM | POA: Diagnosis present

## 2021-09-30 DIAGNOSIS — E871 Hypo-osmolality and hyponatremia: Secondary | ICD-10-CM | POA: Diagnosis not present

## 2021-09-30 DIAGNOSIS — Z888 Allergy status to other drugs, medicaments and biological substances status: Secondary | ICD-10-CM

## 2021-09-30 DIAGNOSIS — G8929 Other chronic pain: Secondary | ICD-10-CM | POA: Diagnosis present

## 2021-09-30 DIAGNOSIS — M1712 Unilateral primary osteoarthritis, left knee: Principal | ICD-10-CM | POA: Diagnosis present

## 2021-09-30 DIAGNOSIS — Z7951 Long term (current) use of inhaled steroids: Secondary | ICD-10-CM

## 2021-09-30 DIAGNOSIS — Z803 Family history of malignant neoplasm of breast: Secondary | ICD-10-CM

## 2021-09-30 DIAGNOSIS — G8918 Other acute postprocedural pain: Secondary | ICD-10-CM | POA: Diagnosis not present

## 2021-09-30 DIAGNOSIS — Z9842 Cataract extraction status, left eye: Secondary | ICD-10-CM

## 2021-09-30 DIAGNOSIS — K769 Liver disease, unspecified: Secondary | ICD-10-CM | POA: Diagnosis present

## 2021-09-30 DIAGNOSIS — R0902 Hypoxemia: Secondary | ICD-10-CM

## 2021-09-30 HISTORY — PX: TOTAL KNEE ARTHROPLASTY: SHX125

## 2021-09-30 SURGERY — ARTHROPLASTY, KNEE, TOTAL
Anesthesia: Spinal | Site: Knee | Laterality: Left

## 2021-09-30 MED ORDER — FLEET ENEMA 7-19 GM/118ML RE ENEM: 1.0000 | ENEMA | Freq: Once | RECTAL | Status: DC | PRN

## 2021-09-30 MED ORDER — METHOCARBAMOL 500 MG IVPB - SIMPLE MED
500.0000 mg | Freq: Four times a day (QID) | INTRAVENOUS | Status: DC | PRN
Start: 2021-09-30 — End: 2021-10-09
  Filled 2021-09-30: qty 50

## 2021-09-30 MED ORDER — ORAL CARE MOUTH RINSE: 15.0000 mL | Freq: Once | OROMUCOSAL | Status: AC

## 2021-09-30 MED ORDER — METOCLOPRAMIDE HCL 5 MG/ML IJ SOLN: 5.0000 mg | Freq: Three times a day (TID) | INTRAMUSCULAR | Status: DC | PRN

## 2021-09-30 MED ORDER — PROPOFOL 500 MG/50ML IV EMUL
INTRAVENOUS | Status: DC | PRN
  Administered 2021-09-30: 75 ug/kg/min via INTRAVENOUS

## 2021-09-30 MED ORDER — ACETAMINOPHEN 500 MG PO TABS
1000.0000 mg | ORAL_TABLET | Freq: Four times a day (QID) | ORAL | Status: AC
  Administered 2021-09-30 – 2021-10-01 (×4): 1000 mg via ORAL
  Filled 2021-09-30 (×4): qty 2

## 2021-09-30 MED ORDER — DEXAMETHASONE SODIUM PHOSPHATE 10 MG/ML IJ SOLN
10.0000 mg | Freq: Once | INTRAMUSCULAR | Status: AC
  Administered 2021-10-01: 10 mg via INTRAVENOUS
  Filled 2021-09-30: qty 1

## 2021-09-30 MED ORDER — DIPHENHYDRAMINE HCL 12.5 MG/5ML PO ELIX
12.5000 mg | ORAL_SOLUTION | ORAL | Status: DC | PRN
  Administered 2021-10-01 (×5): 25 mg via ORAL
  Administered 2021-10-02: 12.5 mg via ORAL
  Administered 2021-10-05 – 2021-10-08 (×3): 25 mg via ORAL
  Filled 2021-09-30 (×8): qty 10
  Filled 2021-09-30: qty 5

## 2021-09-30 MED ORDER — LACTATED RINGERS IV SOLN: INTRAVENOUS | Status: DC

## 2021-09-30 MED ORDER — SODIUM CHLORIDE (PF) 0.9 % IJ SOLN
INTRAMUSCULAR | Status: AC
  Filled 2021-09-30: qty 10

## 2021-09-30 MED ORDER — BUPIVACAINE-EPINEPHRINE (PF) 0.5% -1:200000 IJ SOLN
INTRAMUSCULAR | Status: DC | PRN
  Administered 2021-09-30: 20 mL via PERINEURAL

## 2021-09-30 MED ORDER — TRANEXAMIC ACID-NACL 1000-0.7 MG/100ML-% IV SOLN
1000.0000 mg | INTRAVENOUS | Status: AC
  Administered 2021-09-30: 1000 mg via INTRAVENOUS
  Filled 2021-09-30: qty 100

## 2021-09-30 MED ORDER — ALBUTEROL SULFATE (5 MG/ML) 0.5% IN NEBU: 2.5000 mg | INHALATION_SOLUTION | Freq: Four times a day (QID) | RESPIRATORY_TRACT | Status: DC | PRN

## 2021-09-30 MED ORDER — ALBUTEROL SULFATE (2.5 MG/3ML) 0.083% IN NEBU: 2.5000 mg | INHALATION_SOLUTION | Freq: Four times a day (QID) | RESPIRATORY_TRACT | Status: DC | PRN

## 2021-09-30 MED ORDER — ONDANSETRON HCL 4 MG PO TABS: 4.0000 mg | ORAL_TABLET | Freq: Four times a day (QID) | ORAL | Status: DC | PRN

## 2021-09-30 MED ORDER — PHENYLEPHRINE HCL-NACL 20-0.9 MG/250ML-% IV SOLN
INTRAVENOUS | Status: DC | PRN
  Administered 2021-09-30: 25 ug/min via INTRAVENOUS

## 2021-09-30 MED ORDER — MORPHINE SULFATE (PF) 2 MG/ML IV SOLN
0.5000 mg | INTRAVENOUS | Status: DC | PRN
  Administered 2021-10-01 – 2021-10-02 (×3): 1 mg via INTRAVENOUS
  Filled 2021-09-30 (×3): qty 1

## 2021-09-30 MED ORDER — FENTANYL CITRATE PF 50 MCG/ML IJ SOSY: 25.0000 ug | PREFILLED_SYRINGE | INTRAMUSCULAR | Status: DC | PRN

## 2021-09-30 MED ORDER — ALBUTEROL SULFATE HFA 108 (90 BASE) MCG/ACT IN AERS: 2.0000 | INHALATION_SPRAY | Freq: Four times a day (QID) | RESPIRATORY_TRACT | Status: DC | PRN

## 2021-09-30 MED ORDER — HYDROCHLOROTHIAZIDE 12.5 MG PO TABS
12.5000 mg | ORAL_TABLET | Freq: Every day | ORAL | Status: DC
  Administered 2021-10-01 – 2021-10-06 (×6): 12.5 mg via ORAL
  Filled 2021-09-30 (×6): qty 1

## 2021-09-30 MED ORDER — CEFAZOLIN SODIUM-DEXTROSE 2-4 GM/100ML-% IV SOLN
2.0000 g | Freq: Four times a day (QID) | INTRAVENOUS | Status: AC
  Administered 2021-09-30 – 2021-10-01 (×2): 2 g via INTRAVENOUS
  Filled 2021-09-30 (×2): qty 100

## 2021-09-30 MED ORDER — BUPIVACAINE LIPOSOME 1.3 % IJ SUSP
INTRAMUSCULAR | Status: AC
  Filled 2021-09-30: qty 20

## 2021-09-30 MED ORDER — CEFAZOLIN SODIUM-DEXTROSE 2-4 GM/100ML-% IV SOLN
2.0000 g | INTRAVENOUS | Status: AC
  Administered 2021-09-30: 2 g via INTRAVENOUS
  Filled 2021-09-30: qty 100

## 2021-09-30 MED ORDER — SODIUM CHLORIDE (PF) 0.9 % IJ SOLN
INTRAMUSCULAR | Status: DC | PRN
  Administered 2021-09-30: 60 mL

## 2021-09-30 MED ORDER — METHIMAZOLE 5 MG PO TABS
5.0000 mg | ORAL_TABLET | ORAL | Status: DC
  Administered 2021-10-01 – 2021-10-09 (×5): 5 mg via ORAL
  Filled 2021-09-30 (×5): qty 1

## 2021-09-30 MED ORDER — BUPIVACAINE LIPOSOME 1.3 % IJ SUSP: 20.0000 mL | Freq: Once | INTRAMUSCULAR | Status: DC

## 2021-09-30 MED ORDER — METOCLOPRAMIDE HCL 5 MG PO TABS: 5.0000 mg | ORAL_TABLET | Freq: Three times a day (TID) | ORAL | Status: DC | PRN

## 2021-09-30 MED ORDER — DEXAMETHASONE SODIUM PHOSPHATE 10 MG/ML IJ SOLN
8.0000 mg | Freq: Once | INTRAMUSCULAR | Status: DC
Start: 2021-09-30 — End: 2021-09-30

## 2021-09-30 MED ORDER — RIVAROXABAN 10 MG PO TABS
10.0000 mg | ORAL_TABLET | Freq: Every day | ORAL | Status: DC
  Administered 2021-10-01 – 2021-10-09 (×9): 10 mg via ORAL
  Filled 2021-09-30 (×9): qty 1

## 2021-09-30 MED ORDER — TRAMADOL HCL 50 MG PO TABS
50.0000 mg | ORAL_TABLET | Freq: Four times a day (QID) | ORAL | Status: DC | PRN
  Administered 2021-09-30: 50 mg via ORAL
  Administered 2021-10-01 – 2021-10-08 (×8): 100 mg via ORAL
  Administered 2021-10-08: 21:00:00 50 mg via ORAL
  Administered 2021-10-08 – 2021-10-09 (×3): 100 mg via ORAL
  Filled 2021-09-30 (×4): qty 2
  Filled 2021-09-30: qty 1
  Filled 2021-09-30 (×8): qty 2

## 2021-09-30 MED ORDER — METHOCARBAMOL 500 MG PO TABS
500.0000 mg | ORAL_TABLET | Freq: Four times a day (QID) | ORAL | Status: DC | PRN
  Administered 2021-09-30 – 2021-10-09 (×12): 500 mg via ORAL
  Filled 2021-09-30 (×13): qty 1

## 2021-09-30 MED ORDER — SODIUM CHLORIDE 0.9 % IV SOLN: INTRAVENOUS | Status: DC

## 2021-09-30 MED ORDER — EZETIMIBE-SIMVASTATIN 10-20 MG PO TABS
1.0000 | ORAL_TABLET | Freq: Every day | ORAL | Status: DC
  Filled 2021-09-30 (×3): qty 1

## 2021-09-30 MED ORDER — BUPIVACAINE IN DEXTROSE 0.75-8.25 % IT SOLN
INTRATHECAL | Status: DC | PRN
  Administered 2021-09-30: 1.6 mL via INTRATHECAL

## 2021-09-30 MED ORDER — CHLORHEXIDINE GLUCONATE 0.12 % MT SOLN
15.0000 mL | Freq: Once | OROMUCOSAL | Status: AC
  Administered 2021-09-30: 15 mL via OROMUCOSAL

## 2021-09-30 MED ORDER — PHENOL 1.4 % MT LIQD: 1.0000 | OROMUCOSAL | Status: DC | PRN

## 2021-09-30 MED ORDER — DEXAMETHASONE SODIUM PHOSPHATE 10 MG/ML IJ SOLN
INTRAMUSCULAR | Status: DC | PRN
  Administered 2021-09-30: 10 mg via INTRAVENOUS

## 2021-09-30 MED ORDER — 0.9 % SODIUM CHLORIDE (POUR BTL) OPTIME
TOPICAL | Status: DC | PRN
  Administered 2021-09-30: 1000 mL

## 2021-09-30 MED ORDER — HYDROMORPHONE HCL 2 MG PO TABS
2.0000 mg | ORAL_TABLET | Freq: Four times a day (QID) | ORAL | Status: DC | PRN
  Administered 2021-10-01 – 2021-10-03 (×9): 2 mg via ORAL
  Filled 2021-09-30 (×10): qty 1

## 2021-09-30 MED ORDER — SODIUM CHLORIDE 0.9 % IR SOLN
Status: DC | PRN
  Administered 2021-09-30: 1000 mL

## 2021-09-30 MED ORDER — BISACODYL 10 MG RE SUPP: 10.0000 mg | Freq: Every day | RECTAL | Status: DC | PRN

## 2021-09-30 MED ORDER — POVIDONE-IODINE 10 % EX SWAB
2.0000 "application " | Freq: Once | CUTANEOUS | Status: AC
  Administered 2021-09-30: 2 via TOPICAL

## 2021-09-30 MED ORDER — IRBESARTAN 150 MG PO TABS
300.0000 mg | ORAL_TABLET | Freq: Every day | ORAL | Status: DC
  Administered 2021-10-01 – 2021-10-09 (×9): 300 mg via ORAL
  Filled 2021-09-30 (×9): qty 2

## 2021-09-30 MED ORDER — DOCUSATE SODIUM 100 MG PO CAPS
100.0000 mg | ORAL_CAPSULE | Freq: Two times a day (BID) | ORAL | Status: DC
  Administered 2021-09-30 – 2021-10-09 (×18): 100 mg via ORAL
  Filled 2021-09-30 (×18): qty 1

## 2021-09-30 MED ORDER — ACETAMINOPHEN 10 MG/ML IV SOLN
1000.0000 mg | Freq: Four times a day (QID) | INTRAVENOUS | Status: DC
Start: 2021-09-30 — End: 2021-09-30
  Filled 2021-09-30: qty 100

## 2021-09-30 MED ORDER — MENTHOL 3 MG MT LOZG: 1.0000 | LOZENGE | OROMUCOSAL | Status: DC | PRN

## 2021-09-30 MED ORDER — VALSARTAN-HYDROCHLOROTHIAZIDE 320-12.5 MG PO TABS: 1.0000 | ORAL_TABLET | Freq: Every day | ORAL | Status: DC

## 2021-09-30 MED ORDER — METHIMAZOLE 5 MG PO TABS: 5.0000 mg | ORAL_TABLET | ORAL | Status: DC

## 2021-09-30 MED ORDER — POLYETHYLENE GLYCOL 3350 17 G PO PACK
17.0000 g | PACK | Freq: Every day | ORAL | Status: DC | PRN
  Administered 2021-10-06: 17 g via ORAL
  Filled 2021-09-30 (×3): qty 1

## 2021-09-30 MED ORDER — STERILE WATER FOR IRRIGATION IR SOLN
Status: DC | PRN
Start: 2021-09-30 — End: 2021-09-30
  Administered 2021-09-30: 2000 mL

## 2021-09-30 MED ORDER — MOMETASONE FURO-FORMOTEROL FUM 200-5 MCG/ACT IN AERO
2.0000 | INHALATION_SPRAY | Freq: Two times a day (BID) | RESPIRATORY_TRACT | Status: DC
  Administered 2021-09-30 – 2021-10-09 (×18): 2 via RESPIRATORY_TRACT
  Filled 2021-09-30 (×2): qty 8.8

## 2021-09-30 MED ORDER — BUPIVACAINE LIPOSOME 1.3 % IJ SUSP
INTRAMUSCULAR | Status: DC | PRN
  Administered 2021-09-30: 20 mL

## 2021-09-30 MED ORDER — FENTANYL CITRATE PF 50 MCG/ML IJ SOSY
50.0000 ug | PREFILLED_SYRINGE | Freq: Once | INTRAMUSCULAR | Status: AC
  Administered 2021-09-30: 50 ug via INTRAVENOUS
  Filled 2021-09-30: qty 2

## 2021-09-30 MED ORDER — METHIMAZOLE 10 MG PO TABS
10.0000 mg | ORAL_TABLET | ORAL | Status: DC
  Administered 2021-09-30 – 2021-10-08 (×5): 10 mg via ORAL
  Filled 2021-09-30 (×7): qty 1

## 2021-09-30 MED ORDER — ONDANSETRON HCL 4 MG/2ML IJ SOLN: 4.0000 mg | Freq: Four times a day (QID) | INTRAMUSCULAR | Status: DC | PRN

## 2021-09-30 SURGICAL SUPPLY — 53 items
BAG COUNTER SPONGE SURGICOUNT (BAG) IMPLANT
BAG SPEC THK2 15X12 ZIP CLS (MISCELLANEOUS) ×1
BAG SPNG CNTER NS LX DISP (BAG)
BAG ZIPLOCK 12X15 (MISCELLANEOUS) ×3 IMPLANT
BLADE SAG 18X100X1.27 (BLADE) ×3 IMPLANT
BLADE SAW SGTL 11.0X1.19X90.0M (BLADE) ×3 IMPLANT
BNDG ELASTIC 6X5.8 VLCR STR LF (GAUZE/BANDAGES/DRESSINGS) ×3 IMPLANT
BOWL SMART MIX CTS (DISPOSABLE) ×3 IMPLANT
CEMENT HV SMART SET (Cement) ×6 IMPLANT
CEMENT TIBIA MBT (Knees) IMPLANT
COVER SURGICAL LIGHT HANDLE (MISCELLANEOUS) ×3 IMPLANT
CUFF TOURN SGL QUICK 34 (TOURNIQUET CUFF) ×2
CUFF TRNQT CYL 34X4.125X (TOURNIQUET CUFF) ×2 IMPLANT
DECANTER SPIKE VIAL GLASS SM (MISCELLANEOUS) ×3 IMPLANT
DRAPE INCISE IOBAN 66X45 STRL (DRAPES) ×3 IMPLANT
DRAPE U-SHAPE 47X51 STRL (DRAPES) ×3 IMPLANT
DRSG AQUACEL AG ADV 3.5X10 (GAUZE/BANDAGES/DRESSINGS) ×3 IMPLANT
DURAPREP 26ML APPLICATOR (WOUND CARE) ×3 IMPLANT
ELECT REM PT RETURN 15FT ADLT (MISCELLANEOUS) ×3 IMPLANT
FEMUR SIGMA PS KNEE SZ 4.0N L (Femur) ×1 IMPLANT
GLOVE SRG 8 PF TXTR STRL LF DI (GLOVE) ×2 IMPLANT
GLOVE SURG ENC MOIS LTX SZ6.5 (GLOVE) ×3 IMPLANT
GLOVE SURG ENC MOIS LTX SZ8 (GLOVE) ×6 IMPLANT
GLOVE SURG UNDER POLY LF SZ7 (GLOVE) ×3 IMPLANT
GLOVE SURG UNDER POLY LF SZ8 (GLOVE) ×2
GLOVE SURG UNDER POLY LF SZ8.5 (GLOVE) ×3 IMPLANT
GOWN STRL REUS W/TWL LRG LVL3 (GOWN DISPOSABLE) ×6 IMPLANT
GOWN STRL REUS W/TWL XL LVL3 (GOWN DISPOSABLE) ×3 IMPLANT
HANDPIECE INTERPULSE COAX TIP (DISPOSABLE) ×2
HOLDER FOLEY CATH W/STRAP (MISCELLANEOUS) IMPLANT
IMMOBILIZER KNEE 20 (SOFTGOODS) ×2 IMPLANT
IMMOBILIZER KNEE 20 THIGH 36 (SOFTGOODS) ×2 IMPLANT
KIT TURNOVER KIT A (KITS) IMPLANT
MANIFOLD NEPTUNE II (INSTRUMENTS) ×3 IMPLANT
NS IRRIG 1000ML POUR BTL (IV SOLUTION) ×3 IMPLANT
PACK TOTAL KNEE CUSTOM (KITS) ×3 IMPLANT
PADDING CAST COTTON 6X4 STRL (CAST SUPPLIES) ×5 IMPLANT
PATELLA DOME PFC 38MM (Knees) ×1 IMPLANT
PLATE ROT INSERT 15MM SIZE 4 (Plate) ×1 IMPLANT
PROTECTOR NERVE ULNAR (MISCELLANEOUS) ×3 IMPLANT
SET HNDPC FAN SPRY TIP SCT (DISPOSABLE) ×2 IMPLANT
SPONGE T-LAP 18X18 ~~LOC~~+RFID (SPONGE) ×9 IMPLANT
STRIP CLOSURE SKIN 1/2X4 (GAUZE/BANDAGES/DRESSINGS) ×5 IMPLANT
SUT MNCRL AB 4-0 PS2 18 (SUTURE) ×3 IMPLANT
SUT STRATAFIX 0 PDS 27 VIOLET (SUTURE) ×2
SUT VIC AB 2-0 CT1 27 (SUTURE) ×6
SUT VIC AB 2-0 CT1 TAPERPNT 27 (SUTURE) ×6 IMPLANT
SUTURE STRATFX 0 PDS 27 VIOLET (SUTURE) ×2 IMPLANT
TIBIA MBT CEMENT (Knees) ×2 IMPLANT
TRAY FOLEY MTR SLVR 16FR STAT (SET/KITS/TRAYS/PACK) ×3 IMPLANT
TUBE SUCTION HIGH CAP CLEAR NV (SUCTIONS) ×3 IMPLANT
WATER STERILE IRR 1000ML POUR (IV SOLUTION) ×6 IMPLANT
WRAP KNEE MAXI GEL POST OP (GAUZE/BANDAGES/DRESSINGS) ×3 IMPLANT

## 2021-09-30 NOTE — Discharge Instructions (Addendum)
 Frank Aluisio, MD Total Joint Specialist [facility] [address] [place], [place] [address] [phone]  TOTAL KNEE REPLACEMENT POSTOPERATIVE DIRECTIONS    Knee Rehabilitation, Guidelines Following Surgery  Results after knee surgery are often greatly improved when you follow the exercise, range of motion and muscle strengthening exercises prescribed by your doctor. Safety measures are also important to protect the knee from further injury. If any of these exercises cause you to have increased pain or swelling in your knee joint, decrease the amount until you are comfortable again and slowly increase them. If you have problems or questions, call your caregiver or physical therapist for advice.   BLOOD CLOT PREVENTION Take a 10 mg Xarelto once a day for three weeks following surgery. Then resume one 81 mg aspirin once a day. You may resume your vitamins/supplements once you have discontinued the Xarelto. Do not take any NSAIDs (Advil, Aleve, Ibuprofen, Meloxicam, etc.) until you have discontinued the Xarelto.   HOME CARE INSTRUCTIONS  Remove items at home which could result in a fall. This includes throw rugs or furniture in walking pathways.  ICE to the affected knee as much as tolerated. Icing helps control swelling. If the swelling is well controlled you will be more comfortable and rehab easier. Continue to use ice on the knee for pain and swelling from surgery. You may notice swelling that will progress down to the foot and ankle. This is normal after surgery. Elevate the leg when you are not up walking on it.    Continue to use the breathing machine which will help keep your temperature down. It is common for your temperature to cycle up and down following surgery, especially at night when you are not up moving around and exerting yourself. The breathing machine keeps your lungs expanded and your temperature down. Do not place pillow under the operative  knee, focus on keeping the knee straight while resting  DIET You may resume your previous home diet once you are discharged from the hospital.  DRESSING / WOUND CARE / SHOWERING Keep your bulky bandage on for 2 days. On the third post-operative day you may remove the Ace bandage and gauze. There is a waterproof adhesive bandage on your skin which will stay in place until your first follow-up appointment. Once you remove this you will not need to place another bandage You may begin showering 3 days following surgery, but do not submerge the incision under water.  ACTIVITY For the first 5 days, the key is rest and control of pain and swelling Do your home exercises twice a day starting on post-operative day 3. On the days you go to physical therapy, just do the home exercises once that day. You should rest, ice and elevate the leg for 50 minutes out of every hour. Get up and walk/stretch for 10 minutes per hour. After 5 days you can increase your activity slowly as tolerated. Walk with your walker as instructed. Use the walker until you are comfortable transitioning to a cane. Walk with the cane in the opposite hand of the operative leg. You may discontinue the cane once you are comfortable and walking steadily. Avoid periods of inactivity such as sitting longer than an hour when not asleep. This helps prevent blood clots.  You may discontinue the knee immobilizer once you are able to perform a straight leg raise while lying down. You may resume a sexual relationship in one month or when given the OK by your   doctor.  You may return to work once you are cleared by your doctor.  Do not drive a car for 6 weeks or until released by your surgeon.  Do not drive while taking narcotics.  TED HOSE STOCKINGS Wear the elastic stockings on both legs for three weeks following surgery during the day. You may remove them at night for sleeping.  WEIGHT BEARING Weight bearing as tolerated with assist device  (walker, cane, etc) as directed, use it as long as suggested by your surgeon or therapist, typically at least 4-6 weeks.  POSTOPERATIVE CONSTIPATION PROTOCOL Constipation - defined medically as fewer than three stools per week and severe constipation as less than one stool per week.  One of the most common issues patients have following surgery is constipation.  Even if you have a regular bowel pattern at home, your normal regimen is likely to be disrupted due to multiple reasons following surgery.  Combination of anesthesia, postoperative narcotics, change in appetite and fluid intake all can affect your bowels.  In order to avoid complications following surgery, here are some recommendations in order to help you during your recovery period.  Colace (docusate) - Pick up an over-the-counter form of Colace or another stool softener and take twice a day as long as you are requiring postoperative pain medications.  Take with a full glass of water daily.  If you experience loose stools or diarrhea, hold the colace until you stool forms back up. If your symptoms do not get better within 1 week or if they get worse, check with your doctor. Dulcolax (bisacodyl) - Pick up over-the-counter and take as directed by the product packaging as needed to assist with the movement of your bowels.  Take with a full glass of water.  Use this product as needed if not relieved by Colace only.  MiraLax (polyethylene glycol) - Pick up over-the-counter to have on hand. MiraLax is a solution that will increase the amount of water in your bowels to assist with bowel movements.  Take as directed and can mix with a glass of water, juice, soda, coffee, or tea. Take if you go more than two days without a movement. Do not use MiraLax more than once per day. Call your doctor if you are still constipated or irregular after using this medication for 7 days in a row.  If you continue to have problems with postoperative constipation, please  contact the office for further assistance and recommendations.  If you experience "the worst abdominal pain ever" or develop nausea or vomiting, please contact the office immediatly for further recommendations for treatment.  ITCHING If you experience itching with your medications, try taking only a single pain pill, or even half a pain pill at a time.  You can also use Benadryl over the counter for itching or also to help with sleep.   MEDICATIONS See your medication summary on the "After Visit Summary" that the nursing staff will review with you prior to discharge.  You may have some home medications which will be placed on hold until you complete the course of blood thinner medication.  It is important for you to complete the blood thinner medication as prescribed by your surgeon.  Continue your approved medications as instructed at time of discharge.  PRECAUTIONS If you experience chest pain or shortness of breath - call 911 immediately for transfer to the hospital emergency department.  If you develop a fever greater that 101 F, purulent drainage from wound, increased redness or   drainage from wound, foul odor from the wound/dressing, or calf pain - CONTACT YOUR SURGEON.                                                   FOLLOW-UP APPOINTMENTS Make sure you keep all of your appointments after your operation with your surgeon and caregivers. You should call the office at the above phone number and make an appointment for approximately two weeks after the date of your surgery or on the date instructed by your surgeon outlined in the "After Visit Summary".  RANGE OF MOTION AND STRENGTHENING EXERCISES  Rehabilitation of the knee is important following a knee injury or an operation. After just a few days of immobilization, the muscles of the thigh which control the knee become weakened and shrink (atrophy). Knee exercises are designed to build up the tone and strength of the thigh muscles and to  improve knee motion. Often times heat used for twenty to thirty minutes before working out will loosen up your tissues and help with improving the range of motion but do not use heat for the first two weeks following surgery. These exercises can be done on a training (exercise) mat, on the floor, on a table or on a bed. Use what ever works the best and is most comfortable for you Knee exercises include:  Leg Lifts - While your knee is still immobilized in a splint or cast, you can do straight leg raises. Lift the leg to 60 degrees, hold for 3 sec, and slowly lower the leg. Repeat 10-20 times 2-3 times daily. Perform this exercise against resistance later as your knee gets better.  Quad and Hamstring Sets - Tighten up the muscle on the front of the thigh (Quad) and hold for 5-10 sec. Repeat this 10-20 times hourly. Hamstring sets are done by pushing the foot backward against an object and holding for 5-10 sec. Repeat as with quad sets.  Leg Slides: Lying on your back, slowly slide your foot toward your buttocks, bending your knee up off the floor (only go as far as is comfortable). Then slowly slide your foot back down until your leg is flat on the floor again. Angel Wings: Lying on your back spread your legs to the side as far apart as you can without causing discomfort.  A rehabilitation program following serious knee injuries can speed recovery and prevent re-injury in the future due to weakened muscles. Contact your doctor or a physical therapist for more information on knee rehabilitation.   POST-OPERATIVE OPIOID TAPER INSTRUCTIONS: It is important to wean off of your opioid medication as soon as possible. If you do not need pain medication after your surgery it is ok to stop day one. Opioids include: Codeine, Hydrocodone(Norco, Vicodin), Oxycodone(Percocet, oxycontin) and hydromorphone amongst others.  Long term and even short term use of opiods can cause: Increased pain  response Dependence Constipation Depression Respiratory depression And more.  Withdrawal symptoms can include Flu like symptoms Nausea, vomiting And more Techniques to manage these symptoms Hydrate well Eat regular healthy meals Stay active Use relaxation techniques(deep breathing, meditating, yoga) Do Not substitute Alcohol to help with tapering If you have been on opioids for less than two weeks and do not have pain than it is ok to stop all together.  Plan to wean off of opioids This plan   should start within one week post op of your joint replacement. Maintain the same interval or time between taking each dose and first decrease the dose.  Cut the total daily intake of opioids by one tablet each day Next start to increase the time between doses. The last dose that should be eliminated is the evening dose.   IF YOU ARE TRANSFERRED TO A SKILLED REHAB FACILITY If the patient is transferred to a skilled rehab facility following release from the hospital, a list of the current medications will be sent to the facility for the patient to continue.  When discharged from the skilled rehab facility, please have the facility set up the patient's Home Health Physical Therapy prior to being released. Also, the skilled facility will be responsible for providing the patient with their medications at time of release from the facility to include their pain medication, the muscle relaxants, and their blood thinner medication. If the patient is still at the rehab facility at time of the two week follow up appointment, the skilled rehab facility will also need to assist the patient in arranging follow up appointment in our office and any transportation needs.  MAKE SURE YOU:  Understand these instructions.  Get help right away if you are not doing well or get worse.   DENTAL ANTIBIOTICS:  In most cases prophylactic antibiotics for Dental procdeures after total joint surgery are not  necessary.  Exceptions are as follows:  1. History of prior total joint infection  2. Severely immunocompromised (Organ Transplant, cancer chemotherapy, Rheumatoid biologic meds such as Humera)  3. Poorly controlled diabetes (A1C &gt; 8.0, blood glucose over 200)  If you have one of these conditions, contact your surgeon for an antibiotic prescription, prior to your dental procedure.    Pick up stool softner and laxative for home use following surgery while on pain medications. Do not submerge incision under water. Please use good hand washing techniques while changing dressing each day. May shower starting three days after surgery. Please use a clean towel to pat the incision dry following showers. Continue to use ice for pain and swelling after surgery. Do not use any lotions or creams on the incision until instructed by your surgeon.  ________________________________________  Information on my medicine - XARELTO (Rivaroxaban)  This medication education was reviewed with me or my healthcare representative as part of my discharge preparation.    Why was Xarelto prescribed for you? Xarelto was prescribed for you to reduce the risk of blood clots forming after orthopedic surgery. The medical term for these abnormal blood clots is venous thromboembolism (VTE).  What do you need to know about xarelto ? Take your Xarelto ONCE DAILY at the same time every day. You may take it either with or without food.  If you have difficulty swallowing the tablet whole, you may crush it and mix in applesauce just prior to taking your dose.  Take Xarelto exactly as prescribed by your doctor and DO NOT stop taking Xarelto without talking to the doctor who prescribed the medication.  Stopping without other VTE prevention medication to take the place of Xarelto may increase your risk of developing a clot.  After discharge, you should have regular check-up appointments with your healthcare  provider that is prescribing your Xarelto.    What do you do if you miss a dose? If you miss a dose, take it as soon as you remember on the same day then continue your regularly scheduled once daily regimen the   next day. Do not take two doses of Xarelto on the same day.   Important Safety Information A possible side effect of Xarelto is bleeding. You should call your healthcare provider right away if you experience any of the following: Bleeding from an injury or your nose that does not stop. Unusual colored urine (red or dark brown) or unusual colored stools (red or black). Unusual bruising for unknown reasons. A serious fall or if you hit your head (even if there is no bleeding).  Some medicines may interact with Xarelto and might increase your risk of bleeding while on Xarelto. To help avoid this, consult your healthcare provider or pharmacist prior to using any new prescription or non-prescription medications, including herbals, vitamins, non-steroidal anti-inflammatory drugs (NSAIDs) and supplements.  This website has more information on Xarelto: www.xarelto.com.    

## 2021-09-30 NOTE — Transfer of Care (Signed)
Immediate Anesthesia Transfer of Care Note  Patient: Ariel Lambert  Procedure(s) Performed: Procedure(s): TOTAL KNEE ARTHROPLASTY (Left)  Patient Location: PACU  Anesthesia Type:Spinal  Level of Consciousness: awake, alert  and oriented  Airway & Oxygen Therapy: Patient Spontanous Breathing  Post-op Assessment: Report given to RN and Post -op Vital signs reviewed and stable  Post vital signs: Reviewed and stable  Last Vitals:  Vitals:   09/30/21 1214 09/30/21 1219  BP:  (!) 149/98  Pulse:  69  Resp: 18 (!) 8  Temp:    SpO2:  98%    Complications: No apparent anesthesia complications

## 2021-09-30 NOTE — Anesthesia Postprocedure Evaluation (Signed)
Anesthesia Post Note  Patient: Ariel Lambert  Procedure(s) Performed: TOTAL KNEE ARTHROPLASTY (Left: Knee)     Patient location during evaluation: PACU Anesthesia Type: Spinal and Regional Level of consciousness: oriented and awake and alert Pain management: pain level controlled Vital Signs Assessment: post-procedure vital signs reviewed and stable Respiratory status: spontaneous breathing and respiratory function stable Cardiovascular status: blood pressure returned to baseline and stable Postop Assessment: no headache, no backache, no apparent nausea or vomiting, spinal receding and patient able to bend at knees Anesthetic complications: no   No notable events documented.  Last Vitals:  Vitals:   09/30/21 1615 09/30/21 1630  BP: (!) 162/79 (!) 153/89  Pulse: (!) 58 60  Resp: 12 16  Temp:    SpO2: 99% 93%    Last Pain:  Vitals:   09/30/21 1630  TempSrc:   PainSc: 0-No pain                 Jandy Brackens,W. EDMOND

## 2021-09-30 NOTE — Progress Notes (Signed)
Orthopedic Tech Progress Note Patient Details:  Ariel Lambert Mar 28, 1936 [id number]  CPM Left Knee Left Knee Flexion (Degrees): 40 Left Knee Extension (Degrees): 10  Post Interventions Patient Tolerated: Well  Darleen Crocker 09/30/2021, 5:41 PM

## 2021-09-30 NOTE — Interval H&P Note (Signed)
History and Physical Interval Note:  09/30/2021 11:47 AM  Ariel Lambert  has presented today for surgery, with the diagnosis of left knee osteoarthritis.  The various methods of treatment have been discussed with the patient and family. After consideration of risks, benefits and other options for treatment, the patient has consented to  Procedure(s): TOTAL KNEE ARTHROPLASTY (Left) as a surgical intervention.  The patient's history has been reviewed, patient examined, no change in status, stable for surgery.  I have reviewed the patient's chart and labs.  Questions were answered to the patient's satisfaction.     Ariel Lambert

## 2021-09-30 NOTE — Op Note (Signed)
OPERATIVE REPORT-TOTAL KNEE ARTHROPLASTY   Pre-operative diagnosis- Osteoarthritis  Left knee(s)  Post-operative diagnosis- Osteoarthritis Left knee(s)  Procedure-  Left  Total Knee Arthroplasty  Surgeon- Gus Rankin. Nole Robey, MD  Assistant- Leilani Able, PA-C   Anesthesia-   Adductor canal block and spinal  EBL- 25 ml   Drains None  Tourniquet time-  Total Tourniquet Time Documented: Thigh (Left) - 31 minutes Total: Thigh (Left) - 31 minutes     Complications- None  Condition-PACU - hemodynamically stable.   Brief Clinical Note  Ariel Lambert is a 86 y.o. year old female with end stage OA of her left knee with progressively worsening pain and dysfunction. She has constant pain, with activity and at rest and significant functional deficits with difficulties even with ADLs. She has had extensive non-op management including analgesics, injections of cortisone and viscosupplements, and home exercise program, but remains in significant pain with significant dysfunction. Radiographs show bone on bone arthritis lateral and patellofemoral. She presents now for left Total Knee Arthroplasty.     Procedure in detail---   The patient is brought into the operating room and positioned supine on the operating table. After successful administration of  Adductor canal block and spinal,   a tourniquet is placed high on the  Left thigh(s) and the lower extremity is prepped and draped in the usual sterile fashion. Time out is performed by the operating team and then the  Left lower extremity is wrapped in Esmarch, knee flexed and the tourniquet inflated to 300 mmHg.       A midline incision is made with a ten blade through the subcutaneous tissue to the level of the extensor mechanism. A fresh blade is used to make a medial parapatellar arthrotomy. Soft tissue over the proximal medial tibia is subperiosteally elevated to the joint line with a knife and into the semimembranosus bursa with a  Cobb elevator. Soft tissue over the proximal lateral tibia is elevated with attention being paid to avoiding the patellar tendon on the tibial tubercle. The patella is everted, knee flexed 90 degrees and the ACL and PCL are removed. Findings are bone on bone lateral and patellofemoral with large global osteophytes.        The drill is used to create a starting hole in the distal femur and the canal is thoroughly irrigated with sterile saline to remove the fatty contents. The 5 degree Left  valgus alignment guide is placed into the femoral canal and the distal femoral cutting block is pinned to remove 10 mm off the distal femur. Resection is made with an oscillating saw.      The tibia is subluxed forward and the menisci are removed. The extramedullary alignment guide is placed referencing proximally at the medial aspect of the tibial tubercle and distally along the second metatarsal axis and tibial crest. The block is pinned to remove 35mm off the more deficient lateral  side. Resection is made with an oscillating saw. Size 3is the most appropriate size for the tibia and the proximal tibia is prepared with the modular drill and keel punch for that size.      The femoral sizing guide is placed and size 4 is most appropriate. Rotation is marked off the epicondylar axis and confirmed by creating a rectangular flexion gap at 90 degrees. The size 4 cutting block is pinned in this rotation and the anterior, posterior and chamfer cuts are made with the oscillating saw. The intercondylar block is then placed and that cut  is made.      Trial size 3 tibial component, trial size 4 narrow posterior stabilized femur and a 15  mm posterior stabilized rotating platform insert trial is placed. Full extension is achieved with excellent varus/valgus and anterior/posterior balance throughout full range of motion. The patella is everted and thickness measured to be 22  mm. Free hand resection is taken to 12 mm, a 38 template is  placed, lug holes are drilled, trial patella is placed, and it tracks normally. Osteophytes are removed off the posterior femur with the trial in place. All trials are removed and the cut bone surfaces prepared with pulsatile lavage. Cement is mixed and once ready for implantation, the size 3 tibial implant, size  4 narrow posterior stabilized femoral component, and the size 38 patella are cemented in place and the patella is held with the clamp. The trial insert is placed and the knee held in full extension. The Exparel (20 ml mixed with 60 ml saline) is injected into the extensor mechanism, posterior capsule, medial and lateral gutters and subcutaneous tissues.  All extruded cement is removed and once the cement is hard the permanent 15 mm posterior stabilized rotating platform insert is placed into the tibial tray.      The wound is copiously irrigated with saline solution and the extensor mechanism closed with # 0 Stratofix suture. The tourniquet is released for a total tourniquet time of 31  minutes. Flexion against gravity is 140 degrees and the patella tracks normally. Subcutaneous tissue is closed with 2.0 vicryl and subcuticular with running 4.0 Monocryl. The incision is cleaned and dried and steri-strips and a bulky sterile dressing are applied. The limb is placed into a knee immobilizer and the patient is awakened and transported to recovery in stable condition.      Please note that a surgical assistant was a medical necessity for this procedure in order to perform it in a safe and expeditious manner. Surgical assistant was necessary to retract the ligaments and vital neurovascular structures to prevent injury to them and also necessary for proper positioning of the limb to allow for anatomic placement of the prosthesis.   Dione Plover Macio Kissoon, MD    09/30/2021, 2:54 PM

## 2021-09-30 NOTE — Anesthesia Procedure Notes (Signed)
Spinal  Patient location during procedure: OR Start time: 09/30/2021 3:55 AM Reason for block: surgical anesthesia Staffing Performed: resident/CRNA  Anesthesiologist: Roderic Palau, MD Resident/CRNA: Gerald Leitz, CRNA Preanesthetic Checklist Completed: patient identified, IV checked, site marked, risks and benefits discussed, surgical consent, monitors and equipment checked, pre-op evaluation and timeout performed Spinal Block Patient position: sitting Prep: DuraPrep Patient monitoring: heart rate, continuous pulse ox, blood pressure and cardiac monitor Approach: midline Location: L3-4 Injection technique: single-shot Needle Needle type: Whitacre and Introducer  Needle gauge: 24 G Needle length: 9 cm Assessment Sensory level: T4 Events: CSF return Additional Notes Negative paresthesia. Negative blood return. Positive free-flowing CSF. Expiration date of kit checked and confirmed. Patient tolerated procedure well, without complications.

## 2021-09-30 NOTE — Progress Notes (Signed)
Assisted Dr. Edmond Fitzgerald with left, ultrasound guided, adductor canal block. Side rails up, monitors on throughout procedure. See vital signs in flow sheet. Tolerated Procedure well. 

## 2021-09-30 NOTE — Care Plan (Signed)
Ortho Bundle Case Management Note  Patient Details  Name: Ariel Lambert MRN: [id number] Date of Birth: 01/19/1936  L TKA on 09-30-21 DCP:  Home with grandson and dtr DME:  No needs, has a RW PT:  [facility] on 10-03-21                   DME Arranged:  N/A DME Agency:  NA  HH Arranged:  NA HH Agency:  NA  Additional Comments: Please contact me with any questions of if this plan should need to change.  Marianne Sofia, RN,CCM [facility]  [phone] 09/30/2021, 1:39 PM

## 2021-09-30 NOTE — Anesthesia Procedure Notes (Signed)
Anesthesia Regional Block: Adductor canal block   Pre-Anesthetic Checklist: , timeout performed,  Correct Patient, Correct Site, Correct Laterality,  Correct Procedure, Correct Position, site marked,  Risks and benefits discussed,  Pre-op evaluation,  At surgeon's request and post-op pain management  Laterality: Left  Prep: Maximum Sterile Barrier Precautions used, chloraprep       Needles:  Injection technique: Single-shot  Needle Type: Echogenic Stimulator Needle     Needle Length: 9cm  Needle Gauge: 21     Additional Needles:   Procedures:,,,, ultrasound used (permanent image in chart),,    Narrative:  Start time: 09/30/2021 12:10 PM End time: 09/30/2021 12:20 PM Injection made incrementally with aspirations every 5 mL.  Performed by: Personally  Anesthesiologist: Gaynelle Adu, MD

## 2021-10-01 LAB — CBC
HCT: 35.5 % — ABNORMAL LOW (ref 36.0–46.0)
Hemoglobin: 11.4 g/dL — ABNORMAL LOW (ref 12.0–15.0)
MCH: 33.1 pg (ref 26.0–34.0)
MCHC: 32.1 g/dL (ref 30.0–36.0)
MCV: 103.2 fL — ABNORMAL HIGH (ref 80.0–100.0)
Platelets: 192 10*3/uL (ref 150–400)
RBC: 3.44 MIL/uL — ABNORMAL LOW (ref 3.87–5.11)
RDW: 13.9 % (ref 11.5–15.5)
WBC: 7.7 10*3/uL (ref 4.0–10.5)
nRBC: 0 % (ref 0.0–0.2)

## 2021-10-01 LAB — BASIC METABOLIC PANEL
Anion gap: 6 (ref 5–15)
BUN: 14 mg/dL (ref 8–23)
CO2: 26 mmol/L (ref 22–32)
Calcium: 8.9 mg/dL (ref 8.9–10.3)
Chloride: 105 mmol/L (ref 98–111)
Creatinine, Ser: 0.62 mg/dL (ref 0.44–1.00)
GFR, Estimated: >60 mL/min (ref >60)
Glucose, Bld: 162 mg/dL — ABNORMAL HIGH (ref 70–99)
Potassium: 4.4 mmol/L (ref 3.5–5.1)
Sodium: 137 mmol/L (ref 135–145)

## 2021-10-01 MED ORDER — CHLORHEXIDINE GLUCONATE CLOTH 2 % EX PADS: 6.0000 | MEDICATED_PAD | Freq: Every day | CUTANEOUS | Status: DC

## 2021-10-01 MED ORDER — EZETIMIBE 10 MG PO TABS
10.0000 mg | ORAL_TABLET | Freq: Every day | ORAL | Status: DC
  Administered 2021-10-01 – 2021-10-08 (×8): 10 mg via ORAL
  Filled 2021-10-01 (×8): qty 1

## 2021-10-01 MED ORDER — SIMVASTATIN 20 MG PO TABS
20.0000 mg | ORAL_TABLET | Freq: Every day | ORAL | Status: DC
  Administered 2021-10-01 – 2021-10-08 (×8): 20 mg via ORAL
  Filled 2021-10-01 (×8): qty 1

## 2021-10-01 MED ORDER — HYDRALAZINE HCL 20 MG/ML IJ SOLN: 10.0000 mg | Freq: Four times a day (QID) | INTRAMUSCULAR | Status: DC | PRN

## 2021-10-01 NOTE — TOC Transition Note (Signed)
Transition of Care [facility]) - CM/SW Discharge Note  Patient Details  Name: Ariel Lambert MRN: [id number] Date of Birth: [birth year]  Transition of [facility]) CM/SW Contact:  Sherie Don, LCSW Phone Number: 10/01/2021, 10:00 AM  Clinical Narrative: Patient is expected to discharge home after working with PT. CSW met with patient to confirm discharge plan. Patient will discharge home with OPPT at [facility] with the first appointment scheduled for 10/03/21. Patient has a rolling walker, shower chair, cane, and toilet riser at home so there are no DME needs at this time.  Final next level of care: OP Rehab Barriers to Discharge: No Barriers Identified  Patient Goals and CMS Choice Patient states their goals for this hospitalization and ongoing recovery are:: Discharge home with OPPT at [facility] Choice offered to / list presented to : NA  Discharge Plan and Services         DME Arranged: N/A DME Agency: NA HH Arranged: NA HH Agency: NA  Readmission Risk Interventions No flowsheet data found.

## 2021-10-01 NOTE — Progress Notes (Signed)
Subjective: 1 Day Post-Op Procedure(s) (LRB): TOTAL KNEE ARTHROPLASTY (Left) Patient reports pain as moderate.   Patient seen in rounds by Dr. Lequita Halt. Patient is doing fair, issues with pain. Has not required IV pain medication at this point, used reduced dose of dilaudid PO due to patient's age. Denies chest pain or SOB. Foley catheter removed this AM. We will begin therapy today.  Objective: Vital signs in last 24 hours: Temp:  [97.4 F (36.3 C)-98.2 F (36.8 C)] 97.6 F (36.4 C) (01/17 0502) Pulse Rate:  [53-78] 63 (01/17 0502) Resp:  [8-20] 17 (01/17 0502) BP: (119-190)/(72-141) 190/110 (01/17 0647) SpO2:  [91 %-100 %] 98 % (01/17 0502) Weight:  [59.3 kg-63.1 kg] 63.1 kg (01/16 1931)  Intake/Output from previous day:  Intake/Output Summary (Last 24 hours) at 10/01/2021 0821 Last data filed at 10/01/2021 0641 Gross per 24 hour  Intake 3250 ml  Output 2255 ml  Net 995 ml     Intake/Output this shift: No intake/output data recorded.  Labs: Recent Labs    10/01/21 0306  HGB 11.4*   Recent Labs    10/01/21 0306  WBC 7.7  RBC 3.44*  HCT 35.5*  PLT 192   Recent Labs    10/01/21 0306  NA 137  K 4.4  CL 105  CO2 26  BUN 14  CREATININE 0.62  GLUCOSE 162*  CALCIUM 8.9   No results for input(s): LABPT, INR in the last 72 hours.  Exam: General - Patient is Alert and Oriented Extremity - Neurologically intact Neurovascular intact Sensation intact distally Dorsiflexion/Plantar flexion intact Dressing - dressing C/D/I Motor Function - intact, moving foot and toes well on exam.   Past Medical History:  Diagnosis Date   Allergy    Arthritis    ASTHMA 05/03/2009   Asthma    Carotid stenosis    Chronic kidney disease    " in remission"   COPD (chronic obstructive pulmonary disease) (HCC)    CVA (cerebral infarction)    Dyslipidemia    Headache    Hearing aid worn    B/L   HYPERTENSION 04/30/2007   HYPERTHYROIDISM 06/17/2007   Hyperthyroidism     Menopause    Multinodular goiter    OSTEOPOROSIS 04/30/2007   Stroke (HCC)    Wears glasses     Assessment/Plan: 1 Day Post-Op Procedure(s) (LRB): TOTAL KNEE ARTHROPLASTY (Left) Principal Problem:   Primary osteoarthritis of left knee  Estimated body mass index is 24.64 kg/m as calculated from the following:   Height as of this encounter: 5\' 3"  (1.6 m).   Weight as of this encounter: 63.1 kg. Advance diet Up with therapy D/C IV fluids  Anticipated LOS equal to or greater than 2 midnights due to - Age 84 and older with one or more of the following:  - Obesity  - Expected need for hospital services (PT, OT, Nursing) required for safe  discharge  - Anticipated need for postoperative skilled nursing care or inpatient rehab  - Active co-morbidities: Stroke and Respiratory Failure/COPD OR   - Unanticipated findings during/Post Surgery: None  - Patient is a high risk of re-admission due to: None   DVT Prophylaxis - Xarelto Weight bearing as tolerated. Begin therapy.  BP elevated this AM, home medication valsartan-HCTZ scheduled to be restarted at 1000. Additionally ordered IV hydralazine PRN.   Possible discharge today if doing exceptionally well with therapy. Will most likely stay until tomorrow though given patient's age and medical history.   Plan is  to go Home after hospital stay.  Arther Abbott, PA-C Orthopedic Surgery [phone] 10/01/2021, 8:21 AM

## 2021-10-01 NOTE — Progress Notes (Signed)
Physical Therapy Treatment Patient Details Name: Ariel Lambert MRN: [id number] DOB: 04-16-1936 Today's Date: 10/01/2021   History of Present Illness 86 yo female s/p L TKA 10/01/21. Hx of CVA, osteoporosis, asthma    PT Comments    Progressing slowly with mobility. Will continue to follow and progress activity as tolerated. Possible d/c home tomorrow afternoon after stair training if pt does well and pain is controlled.    Recommendations for follow up therapy are one component of a multi-disciplinary discharge planning process, led by the attending physician.  Recommendations may be updated based on patient status, additional functional criteria and insurance authorization.  Follow Up Recommendations  Follow physician's recommendations for discharge plan and follow up therapies     Assistance Recommended at Discharge Frequent or constant Supervision/Assistance  Patient can return home with the following Help with stairs or ramp for entrance;A little help with walking and/or transfers   Equipment Recommendations  None recommended by PT    Recommendations for Other Services       Precautions / Restrictions Precautions Precautions: Fall Precaution Comments: incontinent Restrictions Weight Bearing Restrictions: No LLE Weight Bearing: Weight bearing as tolerated     Mobility  Bed Mobility Overal bed mobility: Needs Assistance Bed Mobility: Supine to Sit, Sit to Supine     Supine to sit: Min assist, HOB elevated Sit to supine: Min assist, HOB elevated   General bed mobility comments: Assist for trunk and L LE. Increased time. Cues for safety, technique.    Transfers Overall transfer level: Needs assistance Equipment used: Rolling walker (2 wheels) Transfers: Sit to/from Stand Sit to Stand: Mod assist           General transfer comment: Assist to rise, steady, control descent. Cues for safety, technique, hand/LEplacement. Increased time.     Ambulation/Gait Ambulation/Gait assistance: Min assist Gait Distance (Feet): 40 Feet Assistive device: Rolling walker (2 wheels) Gait Pattern/deviations: Step-to pattern       General Gait Details: Assist to stabilize pt throughout distance and to intermittently manage RW. Ambulation distance limited by pain.   Stairs             Wheelchair Mobility    Modified Rankin (Stroke Patients Only)       Balance Overall balance assessment: Needs assistance         Standing balance support: Bilateral upper extremity supported Standing balance-Leahy Scale: Poor                              Cognition Arousal/Alertness: Awake/alert Behavior During Therapy: WFL for tasks assessed/performed Overall Cognitive Status: Within Functional Limits for tasks assessed                                          Exercises Total Joint Exercises Ankle Circles/Pumps: AROM, Both, 10 reps Quad Sets: AROM, Both, 10 reps Heel Slides: AAROM, Left, 10 reps Hip ABduction/ADduction: AAROM, Left, 10 reps Straight Leg Raises: AAROM, Left, 10 reps Goniometric ROM: ~10-60 degrees    General Comments        Pertinent Vitals/Pain Pain Assessment Pain Assessment: 0-10 Pain Score: 7  Pain Location: L knee Pain Descriptors / Indicators: Discomfort, Sore Pain Intervention(s): Limited activity within patient's tolerance, Monitored during session, Repositioned, Ice applied    Home Living Family/patient expects to be discharged to:: Private residence Living Arrangements:  Other relatives (lives with grandson) Available Help at Discharge: Family (daughter coming in to help) Type of Home: House Home Access: Stairs to enter Entrance Stairs-Rails: Left Entrance Stairs-Number of Steps: 3   Home Layout: One level Home Equipment: Cane - single point;Shower seat;BSC/3in1;Rolling Environmental consultant (2 wheels)      Prior Function            PT Goals (current goals can now be  found in the care plan section) Acute Rehab PT Goals Patient Stated Goal: less pain. regain independence PT Goal Formulation: With patient Time For Goal Achievement: 10/15/21 Potential to Achieve Goals: Good Progress towards PT goals: Progressing toward goals    Frequency    7X/week      PT Plan Current plan remains appropriate    Co-evaluation              AM-PAC PT "6 Clicks" Mobility   Outcome Measure  Help needed turning from your back to your side while in a flat bed without using bedrails?: A Little Help needed moving from lying on your back to sitting on the side of a flat bed without using bedrails?: A Little Help needed moving to and from a bed to a chair (including a wheelchair)?: A Little Help needed standing up from a chair using your arms (e.g., wheelchair or bedside chair)?: A Lot Help needed to walk in hospital room?: A Little Help needed climbing 3-5 steps with a railing? : A Lot 6 Click Score: 16    End of Session Equipment Utilized During Treatment: Gait belt Activity Tolerance: Patient tolerated treatment well;Patient limited by pain Patient left: with call bell/phone within reach;in bed;with family/visitor present   PT Visit Diagnosis: Muscle weakness (generalized) (M62.81);Difficulty in walking, not elsewhere classified (R26.2)     Time: 8295-6213 PT Time Calculation (min) (ACUTE ONLY): 22 min  Charges:  $Gait Training: 8-22 mins              Faye Ramsay, PT Acute Rehabilitation  Office: [phone] Pager: [phone]

## 2021-10-01 NOTE — Evaluation (Addendum)
Physical Therapy Evaluation Patient Details Name: Ariel Lambert MRN: [id number] DOB: 06/13/1936 Today's Date: 10/01/2021  History of Present Illness  86 yo female s/p L TKA 10/01/21. Hx of CVA, osteoporosis, asthma  Clinical Impression  On eval, pt was Mod A for mobility. She walked ~25 feet with a RW. LOB x 1 while ambulating-pt reports she has a history of balance deficits. Encouraged pt to sit up as tolerated and to try to get up with nursing to use bathroom to increase activity. Will plan to follow and progress activity as tolerated.      Recommendations for follow up therapy are one component of a multi-disciplinary discharge planning process, led by the attending physician.  Recommendations may be updated based on patient status, additional functional criteria and insurance authorization.  Follow Up Recommendations Follow physician's recommendations for discharge plan and follow up therapies    Assistance Recommended at Discharge Frequent or constant Supervision/Assistance  Patient can return home with the following  Help with stairs or ramp for entrance;A little help with walking and/or transfers    Equipment Recommendations None recommended by PT  Recommendations for Other Services       Functional Status Assessment Patient has had a recent decline in their functional status and demonstrates the ability to make significant improvements in function in a reasonable and predictable amount of time.     Precautions / Restrictions Precautions Precautions: Fall Precaution Comments: incontinent Restrictions Weight Bearing Restrictions: No LLE Weight Bearing: Weight bearing as tolerated      Mobility  Bed Mobility Overal bed mobility: Needs Assistance Bed Mobility: Supine to Sit     Supine to sit: Min assist, HOB elevated     General bed mobility comments: Assist for L LE. Increased time. Cues for safety, technique.    Transfers Overall transfer level: Needs  assistance Equipment used: Rolling walker (2 wheels) Transfers: Sit to/from Stand Sit to Stand: Mod assist, From elevated surface           General transfer comment: Assist to rise, steady, control descent. Cues for safety, technique, hand/LEplacement    Ambulation/Gait Ambulation/Gait assistance: Min assist Gait Distance (Feet): 25 Feet Assistive device: Rolling walker (2 wheels) Gait Pattern/deviations: Step-to pattern       General Gait Details: Assist to stabilize pt and maneuver RW. LOB x 1 with pt releasing grasp on walker to try to grab door frame (cautioned pt against this). Followed with recliner  Stairs            Wheelchair Mobility    Modified Rankin (Stroke Patients Only)       Balance Overall balance assessment: Needs assistance         Standing balance support: Bilateral upper extremity supported Standing balance-Leahy Scale: Poor                               Pertinent Vitals/Pain Pain Assessment Pain Assessment: No/denies pain Pain Score: 7  Pain Location: L knee Pain Descriptors / Indicators: Discomfort, Sore Pain Intervention(s): Limited activity within patient's tolerance, Monitored during session, Repositioned    Home Living Family/patient expects to be discharged to:: Private residence Living Arrangements: Other relatives (lives with grandson) Available Help at Discharge: Family (daughter coming in to help) Type of Home: House Home Access: Stairs to enter Entrance Stairs-Rails: Left Entrance Stairs-Number of Steps: 3   Home Layout: One level Home Equipment: Cane - single point;Shower seat;BSC/3in1;Rolling Environmental consultant (2 wheels)  Prior Function Prior Level of Function : Independent/Modified Independent             Mobility Comments: using cane for ambulation       Hand Dominance        Extremity/Trunk Assessment   Upper Extremity Assessment Upper Extremity Assessment: Generalized weakness    Lower  Extremity Assessment Lower Extremity Assessment: Generalized weakness    Cervical / Trunk Assessment Cervical / Trunk Assessment: Normal  Communication   Communication: No difficulties  Cognition Arousal/Alertness: Awake/alert Behavior During Therapy: WFL for tasks assessed/performed Overall Cognitive Status: Within Functional Limits for tasks assessed                                          General Comments      Exercises Total Joint Exercises Ankle Circles/Pumps: AROM, Both, 10 reps Quad Sets: AROM, Both, 10 reps Heel Slides: AAROM, Left, 10 reps Hip ABduction/ADduction: AAROM, Left, 10 reps Straight Leg Raises: AAROM, Left, 10 reps Goniometric ROM: ~10-60 degrees   Assessment/Plan    PT Assessment Patient needs continued PT services  PT Problem List Decreased strength;Decreased mobility;Decreased range of motion;Decreased activity tolerance;Decreased balance;Decreased knowledge of use of DME       PT Treatment Interventions DME instruction;Gait training;Therapeutic activities;Therapeutic exercise;Patient/family education;Balance training;Functional mobility training    PT Goals (Current goals can be found in the Care Plan section)  Acute Rehab PT Goals Patient Stated Goal: less pain. regain independence PT Goal Formulation: With patient Time For Goal Achievement: 10/15/21 Potential to Achieve Goals: Good    Frequency 7X/week     Co-evaluation               AM-PAC PT "6 Clicks" Mobility  Outcome Measure Help needed turning from your back to your side while in a flat bed without using bedrails?: A Little Help needed moving from lying on your back to sitting on the side of a flat bed without using bedrails?: A Little Help needed moving to and from a bed to a chair (including a wheelchair)?: A Little Help needed standing up from a chair using your arms (e.g., wheelchair or bedside chair)?: A Lot Help needed to walk in hospital room?: A  Little Help needed climbing 3-5 steps with a railing? : A Lot 6 Click Score: 16    End of Session Equipment Utilized During Treatment: Gait belt Activity Tolerance: Patient tolerated treatment well Patient left: in chair;with call bell/phone within reach   PT Visit Diagnosis: Muscle weakness (generalized) (M62.81);Difficulty in walking, not elsewhere classified (R26.2)    Time: [id number] PT Time Calculation (min) (ACUTE ONLY): 35 min   Charges:   PT Evaluation $PT Eval Moderate Complexity: 1 Low PT Treatments $Gait Training: 8-22 mins           Doreatha Massed, PT Acute Rehabilitation  Office: [phone] Pager: [phone]

## 2021-10-02 LAB — CBC
HCT: 31.4 % — ABNORMAL LOW (ref 36.0–46.0)
Hemoglobin: 10 g/dL — ABNORMAL LOW (ref 12.0–15.0)
MCH: 32.8 pg (ref 26.0–34.0)
MCHC: 31.8 g/dL (ref 30.0–36.0)
MCV: 103 fL — ABNORMAL HIGH (ref 80.0–100.0)
Platelets: 178 10*3/uL (ref 150–400)
RBC: 3.05 MIL/uL — ABNORMAL LOW (ref 3.87–5.11)
RDW: 14.2 % (ref 11.5–15.5)
WBC: 9.1 10*3/uL (ref 4.0–10.5)
nRBC: 0 % (ref 0.0–0.2)

## 2021-10-02 LAB — BASIC METABOLIC PANEL
Anion gap: 4 — ABNORMAL LOW (ref 5–15)
BUN: 13 mg/dL (ref 8–23)
CO2: 29 mmol/L (ref 22–32)
Calcium: 8.8 mg/dL — ABNORMAL LOW (ref 8.9–10.3)
Chloride: 104 mmol/L (ref 98–111)
Creatinine, Ser: 0.61 mg/dL (ref 0.44–1.00)
GFR, Estimated: >60 mL/min (ref >60)
Glucose, Bld: 100 mg/dL — ABNORMAL HIGH (ref 70–99)
Potassium: 4.4 mmol/L (ref 3.5–5.1)
Sodium: 137 mmol/L (ref 135–145)

## 2021-10-02 MED ORDER — METHOCARBAMOL 500 MG PO TABS: 500.0000 mg | ORAL_TABLET | Freq: Four times a day (QID) | ORAL | 0 refills | Status: DC | PRN

## 2021-10-02 MED ORDER — TRAMADOL HCL 50 MG PO TABS: 50.0000 mg | ORAL_TABLET | Freq: Four times a day (QID) | ORAL | 0 refills | Status: DC | PRN

## 2021-10-02 MED ORDER — RIVAROXABAN 10 MG PO TABS: 10.0000 mg | ORAL_TABLET | Freq: Every day | ORAL | 0 refills | Status: AC

## 2021-10-02 MED ORDER — HYDROMORPHONE HCL 2 MG PO TABS: 2.0000 mg | ORAL_TABLET | Freq: Four times a day (QID) | ORAL | 0 refills | Status: DC | PRN

## 2021-10-02 NOTE — Progress Notes (Signed)
° °  Subjective: 2 Days Post-Op Procedure(s) (LRB): TOTAL KNEE ARTHROPLASTY (Left) Patient reports pain as moderate.   Patient seen in rounds for Dr. Wynelle Link. Patient is complaining of pain in the left knee this morning, states she let the pain get ahead of her. Was given PO medication just prior to rounding this AM. Denies calf pain, chest pain or SOB. No issues overnight.  Plan is to go Home after hospital stay.  Objective: Vital signs in last 24 hours: Temp:  [97.6 F (36.4 C)-98.4 F (36.9 C)] 97.8 F (36.6 C) (01/18 0445) Pulse Rate:  [67-68] 68 (01/18 0445) Resp:  [17-20] 17 (01/18 0445) BP: (122-158)/(69-91) 152/91 (01/18 0445) SpO2:  [96 %-99 %] 98 % (01/18 0445)  Intake/Output from previous day:  Intake/Output Summary (Last 24 hours) at 10/02/2021 0923 Last data filed at 10/02/2021 0844 Gross per 24 hour  Intake 1010 ml  Output 1000 ml  Net 10 ml    Intake/Output this shift: Total I/O In: 300 [P.O.:300] Out: 200 [Urine:200]  Labs: Recent Labs    10/01/21 0306 10/02/21 0311  HGB 11.4* 10.0*   Recent Labs    10/01/21 0306 10/02/21 0311  WBC 7.7 9.1  RBC 3.44* 3.05*  HCT 35.5* 31.4*  PLT 192 178   Recent Labs    10/01/21 0306 10/02/21 0311  NA 137 137  K 4.4 4.4  CL 105 104  CO2 26 29  BUN 14 13  CREATININE 0.62 0.61  GLUCOSE 162* 100*  CALCIUM 8.9 8.8*   No results for input(s): LABPT, INR in the last 72 hours.  Exam: General - Patient is Alert and Oriented Extremity - Neurologically intact Neurovascular intact Sensation intact distally Dorsiflexion/Plantar flexion intact Dressing/Incision - clean, dry, no drainage. Bulky dressing removed, Aquacel in place. Motor Function - intact, moving foot and toes well on exam.   Past Medical History:  Diagnosis Date   Allergy    Arthritis    ASTHMA 05/03/2009   Asthma    Carotid stenosis    Chronic kidney disease    " in remission"   COPD (chronic obstructive pulmonary disease) (HCC)    CVA  (cerebral infarction)    Dyslipidemia    Headache    Hearing aid worn    B/L   HYPERTENSION 04/30/2007   HYPERTHYROIDISM 06/17/2007   Hyperthyroidism    Menopause    Multinodular goiter    OSTEOPOROSIS 04/30/2007   Stroke ([place])    Wears glasses     Assessment/Plan: 2 Days Post-Op Procedure(s) (LRB): TOTAL KNEE ARTHROPLASTY (Left) Principal Problem:   Primary osteoarthritis of left knee  Estimated body mass index is 24.64 kg/m as calculated from the following:   Height as of this encounter: 5\' 3"  (1.6 m).   Weight as of this encounter: 63.1 kg. Up with therapy  DVT Prophylaxis - Xarelto Weight-bearing as tolerated  Plan for discharge later today if meeting goals with therapy and pain controlled. Scheduled for OPPT at [facility] Follow-up in the office in 2 weeks  The PDMP database was reviewed today prior to any opioid medications being prescribed to this patient.  Theresa Duty, PA-C Orthopedic Surgery [phone] 10/02/2021, 9:23 AM

## 2021-10-02 NOTE — Progress Notes (Signed)
Physical Therapy Treatment Patient Details Name: Ariel Lambert MRN: [id number] DOB: July 14, 1936 Today's Date: 10/02/2021   History of Present Illness 86 yo female s/p L TKA 10/01/21. Hx of CVA, osteoporosis, asthma    PT Comments    Pt is progressing slowly. LOB x 1 while ambulating today. She is a fall risk when mobilizing. Pain rated 8/10 during session. Ambulation distance limited by pain and fatigue. Will plan to have a 2nd session to practice stair negotiation with family present. Unsure if pt will meet her PT goals on today-made RN aware.    Recommendations for follow up therapy are one component of a multi-disciplinary discharge planning process, led by the attending physician.  Recommendations may be updated based on patient status, additional functional criteria and insurance authorization.  Follow Up Recommendations  Follow physician's recommendations for discharge plan and follow up therapies     Assistance Recommended at Discharge Frequent or constant Supervision/Assistance  Patient can return home with the following A little help with walking and/or transfers;A little help with bathing/dressing/bathroom;Help with stairs or ramp for entrance   Equipment Recommendations  None recommended by PT    Recommendations for Other Services       Precautions / Restrictions Precautions Precautions: Fall Precaution Comments: incontinent Restrictions Weight Bearing Restrictions: No LLE Weight Bearing: Weight bearing as tolerated     Mobility  Bed Mobility               General bed mobility comments: oob in recliner    Transfers Overall transfer level: Needs assistance Equipment used: Rolling walker (2 wheels) Transfers: Sit to/from Stand Sit to Stand: Min assist           General transfer comment: Assist to rise, steady, control descent. Cues for safety, technique, hand/LEplacement. Increased time.    Ambulation/Gait Ambulation/Gait assistance: Min  assist Gait Distance (Feet): 30 Feet Assistive device: Rolling walker (2 wheels) Gait Pattern/deviations: Step-to pattern, Antalgic       General Gait Details: Assist to stabilize pt throughout distance. Near fall x 1 2* pt losing her balance and releasing her hold on the walker. Again instructed pt to keep hold of walker at all times. Ambulation distance limited by pain, fatigue.   Stairs             Wheelchair Mobility    Modified Rankin (Stroke Patients Only)       Balance Overall balance assessment: Needs assistance         Standing balance support: Bilateral upper extremity supported Standing balance-Leahy Scale: Poor                              Cognition Arousal/Alertness: Awake/alert Behavior During Therapy: WFL for tasks assessed/performed Overall Cognitive Status: Within Functional Limits for tasks assessed                                          Exercises Total Joint Exercises Ankle Circles/Pumps: AROM, Both, 10 reps Quad Sets: AROM, Left, 10 reps Heel Slides: AAROM, Left, 10 reps Hip ABduction/ADduction: AAROM, Left, 10 reps Straight Leg Raises: AAROM, Left, 10 reps Goniometric ROM: ~10-60 degrees    General Comments        Pertinent Vitals/Pain Pain Assessment Pain Assessment: 0-10 Pain Score: 8  Pain Location: L knee Pain Descriptors / Indicators: Discomfort, Sore Pain Intervention(s):  Limited activity within patient's tolerance, Monitored during session, Ice applied, Repositioned    Home Living                          Prior Function            PT Goals (current goals can now be found in the care plan section) Progress towards PT goals: Progressing toward goals    Frequency    7X/week      PT Plan Current plan remains appropriate    Co-evaluation              AM-PAC PT "6 Clicks" Mobility   Outcome Measure  Help needed turning from your back to your side while in a flat  bed without using bedrails?: A Little Help needed moving from lying on your back to sitting on the side of a flat bed without using bedrails?: A Little Help needed moving to and from a bed to a chair (including a wheelchair)?: A Little Help needed standing up from a chair using your arms (e.g., wheelchair or bedside chair)?: A Little Help needed to walk in hospital room?: A Little Help needed climbing 3-5 steps with a railing? : A Lot 6 Click Score: 17    End of Session Equipment Utilized During Treatment: Gait belt Activity Tolerance: Patient limited by fatigue;Patient limited by pain Patient left: in chair;with call bell/phone within reach   PT Visit Diagnosis: Muscle weakness (generalized) (M62.81);Difficulty in walking, not elsewhere classified (R26.2)     Time: 0350-0938 PT Time Calculation (min) (ACUTE ONLY): 34 min  Charges:  $Gait Training: 8-22 mins $Therapeutic Exercise: 8-22 mins              Faye Ramsay, PT Acute Rehabilitation  Office: [phone] Pager: [phone]

## 2021-10-02 NOTE — Progress Notes (Signed)
Physical Therapy Treatment Patient Details Name: Ariel Lambert MRN: [id number] DOB: 28-Jan-1936 Today's Date: 10/02/2021   History of Present Illness 86 yo female s/p L TKA 10/01/21. Hx of CVA, osteoporosis, asthma    PT Comments    Pt experiencing significant pain for 2nd session. She was unable to safely perform stair training. Unfortunately pt has been unable to meet her PT goals on today. Will continue to follow and progress activity as tolerated.     Recommendations for follow up therapy are one component of a multi-disciplinary discharge planning process, led by the attending physician.  Recommendations may be updated based on patient status, additional functional criteria and insurance authorization.  Follow Up Recommendations  Follow physician's recommendations for discharge plan and follow up therapies     Assistance Recommended at Discharge Frequent or constant Supervision/Assistance  Patient can return home with the following A little help with walking and/or transfers;A little help with bathing/dressing/bathroom;Help with stairs or ramp for entrance   Equipment Recommendations  None recommended by PT    Recommendations for Other Services       Precautions / Restrictions Precautions Precautions: Fall;Knee Precaution Comments: incontinent Restrictions Weight Bearing Restrictions: No LLE Weight Bearing: Weight bearing as tolerated     Mobility  Bed Mobility Overal bed mobility: Needs Assistance             General bed mobility comments: oob in recliner    Transfers Overall transfer level: Needs assistance Equipment used: Rolling walker (2 wheels) Transfers: Sit to/from Stand Sit to Stand: Min assist           General transfer comment: Assist to rise, steady, control descent. Cues for safety, technique, hand/LEplacement. Increased time.    Ambulation/Gait Ambulation/Gait assistance: Min assist Gait Distance (Feet): 35 Feet Assistive  device: Rolling walker (2 wheels) Gait Pattern/deviations: Step-to pattern, Trunk flexed, Antalgic       General Gait Details: Assist to stabilize pt throughout distance. Ambulation distance limited by pain, fatigue. Daughter present to walk along using gait belt to practice.   Stairs Stairs:  (Attempted to go up stairs using 2 hands on 1 rail-pt unable. Deferred further attempt 2* pain)           Wheelchair Mobility    Modified Rankin (Stroke Patients Only)       Balance Overall balance assessment: Needs assistance         Standing balance support: Bilateral upper extremity supported Standing balance-Leahy Scale: Poor                              Cognition Arousal/Alertness: Awake/alert Behavior During Therapy: WFL for tasks assessed/performed Overall Cognitive Status: Within Functional Limits for tasks assessed                                          Exercises Total Joint Exercises Ankle Circles/Pumps: AROM, Both, 10 reps Quad Sets: AROM, Left, 10 reps Heel Slides: AAROM, Left, 10 reps Hip ABduction/ADduction: AAROM, Left, 10 reps Straight Leg Raises: AAROM, Left, 10 reps Goniometric ROM: ~10-60 degrees    General Comments        Pertinent Vitals/Pain Pain Assessment Pain Assessment: 0-10 Pain Score: 9  Pain Location: L knee Pain Descriptors / Indicators: Discomfort, Sore, Grimacing, Moaning Pain Intervention(s): Limited activity within patient's tolerance, Monitored during session, Ice applied, Repositioned  Home Living                          Prior Function            PT Goals (current goals can now be found in the care plan section) Progress towards PT goals: Progressing toward goals    Frequency    7X/week      PT Plan Current plan remains appropriate    Co-evaluation              AM-PAC PT "6 Clicks" Mobility   Outcome Measure  Help needed turning from your back to your side  while in a flat bed without using bedrails?: A Little Help needed moving from lying on your back to sitting on the side of a flat bed without using bedrails?: A Little Help needed moving to and from a bed to a chair (including a wheelchair)?: A Little Help needed standing up from a chair using your arms (e.g., wheelchair or bedside chair)?: A Little Help needed to walk in hospital room?: A Little Help needed climbing 3-5 steps with a railing? : A Lot 6 Click Score: 17    End of Session Equipment Utilized During Treatment: Gait belt Activity Tolerance: Patient limited by fatigue;Patient limited by pain Patient left: in chair;with call bell/phone within reach;with family/visitor present   PT Visit Diagnosis: Muscle weakness (generalized) (M62.81);Difficulty in walking, not elsewhere classified (R26.2)     Time: 2426-8341 PT Time Calculation (min) (ACUTE ONLY): 25 min  Charges:  $Gait Training: 23-37 mins              Faye Ramsay, PT Acute Rehabilitation  Office: [phone] Pager: [phone]

## 2021-10-02 NOTE — Plan of Care (Signed)
°  Problem: Pain Management: °Goal: Pain level will decrease with appropriate interventions °Outcome: Progressing °  °Problem: Coping: °Goal: Level of anxiety will decrease °Outcome: Progressing °  °Problem: Safety: °Goal: Ability to remain free from injury will improve °Outcome: Progressing °  °

## 2021-10-02 NOTE — Plan of Care (Signed)

## 2021-10-03 ENCOUNTER — Encounter (HOSPITAL_COMMUNITY): Payer: Self-pay | Admitting: Orthopedic Surgery

## 2021-10-03 LAB — CBC
HCT: 32.4 % — ABNORMAL LOW (ref 36.0–46.0)
Hemoglobin: 10.5 g/dL — ABNORMAL LOW (ref 12.0–15.0)
MCH: 33.2 pg (ref 26.0–34.0)
MCHC: 32.4 g/dL (ref 30.0–36.0)
MCV: 102.5 fL — ABNORMAL HIGH (ref 80.0–100.0)
Platelets: 186 10*3/uL (ref 150–400)
RBC: 3.16 MIL/uL — ABNORMAL LOW (ref 3.87–5.11)
RDW: 14.4 % (ref 11.5–15.5)
WBC: 8.4 10*3/uL (ref 4.0–10.5)
nRBC: 0 % (ref 0.0–0.2)

## 2021-10-03 MED ORDER — ACETAMINOPHEN 325 MG PO TABS
650.0000 mg | ORAL_TABLET | Freq: Four times a day (QID) | ORAL | Status: DC | PRN
  Administered 2021-10-03 – 2021-10-09 (×9): 650 mg via ORAL
  Filled 2021-10-03 (×9): qty 2

## 2021-10-03 MED ORDER — OXYCODONE HCL 5 MG PO TABS
5.0000 mg | ORAL_TABLET | Freq: Four times a day (QID) | ORAL | Status: DC | PRN
  Administered 2021-10-03 – 2021-10-04 (×3): 5 mg via ORAL
  Filled 2021-10-03 (×3): qty 1

## 2021-10-03 NOTE — Plan of Care (Signed)
  Problem: Activity: Goal: Range of joint motion will improve Outcome: Progressing   Problem: Pain Management: Goal: Pain level will decrease with appropriate interventions Outcome: Progressing   Problem: Safety: Goal: Ability to remain free from injury will improve Outcome: Progressing   

## 2021-10-03 NOTE — Progress Notes (Signed)
° °  Subjective: 3 Days Post-Op Procedure(s) (LRB): TOTAL KNEE ARTHROPLASTY (Left) Patient reports pain as mild.   Patient seen in rounds by Dr. Lequita Halt. Patient is  doing better this morning and her pain is more under control than yesterday. Denies SOB, chest pain, or calf pain. No acute overnight events. Ambulated 35 feet with therapy yesterday. Was unable to perform stair training with PT. Will continue therapy today.   Plan is to go Home after hospital stay.  Objective: Vital signs in last 24 hours: Temp:  [97.7 F (36.5 C)-99.1 F (37.3 C)] 99.1 F (37.3 C) (01/19 0651) Pulse Rate:  [68-96] 75 (01/19 0651) Resp:  [15-18] 18 (01/19 0651) BP: (126-155)/(64-75) 139/73 (01/19 0651) SpO2:  [94 %-99 %] 96 % (01/19 0651)  Intake/Output from previous day:  Intake/Output Summary (Last 24 hours) at 10/03/2021 0730 Last data filed at 10/03/2021 0600 Gross per 24 hour  Intake 1250 ml  Output 1300 ml  Net -50 ml    Intake/Output this shift: No intake/output data recorded.  Labs: Recent Labs    10/01/21 0306 10/02/21 0311 10/03/21 0300  HGB 11.4* 10.0* 10.5*   Recent Labs    10/02/21 0311 10/03/21 0300  WBC 9.1 8.4  RBC 3.05* 3.16*  HCT 31.4* 32.4*  PLT 178 186   Recent Labs    10/01/21 0306 10/02/21 0311  NA 137 137  K 4.4 4.4  CL 105 104  CO2 26 29  BUN 14 13  CREATININE 0.62 0.61  GLUCOSE 162* 100*  CALCIUM 8.9 8.8*   No results for input(s): LABPT, INR in the last 72 hours.  Exam: General - Patient is Alert and Oriented Extremity - Neurologically intact Neurovascular intact Sensation intact distally Dorsiflexion/Plantar flexion intact Dressing/Incision - clean, dry, no drainage Motor Function - intact, moving foot and toes well on exam.   Past Medical History:  Diagnosis Date   Allergy    Arthritis    ASTHMA 05/03/2009   Asthma    Carotid stenosis    Chronic kidney disease    " in remission"   COPD (chronic obstructive pulmonary disease) (HCC)     CVA (cerebral infarction)    Dyslipidemia    Headache    Hearing aid worn    B/L   HYPERTENSION 04/30/2007   HYPERTHYROIDISM 06/17/2007   Hyperthyroidism    Menopause    Multinodular goiter    OSTEOPOROSIS 04/30/2007   Stroke (HCC)    Wears glasses     Assessment/Plan: 3 Days Post-Op Procedure(s) (LRB): TOTAL KNEE ARTHROPLASTY (Left) Principal Problem:   Primary osteoarthritis of left knee  Estimated body mass index is 24.64 kg/m as calculated from the following:   Height as of this encounter: 5\' 3"  (1.6 m).   Weight as of this encounter: 63.1 kg. Up with therapy  DVT Prophylaxis - Xarelto and TED hose Weight-bearing as tolerated  Plan for two sessions with PT today, and if meeting goals with PT, and if her pain is under control, will plan for discharge this afternoon.   Patient to follow up in two weeks with Dr. in clinic.   The PDMP database was reviewed today prior to any opioid medications being prescribed to this patient.Lequita Halt, MBA, PA-C Orthopedic Surgery [phone] 10/03/2021, 7:30 AM

## 2021-10-03 NOTE — Progress Notes (Signed)
Physical Therapy Treatment Patient Details Name: Ariel Lambert MRN: [id number] DOB: [birth year] Today's Date: 10/03/2021   History of Present Illness 86 yo female s/p L TKA 10/01/21. Hx of CVA, osteoporosis, asthma    PT Comments    POD # 3 pm session Pt not progressing well today due to pain control.  Pt is requiring + 2 assist to toilet safely.  Daughter [name] present during session.  Assisted out of recliner to [facility] then amb a limited distance.  General Gait Details: very limited amb distance around bed approx 8 feet.  Very unsteady.  Great difficulty fully WBing onto L LE due to pain.  Poor flex posture.  HIGH FALL RISK. Assisted back to bed and positioned to comfort. Pt is scheduled to go to OP PT.  This would be a very difficult task for her to tolerate along with transportation.  Pt would benefit from [facility] PT and HEP instructions/handout to daughter. Pt did NOT meet her mobility goals to safely D/C to home today.  Recommendations for follow up therapy are one component of a multi-disciplinary discharge planning process, led by the attending physician.  Recommendations may be updated based on patient status, additional functional criteria and insurance authorization.  Follow Up Recommendations  Follow physician's recommendations for discharge plan and follow up therapies     Assistance Recommended at Discharge    Patient can return home with the following A little help with walking and/or transfers;A little help with bathing/dressing/bathroom;Help with stairs or ramp for entrance;Assistance with cooking/housework;Assist for transportation;A lot of help with bathing/dressing/bathroom   Equipment Recommendations  Rolling walker (2 wheels)    Recommendations for Other Services       Precautions / Restrictions Precautions Precautions: Fall;Knee Precaution Comments: incontinent Restrictions Weight Bearing Restrictions: No LLE Weight Bearing: Weight bearing as tolerated      Mobility  Bed Mobility Overal bed mobility: Needs Assistance Bed Mobility: Sit to Supine     Supine to sit: Mod assist Sit to supine: Mod assist, Max assist   General bed mobility comments: pt required Mod/Max Assist back to bed due to weakness/fatigue    Transfers Overall transfer level: Needs assistance Equipment used: Rolling walker (2 wheels) Transfers: Sit to/from Stand Sit to Stand: Mod assist, +2 physical assistance, +2 safety/equipment           General transfer comment: pt required increased assist of + 2 this session due to increased pain.  Assisted from recliner to [facility] 1/4 pivot was very difficult.  Very unsteady.  Required Total Care for peri care after voiding.    Ambulation/Gait Ambulation/Gait assistance: Mod assist Gait Distance (Feet): 8 Feet Assistive device: Rolling walker (2 wheels) Gait Pattern/deviations: Step-to pattern, Trunk flexed, Antalgic Gait velocity: decreased     General Gait Details: very limited amb distance around bed approx 8 feet.  Very unsteady.  Great difficulty fully WBing onto L LE due to pain.  Poor flex posture.  HIGH FALL RISK.   Stairs             Wheelchair Mobility    Modified Rankin (Stroke Patients Only)       Balance                                            Cognition Arousal/Alertness: Awake/alert Behavior During Therapy: WFL for tasks assessed/performed Overall Cognitive Status: Within Functional Limits for  tasks assessed                                 General Comments: AxO x 3 in a lot pain and required max ecourage,ent and increased time.        Exercises      General Comments        Pertinent Vitals/Pain Pain Assessment Pain Assessment: 0-10 Pain Score: 8  Pain Location: L knee Pain Descriptors / Indicators: Discomfort, Sore, Grimacing, Moaning Pain Intervention(s): Monitored during session, Premedicated before session, Repositioned, Ice applied     Home Living                          Prior Function            PT Goals (current goals can now be found in the care plan section) Progress towards PT goals: Progressing toward goals    Frequency    7X/week      PT Plan Current plan remains appropriate    Co-evaluation              AM-PAC PT "6 Clicks" Mobility   Outcome Measure  Help needed turning from your back to your side while in a flat bed without using bedrails?: A Lot Help needed moving from lying on your back to sitting on the side of a flat bed without using bedrails?: A Lot Help needed moving to and from a bed to a chair (including a wheelchair)?: A Lot Help needed standing up from a chair using your arms (e.g., wheelchair or bedside chair)?: A Lot Help needed to walk in hospital room?: A Lot Help needed climbing 3-5 steps with a railing? : Total 6 Click Score: 11    End of Session   Activity Tolerance: Patient limited by fatigue;Patient limited by pain Patient left: in chair;with call bell/phone within reach;with family/visitor present Nurse Communication: Mobility status PT Visit Diagnosis: Muscle weakness (generalized) (M62.81);Difficulty in walking, not elsewhere classified (R26.2)     Time: 1320-1350 PT Time Calculation (min) (ACUTE ONLY): 30 min  Charges:  $Gait Training: 8-22 mins $Therapeutic Activity: 8-22 mins                     {Eldor Conaway  PTA Acute  Rehabilitation Services Pager      [phone] Office      [phone]

## 2021-10-03 NOTE — Progress Notes (Signed)
Physical Therapy Treatment Patient Details Name: Ariel Lambert MRN: [id number] DOB: 1936/01/23 Today's Date: 10/03/2021   History of Present Illness 86 yo female s/p L TKA 10/01/21. Hx of CVA, osteoporosis, asthma    PT Comments    POD #3 Pt exhibiting increased pain after (Block + Experal) have worn off.  Pain meds changed to OXY starting today but given in mid Therapy session.  Daughter Ariel Lambert present for education on safe handling and mobility. General bed mobility comments: Had Daughter "hands on" assist pt OOB to EOB with Instruction on safe handing and tech by Therapist.  Pt unable to lift leg on her own due to effort/pain. General transfer comment: pt required increased assist of + 2 this session due to increased pain.  Assisted from bed to [facility] 1/4 pivot was very difficult.  Very unsteady.  Required Total Care for peri care after voiding.General Gait Details: decreased amb distance this session due to pain level and effort. Positioned in recliner to comfort and applied ICE. Pt will need another PT session this afternoon.   Recommendations for follow up therapy are one component of a multi-disciplinary discharge planning process, led by the attending physician.  Recommendations may be updated based on patient status, additional functional criteria and insurance authorization.  Follow Up Recommendations  Follow physician's recommendations for discharge plan and follow up therapies     Assistance Recommended at Discharge    Patient can return home with the following A little help with walking and/or transfers;A little help with bathing/dressing/bathroom;Help with stairs or ramp for entrance;Assistance with cooking/housework;Assist for transportation;A lot of help with bathing/dressing/bathroom   Equipment Recommendations  Rolling walker (2 wheels)    Recommendations for Other Services       Precautions / Restrictions Precautions Precautions: Fall;Knee Precaution Comments:  incontinent Restrictions Weight Bearing Restrictions: No LLE Weight Bearing: Weight bearing as tolerated     Mobility  Bed Mobility Overal bed mobility: Needs Assistance Bed Mobility: Supine to Sit     Supine to sit: Mod assist     General bed mobility comments: Had Daughter "hands on" assist pt OOB to EOB with Instruction on safe handing and tech by Therapist.  Pt unable to lift leg on her own due to effort/pain.    Transfers Overall transfer level: Needs assistance Equipment used: Rolling walker (2 wheels) Transfers: Sit to/from Stand Sit to Stand: Mod assist, +2 physical assistance, +2 safety/equipment           General transfer comment: pt required increased assist of + 2 this session due to increased pain.  Assisted from bed to [facility] 1/4 pivot was very difficult.  Very unsteady.  Required Total Care for peri care after voiding.    Ambulation/Gait Ambulation/Gait assistance: Mod assist Gait Distance (Feet): 5 Feet Assistive device: Rolling walker (2 wheels) Gait Pattern/deviations: Step-to pattern, Trunk flexed, Antalgic Gait velocity: decreased     General Gait Details: decreased amb distance this session due to pain level and effort.   Stairs             Wheelchair Mobility    Modified Rankin (Stroke Patients Only)       Balance                                            Cognition Arousal/Alertness: Awake/alert Behavior During Therapy: WFL for tasks assessed/performed Overall Cognitive Status: Within  Functional Limits for tasks assessed                                 General Comments: AxO x 3 in a lot pain and required max ecourage,ent and increased time.        Exercises      General Comments        Pertinent Vitals/Pain Pain Assessment Pain Assessment: 0-10 Pain Score: 8  Pain Location: L knee Pain Descriptors / Indicators: Discomfort, Sore, Grimacing, Moaning Pain Intervention(s): Monitored  during session, Premedicated before session, Repositioned, Ice applied    Home Living                          Prior Function            PT Goals (current goals can now be found in the care plan section) Progress towards PT goals: Progressing toward goals    Frequency    7X/week      PT Plan Current plan remains appropriate    Co-evaluation              AM-PAC PT "6 Clicks" Mobility   Outcome Measure  Help needed turning from your back to your side while in a flat bed without using bedrails?: A Lot Help needed moving from lying on your back to sitting on the side of a flat bed without using bedrails?: A Lot Help needed moving to and from a bed to a chair (including a wheelchair)?: A Lot Help needed standing up from a chair using your arms (e.g., wheelchair or bedside chair)?: A Lot Help needed to walk in hospital room?: A Lot Help needed climbing 3-5 steps with a railing? : Total 6 Click Score: 11    End of Session   Activity Tolerance: Patient limited by fatigue;Patient limited by pain Patient left: in chair;with call bell/phone within reach;with family/visitor present Nurse Communication: Mobility status PT Visit Diagnosis: Muscle weakness (generalized) (M62.81);Difficulty in walking, not elsewhere classified (R26.2)     Time: [id number] PT Time Calculation (min) (ACUTE ONLY): 47 min  Charges:  $Gait Training: 8-22 mins $Therapeutic Activity: 23-37 mins                     Rica Koyanagi  PTA Acute  Rehabilitation Services Pager      [phone] Office      [phone]

## 2021-10-04 DIAGNOSIS — D649 Anemia, unspecified: Secondary | ICD-10-CM | POA: Diagnosis not present

## 2021-10-04 DIAGNOSIS — J449 Chronic obstructive pulmonary disease, unspecified: Secondary | ICD-10-CM | POA: Diagnosis not present

## 2021-10-04 DIAGNOSIS — E871 Hypo-osmolality and hyponatremia: Secondary | ICD-10-CM | POA: Diagnosis not present

## 2021-10-04 DIAGNOSIS — F05 Delirium due to known physiological condition: Secondary | ICD-10-CM | POA: Diagnosis not present

## 2021-10-04 DIAGNOSIS — I130 Hypertensive heart and chronic kidney disease with heart failure and stage 1 through stage 4 chronic kidney disease, or unspecified chronic kidney disease: Secondary | ICD-10-CM | POA: Diagnosis not present

## 2021-10-04 DIAGNOSIS — E669 Obesity, unspecified: Secondary | ICD-10-CM | POA: Diagnosis not present

## 2021-10-04 DIAGNOSIS — M1712 Unilateral primary osteoarthritis, left knee: Secondary | ICD-10-CM | POA: Diagnosis not present

## 2021-10-04 DIAGNOSIS — M810 Age-related osteoporosis without current pathological fracture: Secondary | ICD-10-CM | POA: Diagnosis present

## 2021-10-04 DIAGNOSIS — Z6824 Body mass index (BMI) 24.0-24.9, adult: Secondary | ICD-10-CM | POA: Diagnosis not present

## 2021-10-04 DIAGNOSIS — N390 Urinary tract infection, site not specified: Secondary | ICD-10-CM | POA: Diagnosis not present

## 2021-10-04 DIAGNOSIS — J9690 Respiratory failure, unspecified, unspecified whether with hypoxia or hypercapnia: Secondary | ICD-10-CM | POA: Diagnosis not present

## 2021-10-04 DIAGNOSIS — K769 Liver disease, unspecified: Secondary | ICD-10-CM | POA: Diagnosis not present

## 2021-10-04 DIAGNOSIS — E0520 Thyrotoxicosis with toxic multinodular goiter without thyrotoxic crisis or storm: Secondary | ICD-10-CM | POA: Diagnosis not present

## 2021-10-04 DIAGNOSIS — R4182 Altered mental status, unspecified: Secondary | ICD-10-CM | POA: Diagnosis not present

## 2021-10-04 DIAGNOSIS — G8929 Other chronic pain: Secondary | ICD-10-CM | POA: Diagnosis present

## 2021-10-04 DIAGNOSIS — E785 Hyperlipidemia, unspecified: Secondary | ICD-10-CM | POA: Diagnosis present

## 2021-10-04 DIAGNOSIS — G9341 Metabolic encephalopathy: Secondary | ICD-10-CM | POA: Diagnosis not present

## 2021-10-04 DIAGNOSIS — Z20822 Contact with and (suspected) exposure to covid-19: Secondary | ICD-10-CM | POA: Diagnosis not present

## 2021-10-04 DIAGNOSIS — F325 Major depressive disorder, single episode, in full remission: Secondary | ICD-10-CM | POA: Diagnosis present

## 2021-10-04 DIAGNOSIS — R0902 Hypoxemia: Secondary | ICD-10-CM | POA: Diagnosis not present

## 2021-10-04 DIAGNOSIS — E1122 Type 2 diabetes mellitus with diabetic chronic kidney disease: Secondary | ICD-10-CM | POA: Diagnosis not present

## 2021-10-04 MED ORDER — OXYCODONE HCL 5 MG PO TABS: 5.0000 mg | ORAL_TABLET | Freq: Four times a day (QID) | ORAL | 0 refills | Status: DC | PRN

## 2021-10-04 MED ORDER — ALUM & MAG HYDROXIDE-SIMETH 200-200-20 MG/5ML PO SUSP
15.0000 mL | Freq: Four times a day (QID) | ORAL | Status: DC | PRN
  Administered 2021-10-04: 15 mL via ORAL
  Filled 2021-10-04: qty 30

## 2021-10-04 MED ORDER — OXYCODONE HCL 5 MG PO TABS
5.0000 mg | ORAL_TABLET | Freq: Four times a day (QID) | ORAL | Status: DC | PRN
  Administered 2021-10-04 (×2): 5 mg via ORAL
  Administered 2021-10-05: 10 mg via ORAL
  Administered 2021-10-05: 5 mg via ORAL
  Administered 2021-10-05 – 2021-10-06 (×2): 10 mg via ORAL
  Filled 2021-10-04 (×2): qty 2
  Filled 2021-10-04 (×2): qty 1
  Filled 2021-10-04 (×2): qty 2
  Filled 2021-10-04: qty 1

## 2021-10-04 NOTE — Progress Notes (Addendum)
Physical Therapy Treatment Patient Details Name: Ariel Lambert MRN: [id number] DOB: [birth year] Today's Date: 10/04/2021   History of Present Illness 86 yo female s/p L TKA 10/01/21. Hx of CVA, osteoporosis, asthma    PT Comments    POD # 4 Pt is NOT thriving.  Continues to present with mobility decline.  Now requiring Total Assist + 2 for all transfers and mobility. General Comments: AxO x 1 requiring repeat functional commands.  She knew she was in the hospital but did not recall why.  Increased c/o weakness/fatigue.  Pt appears dehydrated and suken.  IV fluids restated and daughter brought in [employer].  Pt was unable to feed herself so daughter assisted. Pain is better controlled and pt has been alert/awake on/off most of the day but remains confused with functional tasks. Assisted pt back to bed was difficult and required + 2 Total Assist.  General transfer comment: pt required + 2 Total Assist this session to transfer out of recliner to [facility].  Pt c/o MAX weakness/fatigue.  Severe posterior lean and inability to self support upright.  Also great difficulty weight shifting and/or advancing eith LE to complete 1/4 turn.  Therapist guided hips and pivoted pt completely.  Pain was present but tolerable. Switched BSC and pulled bed from behind while pt stood.  Required Total Assist to stand and perform peri care. Positioned to comfort and elevated L LE. Pt is requiring + 2 assist and has NOT met her mobility goals to safely D/C to home.  Recommendations for follow up therapy are one component of a multi-disciplinary discharge planning process, led by the attending physician.  Recommendations may be updated based on patient status, additional functional criteria and insurance authorization.  Follow Up Recommendations  Skilled nursing-short term rehab (<3 hours/day)     Assistance Recommended at Discharge    Patient can return home with the following Two people to help with walking  and/or transfers;Two people to help with bathing/dressing/bathroom;Assist for transportation;Assistance with cooking/housework;Help with stairs or ramp for entrance;Direct supervision/assist for financial management;Assistance with feeding   Equipment Recommendations  Rolling walker (2 wheels)    Recommendations for Other Services       Precautions / Restrictions Precautions Precautions: Fall;Knee Precaution Comments: incontinent Restrictions Weight Bearing Restrictions: No LLE Weight Bearing: Weight bearing as tolerated     Mobility  Bed Mobility Overal bed mobility: Needs Assistance Bed Mobility: Sit to Supine       Sit to supine: Total assist, +2 for physical assistance, +2 for safety/equipment   General bed mobility comments: Pt required +2 Total Asisst back to bed and positioned to comfort.    Transfers Overall transfer level: Needs assistance Equipment used: Rolling walker (2 wheels) Transfers: Sit to/from Stand, Bed to chair/wheelchair/BSC Sit to Stand: Total assist, +2 physical assistance, +2 safety/equipment           General transfer comment: pt required + 2 Total Assist this session to transfer out of recliner to [facility].  Pt c/o MAX weakness/fatigue.  Severe posterior lean and inability to self support upright.  Also great difficulty weight shifting and/or advancing eith LE to complete 1/4 turn.  Therapist guided hips and pivoted pt completely.  Pain was present but tolerable. Switched BSC and pulled bed from behind while pt stood.  Required Total Assist to stand and perform peri care.    Ambulation/Gait   General Gait Details: transfers only this session due to + 2 Total Assist unable to attempt amb   Stairs  Wheelchair Mobility    Modified Rankin (Stroke Patients Only)       Balance                                            Cognition Arousal/Alertness: Awake/alert Behavior During Therapy: Flat affect Overall  Cognitive Status: Impaired/Different from baseline Area of Impairment: Orientation, Attention, Memory, Following commands, Safety/judgement, Awareness, Problem solving                 Orientation Level: Person     Following Commands: Follows one step commands inconsistently Safety/Judgement: Decreased awareness of safety, Decreased awareness of deficits     General Comments: AxO x 1 requiring repeat functional commands.  She knew she was in the hospital but did not recall why.  Increased c/o weakness/fatigue.  Pt appears dehydrated and suken.  Not sure how much she is eating/drinking past 4 days given her decreased level of cognition.        Exercises      General Comments        Pertinent Vitals/Pain Pain Assessment Pain Assessment: Faces Faces Pain Scale: Hurts little more Pain Location: L knee Pain Descriptors / Indicators: Discomfort, Sore, Grimacing, Operative site guarding Pain Intervention(s): Monitored during session, Repositioned, Premedicated before session    Home Living                          Prior Function            PT Goals (current goals can now be found in the care plan section) Progress towards PT goals: Progressing toward goals    Frequency    7X/week      PT Plan Current plan remains appropriate    Co-evaluation              AM-PAC PT "6 Clicks" Mobility   Outcome Measure  Help needed turning from your back to your side while in a flat bed without using bedrails?: Total Help needed moving from lying on your back to sitting on the side of a flat bed without using bedrails?: Total Help needed moving to and from a bed to a chair (including a wheelchair)?: Total Help needed standing up from a chair using your arms (e.g., wheelchair or bedside chair)?: Total Help needed to walk in hospital room?: Total Help needed climbing 3-5 steps with a railing? : Total 6 Click Score: 6    End of Session Equipment Utilized  During Treatment: Gait belt Activity Tolerance: Patient limited by fatigue Patient left: in bed;with bed alarm set;with call bell/phone within reach;with family/visitor present Nurse Communication: Mobility status PT Visit Diagnosis: Muscle weakness (generalized) (M62.81);Difficulty in walking, not elsewhere classified (R26.2)     Time: 4128-7867 PT Time Calculation (min) (ACUTE ONLY): 25 min  Charges:  $Gait Training: 8-22 mins $Therapeutic Activity: 23-37 mins                     {Jaslynn Thome  PTA Acute  Rehabilitation Services Pager      [phone] Office      [phone]

## 2021-10-04 NOTE — Plan of Care (Signed)
  Problem: Education: Goal: Knowledge of the prescribed therapeutic regimen will improve Outcome: Progressing   Problem: Activity: Goal: Ability to avoid complications of mobility impairment will improve Outcome: Progressing   Problem: Pain Management: Goal: Pain level will decrease with appropriate interventions Outcome: Progressing   

## 2021-10-04 NOTE — TOC Progression Note (Signed)
Transition of Care [facility]) - Progression Note   Patient Details  Name: Ariel Lambert MRN: [id number] Date of Birth: [birth year]  Transition of [facility] CM/SW Contact  Ewing Schlein, LCSW Phone Number: 10/04/2021, 12:18 PM  Clinical Narrative: Patient will now need to discharge home with HHPT and the preference is [facility]. [facility] accepted HHPT referral. Daughter is requesting a 3N1 and wheelchair. PA has placed order for 3N1, but would prefer that the patient not go home with a wheelchair. CSW made DME referral to Danielle with [employer]. [employer] to deliver 3N1 to patient's room. CSW updated daughter.  Expected Discharge Plan: Home w Home Health Services Barriers to Discharge: No Barriers Identified  Expected Discharge Plan and Services Expected Discharge Plan: Home w Home Health Services Post Acute Care Choice: Durable Medical Equipment, Home Health Expected Discharge Date: 10/04/21               DME Arranged: 3-N-1 DME Agency: [facility] Date DME Agency Contacted: 10/04/21 Time DME Agency Contacted: 1206 Representative spoke with at DME Agency: Selinda Flavin Arranged: PT HH Agency: [facility]  Readmission Risk Interventions No flowsheet data found.

## 2021-10-04 NOTE — Progress Notes (Signed)
° °  Subjective: 4 Days Post-Op Procedure(s) (LRB): TOTAL KNEE ARTHROPLASTY (Left) Patient reports pain as moderate.   Patient seen in rounds by Dr. Wynelle Link. Pain control continues to be an issue despite switching PO meds yesterday. Has had slower postoperative progression. Denies chest pain or SOB.  Plan is to go Home after hospital stay.  Objective: Vital signs in last 24 hours: Temp:  [98.9 F (37.2 C)-100.9 F (38.3 C)] 98.9 F (37.2 C) (01/20 0539) Pulse Rate:  [81-96] 90 (01/20 0539) Resp:  [18-20] 18 (01/20 0539) BP: (126-144)/(68-90) 144/76 (01/20 0539) SpO2:  [90 %-99 %] 90 % (01/20 0539)  Intake/Output from previous day:  Intake/Output Summary (Last 24 hours) at 10/04/2021 0723 Last data filed at 10/04/2021 0500 Gross per 24 hour  Intake 240 ml  Output 1500 ml  Net -1260 ml    Intake/Output this shift: No intake/output data recorded.  Labs: Recent Labs    10/02/21 0311 10/03/21 0300  HGB 10.0* 10.5*   Recent Labs    10/02/21 0311 10/03/21 0300  WBC 9.1 8.4  RBC 3.05* 3.16*  HCT 31.4* 32.4*  PLT 178 186   Recent Labs    10/02/21 0311  NA 137  K 4.4  CL 104  CO2 29  BUN 13  CREATININE 0.61  GLUCOSE 100*  CALCIUM 8.8*   No results for input(s): LABPT, INR in the last 72 hours.  Exam: General - Patient is Alert and Oriented Extremity - Neurologically intact Neurovascular intact Sensation intact distally Dorsiflexion/Plantar flexion intact Dressing/Incision - clean, dry, no drainage Motor Function - intact, moving foot and toes well on exam.   Past Medical History:  Diagnosis Date   Allergy    Arthritis    ASTHMA 05/03/2009   Asthma    Carotid stenosis    Chronic kidney disease    " in remission"   COPD (chronic obstructive pulmonary disease) (HCC)    CVA (cerebral infarction)    Dyslipidemia    Headache    Hearing aid worn    B/L   HYPERTENSION 04/30/2007   HYPERTHYROIDISM 06/17/2007   Hyperthyroidism    Menopause     Multinodular goiter    OSTEOPOROSIS 04/30/2007   Stroke ([place])    Wears glasses     Assessment/Plan: 4 Days Post-Op Procedure(s) (LRB): TOTAL KNEE ARTHROPLASTY (Left) Principal Problem:   Primary osteoarthritis of left knee  Estimated body mass index is 24.64 kg/m as calculated from the following:   Height as of this encounter: 5\' 3"  (1.6 m).   Weight as of this encounter: 63.1 kg. Up with therapy  DVT Prophylaxis - Xarelto Weight-bearing as tolerated  Will switch discharge plan to HHPT due to slower progression. Possible discharge this afternoon if doing well.  Theresa Duty, PA-C Orthopedic Surgery [phone] 10/04/2021, 7:23 AM

## 2021-10-04 NOTE — Progress Notes (Signed)
Physical Therapy Treatment Patient Details Name: Ariel Lambert MRN: [id number] DOB: [birth year] Today's Date: 10/04/2021   History of Present Illness 86 yo female s/p L TKA 10/01/21. Hx of CVA, osteoporosis, asthma    PT Comments    POD # 4 Pt is NOT thriving.  Continues to present with mobility decline.  Now requiring Total Assist + 2 for all transfers and mobility. General Comments: AxO x 1 requiring repeat functional commands.  She knew she was in the hospital but did not recall why.  Increased c/o weakness/fatigue.  Pt appears dehydrated and suken.  Not sure how much she is eating/drinking past 4 days given her decreased level of cognition. Discussed with daughter and RN rec for Nutrition/Dietary Consult as well as IV fluids.  General bed mobility comments: NT reported pt was assisted OOB to recliner about eight this morning and that pt was in "a lot pain" and "it took two of [place]". General transfer comment: pt required + 2 Total Assist this session to transfer out of recliner to [facility].  Pt c/o MAX weakness/fatigue.  Severe posterior lean and inability to self support upright.  Also great difficulty weight shifting and/or advancing eith LE to complete 1/4 turn.  Therapist guided hips and pivoted pt completely.  Pain was present but tolerable.General Gait Details: Signitifant decrease in her ability to mobilize.  Pt amb 35 feet on POD # 1.  Today she struggled to get to 2 feet.  MAX c/o weakness.  Inability to support herself.  Pain was present but tolerable.  Pre medicated with 5 mg OxyCODONE > 30 min prior to session.  Daughter plans to take pt home and will have more help on Sunday and there after when another daughter comes to town.  Pt has NOT met mobility goals to safely D/C today.  Pt may need SNF if she continues to require extensive help.  Will update LPT and continue to follow.    Recommendations for follow up therapy are one component of a multi-disciplinary discharge planning  process, led by the attending physician.  Recommendations may be updated based on patient status, additional functional criteria and insurance authorization.  Follow Up Recommendations  Skilled nursing-short term rehab (<3 hours/day)     Assistance Recommended at Discharge    Patient can return home with the following Two people to help with walking and/or transfers;Two people to help with bathing/dressing/bathroom;Assist for transportation;Assistance with cooking/housework;Help with stairs or ramp for entrance;Direct supervision/assist for financial management;Assistance with feeding   Equipment Recommendations  Rolling walker (2 wheels)    Recommendations for Other Services       Precautions / Restrictions Precautions Precautions: Fall;Knee Precaution Comments: incontinent Restrictions Weight Bearing Restrictions: No LLE Weight Bearing: Weight bearing as tolerated     Mobility  Bed Mobility               General bed mobility comments: NT reported pt was assisted OOB to recliner about eight this morning and that pt was in "a lot pain" and "it took two of [place]".    Transfers Overall transfer level: Needs assistance Equipment used: Rolling walker (2 wheels) Transfers: Sit to/from Stand, Bed to chair/wheelchair/BSC Sit to Stand: Total assist, +2 physical assistance, +2 safety/equipment           General transfer comment: pt required + 2 Total Assist this session to transfer out of recliner to [facility].  Pt c/o MAX weakness/fatigue.  Severe posterior lean and inability to self support upright.  Also  great difficulty weight shifting and/or advancing eith LE to complete 1/4 turn.  Therapist guided hips and pivoted pt completely.  Pain was present but tolerable.    Ambulation/Gait Ambulation/Gait assistance: Total assist, +2 physical assistance, +2 safety/equipment Gait Distance (Feet): 2 Feet Assistive device: Rolling walker (2 wheels) Gait Pattern/deviations: Step-to  pattern, Trunk flexed, Antalgic Gait velocity: decreased     General Gait Details: Signitifant decrease in her ability to mobilize.  Pt amb 35 feet on POD # 1.  Today she struggled to get to 2 feet.  MAX c/o weakness.  Inability to support herself.  Pain was present but tolerable.  Pre medicated with 5 mg OxyCODONE > 30 min prior to session.   Stairs             Wheelchair Mobility    Modified Rankin (Stroke Patients Only)       Balance                                            Cognition Arousal/Alertness: Awake/alert Behavior During Therapy: Flat affect Overall Cognitive Status: Impaired/Different from baseline Area of Impairment: Orientation, Attention, Memory, Following commands, Safety/judgement, Awareness, Problem solving                 Orientation Level: Person     Following Commands: Follows one step commands inconsistently Safety/Judgement: Decreased awareness of safety, Decreased awareness of deficits     General Comments: AxO x 1 requiring repeat functional commands.  She knew she was in the hospital but did not recall why.  Increased c/o weakness/fatigue.  Pt appears dehydrated and suken.  Not sure how much she is eating/drinking past 4 days given her decreased level of cognition.        Exercises      General Comments        Pertinent Vitals/Pain Pain Assessment Pain Assessment: Faces Faces Pain Scale: Hurts little more Pain Location: L knee Pain Descriptors / Indicators: Discomfort, Sore, Grimacing, Operative site guarding Pain Intervention(s): Monitored during session, Premedicated before session, Repositioned, Ice applied    Home Living                          Prior Function            PT Goals (current goals can now be found in the care plan section) Progress towards PT goals: Progressing toward goals    Frequency    7X/week      PT Plan Current plan remains appropriate     Co-evaluation              AM-PAC PT "6 Clicks" Mobility   Outcome Measure  Help needed turning from your back to your side while in a flat bed without using bedrails?: Total Help needed moving from lying on your back to sitting on the side of a flat bed without using bedrails?: Total Help needed moving to and from a bed to a chair (including a wheelchair)?: Total Help needed standing up from a chair using your arms (e.g., wheelchair or bedside chair)?: Total Help needed to walk in hospital room?: Total Help needed climbing 3-5 steps with a railing? : Total 6 Click Score: 6    End of Session Equipment Utilized During Treatment: Gait belt Activity Tolerance: Patient limited by fatigue Patient left: in chair;with call bell/phone  within reach;with family/visitor present Nurse Communication: Mobility status PT Visit Diagnosis: Muscle weakness (generalized) (M62.81);Difficulty in walking, not elsewhere classified (R26.2)     Time: 8727-6184 PT Time Calculation (min) (ACUTE ONLY): 50 min  Charges:  $Gait Training: 8-22 mins $Therapeutic Activity: 23-37 mins                     Rica Koyanagi  PTA Acute  Rehabilitation Services Pager      [phone] Office      [phone]

## 2021-10-05 NOTE — Plan of Care (Signed)
°  Problem: Pain Management: Goal: Pain level will decrease with appropriate interventions Outcome: Progressing   Problem: Elimination: Goal: Will not experience complications related to urinary retention Outcome: Progressing   Problem: Pain Managment: Goal: General experience of comfort will improve Outcome: Progressing   Problem: Safety: Goal: Ability to remain free from injury will improve Outcome: Progressing

## 2021-10-05 NOTE — Progress Notes (Signed)
Physical Therapy Treatment Patient Details Name: Ariel Lambert MRN: [id number] DOB: 1936-04-21 Today's Date: 10/05/2021   History of Present Illness 86 yo female s/p L TKA 10/01/21. Hx of CVA, osteoporosis, asthma    PT Comments    POD # 5 pm session General Comments: improving with every session.  Pain is being better controlled and Daughter has been staying most of the day to hydrate and assist pt with eating. Plan is to D/C to home Monday when another daughter from [place] can come and also help.  Hopefully pt's mobility will continue to improve such that she does not require + 2 assist.   Assisted out of recliner to [facility].  Caution.Marland KitchenMarland KitchenPt is incont urine upon standing each time.  Wears pulls ups at home.  Required +2 assist.  General transfer comment: pt with slight improvement but still requiring + 2 assist.  Assisted from elevated bed to [facility] with Daughter Ariel Lambert "hands on" under instruction of Therapist on proper handling, use of gait belt and turn completion.  Pt still required 75% VC's on proper hand placement and transition.  Very unsteady with poor forward flex posture and difficulty WBing thru L LE.  Pt also incont urine upond standing.  Pt required Total Assist for hygiene + 2 assist due to poor balance and inability to stand upright.  HIGH FALL RISK.General Gait Details: with + 2 assist and much effort to support pt and guide walker, pt was able to amb 18 feet but not functionally.  Difficulty weight shifting and inability to stand upright.  Recliner following closely behind.  Had Daughter "Ariel Lambert" hands on assist as she will be caregiver at home. Was able to perform a few TKR TE's AAROM limited by pain knee flex approx 40 degrees.    Recommendations for follow up therapy are one component of a multi-disciplinary discharge planning process, led by the attending physician.  Recommendations may be updated based on patient status, additional functional criteria and insurance  authorization.  Follow Up Recommendations  Home health PT     Assistance Recommended at Discharge Frequent or constant Supervision/Assistance  Patient can return home with the following Two people to help with walking and/or transfers;Two people to help with bathing/dressing/bathroom;Assist for transportation;Assistance with cooking/housework;Help with stairs or ramp for entrance;Direct supervision/assist for financial management;Assistance with feeding   Equipment Recommendations  Rolling walker (2 wheels)    Recommendations for Other Services       Precautions / Restrictions Precautions Precautions: Fall;Knee Precaution Comments: incontinent Restrictions Weight Bearing Restrictions: No LLE Weight Bearing: Weight bearing as tolerated     Mobility  Bed Mobility Overal bed mobility: Needs Assistance Bed Mobility: Supine to Sit     Supine to sit: Total assist     General bed mobility comments: OOB in recliner    Transfers Overall transfer level: Needs assistance Equipment used: Rolling walker (2 wheels) Transfers: Sit to/from Stand, Bed to chair/wheelchair/BSC Sit to Stand: Max assist, +2 physical assistance, +2 safety/equipment           General transfer comment: pt with slight improvement but still requiring + 2 assist.  Assisted from elevated bed to [facility] with Daughter Ariel Lambert "hands on" under instruction of Therapist on proper handling, use of gait belt and turn completion.  Pt still required 75% VC's on proper hand placement and transition.  Very unsteady with poor forward flex posture and difficulty WBing thru L LE.  Pt also incont urine upond standing.  Pt required Total Assist for hygiene +  2 assist due to poor balance and inability to stand upright.  HIGH FALL RISK.    Ambulation/Gait Ambulation/Gait assistance: Max assist, +2 physical assistance, +2 safety/equipment, Mod assist Gait Distance (Feet): 18 Feet Assistive device: Rolling walker (2 wheels)   Gait  velocity: decreased     General Gait Details: with + 2 assist and much effort to support pt and guide walker, pt was able to amb 18 feet but not functionally.  Difficulty weight shifting and inability to stand upright.  Recliner following closely behind.  Had Daughter "Ariel Lambert" hands on assist as she will be caregiver at home.   Stairs             Wheelchair Mobility    Modified Rankin (Stroke Patients Only)       Balance                                            Cognition Arousal/Alertness: Awake/alert Behavior During Therapy: WFL for tasks assessed/performed Overall Cognitive Status: Impaired/Different from baseline                                 General Comments: improving with every session.  Pain is being better controlled and Daughter has been staying most of the day to hydrate and assist pt with eating.        Exercises      General Comments        Pertinent Vitals/Pain Pain Assessment Pain Assessment: 0-10 Faces Pain Scale: Hurts little more Pain Location: L knee Pain Descriptors / Indicators: Discomfort, Sore, Grimacing, Operative site guarding Pain Intervention(s): Monitored during session, Premedicated before session, Repositioned, Ice applied    Home Living                          Prior Function            PT Goals (current goals can now be found in the care plan section) Progress towards PT goals: Progressing toward goals    Frequency    7X/week      PT Plan Current plan remains appropriate    Co-evaluation              AM-PAC PT "6 Clicks" Mobility   Outcome Measure  Help needed turning from your back to your side while in a flat bed without using bedrails?: A Lot Help needed moving from lying on your back to sitting on the side of a flat bed without using bedrails?: A Lot Help needed moving to and from a bed to a chair (including a wheelchair)?: A Lot Help needed standing up  from a chair using your arms (e.g., wheelchair or bedside chair)?: Total Help needed to walk in hospital room?: Total Help needed climbing 3-5 steps with a railing? : Total 6 Click Score: 9    End of Session Equipment Utilized During Treatment: Gait belt Activity Tolerance: Patient limited by fatigue Patient left: in chair;with chair alarm set;with call bell/phone within reach;with family/visitor present Nurse Communication: Mobility status PT Visit Diagnosis: Muscle weakness (generalized) (M62.81);Difficulty in walking, not elsewhere classified (R26.2)     Time: 1308-6578 PT Time Calculation (min) (ACUTE ONLY): 29 min  Charges:  $Gait Training: 8-22 mins $Therapeutic Activity: 8-22 mins                     {  Rica Koyanagi  PTA Acute  Rehabilitation Services Pager      [phone] Office      [phone]

## 2021-10-05 NOTE — TOC Progression Note (Signed)
Transition of Care [facility]) - Progression Note    Patient Details  Name: Ariel Lambert MRN: [id number] Date of Birth: 01-06-1936  Transition of Care [facility]) CM/SW Contact  Ross Ludwig, [place] Phone Number: 10/05/2021, 2:37 PM  Clinical Narrative:     CSW attempted to contact patient's daughter [name] to confirm the plan is for patient to return back home with home health verse going to a SNF.  CSW left a message on voice mail awaiting for a call back.   Expected Discharge Plan: [place] Barriers to Discharge: No Barriers Identified  Expected Discharge Plan and Services Expected Discharge Plan: [place] Choice: Durable Medical Equipment, Home Health   Expected Discharge Date: 10/04/21               DME Arranged: 3-N-1 DME Agency: [employer] Date DME Agency Contacted: 10/04/21 Time DME Agency Contacted: 1206 Representative spoke with at DME Agency: [place]: PT [place]: [facility] [place] Determinants of Health (SDOH) Interventions    Readmission Risk Interventions No flowsheet data found.

## 2021-10-05 NOTE — Plan of Care (Signed)
  Problem: Education: Goal: Knowledge of the prescribed therapeutic regimen will improve Outcome: Progressing   Problem: Activity: Goal: Ability to avoid complications of mobility impairment will improve Outcome: Progressing   Problem: Pain Management: Goal: Pain level will decrease with appropriate interventions Outcome: Progressing   

## 2021-10-05 NOTE — Progress Notes (Signed)
Physical Therapy Treatment Patient Details Name: Ariel Lambert MRN: [id number] DOB: 02/12/1936 Today's Date: 10/05/2021   History of Present Illness 86 yo female s/p L TKA 10/01/21. Hx of CVA, osteoporosis, asthma    PT Comments    POD # 5 Pt looks "better" ans is more alert.  General Comments: improved from yesterday, more alert, more Sprite.  Able to recall what she ate for breakfast.  Admits she does not remember last couple of days.  Following all commands and able to feed herslf after set. Daughter [name] present during session and has been feeding her mom [employer].  Pt still required + 2 assist for mobility. General bed mobility comments: still requires much assist to transition to EOB with assist for upper body, scooting and supporting L LE. General transfer comment: pt with slight improvement but still requiring + 2 assist.  Assisted from elevated bed to [facility] with Daughter [name] "hands on" under instruction of Therapist on proper handling, use of gait belt and turn completion.  Pt still required 75% VC's on proper hand placement and transition.  Very unsteady with poor forward flex posture and difficulty WBing thru L LE.  Pt also incont urine upond standing.  Pt required Total Assist for hygiene + 2 assist due to poor balance and inability to stand upright.  HIGH FALL RISK.General Gait Details: with + 2 assist and much effort to support pt and guide walker, pt was able to amb 12 feet but not functionally.  Difficulty weight shifting and inability to stand upright.  Recliner following closely behind.  Had Daughter "[name]" hands on assist as she will be caregiver at home. [name] stated her sister will be here to also help but not until "late Sunday night".  Will see pt again this afternoon.  Recommendations for follow up therapy are one component of a multi-disciplinary discharge planning process, led by the attending physician.  Recommendations may be updated based on patient status,  additional functional criteria and insurance authorization.  Follow Up Recommendations  Skilled nursing-short term rehab (<3 hours/day) (family plans to take her home)     Assistance Recommended at Discharge Frequent or constant Supervision/Assistance  Patient can return home with the following Two people to help with walking and/or transfers;Two people to help with bathing/dressing/bathroom;Assist for transportation;Assistance with cooking/housework;Help with stairs or ramp for entrance;Direct supervision/assist for financial management;Assistance with feeding   Equipment Recommendations  Rolling walker (2 wheels)    Recommendations for Other Services       Precautions / Restrictions Precautions Precautions: Fall;Knee Precaution Comments: incontinent Restrictions Weight Bearing Restrictions: No LLE Weight Bearing: Weight bearing as tolerated     Mobility  Bed Mobility Overal bed mobility: Needs Assistance Bed Mobility: Supine to Sit     Supine to sit: Total assist     General bed mobility comments: still requires much assist to transition to EOB with assist for upper body, scooting and supporting L LE.    Transfers Overall transfer level: Needs assistance Equipment used: Rolling walker (2 wheels) Transfers: Sit to/from Stand, Bed to chair/wheelchair/BSC Sit to Stand: Max assist, +2 physical assistance, +2 safety/equipment           General transfer comment: pt with slight improvement but still requiring + 2 assist.  Assisted from elevated bed to [facility] with Daughter [name] "hands on" under instruction of Therapist on proper handling, use of gait belt and turn completion.  Pt still required 75% VC's on proper hand placement and transition.  Very  unsteady with poor forward flex posture and difficulty WBing thru L LE.  Pt also incont urine upond standing.  Pt required Total Assist for hygiene + 2 assist due to poor balance and inability to stand upright.  HIGH FALL RISK.     Ambulation/Gait Ambulation/Gait assistance: Max assist, +2 physical assistance, +2 safety/equipment Gait Distance (Feet): 12 Feet Assistive device: Rolling walker (2 wheels)         General Gait Details: with + 2 assist and much effort to support pt and guide walker, pt was able to amb 12 feet but not functionally.  Difficulty weight shifting and inability to stand upright.  Recliner following closely behind.  Had Daughter "Misty StanleyLisa" hands on assist as she will be caregiver at home.   Stairs             Wheelchair Mobility    Modified Rankin (Stroke Patients Only)       Balance                                            Cognition Arousal/Alertness: Awake/alert Behavior During Therapy: WFL for tasks assessed/performed Overall Cognitive Status: Impaired/Different from baseline                                 General Comments: improved from yesterday, more alert, more Sprite.  Able to recall what she ate for breakfast.  Admits she does not remember last couple of days.  Following all commands and able to feed herslf after set.        Exercises      General Comments        Pertinent Vitals/Pain Pain Assessment Pain Assessment: Faces Faces Pain Scale: Hurts little more Pain Location: L knee Pain Descriptors / Indicators: Discomfort, Sore, Grimacing, Operative site guarding Pain Intervention(s): Monitored during session, Premedicated before session, Repositioned, Ice applied    Home Living                          Prior Function            PT Goals (current goals can now be found in the care plan section) Progress towards PT goals: Progressing toward goals    Frequency    7X/week      PT Plan Current plan remains appropriate    Co-evaluation              AM-PAC PT "6 Clicks" Mobility   Outcome Measure  Help needed turning from your back to your side while in a flat bed without using bedrails?: A  Lot Help needed moving from lying on your back to sitting on the side of a flat bed without using bedrails?: A Lot Help needed moving to and from a bed to a chair (including a wheelchair)?: A Lot Help needed standing up from a chair using your arms (e.g., wheelchair or bedside chair)?: A Lot Help needed to walk in hospital room?: Total Help needed climbing 3-5 steps with a railing? : Total 6 Click Score: 10    End of Session Equipment Utilized During Treatment: Gait belt Activity Tolerance: Patient limited by fatigue Patient left: in chair;with chair alarm set;with call bell/phone within reach;with family/visitor present Nurse Communication: Mobility status PT Visit Diagnosis: Muscle weakness (generalized) (M62.81);Difficulty in  walking, not elsewhere classified (R26.2)     Time: 3785-8850 PT Time Calculation (min) (ACUTE ONLY): 26 min  Charges:  $Gait Training: 8-22 mins $Therapeutic Activity: 8-22 mins                     Felecia Shelling  PTA Acute  Rehabilitation Services Pager      [phone] Office      [phone]

## 2021-10-05 NOTE — Progress Notes (Signed)
Ariel Lambert  MRN: [id number] DOB/Age: 84-Nov-1937 84 y.o. Physician: Lynnea Maizes, M.D. 5 Days Post-Op Procedure(s) (LRB): TOTAL KNEE ARTHROPLASTY (Left)  Subjective: Patient reporting significantly increased left knee pain this morning.  Staff notes poor p.o. intake.  Limited participation in therapy yesterday secondary to work-up above complaints of chest pain.  EKG by report within normal limits.  Physical therapy reports patient complains of increased weakness and fatigue. Vital Signs Temp:  [98.4 F (36.9 C)-99.1 F (37.3 C)] 98.4 F (36.9 C) (01/21 0501) Pulse Rate:  [70-88] 77 (01/21 0504) Resp:  [17-20] 20 (01/21 0501) BP: (94-141)/(61-86) 123/81 (01/21 0501) SpO2:  [92 %-97 %] 96 % (01/21 0759)  Lab Results Recent Labs    10/03/21 0300  WBC 8.4  HGB 10.5*  HCT 32.4*  PLT 186   BMET No results for input(s): NA, K, CL, CO2, GLUCOSE, BUN, CREATININE, CALCIUM in the last 72 hours. INR  Date Value Ref Range Status  09/18/2021 1.0 0.8 - 1.2 Final    Comment:    (NOTE) INR goal varies based on device and disease states. Performed at [facility] [place], [address] [place], [place] [address]      Exam  Patient resting in bed this morning complaining of left knee pain.  Aquacel dressing is intact.  No obvious blood on the dressing.  Compartments are soft.  Grossly neurovascular intact distally but poor strength and mobility secondary to pain and general fatigue and frailty.  Impression:  Status post left total knee arthroplasty with very slow clinical progress.  Poor p.o. intake.  No recent bowel movements.  Plan Discussed with patient and staff ongoing management plan.  We will continue with stool softeners and laxatives and utilize enema today.  Continue IV fluid due to poor p.o. intake.  Continue to work with OT/PT.  Ariel Lambert 10/05/2021, 9:52 AM   Contact # [phone]

## 2021-10-06 ENCOUNTER — Inpatient Hospital Stay (HOSPITAL_COMMUNITY): Payer: Medicare PPO

## 2021-10-06 DIAGNOSIS — G9341 Metabolic encephalopathy: Secondary | ICD-10-CM | POA: Diagnosis not present

## 2021-10-06 LAB — CBC
HCT: 25.4 % — ABNORMAL LOW (ref 36.0–46.0)
Hemoglobin: 8.2 g/dL — ABNORMAL LOW (ref 12.0–15.0)
MCH: 32.3 pg (ref 26.0–34.0)
MCHC: 32.3 g/dL (ref 30.0–36.0)
MCV: 100 fL (ref 80.0–100.0)
Platelets: 237 10*3/uL (ref 150–400)
RBC: 2.54 MIL/uL — ABNORMAL LOW (ref 3.87–5.11)
RDW: 13.9 % (ref 11.5–15.5)
WBC: 8.9 10*3/uL (ref 4.0–10.5)
nRBC: 0 % (ref 0.0–0.2)

## 2021-10-06 LAB — URINALYSIS, ROUTINE W REFLEX MICROSCOPIC
Bilirubin Urine: NEGATIVE
Glucose, UA: NEGATIVE mg/dL
Ketones, ur: NEGATIVE mg/dL
Nitrite: POSITIVE — AB
Protein, ur: NEGATIVE mg/dL
Specific Gravity, Urine: 1.01 (ref 1.005–1.030)
WBC, UA: >50 WBC/hpf — ABNORMAL HIGH (ref 0–5)
pH: 6 (ref 5.0–8.0)

## 2021-10-06 LAB — COMPREHENSIVE METABOLIC PANEL
ALT: 37 U/L (ref 0–44)
AST: 32 U/L (ref 15–41)
Albumin: 2.4 g/dL — ABNORMAL LOW (ref 3.5–5.0)
Alkaline Phosphatase: 46 U/L (ref 38–126)
Anion gap: 7 (ref 5–15)
BUN: 13 mg/dL (ref 8–23)
CO2: 27 mmol/L (ref 22–32)
Calcium: 8 mg/dL — ABNORMAL LOW (ref 8.9–10.3)
Chloride: 95 mmol/L — ABNORMAL LOW (ref 98–111)
Creatinine, Ser: 0.49 mg/dL (ref 0.44–1.00)
GFR, Estimated: >60 mL/min (ref >60)
Glucose, Bld: 95 mg/dL (ref 70–99)
Potassium: 3.6 mmol/L (ref 3.5–5.1)
Sodium: 129 mmol/L — ABNORMAL LOW (ref 135–145)
Total Bilirubin: 1.2 mg/dL (ref 0.3–1.2)
Total Protein: 5.6 g/dL — ABNORMAL LOW (ref 6.5–8.1)

## 2021-10-06 MED ORDER — ENSURE SURGERY PO LIQD
237.0000 mL | Freq: Two times a day (BID) | ORAL | Status: DC
  Administered 2021-10-06 – 2021-10-09 (×6): 237 mL via ORAL

## 2021-10-06 MED ORDER — ADULT MULTIVITAMIN W/MINERALS CH
1.0000 | ORAL_TABLET | Freq: Every day | ORAL | Status: DC
  Administered 2021-10-07 – 2021-10-09 (×3): 1 via ORAL
  Filled 2021-10-06 (×4): qty 1

## 2021-10-06 MED ORDER — MELATONIN 3 MG PO TABS
3.0000 mg | ORAL_TABLET | Freq: Every day | ORAL | Status: DC
  Administered 2021-10-06 – 2021-10-08 (×3): 3 mg via ORAL
  Filled 2021-10-06 (×3): qty 1

## 2021-10-06 MED ORDER — SODIUM CHLORIDE 0.9 % IV SOLN: INTRAVENOUS | Status: DC

## 2021-10-06 MED ORDER — SODIUM CHLORIDE 0.9 % IV SOLN
1.0000 g | INTRAVENOUS | Status: DC
  Administered 2021-10-06 – 2021-10-09 (×4): 1 g via INTRAVENOUS
  Filled 2021-10-06 (×4): qty 10

## 2021-10-06 NOTE — Consult Note (Signed)
Medical Consultation  Ariel Lambert QIH:[id number] DOB: 07/30/1936 DOA: 09/30/2021 PCP: Mila Palmer, MD   Requesting physician: Dr. Lequita Halt Date of consultation: 10/06/21 Reason for consultation: AMS  Impression/Recommendations Altered Mental Status UTI     - she's A&O x 3 (person, place, president) this afternoon; she's aware that the Bills will be playing today (seems fixated on [employer])     - UA is positive; this becomes the most likely culprit; UCx ordered, rocephin started     - it is likely opioids and sundowning are also contributing; limit heavy opioid use, regulate sleep cycle     - she does have mild hyponatremia; see below     - non-focal exam, but CTH ordered for completeness  Hyponatremia     - mild     - she has HCTZ on her med orders; hold it and add IVF  Left knee pain s/p L TKA     - per primary team  Remainder per primary team.  [facility] will follow-up again tomorrow. Please contact me if I can be of assistance in the meanwhile. Thank you for this consultation.  Chief Complaint: left knee pain  HPI:  Ariel Lambert is a 86 y.o. female with medical history significant of COPD, previous CVA, HTN, hyperthyroidism, HLD. Presenting with knee pain. She has been following with orthopedics for several months on this matter. It has not responded to conservative treatment. The decision was made to perform a L TKA. That procedure was successfully completed on 09/30/20. In the days since, the patient has had several episodes of confusion and lethargy. They have seemed to worsen with increased pain med use. She also seems to have poor PO intake during this time. This morning she seemed very lethargic. The orthopedic team consulted TRH for altered mental status.  Review of Systems:  Denies CP, palpitations, dyspnea, N/V/D, fevers. Remainder of ROS is negative for all not mentioned in HPI.  Past Medical History:  Diagnosis Date   Allergy    Arthritis     ASTHMA 05/03/2009   Asthma    Carotid stenosis    Chronic kidney disease    " in remission"   COPD (chronic obstructive pulmonary disease) (HCC)    CVA (cerebral infarction)    Dyslipidemia    Headache    Hearing aid worn    B/L   HYPERTENSION 04/30/2007   HYPERTHYROIDISM 06/17/2007   Hyperthyroidism    Menopause    Multinodular goiter    OSTEOPOROSIS 04/30/2007   Stroke (HCC)    Wears glasses    Past Surgical History:  Procedure Laterality Date   BACK SURGERY     BREAST EXCISIONAL BIOPSY Left    carotid artery surgery     CATARACT EXTRACTION W/ INTRAOCULAR LENS  IMPLANT, BILATERAL     ENDARTERECTOMY Right 09/29/2019   Procedure: REDO OF RIGHT ENDARTERECTOMY CAROTID;  Surgeon: Maeola Harman, MD;  Location: [facility] OR;  Service: Vascular;  Laterality: Right;   NECK SURGERY     OTHER SURGICAL HISTORY     discectomy   TOTAL KNEE ARTHROPLASTY Left 09/30/2021   Procedure: TOTAL KNEE ARTHROPLASTY;  Surgeon: Ollen Gross, MD;  Location: [facility] ORS;  Service: Orthopedics;  Laterality: Left;   WISDOM TOOTH EXTRACTION     Social History:  reports that she has quit smoking. Her smoking use included cigarettes. She has never used smokeless tobacco. She reports current alcohol use. She reports that she does not use drugs.  Allergies  Allergen  Reactions   Cetirizine Other (See Comments)    Stomach pain   Cetirizine & Related Other (See Comments)    Stomach pains   Codeine Itching    Severe itching   Meperidine Diarrhea and Nausea And Vomiting   Meperidine Hcl Diarrhea and Nausea And Vomiting    (Demerol)   Nabumetone Rash    (Relafen)   Naproxen Itching and Rash   Sulfa Antibiotics Rash   Sulfonamide Derivatives Rash   Family History  Problem Relation Age of Onset   Heart disease Other    Breast cancer Mother [id number]    Prior to Admission medications   Medication Sig Start Date End Date Taking? Authorizing Provider  alendronate (FOSAMAX) 70 MG tablet Take 70 mg by mouth  every Friday. Take with a full glass of water on an empty stomach.   Yes [provider]  aspirin EC 81 MG EC tablet Take 1 tablet (81 mg total) by mouth daily at 6 (six) AM. 10/01/19  Yes Emilie RutterEveland, Matthew, PA-C  azelastine (ASTELIN) 0.1 % nasal spray Place 1 spray into both nostrils at bedtime. Use in each nostril as directed   Yes [provider]  b complex vitamins tablet Take 1 tablet by mouth daily.   Yes [provider]  Biotin 5000 MCG CAPS Take 5,000 mcg by mouth daily.   Yes [provider]  Calcium Carbonate 500 (200 Ca) MG WAFR 500 mg 2 (two) times daily with a meal.   Yes [provider]  desloratadine (CLARINEX) 5 MG tablet Take 5 mg by mouth at bedtime. 08/21/21  Yes [provider]  diphenhydrAMINE (BENADRYL) 25 MG tablet 25 mg daily as needed for allergies.   Yes [provider]  diphenhydrAMINE (SOMINEX) 25 MG tablet Take 25 mg by mouth at bedtime as needed for sleep.   Yes [provider]  ezetimibe-simvastatin (VYTORIN) 10-20 MG per tablet Take 1 tablet by mouth daily after breakfast.   Yes [provider]  fexofenadine (ALLEGRA) 180 MG tablet Take 180 mg by mouth daily.   Yes [provider]  ipratropium (ATROVENT) 0.03 % nasal spray Place 2 sprays into both nostrils daily.   Yes [provider]  levalbuterol (XOPENEX) 0.63 MG/3ML nebulizer solution Take 3 mLs (0.63 mg total) by nebulization every 4 (four) hours. Patient taking differently: Take 0.63 mg by nebulization every 6 (six) hours as needed for wheezing or shortness of breath. 09/07/15  Yes Rhetta MuraSamtani, Jai-Gurmukh, MD  magnesium gluconate (MAGONATE) 500 MG tablet Take 500 mg by mouth daily.   Yes [provider]  methimazole (TAPAZOLE) 10 MG tablet TAKE 1 TABLET BY MOUTH EVERY DAY Patient taking differently: Take 5-10 mg by mouth See admin instructions. Take 10 mg one day 5 mg the next day alternate every other day  07/25/21  Yes Romero BellingEllison, Sean, MD  montelukast (SINGULAIR) 10 MG tablet Take 10 mg by mouth at bedtime.   Yes [provider]  Multiple Vitamin (MULTIVITAMIN WITH MINERALS) TABS tablet Take 1 tablet by mouth daily.   Yes [provider]  SYMBICORT 160-4.5 MCG/ACT inhaler Inhale 2 puffs into the lungs 2 (two) times daily. 07/09/15  Yes [provider]  valsartan-hydrochlorothiazide (DIOVAN-HCT) 320-12.5 MG tablet Take 1 tablet by mouth daily. 07/20/21  Yes [provider]  Vitamin D3 (VITAMIN D) 25 MCG tablet Take 1,000 Units by mouth daily.   Yes [provider]  albuterol (PROVENTIL HFA;VENTOLIN HFA) 108 (90 BASE) MCG/ACT inhaler Inhale 2 puffs  into the lungs every 6 (six) hours as needed. Patient taking differently: Inhale 2 puffs into the lungs every 6 (six) hours as needed for wheezing or shortness of breath (Asthma). 04/20/14   Leslye Peer, MD  methocarbamol (ROBAXIN) 500 MG tablet Take 1 tablet (500 mg total) by mouth every 6 (six) hours as needed for muscle spasms. 10/02/21   Edmisten, Kristie L, PA  oxyCODONE (OXY IR/ROXICODONE) 5 MG immediate release tablet Take 1 tablet (5 mg total) by mouth every 6 (six) hours as needed for severe pain. 10/04/21   Edmisten, Lyn Hollingshead, PA  rivaroxaban (XARELTO) 10 MG TABS tablet Take 1 tablet (10 mg total) by mouth daily with breakfast for 19 days. Then resume one 81 mg aspirin once a day. 10/03/21 10/22/21  Edmisten, Lyn Hollingshead, PA  traMADol (ULTRAM) 50 MG tablet Take 1-2 tablets (50-100 mg total) by mouth every 6 (six) hours as needed for moderate pain. 10/02/21   Derenda Fennel, PA   Physical Exam: Blood pressure 111/67, pulse 80, temperature 98.7 F (37.1 C), temperature source Oral, resp. rate 16, height 5\' 3"  (1.6 m), weight 63.1 kg, SpO2 92 %. Vitals:   10/06/21 0410 10/06/21 0800  BP: 111/67   Pulse: 80   Resp: 16   Temp: 98.7 F (37.1 C)   SpO2: 90% 92%    General: 86 y.o. female resting in bed in  NAD Eyes: PERRL, normal sclera ENMT: Nares patent w/o discharge, orophaynx clear, dentition normal, ears w/o discharge/lesions/ulcers Neck: Supple, trachea midline Cardiovascular: RRR, +S1, S2, no m/g/r, equal pulses throughout Respiratory: CTABL, no w/r/r, normal WOB GI: BS+, NDNT, no masses noted, no organomegaly noted MSK: No c/c; left knee swelling Neuro: A&O x name/place/president, no focal deficits Psyc: a little drowsy, calm/cooperative  Labs on Admission:  Basic Metabolic Panel: Recent Labs  Lab 10/01/21 0306 10/02/21 0311 10/06/21 0842  NA 137 137 129*  K 4.4 4.4 3.6  CL 105 104 95*  CO2 26 29 27   GLUCOSE 162* 100* 95  BUN 14 13 13   CREATININE 0.62 0.61 0.49  CALCIUM 8.9 8.8* 8.0*   Liver Function Tests: Recent Labs  Lab 10/06/21 0842  AST 32  ALT 37  ALKPHOS 46  BILITOT 1.2  PROT 5.6*  ALBUMIN 2.4*   No results for input(s): LIPASE, AMYLASE in the last 168 hours. No results for input(s): AMMONIA in the last 168 hours. CBC: Recent Labs  Lab 10/01/21 0306 10/02/21 0311 10/03/21 0300 10/06/21 0842  WBC 7.7 9.1 8.4 8.9  HGB 11.4* 10.0* 10.5* 8.2*  HCT 35.5* 31.4* 32.4* 25.4*  MCV 103.2* 103.0* 102.5* 100.0  PLT 192 178 186 237   Cardiac Enzymes: No results for input(s): CKTOTAL, CKMB, CKMBINDEX, TROPONINI in the last 168 hours. BNP: Invalid input(s): POCBNP CBG: No results for input(s): GLUCAP in the last 168 hours.  Radiological Exams on Admission: No results found.  Time spent: 45 minutes  Kelvin Burpee A Sherline Eberwein DO [facility]  If 7PM-7AM, please contact night-coverage [email or web address] 10/06/2021, 1:22 PM

## 2021-10-06 NOTE — Progress Notes (Signed)
Physical Therapy Treatment Patient Details Name: Ariel Lambert MRN: [id number] DOB: 11/13/1935 Today's Date: 10/06/2021   History of Present Illness 86 yo female s/p L TKA 10/01/21. Hx of CVA, osteoporosis, asthma    PT Comments    Pt declining with cognition and functional status today. She is unable to follow commands and requiring max to total assist of 1 to 2 staff members for bed mobility and transfers. She is oriented only to place and cannot tell PT why she is here even when cued to observe her L knee bandage.   She is unable to attend to basic tasks, scratching constantly and appears internally distracted.  Pt requires step by step and hand over hand cues and even then is minimally participatory.  RN made aware, PA drawing additional labs with pt noted to be hyponatremic. RN today is holding narcotics as this may be further contributing to pt's marked change in status. Pt's dtrs will not be able to manage her at home unless her cognition clears significantly.   Recommendations for follow up therapy are one component of a multi-disciplinary discharge planning process, led by the attending physician.  Recommendations may be updated based on patient status, additional functional criteria and insurance authorization.  Follow Up Recommendations  Other (comment) (SNF vs HHPT)     Assistance Recommended at Discharge Frequent or constant Supervision/Assistance  Patient can return home with the following Two people to help with walking and/or transfers;Two people to help with bathing/dressing/bathroom;Assist for transportation;Assistance with cooking/housework;Help with stairs or ramp for entrance;Direct supervision/assist for financial management;Assistance with feeding   Equipment Recommendations  Rolling walker (2 wheels)    Recommendations for Other Services       Precautions / Restrictions Precautions Precautions: Fall;Knee Precaution Comments:  incontinent Restrictions Weight Bearing Restrictions: No LLE Weight Bearing: Weight bearing as tolerated     Mobility  Bed Mobility   Bed Mobility: Supine to Sit     Supine to sit: Max assist, Total assist     General bed mobility comments: step by step cues to self assist lateral scooting in supine, assist to bring LLE off bed and elevate trunk. excessive time needed    Transfers Overall transfer level: Needs assistance Equipment used: Rolling walker (2 wheels) Transfers: Sit to/from Stand, Bed to chair/wheelchair/BSC Sit to Stand: Total assist, Max assist, +2 physical assistance, +2 safety/equipment   Step pivot transfers: Max assist, Total assist       General transfer comment: steb by step, hand over hand cues. able to stand and transfer to [facility] with 1 person however requiring max/total assist; second person needed for STS from [facility] with pt leaning heavily to R and unable to correct. +2 needed to prevent fall with 3rd person assisting with environment manipulation to remove BSC and place recliner behind pt to prevent fall.    Ambulation/Gait               General Gait Details: unable   Stairs             Wheelchair Mobility    Modified Rankin (Stroke Patients Only)       Balance Overall balance assessment: Needs assistance Sitting-balance support: Bilateral upper extremity supported, Feet supported Sitting balance-Leahy Scale: Poor Sitting balance - Comments: posterior LOB, able to reach and maintain midline with incr time   Standing balance support: Bilateral upper extremity supported, Reliant on assistive device for balance, During functional activity Standing balance-Leahy Scale: Zero Standing balance comment: heavy 1 to 2  assist to maintain upright                            Cognition Arousal/Alertness: Suspect due to medications (very sleepy) Behavior During Therapy: Anxious Overall Cognitive Status: Impaired/Different from  baseline Area of Impairment: Orientation, Attention, Memory, Following commands, Safety/judgement, Awareness, Problem solving                 Orientation Level: Disoriented to, Time, Situation   Memory: Decreased recall of precautions, Decreased short-term memory Following Commands: Follows one step commands inconsistently Safety/Judgement: Decreased awareness of safety, Decreased awareness of deficits   Problem Solving: Slow processing, Decreased initiation, Difficulty sequencing, Requires verbal cues, Requires tactile cues General Comments: pt cognition decr this session. pt is internally distracted and requiring step by step, hand over hand cues to participate. pt is oriented to self with incr time, oriented to [facility] but unable to state why she is here even cued to look ather L knee/bandage        Exercises      General Comments        Pertinent Vitals/Pain Pain Assessment Pain Assessment: Faces Faces Pain Scale: Hurts little more Pain Location: L knee Pain Descriptors / Indicators: Discomfort, Sore, Grimacing, Operative site guarding, Moaning Pain Intervention(s): Limited activity within patient's tolerance, Monitored during session, Premedicated before session, Repositioned, Ice applied    Home Living                          Prior Function            PT Goals (current goals can now be found in the care plan section) Acute Rehab PT Goals PT Goal Formulation: With family Time For Goal Achievement: 10/15/21 Potential to Achieve Goals: Good Progress towards PT goals: Not progressing toward goals - comment (diminished cognition)    Frequency    7X/week      PT Plan Current plan remains appropriate    Co-evaluation              AM-PAC PT "6 Clicks" Mobility   Outcome Measure  Help needed turning from your back to your side while in a flat bed without using bedrails?: Total Help needed moving from lying on your back to sitting on the side  of a flat bed without using bedrails?: Total Help needed moving to and from a bed to a chair (including a wheelchair)?: Total Help needed standing up from a chair using your arms (e.g., wheelchair or bedside chair)?: Total Help needed to walk in hospital room?: Total Help needed climbing 3-5 steps with a railing? : Total 6 Click Score: 6    End of Session Equipment Utilized During Treatment: Gait belt Activity Tolerance: Other (comment);Patient limited by fatigue (limited by cognition) Patient left: in chair;with call bell/phone within reach;with chair alarm set;with family/visitor present Nurse Communication: Mobility status;Other (comment) (mental status) PT Visit Diagnosis: Muscle weakness (generalized) (M62.81);Difficulty in walking, not elsewhere classified (R26.2)     Time: 1137-1220 PT Time Calculation (min) (ACUTE ONLY): 43 min  Charges:                        Delice Bison, PT  Acute Rehab Dept [facility]) [phone] Pager [phone]  10/06/2021    [facility] 10/06/2021, 2:30 PM

## 2021-10-06 NOTE — Plan of Care (Signed)
  Problem: Education: Goal: Knowledge of the prescribed therapeutic regimen will improve Outcome: Progressing   Problem: Activity: Goal: Ability to avoid complications of mobility impairment will improve Outcome: Progressing   Problem: Pain Management: Goal: Pain level will decrease with appropriate interventions Outcome: Progressing   

## 2021-10-06 NOTE — Progress Notes (Signed)
RT placed patient on 3l of oxygen due to patients sats being 82%. Patient sats are now 92%. RN notified.

## 2021-10-06 NOTE — Progress Notes (Signed)
°   10/06/21 1500  PT Visit Information  Last PT Received On 10/06/21  Assistance Needed +2  Continues to be confused, lethargic, minimally more alert than earlier session; CT head pending. Continue to follow   History of Present Illness 86 yo female s/p L TKA 10/01/21. Hx of CVA, osteoporosis, asthma  Precautions  Precautions Fall;Knee  Precaution Comments incontinent  Restrictions  LLE Weight Bearing WBAT  Pain Assessment  Pain Assessment Faces  Faces Pain Scale 4  Pain Location L knee  Pain Descriptors / Indicators Discomfort;Sore;Grimacing;Operative site guarding;Moaning  Pain Intervention(s) Limited activity within patient's tolerance;Monitored during session;Premedicated before session;Repositioned  Cognition  Arousal/Alertness Lethargic;Suspect due to medications  Behavior During Therapy [facility] for tasks assessed/performed  Overall Cognitive Status Impaired/Different from baseline  Area of Impairment Orientation;Attention;Memory;Following commands;Safety/judgement;Awareness;Problem solving  Orientation Level Disoriented to;Place;Time;Situation  Current Attention Level Focused  Memory Decreased recall of precautions;Decreased short-term memory  Following Commands Follows one step commands inconsistently  Safety/Judgement Decreased awareness of safety;Decreased awareness of deficits  Problem Solving Slow processing;Decreased initiation;Difficulty sequencing;Requires verbal cues;Requires tactile cues  Bed Mobility  General bed mobility comments just back to bed with nursing, deferred OOB again d/t poor cognition  Total Joint Exercises  Ankle Circles/Pumps AROM;Both;10 reps;Limitations  Quad Sets AROM;Both;10 reps  Heel Slides AAROM;Left;Both;10 reps;5 reps (10 reps -gentle ROM L/5 reps R,limited by cognition)  Hip ABduction/ADduction AAROM;Left;10 reps  Goniometric ROM ~10 to 50 degrees  Ankle Circles/Pumps Limitations able to perform with tactile and verbal cues  PT - End of  Session  Activity Tolerance Other (comment) (limited by cognition)  Patient left with call bell/phone within reach;in bed;with bed alarm set;with family/visitor present;with nursing/sitter in room   PT - Assessment/Plan  PT Plan Current plan remains appropriate  PT Visit Diagnosis Muscle weakness (generalized) (M62.81);Difficulty in walking, not elsewhere classified (R26.2)  PT Frequency (ACUTE ONLY) 7X/week  Follow Up Recommendations Other (comment) (SNF vs HHPT pending progress)  Assistance recommended at discharge Frequent or constant Supervision/Assistance  Patient can return home with the following Two people to help with walking and/or transfers;Two people to help with bathing/dressing/bathroom;Assist for transportation;Assistance with cooking/housework;Help with stairs or ramp for entrance;Direct supervision/assist for financial management;Assistance with feeding  PT equipment Rolling walker (2 wheels)  AM-PAC PT "6 Clicks" Mobility Outcome Measure (Version 2)  Help needed turning from your back to your side while in a flat bed without using bedrails? 1  Help needed moving from lying on your back to sitting on the side of a flat bed without using bedrails? 1  Help needed moving to and from a bed to a chair (including a wheelchair)? 1  Help needed standing up from a chair using your arms (e.g., wheelchair or bedside chair)? 1  Help needed to walk in hospital room? 1  Help needed climbing 3-5 steps with a railing?  1  6 Click Score 6  Consider Recommendation of Discharge To: CIR/SNF/LTACH  PT Goal Progression  Progress towards PT goals Not progressing toward goals - comment  Acute Rehab PT Goals  PT Goal Formulation With family  Time For Goal Achievement 10/15/21  Potential to Achieve Goals Good  PT Time Calculation  PT Start Time (ACUTE ONLY) 1454  PT Stop Time (ACUTE ONLY) 1509  PT Time Calculation (min) (ACUTE ONLY) 15 min  PT General Charges  $$ ACUTE PT VISIT 1 Visit  PT  Treatments  $Therapeutic Exercise 8-22 mins

## 2021-10-06 NOTE — Progress Notes (Signed)
Subjective: 6 Days Post-Op Procedure(s) (LRB): TOTAL KNEE ARTHROPLASTY (Left) Patient reports pain as mild.   Nursing staff called PA on call last night with increased redness and swelling. Pt reports this AM she is not having any increased pain. Denies fever or chills. She is hopeful about going home today. Her leg is propped up on one pillow.  Objective: Vital signs in last 24 hours: Temp:  [98 F (36.7 C)-99.2 F (37.3 C)] 98.7 F (37.1 C) (01/22 0410) Pulse Rate:  [73-80] 80 (01/22 0410) Resp:  [16-17] 16 (01/22 0410) BP: (104-111)/(63-67) 111/67 (01/22 0410) SpO2:  [90 %-95 %] 92 % (01/22 0800)  Intake/Output from previous day: 01/21 0701 - 01/22 0700 In: 2116.3 [P.O.:1200; I.V.:916.3] Out: 1150 [Urine:1150] Intake/Output this shift: No intake/output data recorded.  No results for input(s): HGB in the last 72 hours. No results for input(s): WBC, RBC, HCT, PLT in the last 72 hours. No results for input(s): NA, K, CL, CO2, BUN, CREATININE, GLUCOSE, CALCIUM in the last 72 hours. No results for input(s): LABPT, INR in the last 72 hours.  Neurologically intact ABD soft Neurovascular intact Sensation intact distally Intact pulses distally Dorsiflexion/Plantar flexion intact Incision: no drainage Compartment soft No calf pain or sign of DVT   Assessment/Plan: 6 Days Post-Op Procedure(s) (LRB): TOTAL KNEE ARTHROPLASTY (Left) Advance diet Up with therapy D/C IV fluids Will recheck CBC this AM, order placed. At this point no sign of infection and do not feel abx needed. Erythema likely due to swelling, some increased bleeding as she is on Xarelto for DVT ppx. Needs more aggressive ice and elevation, toes above the nose, 20-30 min at a time 5-6x per day to reduce swelling PT can ambulate but hold off on aggressive ROM to allow swelling to improve If labs WNL and passes PT can consider D/C today otherwise plan for tomorrow Seen by myself and Dr Tenna Delaine Doralee Albino 10/06/2021, 8:08 AM

## 2021-10-06 NOTE — Progress Notes (Signed)
Initial Nutrition Assessment  INTERVENTION:   -Ensure Surgery PO BID, each provides 330 kcals and 18g protein   -Multivitamin with minerals daily  NUTRITION DIAGNOSIS:   Increased nutrient needs related to post-op healing as evidenced by estimated needs.  GOAL:   Patient will meet greater than or equal to 90% of their needs  MONITOR:   PO intake, Supplement acceptance, Labs, Weight trends, I & O's  REASON FOR ASSESSMENT:   Consult Assessment of nutrition requirement/status, Poor PO  ASSESSMENT:   86 y.o. female has a history of pain and functional disability in the left knee due to arthritis and has failed non-surgical conservative treatments for greater than 12 weeks to include NSAID's and/or analgesics, flexibility and strengthening excercises, and activity modification.  1/16: s/p TOTAL KNEE ARTHROPLASTY (Left)  Unable to reach patient at this time. Will attempt to gather history and complete NFPE in person at a later date.  Per chart review, pt has not been eating well since her knee surgery on 1/16. PO documentation shows 75-80% of meals yesterday. Pt's daughter provides feeding assistance and has brought food from outside the hospital for patient. Will order Ensure surgery supplements to aid in post-op healing.  Per weight records, pt's weight has increased since 1/4. Per nursing documentation, pt with mild BLE edema.  Medications: Miralax, Colace  Labs reviewed:    NUTRITION - FOCUSED PHYSICAL EXAM:  Unable to complete, working remotely.  Diet Order:   Diet Order             Diet - low sodium heart healthy           Diet - low sodium heart healthy           Diet regular Room service appropriate? Yes; Fluid consistency: Thin  Diet effective now                   EDUCATION NEEDS:   No education needs have been identified at this time  Skin:  Skin Assessment: Skin Integrity Issues: Skin Integrity Issues:: Incisions Incisions: 1/16 left  knee  Last BM:  1/19  Height:   Ht Readings from Last 1 Encounters:  09/30/21 5\' 3"  (1.6 m)    Weight:   Wt Readings from Last 1 Encounters:  09/30/21 63.1 kg    BMI:  Body mass index is 24.64 kg/m.  Estimated Nutritional Needs:   Kcal:  1500-1700  Protein:  75-85g  Fluid:  1.7L/day  10/02/21, MS, RD, LDN Inpatient Clinical Dietitian Contact information available via Amion

## 2021-10-07 ENCOUNTER — Inpatient Hospital Stay (HOSPITAL_COMMUNITY): Payer: Medicare PPO

## 2021-10-07 LAB — BASIC METABOLIC PANEL
Anion gap: 5 (ref 5–15)
BUN: 13 mg/dL (ref 8–23)
CO2: 26 mmol/L (ref 22–32)
Calcium: 8 mg/dL — ABNORMAL LOW (ref 8.9–10.3)
Chloride: 98 mmol/L (ref 98–111)
Creatinine, Ser: 0.51 mg/dL (ref 0.44–1.00)
GFR, Estimated: >60 mL/min (ref >60)
Glucose, Bld: 106 mg/dL — ABNORMAL HIGH (ref 70–99)
Potassium: 3.6 mmol/L (ref 3.5–5.1)
Sodium: 129 mmol/L — ABNORMAL LOW (ref 135–145)

## 2021-10-07 LAB — CBC
HCT: 26.4 % — ABNORMAL LOW (ref 36.0–46.0)
Hemoglobin: 8.4 g/dL — ABNORMAL LOW (ref 12.0–15.0)
MCH: 32.4 pg (ref 26.0–34.0)
MCHC: 31.8 g/dL (ref 30.0–36.0)
MCV: 101.9 fL — ABNORMAL HIGH (ref 80.0–100.0)
Platelets: 218 10*3/uL (ref 150–400)
RBC: 2.59 MIL/uL — ABNORMAL LOW (ref 3.87–5.11)
RDW: 14 % (ref 11.5–15.5)
WBC: 7.7 10*3/uL (ref 4.0–10.5)
nRBC: 0 % (ref 0.0–0.2)

## 2021-10-07 LAB — SARS CORONAVIRUS 2 (TAT 6-24 HRS): SARS Coronavirus 2: NEGATIVE

## 2021-10-07 NOTE — Plan of Care (Signed)
  Problem: Education: Goal: Knowledge of the prescribed therapeutic regimen will improve Outcome: Progressing   Problem: Pain Management: Goal: Pain level will decrease with appropriate interventions Outcome: Progressing   Problem: Activity: Goal: Ability to avoid complications of mobility impairment will improve Outcome: Progressing   

## 2021-10-07 NOTE — Progress Notes (Signed)
Physical Therapy Treatment Patient Details Name: Ariel Lambert MRN: [id number] DOB: [birth year] Today's Date: 10/07/2021   History of Present Illness 86 yo female s/p L TKA 10/01/21. Hx of CVA, osteoporosis, asthma    PT Comments    POD # 7 pm session General Comments: cognition improved 2nd IV antibiotics.  [place] daughter in room. General transfer comment: again assisted to [facility] as pt is incont urine upond standing.  Same tech as am session.  Still requiring + 2 assist. General Gait Details: had [place] Daughter "hands on" assist amb pt with instruction on safe handling, hand placement and need to advance walker as pt does not always self perform.  Initial severe posterior lean and difficulty weight shifting on L LE.  Very unsteady.  Straight chair  following for safety.  75% VC's on proper sequencing.  HIGH FALL RISK.General bed mobility comments: assisted back to + 2 assist (upper body/lower body).  Has [place] Daughter "hands on" assist supporting B LE up onto bed then scoot to [facility] using bed pad.  Positioned to comfort.  Instructed on L LE elevation but ensure knee extension.  "No pillow under knee". Family plans to take pt home however she may need SNF.   Recommendations for follow up therapy are one component of a multi-disciplinary discharge planning process, led by the attending physician.  Recommendations may be updated based on patient status, additional functional criteria and insurance authorization.  Follow Up Recommendations  Skilled nursing-short term rehab (<3 hours/day) (home if pt can mobilize with only + 1 assist)     Assistance Recommended at Discharge Frequent or constant Supervision/Assistance  Patient can return home with the following Two people to help with walking and/or transfers;Two people to help with bathing/dressing/bathroom;Assist for transportation;Assistance with cooking/housework;Help with stairs or ramp for entrance;Direct supervision/assist for  financial management;Assistance with feeding   Equipment Recommendations  Rolling walker (2 wheels) (youth)    Recommendations for Other Services       Precautions / Restrictions Precautions Precautions: Fall;Knee Precaution Comments: incontinent     Mobility  Bed Mobility               General bed mobility comments: assisted back to + 2 assist (upper body/lower body).  Has [place] Daughter "hands on" assist supporting B LE up onto bed then scoot to [facility] using bed pad.  Positioned to comfort.  Instructed on L LE elevation but ensure knee extension.  "No pillow under knee".    Transfers Overall transfer level: Needs assistance Equipment used: Rolling walker (2 wheels) Transfers: Sit to/from Stand Sit to Stand: +2 physical assistance, +2 safety/equipment, Mod assist   Step pivot transfers: Max assist       General transfer comment: again assisted to [facility] as pt is incont urine upond standing.  Same tech as am session.  Still requiring + 2 assist.    Ambulation/Gait Ambulation/Gait assistance: Max assist, +2 physical assistance, +2 safety/equipment Gait Distance (Feet): 16 Feet (8 feet x 2) Assistive device: Rolling walker (2 wheels) Gait Pattern/deviations: Step-to pattern, Trunk flexed, Antalgic, Decreased stance time - left Gait velocity: decreased     General Gait Details: had [place] Daughter "hands on" assist amb pt with instruction on safe handling, hand placement and need to advance walker as pt does not always self perform.  Initial severe posterior lean and difficulty weight shifting on L LE.  Very unsteady.  Straight chair  following for safety.  75% VC's on proper sequencing.  HIGH FALL RISK.  Stairs             Wheelchair Mobility    Modified Rankin (Stroke Patients Only)       Balance                                            Cognition Arousal/Alertness: Awake/alert   Overall Cognitive Status: Within Functional Limits  for tasks assessed                                 General Comments: cognition improved 2nd IV antibiotics.  [place] daughter in room.         Exercises      General Comments        Pertinent Vitals/Pain Pain Assessment Pain Assessment: Faces Faces Pain Scale: Hurts a little bit Pain Location: L knee Pain Descriptors / Indicators: Discomfort, Sore, Grimacing, Operative site guarding, Moaning Pain Intervention(s): Monitored during session, Premedicated before session, Repositioned, Ice applied    Home Living                          Prior Function            PT Goals (current goals can now be found in the care plan section) Progress towards PT goals: Progressing toward goals    Frequency    7X/week      PT Plan Current plan remains appropriate    Co-evaluation              AM-PAC PT "6 Clicks" Mobility   Outcome Measure  Help needed turning from your back to your side while in a flat bed without using bedrails?: A Lot Help needed moving from lying on your back to sitting on the side of a flat bed without using bedrails?: A Lot Help needed moving to and from a bed to a chair (including a wheelchair)?: A Lot Help needed standing up from a chair using your arms (e.g., wheelchair or bedside chair)?: A Lot Help needed to walk in hospital room?: A Lot Help needed climbing 3-5 steps with a railing? : Total 6 Click Score: 11    End of Session Equipment Utilized During Treatment: Gait belt Activity Tolerance: Patient limited by fatigue;Other (comment) (cognition) Patient left: in bed Nurse Communication: Mobility status PT Visit Diagnosis: Muscle weakness (generalized) (M62.81);Difficulty in walking, not elsewhere classified (R26.2)     Time: [id number] PT Time Calculation (min) (ACUTE ONLY): 33 min  Charges:  $Gait Training: 8-22 mins $Therapeutic Activity: 8-22 mins                     {Genea Rheaume  PTA Acute   Rehabilitation Services Pager      [phone] Office      [phone]

## 2021-10-07 NOTE — Progress Notes (Signed)
Physical Therapy Treatment Patient Details Name: Ariel Lambert MRN: [id number] DOB: Jan 25, 1936 Today's Date: 10/07/2021   History of Present Illness 86 yo female s/p L TKA 10/01/21. Hx of CVA, osteoporosis, asthma    PT Comments    POD # 7 General Comments: cognition improved after receiving IV antibiotics.  [place] daughter in room.  Pt already OOB in recliner finishing breakfast. Progress is still slow and pt is still requiring + 2 assist.  General transfer comment: first assisted from recliner to [facility] with instruction to daughter on proper set up and safe handling.  Pt required 50% VC's to avoid pulling up on walker and 75% VC's for turn completion and hand transfer.  Severe posterior lean.  Incomplete pivot.  Therapist guiding hips to correct location onto [facility].  Pt with difficulty tolerating weight shifting and WBing L knee due to pain.  Assisted off BSC as well as assist with peri care as pt is unable to self perform and maintain a safe static standing balance.  Instructed [place] Daughter on proper handling and use of gait belt. General Gait Details: had [place] Daughter "hands on" assist amb pt with instruction on safe handling, hand placement and need to advance walker as pt does not always self perform.  Initial severe posterior lean and difficulty weight shifting on L LE.  Very unsteady.  Recliner following for safety.  75% VC's on proper sequencing.  HIGH FALL RISK. Family plans to take pt home however pt needs to be at + 1 assist. Currently, pt requires [facility] and + 2 assist for all mobility. Realistically, pt needs SNF.   Recommendations for follow up therapy are one component of a multi-disciplinary discharge planning process, led by the attending physician.  Recommendations may be updated based on patient status, additional functional criteria and insurance authorization.  Follow Up Recommendations  Skilled nursing-short term rehab (<3 hours/day) (home if pt can mobilize  with only + 1 assist)     Assistance Recommended at Discharge Frequent or constant Supervision/Assistance  Patient can return home with the following Two people to help with walking and/or transfers;Two people to help with bathing/dressing/bathroom;Assist for transportation;Assistance with cooking/housework;Help with stairs or ramp for entrance;Direct supervision/assist for financial management;Assistance with feeding   Equipment Recommendations  Rolling walker (2 wheels) (youth)    Recommendations for Other Services       Precautions / Restrictions Precautions Precautions: Fall;Knee Precaution Comments: incontinent     Mobility  Bed Mobility               General bed mobility comments: OOB in recliner    Transfers Overall transfer level: Needs assistance Equipment used: Rolling walker (2 wheels)   Sit to Stand: +2 physical assistance, +2 safety/equipment, Mod assist   Step pivot transfers: Max assist       General transfer comment: first assisted from recliner to [facility] with instruction to daughter on proper set up and safe handling.  Pt required 50% VC's to avoid pulling up on walker and 75% VC's for turn completion and hand transfer.  Severe posterior lean.  Incomplete pivot.  Therapist guiding hips to correct location onto [facility].  Pt with difficulty tolerating weight shifting and WBing L knee due to pain.  Assisted off BSC as well as assist with peri care as pt is unable to self perform and maintain a safe static standing balance.  Instructed [place] Daughter on proper handling and use of gait belt.    Ambulation/Gait Ambulation/Gait assistance: Max assist, +  2 physical assistance, +2 safety/equipment Gait Distance (Feet): 22 Feet Assistive device: Rolling walker (2 wheels) Gait Pattern/deviations: Step-to pattern, Trunk flexed, Antalgic, Decreased stance time - left Gait velocity: decreased     General Gait Details: had [place] Daughter "hands on" assist amb pt with  instruction on safe handling, hand placement and need to advance walker as pt does not always self perform.  Initial severe posterior lean and difficulty weight shifting on L LE.  Very unsteady.  Recliner following for safety.  75% VC's on proper sequencing.  HIGH FALL RISK.   Stairs             Wheelchair Mobility    Modified Rankin (Stroke Patients Only)       Balance                                            Cognition Arousal/Alertness: Awake/alert   Overall Cognitive Status: Within Functional Limits for tasks assessed                                 General Comments: cognition improved 2nd IV antibiotics.  [place] daughter in room.  Pt already OOB in recliner finishing breakfast.        Exercises      General Comments        Pertinent Vitals/Pain Pain Assessment Pain Assessment: Faces Faces Pain Scale: Hurts a little bit Pain Location: L knee Pain Descriptors / Indicators: Discomfort, Sore, Grimacing, Operative site guarding, Moaning Pain Intervention(s): Monitored during session, Premedicated before session, Repositioned, Ice applied    Home Living                          Prior Function            PT Goals (current goals can now be found in the care plan section) Progress towards PT goals: Progressing toward goals    Frequency    7X/week      PT Plan Current plan remains appropriate    Co-evaluation              AM-PAC PT "6 Clicks" Mobility   Outcome Measure  Help needed turning from your back to your side while in a flat bed without using bedrails?: A Lot Help needed moving from lying on your back to sitting on the side of a flat bed without using bedrails?: A Lot Help needed moving to and from a bed to a chair (including a wheelchair)?: A Lot Help needed standing up from a chair using your arms (e.g., wheelchair or bedside chair)?: A Lot Help needed to walk in hospital room?: A  Lot Help needed climbing 3-5 steps with a railing? : Total 6 Click Score: 11    End of Session Equipment Utilized During Treatment: Gait belt Activity Tolerance: Patient limited by fatigue;Other (comment) (cognition) Patient left: with call bell/phone within reach;in bed;with bed alarm set;with family/visitor present;with nursing/sitter in room Nurse Communication: Mobility status PT Visit Diagnosis: Muscle weakness (generalized) (M62.81);Difficulty in walking, not elsewhere classified (R26.2)     Time: 6761-9509 PT Time Calculation (min) (ACUTE ONLY): 48 min  Charges:  $Gait Training: 8-22 mins $Therapeutic Activity: 23-37 mins                     {  Rica Koyanagi  PTA Acute  Rehabilitation Services Pager      [phone] Office      [phone]

## 2021-10-07 NOTE — Progress Notes (Signed)
Continuous pulse Ox started having difficulty picking up pulse.  Heart sounds with some irregularity.  Obtaining 12 lead ECG so that [facility] On[facility] could be updated.  Patient stated "I feel weak".  Able to drink a cup of water and use incentive spirometer.  O2 had been applied by RT D/T desaturated condition when medicated.  ECG, abnormal strips and normal strips printed, and on chart. "Sinus rhythm with frequent premature ventricular complexes and fusion complexes  Nonspecific ST and T wave abnormality  Prolonged QT... supraventricular complexes...  Rapid Response consulted via phone for interpretation of strips.  VS stable no additional pain, no SOB, fingers slightly cool dry and causing pulse ox not to read well. No further complaint.  Writer decided to inform Attending MD in AM and request order for the ECG performed.

## 2021-10-07 NOTE — Progress Notes (Signed)
° °  Subjective: 7 Days Post-Op Procedure(s) (LRB): TOTAL KNEE ARTHROPLASTY (Left) Patient seen in rounds by Dr. Lequita Halt. Continues to be weak and fatigued. Denies chest pain or SOB.  Objective: Vital signs in last 24 hours: Temp:  [97.7 F (36.5 C)-100.9 F (38.3 C)] 97.7 F (36.5 C) (01/23 0439) Pulse Rate:  [55-82] 55 (01/23 0439) Resp:  [16-18] 18 (01/23 0439) BP: (109-124)/(60-93) 124/93 (01/23 0439) SpO2:  [82 %-100 %] 92 % (01/23 0439)  Intake/Output from previous day:  Intake/Output Summary (Last 24 hours) at 10/07/2021 0716 Last data filed at 10/07/2021 0600 Gross per 24 hour  Intake 2601.07 ml  Output 1750 ml  Net 851.07 ml    Intake/Output this shift: No intake/output data recorded.  Labs: Recent Labs    10/06/21 0842  HGB 8.2*   Recent Labs    10/06/21 0842  WBC 8.9  RBC 2.54*  HCT 25.4*  PLT 237   Recent Labs    10/06/21 0842  NA 129*  K 3.6  CL 95*  CO2 27  BUN 13  CREATININE 0.49  GLUCOSE 95  CALCIUM 8.0*   No results for input(s): LABPT, INR in the last 72 hours.  Exam: General - Patient is Alert and Oriented Extremity - Neurologically intact Neurovascular intact Sensation intact distally Dorsiflexion/Plantar flexion intact Dressing/Incision - clean, dry, no drainage Motor Function - intact, moving foot and toes well on exam.   Past Medical History:  Diagnosis Date   Allergy    Arthritis    ASTHMA 05/03/2009   Asthma    Carotid stenosis    Chronic kidney disease    " in remission"   COPD (chronic obstructive pulmonary disease) (HCC)    CVA (cerebral infarction)    Dyslipidemia    Headache    Hearing aid worn    B/L   HYPERTENSION 04/30/2007   HYPERTHYROIDISM 06/17/2007   Hyperthyroidism    Menopause    Multinodular goiter    OSTEOPOROSIS 04/30/2007   Stroke (HCC)    Wears glasses     Assessment/Plan: 7 Days Post-Op Procedure(s) (LRB): TOTAL KNEE ARTHROPLASTY (Left) Principal Problem:   Primary osteoarthritis of  left knee  Estimated body mass index is 24.64 kg/m as calculated from the following:   Height as of this encounter: 5\' 3"  (1.6 m).   Weight as of this encounter: 63.1 kg. Up with therapy  DVT Prophylaxis - Xarelto Weight-bearing as tolerated  Portable CXR and repeat labs ordered in response to overnight events (detailed in RNs note). Appreciative of the hospitalist team's consult and assistance.  , PA-C Orthopedic Surgery [phone] 10/07/2021, 7:16 AM

## 2021-10-07 NOTE — Progress Notes (Signed)
Orthopedic Tech Progress Note Patient Details:  Ariel Lambert August 03, 1936 [id number]  Patient ID: Ariel Lambert, female   DOB: [birth year], 86 y.o.   MRN: [id number]  Ariel Lambert 10/07/2021,  CPM PICKUP

## 2021-10-07 NOTE — Care Management Important Message (Signed)
Important Message  Patient Details IM Letter placed in Patients room. Name: Ariel Lambert MRN: [id number] Date of Birth: Mar 11, 1936   Medicare Important Message Given:  Yes     Kerin Salen 10/07/2021, 2:15 PM

## 2021-10-08 NOTE — Progress Notes (Signed)
° °  Subjective: 8 Days Post-Op Procedure(s) (LRB): TOTAL KNEE ARTHROPLASTY (Left) Patient reports pain as mild.   Patient seen in rounds by Dr. Lequita Halt. Patient appears much better this AM. Denies chest pain or SOB. CXR negative yesterday.  Plan is to go Home after hospital stay.  Objective: Vital signs in last 24 hours: Temp:  [97.7 F (36.5 C)-98.7 F (37.1 C)] 97.8 F (36.6 C) (01/24 0459) Pulse Rate:  [62-73] 67 (01/24 0459) Resp:  [16] 16 (01/24 0459) BP: (101-112)/(56-68) 101/56 (01/24 0459) SpO2:  [94 %-100 %] 97 % (01/24 0459)  Intake/Output from previous day:  Intake/Output Summary (Last 24 hours) at 10/08/2021 0741 Last data filed at 10/08/2021 0600 Gross per 24 hour  Intake 3517.23 ml  Output 1650 ml  Net 1867.23 ml    Intake/Output this shift: No intake/output data recorded.  Labs: Recent Labs    10/06/21 0842 10/07/21 0750  HGB 8.2* 8.4*   Recent Labs    10/06/21 0842 10/07/21 0750  WBC 8.9 7.7  RBC 2.54* 2.59*  HCT 25.4* 26.4*  PLT 237 218   Recent Labs    10/06/21 0842 10/07/21 0750  NA 129* 129*  K 3.6 3.6  CL 95* 98  CO2 27 26  BUN 13 13  CREATININE 0.49 0.51  GLUCOSE 95 106*  CALCIUM 8.0* 8.0*   No results for input(s): LABPT, INR in the last 72 hours.  Exam: General - Patient is Alert and Oriented Extremity - Neurologically intact Neurovascular intact Sensation intact distally Dorsiflexion/Plantar flexion intact Dressing/Incision - clean, dry, no drainage Motor Function - intact, moving foot and toes well on exam.   Past Medical History:  Diagnosis Date   Allergy    Arthritis    ASTHMA 05/03/2009   Asthma    Carotid stenosis    Chronic kidney disease    " in remission"   COPD (chronic obstructive pulmonary disease) (HCC)    CVA (cerebral infarction)    Dyslipidemia    Headache    Hearing aid worn    B/L   HYPERTENSION 04/30/2007   HYPERTHYROIDISM 06/17/2007   Hyperthyroidism    Menopause    Multinodular goiter     OSTEOPOROSIS 04/30/2007   Stroke (HCC)    Wears glasses     Assessment/Plan: 8 Days Post-Op Procedure(s) (LRB): TOTAL KNEE ARTHROPLASTY (Left) Principal Problem:   Primary osteoarthritis of left knee  Estimated body mass index is 24.64 kg/m as calculated from the following:   Height as of this encounter: 5\' 3"  (1.6 m).   Weight as of this encounter: 63.1 kg. Up with therapy  DVT Prophylaxis - Xarelto Weight-bearing as tolerated  Plan for discharge to home with HHPT if improvement in mobilization is made today with PT.   , PA-C Orthopedic Surgery [phone] 10/08/2021, 7:41 AM

## 2021-10-08 NOTE — Progress Notes (Addendum)
Physical Therapy Treatment Patient Details Name: Ariel Lambert MRN: [id number] DOB: 1936/04/29 Today's Date: 10/08/2021   History of Present Illness 86 yo female s/p L TKA 10/01/21. Hx of CVA, osteoporosis, asthma    PT Comments    Progressing with mobility. Moderate pain with activity. Will plan to continue gait and stair training on tomorrow.    Recommendations for follow up therapy are one component of a multi-disciplinary discharge planning process, led by the attending physician.  Recommendations may be updated based on patient status, additional functional criteria and insurance authorization.  Follow Up Recommendations  Follow physician's recommendations for discharge plan and follow up therapies     Assistance Recommended at Discharge Frequent or constant Supervision/Assistance  Patient can return home with the following A lot of help with walking and/or transfers;A lot of help with bathing/dressing/bathroom;Help with stairs or ramp for entrance;Assist for transportation;Assistance with cooking/housework;Direct supervision/assist for medications management   Equipment Recommendations  Rolling walker (2 wheels) (youth height)    Recommendations for Other Services       Precautions / Restrictions Precautions Precautions: Fall;Knee Restrictions Weight Bearing Restrictions: No LLE Weight Bearing: Weight bearing as tolerated     Mobility  Bed Mobility Overal bed mobility: Needs Assistance Bed Mobility: Supine to Sit     Supine to sit: Min assist, HOB elevated     General bed mobility comments: Assist for L LE and trunk. Pt relied on bedrail. Increased time. Cueing required.    Transfers Overall transfer level: Needs assistance Equipment used: Rolling walker (2 wheels) Transfers: Sit to/from Stand Sit to Stand: Mod assist, +2 safety/equipment   Step pivot transfers: Min assist, +2 safety/equipment       General transfer comment: Improved performance  with repetition. Cues for safety, technique, hand placement.    Ambulation/Gait Ambulation/Gait assistance: Min assist, +2 safety/equipment Gait Distance (Feet): 50 Feet Assistive device: Rolling walker (2 wheels) Gait Pattern/deviations: Step-to pattern, Trunk flexed, Antalgic, Decreased stance time - left       General Gait Details: Cues for safety, technique, sequence, posture, RW proximity, step lengths, pacing. Assist to stabilize pt throughout distance. Followed closely with recliner and used it to transport pt back to room.   Stairs             Wheelchair Mobility    Modified Rankin (Stroke Patients Only)       Balance Overall balance assessment: Needs assistance, History of Falls         Standing balance support: Bilateral upper extremity supported, Reliant on assistive device for balance, During functional activity Standing balance-Leahy Scale: Poor                              Cognition Arousal/Alertness: Awake/alert Behavior During Therapy: WFL for tasks assessed/performed Overall Cognitive Status: Impaired/Different from baseline Area of Impairment: Problem solving                       Following Commands: Follows one step commands with increased time     Problem Solving: Requires verbal cues, Requires tactile cues          Exercises      General Comments        Pertinent Vitals/Pain Pain Assessment Pain Assessment: 0-10 Pain Score: 6  Pain Location: L knee Pain Descriptors / Indicators: Discomfort, Sore, Grimacing, Operative site guarding, Moaning Pain Intervention(s): Limited activity within patient's tolerance, Monitored during session, Ice  applied    Home Living                          Prior Function            PT Goals (current goals can now be found in the care plan section) Progress towards PT goals: Progressing toward goals    Frequency    7X/week      PT Plan Current plan  remains appropriate    Co-evaluation              AM-PAC PT "6 Clicks" Mobility   Outcome Measure  Help needed turning from your back to your side while in a flat bed without using bedrails?: A Lot Help needed moving from lying on your back to sitting on the side of a flat bed without using bedrails?: A Lot Help needed moving to and from a bed to a chair (including a wheelchair)?: A Lot Help needed standing up from a chair using your arms (e.g., wheelchair or bedside chair)?: A Lot Help needed to walk in hospital room?: A Lot Help needed climbing 3-5 steps with a railing? : A Lot 6 Click Score: 12    End of Session Equipment Utilized During Treatment: Gait belt Activity Tolerance: Patient limited by fatigue;Patient limited by pain Patient left: in chair;with call bell/phone within reach;with family/visitor present   PT Visit Diagnosis: Muscle weakness (generalized) (M62.81);Difficulty in walking, not elsewhere classified (R26.2)     Time: 4010-2725 PT Time Calculation (min) (ACUTE ONLY): 28 min  Charges:  $Gait Training: 23-37 mins             Faye Ramsay, PT Acute Rehabilitation  Office: [phone] Pager: [phone]

## 2021-10-08 NOTE — Progress Notes (Addendum)
Physical Therapy Treatment Patient Details Name: Ariel Lambert MRN: [id number] DOB: [birth year] Today's Date: 10/08/2021   History of Present Illness 86 yo female s/p L TKA 10/01/21. Hx of CVA, osteoporosis, asthma    PT Comments    Progressing slowly. Still requires +2 assist for safe mobility. Remains at high risk for falls. Daughter present to observe.    Recommendations for follow up therapy are one component of a multi-disciplinary discharge planning process, led by the attending physician.  Recommendations may be updated based on patient status, additional functional criteria and insurance authorization.  Follow Up Recommendations  Follow physician's recommendations for discharge plan and follow up therapies (may need ST SNF if pt does not progress well)     Assistance Recommended at Discharge Frequent or constant Supervision/Assistance  Patient can return home with the following A lot of help with walking and/or transfers;A lot of help with bathing/dressing/bathroom;Help with stairs or ramp for entrance;Assist for transportation;Assistance with cooking/housework;Direct supervision/assist for medications management   Equipment Recommendations  Rolling walker (2 wheels) (youth height)    Recommendations for Other Services       Precautions / Restrictions Precautions Precautions: Fall;Knee Precaution Comments: incontinent Restrictions Weight Bearing Restrictions: No LLE Weight Bearing: Weight bearing as tolerated     Mobility  Bed Mobility Overal bed mobility: Needs Assistance Bed Mobility: Supine to Sit     Supine to sit: HOB elevated, Mod assist     General bed mobility comments: Assist for L LE and trunk. Increased time. Cueing required.    Transfers Overall transfer level: Needs assistance Equipment used: Rolling walker (2 wheels) Transfers: Sit to/from Stand Sit to Stand: Min assist, +2 physical assistance, +2 safety/equipment, From elevated  surface           General transfer comment: Improved performance with repetition. Cues for safety, technique, hand placement. High fall risk with LOB to L side x 1.    Ambulation/Gait Ambulation/Gait assistance: Min assist, +2 safety/equipment Gait Distance (Feet): 25 Feet Assistive device: Rolling walker (2 wheels) Gait Pattern/deviations: Step-to pattern, Trunk flexed, Antalgic, Decreased stance time - left       General Gait Details: Cues for safety, technique, sequence, posture, RW proximity. Assist to stabilize pt throughout distance. Followed closely with recliner and used it to transport pt back to room. High fall risk with LOB posteriorly with head turns. She can also be a bit distracted by the environment   Stairs             Wheelchair Mobility    Modified Rankin (Stroke Patients Only)       Balance Overall balance assessment: Needs assistance, History of Falls         Standing balance support: Bilateral upper extremity supported, Reliant on assistive device for balance, During functional activity Standing balance-Leahy Scale: Poor                              Cognition Arousal/Alertness: Awake/alert Behavior During Therapy: WFL for tasks assessed/performed Overall Cognitive Status: Impaired/Different from baseline Area of Impairment: Problem solving                       Following Commands: Follows one step commands with increased time     Problem Solving: Requires verbal cues, Requires tactile cues          Exercises      General Comments  Pertinent Vitals/Pain Pain Assessment Pain Assessment: 0-10 Pain Score: 9  Pain Location: L knee Pain Descriptors / Indicators: Discomfort, Sore, Grimacing, Operative site guarding, Moaning Pain Intervention(s): Limited activity within patient's tolerance, Monitored during session, Ice applied, Repositioned, Premedicated before session    Home Living                           Prior Function            PT Goals (current goals can now be found in the care plan section) Progress towards PT goals: Progressing toward goals    Frequency    7X/week      PT Plan Current plan remains appropriate    Co-evaluation              AM-PAC PT "6 Clicks" Mobility   Outcome Measure  Help needed turning from your back to your side while in a flat bed without using bedrails?: A Lot Help needed moving from lying on your back to sitting on the side of a flat bed without using bedrails?: A Lot Help needed moving to and from a bed to a chair (including a wheelchair)?: A Lot Help needed standing up from a chair using your arms (e.g., wheelchair or bedside chair)?: A Lot Help needed to walk in hospital room?: A Lot Help needed climbing 3-5 steps with a railing? : Total 6 Click Score: 11    End of Session Equipment Utilized During Treatment: Gait belt Activity Tolerance: Patient limited by fatigue;Patient limited by pain Patient left: in chair;with call bell/phone within reach;with family/visitor present   PT Visit Diagnosis: Muscle weakness (generalized) (M62.81);Difficulty in walking, not elsewhere classified (R26.2)     Time: 6568-1275 PT Time Calculation (min) (ACUTE ONLY): 36 min  Charges:  $Gait Training: 23-37 mins                         Faye Ramsay, PT Acute Rehabilitation  Office: [phone] Pager: [phone]

## 2021-10-09 NOTE — Progress Notes (Signed)
Physical Therapy Treatment Patient Details Name: Ariel Lambert MRN: [id number] DOB: 11-21-1935 Today's Date: 10/09/2021   History of Present Illness 86 yo female s/p L TKA 10/01/21. Hx of CVA, osteoporosis, asthma    PT Comments    Progressing with mobility. Continues to require 24/7 supervision/assist. Had daughter practice with pt on today. Will plan to have a 2nd session to attempt/practice stair negotiation.    Recommendations for follow up therapy are one component of a multi-disciplinary discharge planning process, led by the attending physician.  Recommendations may be updated based on patient status, additional functional criteria and insurance authorization.  Follow Up Recommendations  Follow physician's recommendations for discharge plan and follow up therapies     Assistance Recommended at Discharge Frequent or constant Supervision/Assistance  Patient can return home with the following A lot of help with walking and/or transfers;A lot of help with bathing/dressing/bathroom;Help with stairs or ramp for entrance;Assist for transportation;Assistance with cooking/housework;Direct supervision/assist for medications management   Equipment Recommendations  Rolling walker (2 wheels) (youth height)    Recommendations for Other Services       Precautions / Restrictions Precautions Precautions: Fall;Knee Precaution Comments: incontinent Restrictions Weight Bearing Restrictions: No LLE Weight Bearing: Weight bearing as tolerated     Mobility  Bed Mobility               General bed mobility comments: oob in recliner    Transfers Overall transfer level: Needs assistance Equipment used: Rolling walker (2 wheels) Transfers: Sit to/from Stand Sit to Stand: Mod assist   Step pivot transfers: Min assist       General transfer comment: Improved performance with repetition. Cues for safety, technique, hand placement. Had daughter practice cueing and  assisting.    Ambulation/Gait Ambulation/Gait assistance: Min assist Gait Distance (Feet): 45 Feet Assistive device: Rolling walker (2 wheels) Gait Pattern/deviations: Step-to pattern, Trunk flexed, Antalgic, Decreased stance time - left       General Gait Details: Cues for safety, technique, sequence, posture, RW proximity, step lengths, pacing. Assist to stabilize pt throughout distance. Did not follow with recliner this session. Had daughter practice cueing and assisting pt.   Stairs             Wheelchair Mobility    Modified Rankin (Stroke Patients Only)       Balance Overall balance assessment: Needs assistance, History of Falls         Standing balance support: Bilateral upper extremity supported, Reliant on assistive device for balance, During functional activity Standing balance-Leahy Scale: Poor                              Cognition Arousal/Alertness: Awake/alert Behavior During Therapy: WFL for tasks assessed/performed Overall Cognitive Status: Impaired/Different from baseline                         Following Commands: Follows one step commands with increased time     Problem Solving: Requires verbal cues, Requires tactile cues          Exercises      General Comments        Pertinent Vitals/Pain Pain Assessment Pain Assessment: 0-10 Pain Score: 5  Pain Location: L knee Pain Descriptors / Indicators: Discomfort, Sore, Grimacing, Operative site guarding Pain Intervention(s): Limited activity within patient's tolerance, Monitored during session, Repositioned, Ice applied    Home Living  Prior Function            PT Goals (current goals can now be found in the care plan section) Progress towards PT goals: Progressing toward goals    Frequency    7X/week      PT Plan Current plan remains appropriate    Co-evaluation              AM-PAC PT "6 Clicks"  Mobility   Outcome Measure  Help needed turning from your back to your side while in a flat bed without using bedrails?: A Little Help needed moving from lying on your back to sitting on the side of a flat bed without using bedrails?: A Little Help needed moving to and from a bed to a chair (including a wheelchair)?: A Little Help needed standing up from a chair using your arms (e.g., wheelchair or bedside chair)?: A Little Help needed to walk in hospital room?: A Little Help needed climbing 3-5 steps with a railing? : A Lot 6 Click Score: 17    End of Session Equipment Utilized During Treatment: Gait belt Activity Tolerance: Patient tolerated treatment well;Patient limited by fatigue Patient left: in chair;with call bell/phone within reach;with family/visitor present   PT Visit Diagnosis: Muscle weakness (generalized) (M62.81);Difficulty in walking, not elsewhere classified (R26.2)     Time: 6387-5643 PT Time Calculation (min) (ACUTE ONLY): 29 min  Charges:  $Gait Training: 23-37 mins                         Faye Ramsay, PT Acute Rehabilitation  Office: [phone] Pager: [phone]

## 2021-10-09 NOTE — Progress Notes (Signed)
Physical Therapy Treatment Patient Details Name: Ariel Lambert MRN: [id number] DOB: 30-Oct-1935 Today's Date: 10/09/2021   History of Present Illness 86 yo female s/p L TKA 10/01/21. Hx of CVA, osteoporosis, asthma    PT Comments    2nd session to practice stair negotiation. Daughter was present to practice with pt. Strongly recommend +2 assist for stairs. Also strongly recommend 24/7 care until home health therapy informs pt/family otherwise. All education completed.     Recommendations for follow up therapy are one component of a multi-disciplinary discharge planning process, led by the attending physician.  Recommendations may be updated based on patient status, additional functional criteria and insurance authorization.  Follow Up Recommendations  Follow physician's recommendations for discharge plan and follow up therapies     Assistance Recommended at Discharge Frequent or constant Supervision/Assistance  Patient can return home with the following A lot of help with walking and/or transfers;A lot of help with bathing/dressing/bathroom;Help with stairs or ramp for entrance;Assist for transportation;Assistance with cooking/housework;Direct supervision/assist for medications management   Equipment Recommendations  Rolling walker (2 wheels) (youth height)    Recommendations for Other Services       Precautions / Restrictions Precautions Precautions: Fall;Knee Precaution Comments: incontinent Restrictions Weight Bearing Restrictions: No LLE Weight Bearing: Weight bearing as tolerated     Mobility  Bed Mobility               General bed mobility comments: oob in recliner    Transfers Overall transfer level: Needs assistance Equipment used: Rolling walker (2 wheels) Transfers: Sit to/from Stand Sit to Stand: Mod assist   Step pivot transfers: Min assist       General transfer comment: Assist to power up, stabilize, control descent. Cues for safety,  technique, hand/feet placement.    Ambulation/Gait Ambulation/Gait assistance: Min assist Gait Distance (Feet): 10 Feet Assistive device: Rolling walker (2 wheels) Gait Pattern/deviations: Step-to pattern, Trunk flexed, Antalgic, Decreased stance time - left       General Gait Details: Cues for safety, technique, sequence, posture, RW proximity, step lengths, pacing. Assist to stabilize pt throughout distance.   Stairs Stairs: Yes Stairs assistance: Min assist, +2 physical assistance, +2 safety/equipment   Number of Stairs: 2 General stair comments: up and over portable stairs x 1. cues for safety, technique, sequence. assist to stabilize pt and cane. +2 for safety. Daughter present to practice.   Wheelchair Mobility    Modified Rankin (Stroke Patients Only)       Balance Overall balance assessment: Needs assistance, History of Falls         Standing balance support: Bilateral upper extremity supported, Reliant on assistive device for balance, During functional activity Standing balance-Leahy Scale: Poor                              Cognition Arousal/Alertness: Awake/alert Behavior During Therapy: WFL for tasks assessed/performed Overall Cognitive Status: Impaired/Different from baseline                         Following Commands: Follows one step commands with increased time     Problem Solving: Requires verbal cues, Requires tactile cues          Exercises      General Comments        Pertinent Vitals/Pain Pain Assessment Pain Assessment: Faces Pain Score: 5  Faces Pain Scale: Hurts even more Pain Location: L knee Pain  Descriptors / Indicators: Discomfort, Sore, Grimacing, Operative site guarding Pain Intervention(s): Limited activity within patient's tolerance, Monitored during session, Repositioned    Home Living                          Prior Function            PT Goals (current goals can now be found  in the care plan section) Progress towards PT goals: Progressing toward goals    Frequency    7X/week      PT Plan Current plan remains appropriate    Co-evaluation              AM-PAC PT "6 Clicks" Mobility   Outcome Measure  Help needed turning from your back to your side while in a flat bed without using bedrails?: A Little Help needed moving from lying on your back to sitting on the side of a flat bed without using bedrails?: A Little Help needed moving to and from a bed to a chair (including a wheelchair)?: A Little Help needed standing up from a chair using your arms (e.g., wheelchair or bedside chair)?: A Lot Help needed to walk in hospital room?: A Little Help needed climbing 3-5 steps with a railing? : A Lot 6 Click Score: 16    End of Session Equipment Utilized During Treatment: Gait belt Activity Tolerance: Patient tolerated treatment well;Patient limited by fatigue Patient left: in chair;with call bell/phone within reach;with family/visitor present   PT Visit Diagnosis: Muscle weakness (generalized) (M62.81);Difficulty in walking, not elsewhere classified (R26.2)     Time: 0981-1914 PT Time Calculation (min) (ACUTE ONLY): 33 min  Charges:  $Gait Training: 23-37 mins                         Faye Ramsay, PT Acute Rehabilitation  Office: [phone] Pager: [phone]

## 2021-10-09 NOTE — Progress Notes (Signed)
° °  Subjective: 9 Days Post-Op Procedure(s) (LRB): TOTAL KNEE ARTHROPLASTY (Left) Patient reports pain as mild.   Patient seen in rounds by Dr. Lequita Halt. Patient continues to improve. Did much better with second session of PT yesterday, patient and daughter are ready for discharge. Denies chest pain or SOB.  Plan is to go Home after hospital stay.  Objective: Vital signs in last 24 hours: Temp:  [98 F (36.7 C)-98.8 F (37.1 C)] 98.8 F (37.1 C) (01/25 0527) Pulse Rate:  [67-73] 67 (01/25 0527) Resp:  [16-20] 16 (01/25 0527) BP: (106-141)/(68-70) 124/70 (01/25 0527) SpO2:  [92 %-95 %] 95 % (01/25 0527)  Intake/Output from previous day:  Intake/Output Summary (Last 24 hours) at 10/09/2021 0828 Last data filed at 10/09/2021 0600 Gross per 24 hour  Intake 1755.69 ml  Output 420 ml  Net 1335.69 ml    Intake/Output this shift: No intake/output data recorded.  Labs: Recent Labs    10/06/21 0842 10/07/21 0750  HGB 8.2* 8.4*   Recent Labs    10/06/21 0842 10/07/21 0750  WBC 8.9 7.7  RBC 2.54* 2.59*  HCT 25.4* 26.4*  PLT 237 218   Recent Labs    10/06/21 0842 10/07/21 0750  NA 129* 129*  K 3.6 3.6  CL 95* 98  CO2 27 26  BUN 13 13  CREATININE 0.49 0.51  GLUCOSE 95 106*  CALCIUM 8.0* 8.0*   No results for input(s): LABPT, INR in the last 72 hours.  Exam: General - Patient is Alert and Oriented Extremity - Neurologically intact Neurovascular intact Sensation intact distally Dorsiflexion/Plantar flexion intact Dressing/Incision - clean, dry, no drainage Motor Function - intact, moving foot and toes well on exam.   Past Medical History:  Diagnosis Date   Allergy    Arthritis    ASTHMA 05/03/2009   Asthma    Carotid stenosis    Chronic kidney disease    " in remission"   COPD (chronic obstructive pulmonary disease) (HCC)    CVA (cerebral infarction)    Dyslipidemia    Headache    Hearing aid worn    B/L   HYPERTENSION 04/30/2007   HYPERTHYROIDISM  06/17/2007   Hyperthyroidism    Menopause    Multinodular goiter    OSTEOPOROSIS 04/30/2007   Stroke (HCC)    Wears glasses     Assessment/Plan: 9 Days Post-Op Procedure(s) (LRB): TOTAL KNEE ARTHROPLASTY (Left) Principal Problem:   Primary osteoarthritis of left knee  Estimated body mass index is 24.64 kg/m as calculated from the following:   Height as of this encounter: 5\' 3"  (1.6 m).   Weight as of this encounter: 63.1 kg. Up with therapy  DVT Prophylaxis - Xarelto Weight-bearing as tolerated  Plan for discharge today with HHPT.  , PA-C Orthopedic Surgery [phone] 10/09/2021, 8:28 AM

## 2021-10-12 DIAGNOSIS — I129 Hypertensive chronic kidney disease with stage 1 through stage 4 chronic kidney disease, or unspecified chronic kidney disease: Secondary | ICD-10-CM | POA: Diagnosis not present

## 2021-10-12 DIAGNOSIS — F32A Depression, unspecified: Secondary | ICD-10-CM | POA: Diagnosis not present

## 2021-10-12 DIAGNOSIS — E785 Hyperlipidemia, unspecified: Secondary | ICD-10-CM | POA: Diagnosis not present

## 2021-10-12 DIAGNOSIS — E039 Hypothyroidism, unspecified: Secondary | ICD-10-CM | POA: Diagnosis not present

## 2021-10-12 DIAGNOSIS — Z471 Aftercare following joint replacement surgery: Secondary | ICD-10-CM | POA: Diagnosis not present

## 2021-10-12 DIAGNOSIS — E46 Unspecified protein-calorie malnutrition: Secondary | ICD-10-CM | POA: Diagnosis not present

## 2021-10-12 DIAGNOSIS — E559 Vitamin D deficiency, unspecified: Secondary | ICD-10-CM | POA: Diagnosis not present

## 2021-10-12 DIAGNOSIS — E042 Nontoxic multinodular goiter: Secondary | ICD-10-CM | POA: Diagnosis not present

## 2021-10-12 DIAGNOSIS — J309 Allergic rhinitis, unspecified: Secondary | ICD-10-CM | POA: Diagnosis not present

## 2021-10-16 NOTE — Discharge Summary (Signed)
Physician Discharge Summary   Patient ID: Ariel Lambert MRN: [id number] DOB/AGE: Jun 22, 1936 86 y.o.  Admit date: 09/30/2021 Discharge date: 10/09/2021  Primary Diagnosis: Osteoarthritis, left knee   Admission Diagnoses:  Past Medical History:  Diagnosis Date   Allergy    Arthritis    ASTHMA 05/03/2009   Asthma    Carotid stenosis    Chronic kidney disease    " in remission"   COPD (chronic obstructive pulmonary disease) (HCC)    CVA (cerebral infarction)    Dyslipidemia    Headache    Hearing aid worn    B/L   HYPERTENSION 04/30/2007   HYPERTHYROIDISM 06/17/2007   Hyperthyroidism    Menopause    Multinodular goiter    OSTEOPOROSIS 04/30/2007   Stroke (HCC)    Wears glasses    Discharge Diagnoses:   Principal Problem:   Primary osteoarthritis of left knee  Estimated body mass index is 24.64 kg/m as calculated from the following:   Height as of this encounter:  (1.6 m).   Weight as of this encounter: 63.1 kg.  Procedure:  Procedure(s) (LRB): TOTAL KNEE ARTHROPLASTY (Left)   Consults: None  HPI: Ariel Lambert is a 86 y.o. year old female with end stage OA of her left knee with progressively worsening pain and dysfunction. She has constant pain, with activity and at rest and significant functional deficits with difficulties even with ADLs. She has had extensive non-op management including analgesics, injections of cortisone and viscosupplements, and home exercise program, but remains in significant pain with significant dysfunction. Radiographs show bone on bone arthritis lateral and patellofemoral. She presents now for left Total Knee Arthroplasty.     Laboratory Data: Admission on 09/30/2021, Discharged on 10/09/2021  Component Date Value Ref Range Status   WBC 10/01/2021 7.7  4.0 - 10.5 K/uL Final   RBC 10/01/2021 3.44 (L)  3.87 - 5.11 MIL/uL Final   Hemoglobin 10/01/2021 11.4 (L)  12.0 - 15.0 g/dL Final   HCT 40/98/1191 35.5 (L)  36.0 -  46.0 % Final   MCV 10/01/2021 103.2 (H)  80.0 - 100.0 fL Final   MCH 10/01/2021 33.1  26.0 - 34.0 pg Final   MCHC 10/01/2021 32.1  30.0 - 36.0 g/dL Final   RDW 47/82/9562 13.9  11.5 - 15.5 % Final   Platelets 10/01/2021 192  150 - 400 K/uL Final   nRBC 10/01/2021 0.0  0.0 - 0.2 % Final   Performed at [facility], [address], [place], [place] [address]   Sodium 10/01/2021 137  135 - 145 mmol/L Final   Potassium 10/01/2021 4.4  3.5 - 5.1 mmol/L Final   Chloride 10/01/2021 105  98 - 111 mmol/L Final   CO2 10/01/2021 26  22 - 32 mmol/L Final   Glucose, Bld 10/01/2021 162 (H)  70 - 99 mg/dL Final   Glucose reference range applies only to samples taken after fasting for at least 8 hours.   BUN 10/01/2021 14  8 - 23 mg/dL Final   Creatinine, Ser 10/01/2021 0.62  0.44 - 1.00 mg/dL Final   Calcium 57/84/6962 8.9  8.9 - 10.3 mg/dL Final   GFR, Estimated 10/01/2021 >60  >60 mL/min Final   Comment: (NOTE) Calculated using the CKD-EPI Creatinine Equation (2021)    Anion gap 10/01/2021 6  5 - 15 Final   Performed at [facility], [address] [place], [place] [address]   WBC 10/02/2021 9.1  4.0 - 10.5  K/uL Final   RBC 10/02/2021 3.05 (L)  3.87 - 5.11 MIL/uL Final   Hemoglobin 10/02/2021 10.0 (L)  12.0 - 15.0 g/dL Final   HCT 10/02/2021 31.4 (L)  36.0 - 46.0 % Final   MCV 10/02/2021 103.0 (H)  80.0 - 100.0 fL Final   MCH 10/02/2021 32.8  26.0 - 34.0 pg Final   MCHC 10/02/2021 31.8  30.0 - 36.0 g/dL Final   RDW 10/02/2021 14.2  11.5 - 15.5 % Final   Platelets 10/02/2021 178  150 - 400 K/uL Final   nRBC 10/02/2021 0.0  0.0 - 0.2 % Final   Performed at [facility], [place] [address], [place], [place] [address]   Sodium 10/02/2021 137  135 - 145 mmol/L Final   Potassium 10/02/2021 4.4  3.5 - 5.1 mmol/L Final   Chloride 10/02/2021 104  98 - 111 mmol/L Final   CO2 10/02/2021 29  22 - 32 mmol/L Final   Glucose, Bld 10/02/2021 100 (H)  70  - 99 mg/dL Final   Glucose reference range applies only to samples taken after fasting for at least 8 hours.   BUN 10/02/2021 13  8 - 23 mg/dL Final   Creatinine, Ser 10/02/2021 0.61  0.44 - 1.00 mg/dL Final   Calcium 10/02/2021 8.8 (L)  8.9 - 10.3 mg/dL Final   GFR, Estimated 10/02/2021 >60  >60 mL/min Final   Comment: (NOTE) Calculated using the CKD-EPI Creatinine Equation (2021)    Anion gap 10/02/2021 4 (L)  5 - 15 Final   Performed at [facility], [place] [address], [place], [place] [address]   WBC 10/03/2021 8.4  4.0 - 10.5 K/uL Final   RBC 10/03/2021 3.16 (L)  3.87 - 5.11 MIL/uL Final   Hemoglobin 10/03/2021 10.5 (L)  12.0 - 15.0 g/dL Final   HCT 10/03/2021 32.4 (L)  36.0 - 46.0 % Final   MCV 10/03/2021 102.5 (H)  80.0 - 100.0 fL Final   MCH 10/03/2021 33.2  26.0 - 34.0 pg Final   MCHC 10/03/2021 32.4  30.0 - 36.0 g/dL Final   RDW 10/03/2021 14.4  11.5 - 15.5 % Final   Platelets 10/03/2021 186  150 - 400 K/uL Final   nRBC 10/03/2021 0.0  0.0 - 0.2 % Final   Performed at [facility], [place] [address] [place], [place] [address]   WBC 10/06/2021 8.9  4.0 - 10.5 K/uL Final   RBC 10/06/2021 2.54 (L)  3.87 - 5.11 MIL/uL Final   Hemoglobin 10/06/2021 8.2 (L)  12.0 - 15.0 g/dL Final   HCT 10/06/2021 25.4 (L)  36.0 - 46.0 % Final   MCV 10/06/2021 100.0  80.0 - 100.0 fL Final   MCH 10/06/2021 32.3  26.0 - 34.0 pg Final   MCHC 10/06/2021 32.3  30.0 - 36.0 g/dL Final   RDW 10/06/2021 13.9  11.5 - 15.5 % Final   Platelets 10/06/2021 237  150 - 400 K/uL Final   nRBC 10/06/2021 0.0  0.0 - 0.2 % Final   Performed at [facility], [place] [address] [place], [place] [address]   Sodium 10/06/2021 129 (L)  135 - 145 mmol/L Final   Potassium 10/06/2021 3.6  3.5 - 5.1 mmol/L Final   Chloride 10/06/2021 95 (L)  98 - 111 mmol/L Final   CO2 10/06/2021 27  22 - 32 mmol/L Final   Glucose, Bld 10/06/2021 95  70 - 99 mg/dL Final   Glucose reference  range  applies only to samples taken after fasting for at least 8 hours.   BUN 10/06/2021 13  8 - 23 mg/dL Final   Creatinine, Ser 10/06/2021 0.49  0.44 - 1.00 mg/dL Final   Calcium 10/06/2021 8.0 (L)  8.9 - 10.3 mg/dL Final   Total Protein 10/06/2021 5.6 (L)  6.5 - 8.1 g/dL Final   Albumin 10/06/2021 2.4 (L)  3.5 - 5.0 g/dL Final   AST 10/06/2021 32  15 - 41 U/L Final   ALT 10/06/2021 37  0 - 44 U/L Final   Alkaline Phosphatase 10/06/2021 46  38 - 126 U/L Final   Total Bilirubin 10/06/2021 1.2  0.3 - 1.2 mg/dL Final   GFR, Estimated 10/06/2021 >60  >60 mL/min Final   Comment: (NOTE) Calculated using the CKD-EPI Creatinine Equation (2021)    Anion gap 10/06/2021 7  5 - 15 Final   Performed at [facility], [place] [address] [place], [place] [address]   Color, Urine 10/06/2021 YELLOW  YELLOW Final   APPearance 10/06/2021 HAZY (A)  CLEAR Final   Specific Gravity, Urine 10/06/2021 1.010  1.005 - 1.030 Final   pH 10/06/2021 6.0  5.0 - 8.0 Final   Glucose, UA 10/06/2021 NEGATIVE  NEGATIVE mg/dL Final   Hgb urine dipstick 10/06/2021 MODERATE (A)  NEGATIVE Final   Bilirubin Urine 10/06/2021 NEGATIVE  NEGATIVE Final   Ketones, ur 10/06/2021 NEGATIVE  NEGATIVE mg/dL Final   Protein, ur 10/06/2021 NEGATIVE  NEGATIVE mg/dL Final   Nitrite 10/06/2021 POSITIVE (A)  NEGATIVE Final   Leukocytes,Ua 10/06/2021 LARGE (A)  NEGATIVE Final   RBC / HPF 10/06/2021 6-10  0 - 5 RBC/hpf Final   WBC, UA 10/06/2021 >50 (H)  0 - 5 WBC/hpf Final   Bacteria, UA 10/06/2021 RARE (A)  NONE SEEN Final   Squamous Epithelial / LPF 10/06/2021 0-5  0 - 5 Final   Mucus 10/06/2021 PRESENT   Final   Performed at [facility], [place] [address] [place], [place] [address]   WBC 10/07/2021 7.7  4.0 - 10.5 K/uL Final   RBC 10/07/2021 2.59 (L)  3.87 - 5.11 MIL/uL Final   Hemoglobin 10/07/2021 8.4 (L)  12.0 - 15.0 g/dL Final   HCT 10/07/2021 26.4 (L)  36.0 - 46.0 % Final   MCV 10/07/2021 101.9  (H)  80.0 - 100.0 fL Final   MCH 10/07/2021 32.4  26.0 - 34.0 pg Final   MCHC 10/07/2021 31.8  30.0 - 36.0 g/dL Final   RDW 10/07/2021 14.0  11.5 - 15.5 % Final   Platelets 10/07/2021 218  150 - 400 K/uL Final   nRBC 10/07/2021 0.0  0.0 - 0.2 % Final   Performed at [facility], [place] [address] [place], [place] [address]   Sodium 10/07/2021 129 (L)  135 - 145 mmol/L Final   Potassium 10/07/2021 3.6  3.5 - 5.1 mmol/L Final   Chloride 10/07/2021 98  98 - 111 mmol/L Final   CO2 10/07/2021 26  22 - 32 mmol/L Final   Glucose, Bld 10/07/2021 106 (H)  70 - 99 mg/dL Final   Glucose reference range applies only to samples taken after fasting for at least 8 hours.   BUN 10/07/2021 13  8 - 23 mg/dL Final   Creatinine, Ser 10/07/2021 0.51  0.44 - 1.00 mg/dL Final   Calcium 10/07/2021 8.0 (L)  8.9 - 10.3 mg/dL Final   GFR, Estimated 10/07/2021 >60  >60 mL/min Final   Comment: (  NOTE) Calculated using the CKD-EPI Creatinine Equation (2021)    Anion gap 10/07/2021 5  5 - 15 Final   Performed at [facility], [address], [place], [place] [address]   SARS Coronavirus 2 10/07/2021 NEGATIVE  NEGATIVE Final   Comment: (NOTE) SARS-CoV-2 target nucleic acids are NOT DETECTED.  The SARS-CoV-2 RNA is generally detectable in upper and lower respiratory specimens during the acute phase of infection. Negative results do not preclude SARS-CoV-2 infection, do not rule out co-infections with other pathogens, and should not be used as the sole basis for treatment or other patient management decisions. Negative results must be combined with clinical observations, patient history, and epidemiological information. The expected result is Negative.  Fact Sheet for Patients: HairSlick.no  Fact Sheet for Healthcare Providers: quierodirigir.com  This test is not yet approved or cleared by the [place] FDA and  has  been authorized for detection and/or diagnosis of SARS-CoV-2 by FDA under an Emergency Use Authorization (EUA). This EUA will remain  in effect (meaning this test can be used) for the duration of the COVID-19 declaration under Se                          ction 564(b)(1) of the Act, 21 U.S.C. section 360bbb-3(b)(1), unless the authorization is terminated or revoked sooner.  Performed at [facility], [address] [place], [place] [address]   Hospital Outpatient Visit on 09/26/2021  Component Date Value Ref Range Status   SARS Coronavirus 2 09/25/2021 NEGATIVE  NEGATIVE Final   Comment: (NOTE) SARS-CoV-2 target nucleic acids are NOT DETECTED.  The SARS-CoV-2 RNA is generally detectable in upper and lower respiratory specimens during the acute phase of infection. Negative results do not preclude SARS-CoV-2 infection, do not rule out co-infections with other pathogens, and should not be used as the sole basis for treatment or other patient management decisions. Negative results must be combined with clinical observations, patient history, and epidemiological information. The expected result is Negative.  Fact Sheet for Patients: HairSlick.no  Fact Sheet for Healthcare Providers: quierodirigir.com  This test is not yet approved or cleared by the [place] FDA and  has been authorized for detection and/or diagnosis of SARS-CoV-2 by FDA under an Emergency Use Authorization (EUA). This EUA will remain  in effect (meaning this test can be used) for the duration of the COVID-19 declaration under Se                          ction 564(b)(1) of the Act, 21 U.S.C. section 360bbb-3(b)(1), unless the authorization is terminated or revoked sooner.  Performed at [facility], [address] [place], [place] [address]   Hospital Outpatient Visit on 09/18/2021  Component Date Value Ref Range Status   MRSA, PCR  09/18/2021 NEGATIVE  NEGATIVE Final   Staphylococcus aureus 09/18/2021 NEGATIVE  NEGATIVE Final   Comment: (NOTE) The Xpert SA Assay (FDA approved for NASAL specimens in patients 69 years of age and older), is one component of a comprehensive surveillance program. It is not intended to diagnose infection nor to guide or monitor treatment. Performed at [facility], [address], [place], [place] [address]    WBC 09/18/2021 8.4  4.0 - 10.5 K/uL Final   RBC 09/18/2021 4.17  3.87 - 5.11 MIL/uL Final   Hemoglobin 09/18/2021 13.7  12.0 - 15.0 g/dL Final  HCT 09/18/2021 42.1  36.0 - 46.0 % Final   MCV 09/18/2021 101.0 (H)  80.0 - 100.0 fL Final   MCH 09/18/2021 32.9  26.0 - 34.0 pg Final   MCHC 09/18/2021 32.5  30.0 - 36.0 g/dL Final   RDW 09/18/2021 13.8  11.5 - 15.5 % Final   Platelets 09/18/2021 228  150 - 400 K/uL Final   nRBC 09/18/2021 0.0  0.0 - 0.2 % Final   Performed at [facility], [place] [address], [place], [place] [address]   Sodium 09/18/2021 137  135 - 145 mmol/L Final   Potassium 09/18/2021 3.6  3.5 - 5.1 mmol/L Final   Chloride 09/18/2021 100  98 - 111 mmol/L Final   CO2 09/18/2021 28  22 - 32 mmol/L Final   Glucose, Bld 09/18/2021 90  70 - 99 mg/dL Final   Glucose reference range applies only to samples taken after fasting for at least 8 hours.   BUN 09/18/2021 19  8 - 23 mg/dL Final   Creatinine, Ser 09/18/2021 0.62  0.44 - 1.00 mg/dL Final   Calcium 09/18/2021 9.0  8.9 - 10.3 mg/dL Final   Total Protein 09/18/2021 7.2  6.5 - 8.1 g/dL Final   Albumin 09/18/2021 3.7  3.5 - 5.0 g/dL Final   AST 09/18/2021 14 (L)  15 - 41 U/L Final   ALT 09/18/2021 18  0 - 44 U/L Final   Alkaline Phosphatase 09/18/2021 76  38 - 126 U/L Final   Total Bilirubin 09/18/2021 1.2  0.3 - 1.2 mg/dL Final   GFR, Estimated 09/18/2021 >60  >60 mL/min Final   Comment: (NOTE) Calculated using the CKD-EPI Creatinine Equation (2021)    Anion gap 09/18/2021 9   5 - 15 Final   Performed at [facility], [place] [address], [place], [place] [address]   ABO/RH(D) 09/18/2021 O POS   Final   Antibody Screen 09/18/2021 NEG   Final   Sample Expiration 09/18/2021 10/02/2021,2359   Final   Extend sample reason 09/18/2021    Final                   Value:NO TRANSFUSIONS OR PREGNANCY IN THE PAST 3 MONTHS Performed at [place] [address] [place], [place] [address]    Prothrombin Time 09/18/2021 13.6  11.4 - 15.2 seconds Final   INR 09/18/2021 1.0  0.8 - 1.2 Final   Comment: (NOTE) INR goal varies based on device and disease states. Performed at [facility], [place] [address] [place], [place] [address]      X-Rays:CT HEAD WO CONTRAST (5MM)  Result Date: 10/06/2021 CLINICAL DATA:  Mental status changes. EXAM: CT HEAD WITHOUT CONTRAST TECHNIQUE: Contiguous axial images were obtained from the base of the skull through the vertex without intravenous contrast. RADIATION DOSE REDUCTION: This exam was performed according to the departmental dose-optimization program which includes automated exposure control, adjustment of the mA and/or kV according to patient size and/or use of iterative reconstruction technique. COMPARISON:  03/21/2008 FINDINGS: Brain: Left MCA territory infarct is similar to prior. There is no evidence for acute hemorrhage, hydrocephalus, mass lesion, or abnormal extra-axial fluid collection. No definite CT evidence for acute infarction. Diffuse loss of parenchymal volume is consistent with atrophy. Patchy low attenuation in the deep hemispheric and periventricular white matter is nonspecific, but likely reflects chronic microvascular ischemic demyelination. Vascular: No hyperdense vessel or unexpected calcification. Skull: No evidence for fracture. No worrisome lytic or  sclerotic lesion. Sinuses/Orbits: No acute finding. Other: None. IMPRESSION: 1. No acute intracranial abnormality. 2. Old  left MCA territory infarct. 3. Atrophy with chronic small vessel ischemic disease. Electronically Signed   By: Kennith CenterEric  Mansell M.D.   On: 10/06/2021 15:28   DG Chest Port 1 View  Result Date: 10/07/2021 CLINICAL DATA:  Hypoxia EXAM: PORTABLE CHEST 1 VIEW COMPARISON:  Chest x-ray 09/20/2017 FINDINGS: Heart size and mediastinal contours are within normal limits. No suspicious pulmonary opacities identified. No pleural effusion or pneumothorax visualized. Surgical clips in the right neck. No acute osseous abnormality appreciated. IMPRESSION: No acute intrathoracic process identified. Electronically Signed   By: Jannifer Hickelaney  Williams M.D.   On: 10/07/2021 11:24    EKG: Orders placed or performed during the hospital encounter of 09/30/21   EKG 12-Lead   EKG 12-Lead   EKG     Hospital Course: Ariel Lambert is a 86 y.o. who was [facility] to [facility]. They were brought to the operating room on 09/30/2021 and underwent Procedure(s): TOTAL KNEE ARTHROPLASTY.  Patient tolerated the procedure well and was later transferred to the recovery room and then to the orthopaedic floor for postoperative care. They were given PO and IV analgesics for pain control following their surgery. They were given 24 hours of postoperative antibiotics of  Anti-infectives (From admission, onward)    Start     Dose/Rate Route Frequency Ordered Stop   10/06/21 1515  cefTRIAXone (ROCEPHIN) 1 g in sodium chloride 0.9 % 100 mL IVPB  Status:  Discontinued        1 g 200 mL/hr over 30 Minutes Intravenous Every 24 hours 10/06/21 1421 10/09/21 2217   09/30/21 2000  ceFAZolin (ANCEF) IVPB 2g/100 mL premix        2 g 200 mL/hr over 30 Minutes Intravenous Every 6 hours 09/30/21 1741 10/01/21 0200   09/30/21 1130  ceFAZolin (ANCEF) IVPB 2g/100 mL premix        2 g 200 mL/hr over 30 Minutes Intravenous On call to O.R. 09/30/21 1125 09/30/21 1417      and started on DVT prophylaxis in the form of Xarelto.   PT and OT were  ordered for total joint protocol. Discharge planning consulted to help with postop disposition and equipment needs. Patient had a decent night on the evening of surgery. They started to get up OOB with therapy on POD #1. Patient had very slow progression with mobility resulting in a longer hospital stay. Appetite was significantly reduced, a dietary and hospitalist consult was obtained. Patient slowly improved in terms of mobility and began feeling better overall. She required nine days postoperatively in the hospital, no significant medical issues arose during this time. She turned a corner and began to meet her goals with PT on day nine, and was discharged home with her daughter in stable condition.   Diet: Regular diet Activity: WBAT Follow-up: in 2 weeks Disposition: Home with HHPT Discharged Condition: stable   Discharge Instructions     Call MD / Call 911   Complete by: As directed    If you experience chest pain or shortness of breath, CALL 911 and be transported to the hospital emergency room.  If you develope a fever above 101 F, pus (white drainage) or increased drainage or redness at the wound, or calf pain, call your surgeon's office.   Call MD / Call 911   Complete by: As directed    If you experience chest pain or shortness of breath,  CALL 911 and be transported to the hospital emergency room.  If you develope a fever above 101 F, pus (white drainage) or increased drainage or redness at the wound, or calf pain, call your surgeon's office.   Change dressing   Complete by: As directed    You may remove the bulky bandage (ACE wrap and gauze) two days after surgery. You will have an adhesive waterproof bandage underneath. Leave this in place until your first follow-up appointment.   Change dressing   Complete by: As directed    You may remove the bulky bandage (ACE wrap and gauze) two days after surgery. You will have an adhesive waterproof bandage underneath. Leave this in place  until your first follow-up appointment.   Constipation Prevention   Complete by: As directed    Drink plenty of fluids.  Prune juice may be helpful.  You may use a stool softener, such as Colace (over the counter) 100 mg twice a day.  Use MiraLax (over the counter) for constipation as needed.   Constipation Prevention   Complete by: As directed    Drink plenty of fluids.  Prune juice may be helpful.  You may use a stool softener, such as Colace (over the counter) 100 mg twice a day.  Use MiraLax (over the counter) for constipation as needed.   Diet - low sodium heart healthy   Complete by: As directed    Diet - low sodium heart healthy   Complete by: As directed    Do not put a pillow under the knee. Place it under the heel.   Complete by: As directed    Do not put a pillow under the knee. Place it under the heel.   Complete by: As directed    Driving restrictions   Complete by: As directed    No driving for two weeks   Driving restrictions   Complete by: As directed    No driving for two weeks   Post-operative opioid taper instructions:   Complete by: As directed    POST-OPERATIVE OPIOID TAPER INSTRUCTIONS: It is important to wean off of your opioid medication as soon as possible. If you do not need pain medication after your surgery it is ok to stop day one. Opioids include: Codeine, Hydrocodone(Norco, Vicodin), Oxycodone(Percocet, oxycontin) and hydromorphone amongst others.  Long term and even Lambert term use of opiods can cause: Increased pain response Dependence Constipation Depression Respiratory depression And more.  Withdrawal symptoms can include Flu like symptoms Nausea, vomiting And more Techniques to manage these symptoms Hydrate well Eat regular healthy meals Stay active Use relaxation techniques(deep breathing, meditating, yoga) Do Not substitute Alcohol to help with tapering If you have been on opioids for less than two weeks and do not have pain than it is  ok to stop all together.  Plan to wean off of opioids This plan should start within one week post op of your joint replacement. Maintain the same interval or time between taking each dose and first decrease the dose.  Cut the total daily intake of opioids by one tablet each day Next start to increase the time between doses. The last dose that should be eliminated is the evening dose.      Post-operative opioid taper instructions:   Complete by: As directed    POST-OPERATIVE OPIOID TAPER INSTRUCTIONS: It is important to wean off of your opioid medication as soon as possible. If you do not need pain medication after your surgery it is  ok to stop day one. Opioids include: Codeine, Hydrocodone(Norco, Vicodin), Oxycodone(Percocet, oxycontin) and hydromorphone amongst others.  Long term and even Lambert term use of opiods can cause: Increased pain response Dependence Constipation Depression Respiratory depression And more.  Withdrawal symptoms can include Flu like symptoms Nausea, vomiting And more Techniques to manage these symptoms Hydrate well Eat regular healthy meals Stay active Use relaxation techniques(deep breathing, meditating, yoga) Do Not substitute Alcohol to help with tapering If you have been on opioids for less than two weeks and do not have pain than it is ok to stop all together.  Plan to wean off of opioids This plan should start within one week post op of your joint replacement. Maintain the same interval or time between taking each dose and first decrease the dose.  Cut the total daily intake of opioids by one tablet each day Next start to increase the time between doses. The last dose that should be eliminated is the evening dose.      TED hose   Complete by: As directed    Use stockings (TED hose) for three weeks on both leg(s).  You may remove them at night for sleeping.   TED hose   Complete by: As directed    Use stockings (TED hose) for three weeks on  both leg(s).  You may remove them at night for sleeping.   Weight bearing as tolerated   Complete by: As directed    Weight bearing as tolerated   Complete by: As directed       Allergies as of 10/09/2021       Reactions   Cetirizine Other (See Comments)   Stomach pain   Cetirizine & Related Other (See Comments)   Stomach pains   Codeine Itching   Severe itching   Meperidine Diarrhea, Nausea And Vomiting   Meperidine Hcl Diarrhea, Nausea And Vomiting   (Demerol)   Nabumetone Rash   (Relafen)   Naproxen Itching, Rash   Sulfa Antibiotics Rash   Sulfonamide Derivatives Rash        Medication List     STOP taking these medications    aspirin 81 MG EC tablet   b complex vitamins tablet   Biotin 5000 MCG Caps   Calcium Carbonate 500 (200 Ca) MG Wafr   magnesium gluconate 500 MG tablet Commonly known as: MAGONATE   multivitamin with minerals Tabs tablet   Vitamin D3 25 MCG tablet Commonly known as: Vitamin D       TAKE these medications    albuterol 108 (90 Base) MCG/ACT inhaler Commonly known as: VENTOLIN HFA Inhale 2 puffs into the lungs every 6 (six) hours as needed. What changed: reasons to take this   alendronate 70 MG tablet Commonly known as: FOSAMAX Take 70 mg by mouth every Friday. Take with a full glass of water on an empty stomach.   azelastine 0.1 % nasal spray Commonly known as: ASTELIN Place 1 spray into both nostrils at bedtime. Use in each nostril as directed   desloratadine 5 MG tablet Commonly known as: CLARINEX Take 5 mg by mouth at bedtime.   diphenhydrAMINE 25 MG tablet Commonly known as: BENADRYL 25 mg daily as needed for allergies.   diphenhydrAMINE 25 MG tablet Commonly known as: SOMINEX Take 25 mg by mouth at bedtime as needed for sleep.   ezetimibe-simvastatin 10-20 MG tablet Commonly known as: VYTORIN Take 1 tablet by mouth daily after breakfast.   fexofenadine 180 MG tablet Commonly known as:  ALLEGRA Take 180 mg  by mouth daily.   ipratropium 0.03 % nasal spray Commonly known as: ATROVENT Place 2 sprays into both nostrils daily.   levalbuterol 0.63 MG/3ML nebulizer solution Commonly known as: XOPENEX Take 3 mLs (0.63 mg total) by nebulization every 4 (four) hours. What changed:  when to take this reasons to take this   methimazole 10 MG tablet Commonly known as: TAPAZOLE TAKE 1 TABLET BY MOUTH EVERY DAY What changed:  how much to take when to take this additional instructions   methocarbamol 500 MG tablet Commonly known as: ROBAXIN Take 1 tablet (500 mg total) by mouth every 6 (six) hours as needed for muscle spasms.   montelukast 10 MG tablet Commonly known as: SINGULAIR Take 10 mg by mouth at bedtime.   rivaroxaban 10 MG Tabs tablet Commonly known as: XARELTO Take 1 tablet (10 mg total) by mouth daily with breakfast for 19 days. Then resume one 81 mg aspirin once a day.   Symbicort 160-4.5 MCG/ACT inhaler Generic drug: budesonide-formoterol Inhale 2 puffs into the lungs 2 (two) times daily.   valsartan-hydrochlorothiazide 320-12.5 MG tablet Commonly known as: DIOVAN-HCT Take 1 tablet by mouth daily.               Discharge Care Instructions  (From admission, onward)           Start     Ordered   10/03/21 0000  Weight bearing as tolerated        10/03/21 0735   10/03/21 0000  Change dressing       Comments: You may remove the bulky bandage (ACE wrap and gauze) two days after surgery. You will have an adhesive waterproof bandage underneath. Leave this in place until your first follow-up appointment.   10/03/21 0735   10/02/21 0000  Weight bearing as tolerated        10/02/21 0928   10/02/21 0000  Change dressing       Comments: You may remove the bulky bandage (ACE wrap and gauze) two days after surgery. You will have an adhesive waterproof bandage underneath. Leave this in place until your first follow-up appointment.   10/02/21 [id number]             Follow-up Information     Gaynelle Arabian, MD. Go on 10/15/2021.   Specialty: Orthopedic Surgery Why: You are scheduled for a follow up appointment on 10-15-21 at 1:15 pm. Contact information: [address] [place] [address] [id number]         [facility] [place]. Call.   Why: [facility] will provide PT in the home. Call [phone] if you do not hear from anyone within 24-48 hours of discharge.                Signed: Theresa Duty, PA-C Orthopedic Surgery 10/16/2021, 9:55 AM

## 2021-10-17 ENCOUNTER — Emergency Department (HOSPITAL_BASED_OUTPATIENT_CLINIC_OR_DEPARTMENT_OTHER)
Admission: EM | Admit: 2021-10-17 | Discharge: 2021-10-17 | Disposition: A | Discharge status: Home / Self Care | Payer: Medicare PPO | Attending: Emergency Medicine | Admitting: Emergency Medicine

## 2021-10-17 ENCOUNTER — Other Ambulatory Visit: Payer: Self-pay

## 2021-10-17 ENCOUNTER — Encounter (HOSPITAL_BASED_OUTPATIENT_CLINIC_OR_DEPARTMENT_OTHER): Payer: Self-pay | Admitting: Emergency Medicine

## 2021-10-17 DIAGNOSIS — R339 Retention of urine, unspecified: Secondary | ICD-10-CM | POA: Diagnosis not present

## 2021-10-17 DIAGNOSIS — R531 Weakness: Secondary | ICD-10-CM | POA: Insufficient documentation

## 2021-10-17 LAB — URINALYSIS, ROUTINE W REFLEX MICROSCOPIC
Bilirubin Urine: NEGATIVE
Glucose, UA: NEGATIVE mg/dL
Hgb urine dipstick: NEGATIVE
Ketones, ur: NEGATIVE mg/dL
Leukocytes,Ua: NEGATIVE
Nitrite: NEGATIVE
Protein, ur: NEGATIVE mg/dL
Specific Gravity, Urine: 1.007 (ref 1.005–1.030)
pH: 7 (ref 5.0–8.0)

## 2021-10-17 LAB — CBC
HCT: 31.5 % — ABNORMAL LOW (ref 36.0–46.0)
Hemoglobin: 10.1 g/dL — ABNORMAL LOW (ref 12.0–15.0)
MCH: 31.3 pg (ref 26.0–34.0)
MCHC: 32.1 g/dL (ref 30.0–36.0)
MCV: 97.5 fL (ref 80.0–100.0)
Platelets: 478 10*3/uL — ABNORMAL HIGH (ref 150–400)
RBC: 3.23 MIL/uL — ABNORMAL LOW (ref 3.87–5.11)
RDW: 14.5 % (ref 11.5–15.5)
WBC: 6.7 10*3/uL (ref 4.0–10.5)
nRBC: 0 % (ref 0.0–0.2)

## 2021-10-17 LAB — BASIC METABOLIC PANEL
Anion gap: 9 (ref 5–15)
BUN: 12 mg/dL (ref 8–23)
CO2: 26 mmol/L (ref 22–32)
Calcium: 9.1 mg/dL (ref 8.9–10.3)
Chloride: 98 mmol/L (ref 98–111)
Creatinine, Ser: 0.55 mg/dL (ref 0.44–1.00)
GFR, Estimated: >60 mL/min (ref >60)
Glucose, Bld: 91 mg/dL (ref 70–99)
Potassium: 3.8 mmol/L (ref 3.5–5.1)
Sodium: 133 mmol/L — ABNORMAL LOW (ref 135–145)

## 2021-10-17 LAB — TSH: TSH: 1.294 u[IU]/mL (ref 0.350–4.500)

## 2021-10-17 LAB — TROPONIN I (HIGH SENSITIVITY): Troponin I (High Sensitivity): 9 ng/L (ref <18)

## 2021-10-17 NOTE — ED Triage Notes (Signed)
Unable to urinate since lunchtime yesterday. She had knee surgery and a UTI 2 weeks ago. Also c/o generalized weakness and nausea.

## 2021-10-17 NOTE — ED Provider Notes (Signed)
[facility] EMERGENCY DEPT Provider Note   CSN: [id number] Arrival date & time: 10/17/21  1027     History  Chief Complaint  Patient presents with   Urinary Retention   Weakness    Ariel Lambert is a 86 y.o. female.  Pt is a 86 yo female presenting for decreased urine production x 1.5 days. Pt admits to increased oral intake with no urine production. Denies abdominal pain, nausea, vomiting, diarrhea, fever, or any other dehydrating factors. No previous hx of renal failure. Recent hx of UTI discovered while admitted in the hospital for postop complications. No symptoms at that time. Completed antibiotic course.   The history is provided by the patient. No language interpreter was used.  Weakness Associated symptoms: no abdominal pain, no arthralgias, no chest pain, no cough, no dysuria, no fever, no seizures, no shortness of breath and no vomiting       Home Medications Prior to Admission medications   Medication Sig Start Date End Date Taking? Authorizing Provider  albuterol (PROVENTIL HFA;VENTOLIN HFA) 108 (90 BASE) MCG/ACT inhaler Inhale 2 puffs into the lungs every 6 (six) hours as needed. Patient taking differently: Inhale 2 puffs into the lungs every 6 (six) hours as needed for wheezing or shortness of breath (Asthma). 04/20/14   Collene Gobble, MD  alendronate (FOSAMAX) 70 MG tablet Take 70 mg by mouth every Friday. Take with a full glass of water on an empty stomach.    [provider]  azelastine (ASTELIN) 0.1 % nasal spray Place 1 spray into both nostrils at bedtime. Use in each nostril as directed    [provider]  desloratadine (CLARINEX) 5 MG tablet Take 5 mg by mouth at bedtime. 08/21/21   [provider]  diphenhydrAMINE (BENADRYL) 25 MG tablet 25 mg daily as needed for allergies.    [provider]  diphenhydrAMINE (SOMINEX) 25 MG tablet Take 25 mg by mouth at bedtime as needed for sleep.    [provider]  ezetimibe-simvastatin (VYTORIN) 10-20 MG per tablet Take 1 tablet by mouth daily after breakfast.    [provider]  fexofenadine (ALLEGRA) 180 MG tablet Take 180 mg by mouth daily.    [provider]  ipratropium (ATROVENT) 0.03 % nasal spray Place 2 sprays into both nostrils daily.    [provider]  levalbuterol Penne Lash) 0.63 MG/3ML nebulizer solution Take 3 mLs (0.63 mg total) by nebulization every 4 (four) hours. Patient taking differently: Take 0.63 mg by nebulization every 6 (six) hours as needed for wheezing or shortness of breath. 09/07/15   Nita Sells, MD  methimazole (TAPAZOLE) 10 MG tablet TAKE 1 TABLET BY MOUTH EVERY DAY Patient taking differently: Take 5-10 mg by mouth See admin instructions. Take 10 mg one day 5 mg the next day alternate every other day 07/25/21   Renato Shin, MD  methocarbamol (ROBAXIN) 500 MG tablet Take 1 tablet (500 mg total) by mouth every 6 (six) hours as needed for muscle spasms. 10/02/21   Edmisten, Kristie L, PA  montelukast (SINGULAIR) 10 MG tablet Take 10 mg by mouth at bedtime.    [provider]  rivaroxaban (XARELTO) 10 MG TABS tablet Take 1 tablet (10 mg total) by mouth daily with breakfast for 19 days. Then resume one 81 mg aspirin once a day. 10/03/21 10/22/21  Edmisten, Ok Anis, PA  SYMBICORT 160-4.5 MCG/ACT inhaler Inhale 2 puffs into the lungs 2 (two) times daily. 07/09/15   [provider]  valsartan-hydrochlorothiazide (DIOVAN-HCT) 320-12.5 MG tablet Take 1 tablet by mouth daily. 07/20/21   [provider]      Allergies    Cetirizine, Cetirizine & related, Codeine, Meperidine, Meperidine hcl, Nabumetone, Naproxen, Sulfa antibiotics, and Sulfonamide derivatives    Review of Systems   Review of Systems  Constitutional:  Negative for chills and fever.  HENT:  Negative for ear pain and sore throat.   Eyes:  Negative for pain and visual disturbance.  Respiratory:   Negative for cough and shortness of breath.   Cardiovascular:  Negative for chest pain and palpitations.  Gastrointestinal:  Negative for abdominal pain and vomiting.  Genitourinary:  Positive for decreased urine volume. Negative for dysuria and hematuria.  Musculoskeletal:  Negative for arthralgias and back pain.  Skin:  Negative for color change and rash.  Neurological:  Positive for weakness. Negative for seizures and syncope.  All other systems reviewed and are negative.  Physical Exam Updated Vital Signs BP 111/71 (BP Location: Left Arm)    Pulse 88    Temp 97.8 F (36.6 C) (Oral)    Resp 20    Ht 5\' 3"  (1.6 m)    Wt 59 kg    SpO2 99%    BMI 23.03 kg/m  Physical Exam Vitals and nursing note reviewed.  Constitutional:      General: She is not in acute distress.    Appearance: She is well-developed.  HENT:     Head: Normocephalic and atraumatic.  Eyes:     Conjunctiva/sclera: Conjunctivae normal.  Cardiovascular:     Rate and Rhythm: Normal rate and regular rhythm.     Heart sounds: No murmur heard. Pulmonary:     Effort: Pulmonary effort is normal. No respiratory distress.     Breath sounds: Normal breath sounds.  Abdominal:     Palpations: Abdomen is soft.     Tenderness: There is no abdominal tenderness.  Musculoskeletal:        General: No swelling.     Cervical back: Neck supple.       Legs:  Skin:    General: Skin is warm and dry.     Capillary Refill: Capillary refill takes less than 2 seconds.  Neurological:     Mental Status: She is alert and oriented to person, place, and time.     GCS: GCS eye subscore is 4. GCS verbal subscore is 5. GCS motor subscore is 6.  Psychiatric:        Mood and Affect: Mood normal.    ED Results / Procedures / Treatments   Labs (all labs ordered are listed, but only abnormal results are displayed) Labs Reviewed  URINALYSIS, [place] PANEL  CBC    EKG None  Radiology No results  found.  Procedures Procedures    Medications Ordered in ED Medications - No data to display  ED Course/ Medical Decision Making/ A&P                           Medical Decision Making Amount and/or Complexity of Data Reviewed Labs: ordered.   11:24 AM 86 yo female presenting for decreased urine production x 1.5 days. Pt is Aoxx3, no acute distress, afebrile, with stable vitals. Abdomen is soft and on tender. Pt urinated 200 ml while in ED. Post void bladder scan demonstrates 40 ml. UA demonstrates no UTI. Patient urinated a second time prior to DC.  Pt also  worked up for generalized weakness today resulting in difficulty ambulating. ECG stable with no ST segment elevation or depression. Stable troponin and electrolytes.  PE considered due to recent surgery but low concern due to no SOB, CP, tachycardia, or hypoxia. Recent left LE looks well with no signs of infection or wound dehiscence. No s/s sepsis. No hx of bleeding or concerning anemia.   Patient in no distress and overall condition improved here in the ED. Findings discussed with patient and daughter at bedside. Pt was offered admission for generalized weakness for rehab but declined.   Detailed discussions were had with the patient regarding current findings, and need for close f/u with PCP or on call doctor. The patient has been instructed to return immediately if the symptoms worsen in any way for re-evaluation. Patient verbalized understanding and is in agreement with current care plan. All questions answered prior to discharge.         Final Clinical Impression(s) / ED Diagnoses Final diagnoses:  Generalized weakness    Rx / DC Orders ED Discharge Orders     None         Lianne Cure, DO [id number]

## 2021-10-29 DIAGNOSIS — M25662 Stiffness of left knee, not elsewhere classified: Secondary | ICD-10-CM | POA: Diagnosis not present

## 2021-10-29 DIAGNOSIS — M25562 Pain in left knee: Secondary | ICD-10-CM | POA: Diagnosis not present

## 2021-10-31 ENCOUNTER — Other Ambulatory Visit: Payer: Self-pay

## 2021-10-31 ENCOUNTER — Emergency Department (HOSPITAL_BASED_OUTPATIENT_CLINIC_OR_DEPARTMENT_OTHER): Payer: Medicare PPO | Admitting: Radiology

## 2021-10-31 ENCOUNTER — Emergency Department (HOSPITAL_BASED_OUTPATIENT_CLINIC_OR_DEPARTMENT_OTHER)
Admission: EM | Admit: 2021-10-31 | Discharge: 2021-10-31 | Disposition: A | Discharge status: Home / Self Care | Payer: Medicare PPO | Attending: Emergency Medicine | Admitting: Emergency Medicine

## 2021-10-31 ENCOUNTER — Encounter (HOSPITAL_BASED_OUTPATIENT_CLINIC_OR_DEPARTMENT_OTHER): Payer: Self-pay | Admitting: Radiology

## 2021-10-31 ENCOUNTER — Emergency Department (HOSPITAL_BASED_OUTPATIENT_CLINIC_OR_DEPARTMENT_OTHER): Payer: Medicare PPO

## 2021-10-31 DIAGNOSIS — R739 Hyperglycemia, unspecified: Secondary | ICD-10-CM | POA: Diagnosis not present

## 2021-10-31 DIAGNOSIS — I129 Hypertensive chronic kidney disease with stage 1 through stage 4 chronic kidney disease, or unspecified chronic kidney disease: Secondary | ICD-10-CM | POA: Diagnosis not present

## 2021-10-31 DIAGNOSIS — N390 Urinary tract infection, site not specified: Secondary | ICD-10-CM | POA: Insufficient documentation

## 2021-10-31 DIAGNOSIS — R4182 Altered mental status, unspecified: Secondary | ICD-10-CM | POA: Insufficient documentation

## 2021-10-31 DIAGNOSIS — R339 Retention of urine, unspecified: Secondary | ICD-10-CM | POA: Diagnosis not present

## 2021-10-31 DIAGNOSIS — R410 Disorientation, unspecified: Secondary | ICD-10-CM | POA: Diagnosis present

## 2021-10-31 DIAGNOSIS — Z20822 Contact with and (suspected) exposure to covid-19: Secondary | ICD-10-CM | POA: Insufficient documentation

## 2021-10-31 DIAGNOSIS — E860 Dehydration: Secondary | ICD-10-CM | POA: Diagnosis not present

## 2021-10-31 DIAGNOSIS — J45909 Unspecified asthma, uncomplicated: Secondary | ICD-10-CM | POA: Diagnosis not present

## 2021-10-31 DIAGNOSIS — J449 Chronic obstructive pulmonary disease, unspecified: Secondary | ICD-10-CM | POA: Insufficient documentation

## 2021-10-31 DIAGNOSIS — N189 Chronic kidney disease, unspecified: Secondary | ICD-10-CM | POA: Insufficient documentation

## 2021-10-31 DIAGNOSIS — I10 Essential (primary) hypertension: Secondary | ICD-10-CM | POA: Diagnosis not present

## 2021-10-31 DIAGNOSIS — R531 Weakness: Secondary | ICD-10-CM | POA: Insufficient documentation

## 2021-10-31 DIAGNOSIS — D649 Anemia, unspecified: Secondary | ICD-10-CM | POA: Diagnosis not present

## 2021-10-31 LAB — RESP PANEL BY RT-PCR (FLU A&B, COVID) ARPGX2
Influenza A by PCR: NEGATIVE
Influenza B by PCR: NEGATIVE
SARS Coronavirus 2 by RT PCR: NEGATIVE

## 2021-10-31 LAB — COMPREHENSIVE METABOLIC PANEL
ALT: 13 U/L (ref 0–44)
AST: 9 U/L — ABNORMAL LOW (ref 15–41)
Albumin: 3.6 g/dL (ref 3.5–5.0)
Alkaline Phosphatase: 63 U/L (ref 38–126)
Anion gap: 10 (ref 5–15)
BUN: 16 mg/dL (ref 8–23)
CO2: 26 mmol/L (ref 22–32)
Calcium: 9 mg/dL (ref 8.9–10.3)
Chloride: 97 mmol/L — ABNORMAL LOW (ref 98–111)
Creatinine, Ser: 0.57 mg/dL (ref 0.44–1.00)
GFR, Estimated: >60 mL/min (ref >60)
Glucose, Bld: 129 mg/dL — ABNORMAL HIGH (ref 70–99)
Potassium: 4.1 mmol/L (ref 3.5–5.1)
Sodium: 133 mmol/L — ABNORMAL LOW (ref 135–145)
Total Bilirubin: 0.6 mg/dL (ref 0.3–1.2)
Total Protein: 6.3 g/dL — ABNORMAL LOW (ref 6.5–8.1)

## 2021-10-31 LAB — CBC WITH DIFFERENTIAL/PLATELET
Abs Immature Granulocytes: 0.03 10*3/uL (ref 0.00–0.07)
Basophils Absolute: 0 10*3/uL (ref 0.0–0.1)
Basophils Relative: 1 %
Eosinophils Absolute: 0 10*3/uL (ref 0.0–0.5)
Eosinophils Relative: 0 %
HCT: 34.5 % — ABNORMAL LOW (ref 36.0–46.0)
Hemoglobin: 10.9 g/dL — ABNORMAL LOW (ref 12.0–15.0)
Immature Granulocytes: 1 %
Lymphocytes Relative: 7 %
Lymphs Abs: 0.4 10*3/uL — ABNORMAL LOW (ref 0.7–4.0)
MCH: 30.2 pg (ref 26.0–34.0)
MCHC: 31.6 g/dL (ref 30.0–36.0)
MCV: 95.6 fL (ref 80.0–100.0)
Monocytes Absolute: 0.5 10*3/uL (ref 0.1–1.0)
Monocytes Relative: 9 %
Neutro Abs: 4.6 10*3/uL (ref 1.7–7.7)
Neutrophils Relative %: 82 %
Platelets: 225 10*3/uL (ref 150–400)
RBC: 3.61 MIL/uL — ABNORMAL LOW (ref 3.87–5.11)
RDW: 14.5 % (ref 11.5–15.5)
WBC: 5.6 10*3/uL (ref 4.0–10.5)
nRBC: 0 % (ref 0.0–0.2)

## 2021-10-31 LAB — URINALYSIS, ROUTINE W REFLEX MICROSCOPIC
Bilirubin Urine: NEGATIVE
Glucose, UA: NEGATIVE mg/dL
Hgb urine dipstick: NEGATIVE
Leukocytes,Ua: NEGATIVE
Nitrite: NEGATIVE
Specific Gravity, Urine: 1.021 (ref 1.005–1.030)
pH: 6 (ref 5.0–8.0)

## 2021-10-31 LAB — LIPASE, BLOOD: Lipase: 19 U/L (ref 11–51)

## 2021-10-31 MED ORDER — SODIUM CHLORIDE 0.9 % IV BOLUS
500.0000 mL | Freq: Once | INTRAVENOUS | Status: AC
  Administered 2021-10-31: 500 mL via INTRAVENOUS

## 2021-10-31 MED ORDER — SODIUM CHLORIDE 0.9% FLUSH
3.0000 mL | Freq: Once | INTRAVENOUS | Status: AC
  Administered 2021-10-31: 3 mL via INTRAVENOUS
  Filled 2021-10-31: qty 3

## 2021-10-31 NOTE — ED Notes (Signed)
Patient transported to X-ray 

## 2021-10-31 NOTE — ED Notes (Signed)
I/o cath for 40 cc clear yellow urine

## 2021-10-31 NOTE — ED Provider Notes (Signed)
Emergency Department Provider Note   I have reviewed the triage vital signs and the nursing notes.   HISTORY  Chief Complaint Weakness and Urinary Retention   HPI Ariel Lambert is a 86 y.o. female with past medical history reviewed below presents to the emergency department with decreased urine output and confusion.  Daughter is at bedside and provides much of the history.  She states that the patient has seemed more somnolent and "out of it." She has had some dribbling urination.  Daughter states that this sometimes happens with urinary tract infection.  No fevers.  No URI symptoms.  Patient denies any pain in her chest or abdomen.  Past Medical History:  Diagnosis Date   Allergy    Arthritis    ASTHMA 05/03/2009   Asthma    Carotid stenosis    Chronic kidney disease    " in remission"   COPD (chronic obstructive pulmonary disease) (HCC)    CVA (cerebral infarction)    Dyslipidemia    Headache    Hearing aid worn    B/L   HYPERTENSION 04/30/2007   HYPERTHYROIDISM 06/17/2007   Hyperthyroidism    Menopause    Multinodular goiter    OSTEOPOROSIS 04/30/2007   Stroke (HCC)    Wears glasses     Review of Systems  Constitutional: No fever/chills Eyes: No visual changes. ENT: No sore throat. Cardiovascular: Denies chest pain. Respiratory: Denies shortness of breath. Gastrointestinal: No abdominal pain.  No nausea, no vomiting.  No diarrhea.  No constipation. Genitourinary: Negative for dysuria. Decreased urine output.  Musculoskeletal: Negative for back pain. Skin: Negative for rash. Neurological: Negative for headaches, focal weakness or numbness.   ____________________________________________   PHYSICAL EXAM:  VITAL SIGNS: ED Triage Vitals  Enc Vitals Group     BP 10/31/21 1201 114/72     Pulse Rate 10/31/21 1201 97     Resp 10/31/21 1201 16     Temp --      Temp src --      SpO2 10/31/21 1201 95 %     Weight 10/31/21 1205 129 lb 13.6 oz  (58.9 kg)     Height 10/31/21 1205 5\' 3"  (1.6 m)   Constitutional: Alert and oriented. Slightly slow to respond. Well appearing and in no acute distress. Eyes: Conjunctivae are normal. PERRL.  Head: Atraumatic. Nose: No congestion/rhinnorhea. Mouth/Throat: Mucous membranes are moist.  Neck: No stridor.  Cardiovascular: Normal rate, regular rhythm. Good peripheral circulation. Grossly normal heart sounds.   Respiratory: Normal respiratory effort.  No retractions. Lungs CTAB. Gastrointestinal: Soft and nontender. No distention.  Musculoskeletal: Mild swelling to the left leg with resolving bruising s/p knee replacement. No cellulitis or joint swelling/warmth. Incision is well appearing. No gross deformities of extremities. Neurologic:  Normal speech and language. Mild generalized weakness 4+/5 throughout. No numbness.  Skin:  Skin is warm, dry and intact. No rash noted.  ____________________________________________   LABS (all labs ordered are listed, but only abnormal results are displayed)  Labs Reviewed  URINALYSIS, ROUTINE W REFLEX MICROSCOPIC - Abnormal; Notable for the following components:      Result Value   Ketones, ur TRACE (*)    Protein, ur TRACE (*)    All other components within normal limits  COMPREHENSIVE METABOLIC PANEL - Abnormal; Notable for the following components:   Sodium 133 (*)    Chloride 97 (*)    Glucose, Bld 129 (*)    Total Protein 6.3 (*)    AST  9 (*)    All other components within normal limits  CBC WITH DIFFERENTIAL/PLATELET - Abnormal; Notable for the following components:   RBC 3.61 (*)    Hemoglobin 10.9 (*)    HCT 34.5 (*)    Lymphs Abs 0.4 (*)    All other components within normal limits  RESP PANEL BY RT-PCR (FLU A&B, COVID) [id number]  URINE CULTURE  LIPASE, BLOOD   ____________________________________________  EKG   EKG Interpretation  Date/Time:  Thursday October 31 2021 12:33:23 EST Ventricular Rate:  90 PR Interval:  90 QRS  Duration: 106 QT Interval:  360 QTC Calculation: 441 R Axis:   -57 Text Interpretation: Sinus or ectopic atrial rhythm Short PR interval Left anterior fascicular block RSR' in V1 or V2, right VCD or RVH LVH with secondary repolarization abnormality Similar to prior tracing from Jan 2023 Confirmed by Alona Bene [phone]) on 10/31/2021 12:41:33 PM        ____________________________________________  RADIOLOGY  DG Chest Port 1 View  Result Date: 10/31/2021 CLINICAL DATA:  Weakness and difficulty urinating. EXAM: PORTABLE CHEST 1 VIEW COMPARISON:  10/07/2021 FINDINGS: Midline trachea. Normal heart size. Atherosclerosis in the transverse aorta. Surgical clips in the low right neck. No pleural effusion or pneumothorax. Mild interstitial thickening is likely related to the clinical history of smoking. No lobar consolidation. IMPRESSION: No acute cardiopulmonary disease. Aortic Atherosclerosis (ICD10-I70.0). Electronically Signed   By: Jeronimo Greaves M.D.   On: 10/31/2021 13:15    ____________________________________________   PROCEDURES  Procedure(s) performed:   Procedures  None  ____________________________________________   INITIAL IMPRESSION / ASSESSMENT AND PLAN / ED COURSE  Pertinent labs & imaging results that were available during my care of the patient were reviewed by me and considered in my medical decision making (see chart for details).   This patient is Presenting for Evaluation of AMS, which does require a range of treatment options, and is a complaint that involves a high risk of morbidity and mortality.  The Differential Diagnoses includes but is not exclusive to alcohol, illicit or prescription medications, intracranial pathology such as stroke, intracerebral hemorrhage, fever or infectious causes including sepsis, hypoxemia, uremia, trauma, endocrine related disorders such as diabetes, hypoglycemia, thyroid-related diseases, etc.   Critical Interventions- IVF    Medications  sodium chloride flush (NS) 0.9 % injection 3 mL (3 mLs Intravenous Given 10/31/21 1244)  sodium chloride 0.9 % bolus 500 mL (0 mLs Intravenous Stopped 10/31/21 1403)  sodium chloride 0.9 % bolus 500 mL (500 mLs Intravenous New Bag/Given 10/31/21 1404)    Reassessment after intervention: Improved mental status.   I did obtain Additional Historical Information from daughter at bedside.  I decided to review pertinent External Data, and in summary patient with knee replacement in Jan 2023 with admit at that time with UTI. ED visit since with no UTI at that time.   Clinical Laboratory Tests Ordered, included CBC with no leukocytosis. Mild anemia similar to prior.  Patient with normal kidney function.  Mild hyperglycemia without DKA.  Potassium is within normal limits.  Lipase normal.  No evidence of urinary tract infection on UA.  Have sent this for culture.  COVID and flu were negative.  Radiologic Tests Ordered, included CT head and CXR. I independently interpreted the images and agree with radiology interpretation.   Cardiac Monitor Tracing which shows NSR.   Social Determinants of Health Risk patient is a former smoker.   Medical Decision Making: Summary:  Patient presents to the emergency department evaluation  of decreased energy and decreased urine output.  She does not appear to be retaining on initial evaluation.  Ultimately, in and out cath yielded around 40 mL of yellow urine.  No evidence of urinary tract infection.  We will send this for culture.  COVID and flu were negative.  No focal neurodeficits to strongly suspect new stroke.  CT imaging of the head, as above, with no acute abnormality.  Patient feeling improved with IV fluid bolus here.  She is also on some pain medications for her knee after recent surgery which may be leading to some of the symptoms.  After discussion with patient and daughter at bedside they feel comfortable returning home after IV hydration for  continued PO hydration and close PCP follow up. Discussed ED return precautions and hydration follow up plan. She starts outpatient PT next week.   Disposition: discharge  ____________________________________________  FINAL CLINICAL IMPRESSION(S) / ED DIAGNOSES  Final diagnoses:  AMS (altered mental status)    Note:  This document was prepared using Dragon voice recognition software and may include unintentional dictation errors.  Alona Bene, MD, [facility] Emergency Medicine    Alyla Pietila, Arlyss Repress, MD 10/31/21 1500

## 2021-10-31 NOTE — ED Triage Notes (Signed)
BIB daughter for increased weakness and unable to urinate since Tuesday. Daughter states that pt is not acting herself and is acting like she did last times she had a UTI. Did not eat or drink yesterday. Pt also complains of left knee pain.   Pt A&Ox4

## 2021-10-31 NOTE — Discharge Instructions (Signed)
You were seen in the emergency room today with decreased energy and decreased urine output.  You appear dehydrated we have given IV fluids.  Your lab work, CT imaging, x-rays, urine testing here were all normal.  Please drink plenty of hydrating fluids at home.  Please follow close with your primary care doctor and return with any new or suddenly worsening symptoms.

## 2021-10-31 NOTE — ED Notes (Signed)
Dc instructions reviewed with patient. Patient voiced understanding. Dc with belongings.  °

## 2021-11-01 LAB — URINE CULTURE: Culture: NO GROWTH

## 2021-11-06 DIAGNOSIS — M25662 Stiffness of left knee, not elsewhere classified: Secondary | ICD-10-CM | POA: Diagnosis not present

## 2021-11-08 DIAGNOSIS — M25662 Stiffness of left knee, not elsewhere classified: Secondary | ICD-10-CM | POA: Diagnosis not present

## 2021-11-12 DIAGNOSIS — Z4789 Encounter for other orthopedic aftercare: Secondary | ICD-10-CM | POA: Diagnosis not present

## 2021-11-13 DIAGNOSIS — M25662 Stiffness of left knee, not elsewhere classified: Secondary | ICD-10-CM | POA: Diagnosis not present

## 2021-11-13 DIAGNOSIS — Z96659 Presence of unspecified artificial knee joint: Secondary | ICD-10-CM | POA: Diagnosis not present

## 2021-11-13 DIAGNOSIS — I10 Essential (primary) hypertension: Secondary | ICD-10-CM | POA: Diagnosis not present

## 2021-11-13 DIAGNOSIS — S81802A Unspecified open wound, left lower leg, initial encounter: Secondary | ICD-10-CM | POA: Diagnosis not present

## 2021-11-15 DIAGNOSIS — M25662 Stiffness of left knee, not elsewhere classified: Secondary | ICD-10-CM | POA: Diagnosis not present

## 2021-11-26 DIAGNOSIS — M25662 Stiffness of left knee, not elsewhere classified: Secondary | ICD-10-CM | POA: Diagnosis not present

## 2021-11-28 DIAGNOSIS — M25662 Stiffness of left knee, not elsewhere classified: Secondary | ICD-10-CM | POA: Diagnosis not present

## 2022-01-15 DIAGNOSIS — Z8639 Personal history of other endocrine, nutritional and metabolic disease: Secondary | ICD-10-CM | POA: Diagnosis not present

## 2022-01-15 DIAGNOSIS — E559 Vitamin D deficiency, unspecified: Secondary | ICD-10-CM | POA: Diagnosis not present

## 2022-01-15 DIAGNOSIS — E538 Deficiency of other specified B group vitamins: Secondary | ICD-10-CM | POA: Diagnosis not present

## 2022-01-15 DIAGNOSIS — E049 Nontoxic goiter, unspecified: Secondary | ICD-10-CM | POA: Diagnosis not present

## 2022-01-15 DIAGNOSIS — I700 Atherosclerosis of aorta: Secondary | ICD-10-CM | POA: Diagnosis not present

## 2022-01-15 DIAGNOSIS — Z0000 Encounter for general adult medical examination without abnormal findings: Secondary | ICD-10-CM | POA: Diagnosis not present

## 2022-01-15 DIAGNOSIS — Z79899 Other long term (current) drug therapy: Secondary | ICD-10-CM | POA: Diagnosis not present

## 2022-01-15 DIAGNOSIS — L309 Dermatitis, unspecified: Secondary | ICD-10-CM | POA: Diagnosis not present

## 2022-01-15 DIAGNOSIS — I10 Essential (primary) hypertension: Secondary | ICD-10-CM | POA: Diagnosis not present

## 2022-01-16 ENCOUNTER — Other Ambulatory Visit: Payer: Self-pay | Admitting: Endocrinology

## 2022-01-23 ENCOUNTER — Other Ambulatory Visit: Payer: Self-pay | Admitting: Family Medicine

## 2022-01-23 DIAGNOSIS — E2839 Other primary ovarian failure: Secondary | ICD-10-CM

## 2022-04-02 DIAGNOSIS — N3946 Mixed incontinence: Secondary | ICD-10-CM | POA: Diagnosis not present

## 2022-04-02 DIAGNOSIS — R350 Frequency of micturition: Secondary | ICD-10-CM | POA: Diagnosis not present

## 2022-04-07 ENCOUNTER — Other Ambulatory Visit: Payer: Self-pay | Admitting: Family Medicine

## 2022-04-07 DIAGNOSIS — Z1231 Encounter for screening mammogram for malignant neoplasm of breast: Secondary | ICD-10-CM

## 2022-04-17 DIAGNOSIS — Z7409 Other reduced mobility: Secondary | ICD-10-CM | POA: Diagnosis not present

## 2022-04-17 DIAGNOSIS — J45909 Unspecified asthma, uncomplicated: Secondary | ICD-10-CM | POA: Diagnosis not present

## 2022-04-17 DIAGNOSIS — Z7983 Long term (current) use of bisphosphonates: Secondary | ICD-10-CM | POA: Diagnosis not present

## 2022-04-17 DIAGNOSIS — Z8673 Personal history of transient ischemic attack (TIA), and cerebral infarction without residual deficits: Secondary | ICD-10-CM | POA: Diagnosis not present

## 2022-04-17 DIAGNOSIS — R32 Unspecified urinary incontinence: Secondary | ICD-10-CM | POA: Diagnosis not present

## 2022-04-17 DIAGNOSIS — M810 Age-related osteoporosis without current pathological fracture: Secondary | ICD-10-CM | POA: Diagnosis not present

## 2022-04-17 DIAGNOSIS — E785 Hyperlipidemia, unspecified: Secondary | ICD-10-CM | POA: Diagnosis not present

## 2022-04-17 DIAGNOSIS — I10 Essential (primary) hypertension: Secondary | ICD-10-CM | POA: Diagnosis not present

## 2022-04-17 DIAGNOSIS — E0590 Thyrotoxicosis, unspecified without thyrotoxic crisis or storm: Secondary | ICD-10-CM | POA: Diagnosis not present

## 2022-05-21 DIAGNOSIS — H903 Sensorineural hearing loss, bilateral: Secondary | ICD-10-CM | POA: Diagnosis not present

## 2022-06-09 ENCOUNTER — Ambulatory Visit: Payer: Medicare PPO

## 2022-06-12 ENCOUNTER — Ambulatory Visit: Payer: Medicare PPO | Attending: Internal Medicine | Admitting: Internal Medicine

## 2022-06-24 DIAGNOSIS — K1370 Unspecified lesions of oral mucosa: Secondary | ICD-10-CM | POA: Diagnosis not present

## 2022-06-24 DIAGNOSIS — K136 Irritative hyperplasia of oral mucosa: Secondary | ICD-10-CM | POA: Diagnosis not present

## 2022-07-21 ENCOUNTER — Ambulatory Visit
Admission: RE | Admit: 2022-07-21 | Discharge: 2022-07-21 | Disposition: A | Discharge status: Home / Self Care | Payer: Medicare PPO | Source: Ambulatory Visit | Attending: Family Medicine | Admitting: Family Medicine

## 2022-07-21 DIAGNOSIS — M810 Age-related osteoporosis without current pathological fracture: Secondary | ICD-10-CM | POA: Diagnosis not present

## 2022-07-21 DIAGNOSIS — Z1231 Encounter for screening mammogram for malignant neoplasm of breast: Secondary | ICD-10-CM

## 2022-07-21 DIAGNOSIS — Z780 Asymptomatic menopausal state: Secondary | ICD-10-CM | POA: Diagnosis not present

## 2022-07-21 DIAGNOSIS — E2839 Other primary ovarian failure: Secondary | ICD-10-CM

## 2022-11-07 ENCOUNTER — Other Ambulatory Visit: Payer: Self-pay

## 2022-11-07 ENCOUNTER — Encounter (HOSPITAL_BASED_OUTPATIENT_CLINIC_OR_DEPARTMENT_OTHER): Payer: Self-pay

## 2022-11-07 ENCOUNTER — Emergency Department (HOSPITAL_BASED_OUTPATIENT_CLINIC_OR_DEPARTMENT_OTHER)
Admission: EM | Admit: 2022-11-07 | Discharge: 2022-11-07 | Disposition: A | Discharge status: Home / Self Care | Payer: Medicare PPO | Attending: Emergency Medicine | Admitting: Emergency Medicine

## 2022-11-07 DIAGNOSIS — T161XXA Foreign body in right ear, initial encounter: Secondary | ICD-10-CM | POA: Insufficient documentation

## 2022-11-07 DIAGNOSIS — W4904XA Ring or other jewelry causing external constriction, initial encounter: Secondary | ICD-10-CM | POA: Diagnosis not present

## 2022-11-07 NOTE — ED Notes (Signed)
Patient verbalizes understanding of discharge instructions. Opportunity for questioning and answers were provided. Patient discharged from ED.  °

## 2022-11-07 NOTE — ED Provider Notes (Signed)
[facility] Provider Note   CSN: [id number] Arrival date & time: 11/07/22  1215     History  Chief Complaint  Patient presents with   Foreign Body in [place] is a 87 y.o. female.  Patient here after plastic part of her hearing aid got stuck in her right ear.  She pulled her hearing aid out about 10 hours ago but plastic earpiece did not come with it.  She states that this has happened once before.  Does not have any ear pain.  No fevers or chills.  Was unable to get it out.  The history is provided by the patient.       Home Medications Prior to Admission medications   Medication Sig Start Date End Date Taking? Authorizing Provider  albuterol (PROVENTIL HFA;VENTOLIN HFA) 108 (90 BASE) MCG/ACT inhaler Inhale 2 puffs into the lungs every 6 (six) hours as needed. Patient taking differently: Inhale 2 puffs into the lungs every 6 (six) hours as needed for wheezing or shortness of breath (Asthma). 04/20/14   Collene Gobble, MD  alendronate (FOSAMAX) 70 MG tablet Take 70 mg by mouth every Friday. Take with a full glass of water on an empty stomach.    [provider]  azelastine (ASTELIN) 0.1 % nasal spray Place 1 spray into both nostrils at bedtime. Use in each nostril as directed    [provider]  diphenhydrAMINE (BENADRYL) 25 MG tablet 25 mg daily as needed for allergies.    [provider]  ezetimibe-simvastatin (VYTORIN) 10-20 MG per tablet Take 1 tablet by mouth daily after breakfast.    [provider]  ipratropium (ATROVENT) 0.03 % nasal spray Place 2 sprays into both nostrils daily.    [provider]  levalbuterol Penne Lash) 0.63 MG/3ML nebulizer solution Take 3 mLs (0.63 mg total) by nebulization every 4 (four) hours. Patient taking differently: Take 0.63 mg by nebulization every 6 (six) hours as needed for wheezing or shortness of breath. 09/07/15   Nita Sells, MD  methimazole (TAPAZOLE) 10 MG tablet TAKE 1 TABLET BY MOUTH EVERY DAY 01/16/22   Renato Shin, MD  methocarbamol (ROBAXIN) 500 MG tablet Take 1 tablet (500 mg total) by mouth every 6 (six) hours as needed for muscle spasms. Patient not taking: Reported on 10/31/2021 10/02/21   Edmisten, Kristie L, PA  montelukast (SINGULAIR) 10 MG tablet Take 10 mg by mouth at bedtime.    [provider]  SYMBICORT 160-4.5 MCG/ACT inhaler Inhale 2 puffs into the lungs 2 (two) times daily. 07/09/15   [provider]  traMADol (ULTRAM) 50 MG tablet Take 50-100 mg by mouth every 6 (six) hours as needed. 10/14/21   [provider]  valsartan-hydrochlorothiazide (DIOVAN-HCT) 320-12.5 MG tablet Take 1 tablet by mouth daily. 07/20/21   [provider]      Allergies    Cetirizine, Cetirizine & related, Codeine, Meperidine, Meperidine hcl, Nabumetone, Naproxen, Sulfa antibiotics, and Sulfonamide derivatives    Review of Systems   Review of Systems  Physical Exam Updated Vital Signs BP (!) 159/91 (BP Location: Right Arm)   Pulse 82   Temp 97.9 F (36.6 C)   Resp 20   Ht '5\' 3"'$  (1.6 m)   Wt 58.9 kg   SpO2 98%   BMI 23.00 kg/m  Physical Exam Constitutional:      Appearance: Normal appearance.  HENT:     Head: Normocephalic.  Comments: In the right ear canal a small piece of plastic Cardiovascular:     Pulses: Normal pulses.  Neurological:     Mental Status: She is alert.     ED Results / Procedures / Treatments   Labs (all labs ordered are listed, but only abnormal results are displayed) Labs Reviewed - No data to display  EKG None  Radiology No results found.  Procedures .Foreign Body Removal  Date/Time: 11/07/2022 12:34 PM  Performed by: Lennice Sites, DO Authorized by: Lennice Sites, DO  Consent: Verbal consent obtained. Risks and benefits: risks, benefits and alternatives were discussed Consent given by: patient Patient  understanding: patient states understanding of the procedure being performed Patient consent: the patient's understanding of the procedure matches consent given Required items: required blood products, implants, devices, and special equipment available Patient identity confirmed: verbally with patient Time out: Immediately prior to procedure a "time out" was called to verify the correct patient, procedure, equipment, support staff and site/side marked as required. Body area: ear Location details: right ear  Sedation: Patient sedated: no  Patient restrained: no Patient cooperative: yes Localization method: visualized Removal mechanism: forceps Complexity: simple 1 objects recovered. Objects recovered: plastic ear piece from hearing aid Post-procedure assessment: foreign body removed Patient tolerance: patient tolerated the procedure well with no immediate complications      Medications Ordered in ED Medications - No data to display  ED Course/ Medical Decision Making/ A&P                             Medical Decision Making  EIMILE SACHS is here with plastic piece of her hearing aid stuck in the right ear canal.  She removed her hearing aid about 8 hours ago but noticed that the plastic earpiece did not come with it.  She has had this stuck in her ear in the past.  I have visualized a small piece of plastic earbud type piece in her right ear canal removed with forceps without any complications.  Ear canal visualization after removal is clean, dry.  Little bit of irritation of the canal but there is no pus or purulence.  TM is intact.  Overall do not think we need any antibiotics at this time.  Discharged in good condition.  Understands return precautions.  This chart was dictated using voice recognition software.  Despite best efforts to proofread,  errors can occur which can change the documentation meaning.         Final Clinical Impression(s) / ED  Diagnoses Final diagnoses:  Foreign body of right ear, initial encounter    Rx / DC Orders ED Discharge Orders     None         Lennice Sites, DO 11/07/22 1235

## 2022-11-07 NOTE — ED Triage Notes (Signed)
Patient here POV from Home.  Endorses lodging Plastic Portion of Hearing Aid in Right Ear Canal yesterday.   NAD Noted during Triage. A&Ox4. GCS 15. Ambulatory.

## 2022-11-12 DIAGNOSIS — J449 Chronic obstructive pulmonary disease, unspecified: Secondary | ICD-10-CM | POA: Diagnosis not present

## 2022-11-12 DIAGNOSIS — L309 Dermatitis, unspecified: Secondary | ICD-10-CM | POA: Diagnosis not present

## 2022-11-12 DIAGNOSIS — J3089 Other allergic rhinitis: Secondary | ICD-10-CM | POA: Diagnosis not present

## 2022-11-12 DIAGNOSIS — J301 Allergic rhinitis due to pollen: Secondary | ICD-10-CM | POA: Diagnosis not present

## 2022-12-26 ENCOUNTER — Telehealth: Payer: Self-pay | Admitting: Endocrinology

## 2022-12-26 NOTE — Telephone Encounter (Signed)
MEDICATION: Methimazole   PHARMACY:  [facility] at [address]  HAS THE PATIENT CONTACTED THEIR PHARMACY?  yes  IS THIS A 90 DAY SUPPLY : unknown  IS PATIENT OUT OF MEDICATION: yes  IF NOT; HOW MUCH IS LEFT:   LAST APPOINTMENT DATE: @April  2022  NEXT APPOINTMENT DATE:@5 /10/2022  DO WE HAVE YOUR PERMISSION TO LEAVE A DETAILED MESSAGE?: yes  OTHER COMMENTS: former Dr Everardo All patient - has an appointment to see Dr Roosevelt Locks on 01/15/23, but needs refill for Methimazole .  Used on call provider for telephone message  **Let patient know to contact pharmacy at the end of the day to make sure medication is ready. **  ** Please notify patient to allow 48-72 hours to process**  **Encourage patient to contact the pharmacy for refills or they can request refills through [facility]**

## 2022-12-29 MED ORDER — METHIMAZOLE 10 MG PO TABS: 10.0000 mg | ORAL_TABLET | Freq: Every day | ORAL | 0 refills | Status: DC

## 2022-12-29 NOTE — Telephone Encounter (Signed)
30 day supply sent

## 2022-12-29 NOTE — Addendum Note (Signed)
Addended by: Lisabeth Pick on: 12/29/2022 09:42 AM   Modules accepted: Orders

## 2023-01-09 ENCOUNTER — Telehealth: Payer: Self-pay

## 2023-01-09 DIAGNOSIS — E0520 Thyrotoxicosis with toxic multinodular goiter without thyrotoxic crisis or storm: Secondary | ICD-10-CM

## 2023-01-09 NOTE — Telephone Encounter (Signed)
Orders Placed This Encounter  Procedures   TSH    Standing Status:   Future    Standing Expiration Date:   01/09/2024   T3, free    Standing Status:   Future    Standing Expiration Date:   01/09/2024   T4, free    Standing Status:   Future    Standing Expiration Date:   01/09/2024   Thyroid stimulating immunoglobulin    Standing Status:   Future    Standing Expiration Date:   01/09/2024

## 2023-01-12 ENCOUNTER — Other Ambulatory Visit (INDEPENDENT_AMBULATORY_CARE_PROVIDER_SITE_OTHER): Payer: Medicare PPO

## 2023-01-12 DIAGNOSIS — E0520 Thyrotoxicosis with toxic multinodular goiter without thyrotoxic crisis or storm: Secondary | ICD-10-CM | POA: Diagnosis not present

## 2023-01-12 LAB — T4, FREE: Free T4: 1.03 ng/dL (ref 0.60–1.60)

## 2023-01-12 LAB — T3, FREE: T3, Free: 2.6 pg/mL (ref 2.3–4.2)

## 2023-01-12 LAB — TSH: TSH: 1.06 u[IU]/mL (ref 0.35–5.50)

## 2023-01-14 LAB — THYROID STIMULATING IMMUNOGLOBULIN: TSI: <89 % baseline (ref <140)

## 2023-01-15 ENCOUNTER — Ambulatory Visit: Payer: Medicare PPO | Admitting: "Endocrinology

## 2023-01-15 ENCOUNTER — Encounter: Payer: Self-pay | Admitting: "Endocrinology

## 2023-01-15 VITALS — BP 140/86 | HR 71 | Ht 63.0 in | Wt 130.8 lb

## 2023-01-15 DIAGNOSIS — E042 Nontoxic multinodular goiter: Secondary | ICD-10-CM

## 2023-01-15 DIAGNOSIS — E0590 Thyrotoxicosis, unspecified without thyrotoxic crisis or storm: Secondary | ICD-10-CM

## 2023-01-15 MED ORDER — METHIMAZOLE 10 MG PO TABS: ORAL_TABLET | ORAL | 2 refills | Status: DC

## 2023-01-15 NOTE — Progress Notes (Addendum)
Outpatient Endocrinology Note   Ariel Lambert 1935/09/16 [id number]  Referring Provider: Mila Palmer, MD Primary Care Provider: Mila Palmer, MD Subjective  Chief Complaint  Patient presents with   Follow-up    Assessment & Plan  Ariel Lambert was seen today for follow-up.  Diagnoses and all orders for this visit:  Hyperthyroidism -     T4, free; Future -     TSH; Future -     T3, free; Future -     TRAb (TSH Receptor Binding Antibody); Future  Multinodular goiter  Other orders -     methimazole (TAPAZOLE) 10 MG tablet; Alternates between 10 mg and 5 mg    Ariel Lambert is currently alternating between methimazole 10 mg and 5 mg. Patient currently clinically and biochemically euthyroid.  Educated on thyroid axis.  Discussed the etiology for hyperthyroidism including graves disease or toxic nodule Educated on thyroid axis.  Recommend the following: continue current dose   Repeat labs in 3 months or sooner if symptoms of hyper or hypothyroidism develop.  Counseled on: -complications of untreated hyperthyroidism including atrial fibrillation, heart failure and osteoporosis -side effects of Methimazole including but not limited to allergic reaction, rash, bone marrow suppression, liver dysfunction and teratogenic potential -to go urgent care if develops any fever and sore throat or any other complication for Methimazole and notify us -compliance and follow up needs    06/2019 thy ultrasound reported multinodular goiter, an indeterminate lesion or mass in the lateral right neck that measures up to 3.3 cm. This lesion appears to be separate from the thyroid tissue. Structure is indeterminate and recommend further characterization with a neck CT with IV contrast. Nodule #1 in the isthmus meets criteria for 1 year follow-up. Nodule #8 in the inferior left thyroid lobe meets criteria for follow-up but this is likely benign based on the images from  2005  07/2019 CT neck W contrast reported enlargement of the thyroid bilaterally. Numerous thyroid nodules bilaterally. 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  I spent more than 50% of today's visit counseling patient on symptoms, examination findings, lab findings, imaging results, treatment decisions and monitoring and prognosis. The patient understood the recommendations and agrees with the treatment plan. All questions regarding treatment plan were fully answered   Return in about 3 months (around 04/17/2023).   Altamese Colony, MD    I have reviewed current medications, nurse's notes, allergies, vital signs, past medical and surgical history, family medical history, and social history for this encounter. Counseled patient on symptoms, examination findings, lab findings, imaging results, treatment decisions and monitoring and prognosis. The patient understood the recommendations and agrees with the treatment plan. All questions regarding treatment plan were fully answered.   History of Present Illness Ariel Lambert is a 87 y.o. year old female who presents to our clinic with hyperthyroidism and multinodular goiter diagnosed in 1999.  Alternates between methimazole 10 mg and 5 mg.  Per prior chart, "patient had hyperthyroidism due to small multinodular goiter dx'ed approx 1999; she chose chronic tapazole rx;  most recent US was in 2015--no change; it has been well-controlled with tapazole; in 2020, she had neck mass which proved to be carotid  pseudoaneurysm, which was repaired in early 2021."  Adverse Drug Effects from Methimazole (MMI): rash no fever no throat pain no arthritis no mouth ulcers no jaundice no loss of appetite no lymphadenopathy no  Grave's Ophthalmopathy Clinical Activity Score: 0/9  Symptoms suggestive of HYPOTHYROIDISM:  fatigue - weight gain - cold intolerance  - constipation  -  Symptoms suggestive of HYPERTHYROIDISM:  weight loss  - heat intolerance - hyperdefecation  - palpitations  -  Compressive symptoms:  dysphagia  - dysphonia  - positional dyspnea (especially with simultaneous arms elevation)  -  Smokes  - On biotin  + Personal history of head/neck surgery/irradiation  -  Physical Exam  BP (!) 140/86 Comment: Repeated BP before leaving office  Pulse 71   Ht 5\' 3"  (1.6 m)   Wt 130 lb 12.8 oz (59.3 kg)   SpO2 96%   BMI 23.17 kg/m  Constitutional: well developed, well nourished Head: normocephalic, atraumatic, no exophthalmos Eyes: sclera anicteric, no redness Neck: no thyromegaly, no thyroid tenderness; + nodules palpated Lungs: normal respiratory effort Neurology: alert and oriented, no tremor Skin: dry, no appreciable rashes Musculoskeletal: no appreciable defects Psychiatric: normal mood and affect  Allergies Allergies  Allergen Reactions   Cetirizine Other (See Comments)    Stomach pain   Cetirizine & Related Other (See Comments)    Stomach pains   Codeine Itching    Severe itching   Meperidine Diarrhea and Nausea And Vomiting   Meperidine Hcl Diarrhea and Nausea And Vomiting    (Demerol)   Nabumetone Rash    (Relafen)   Naproxen Itching and Rash   Sulfa Antibiotics Rash   Sulfonamide Derivatives Rash    Current Medications Patient's Medications  New Prescriptions   No medications on file  Previous Medications   ALBUTEROL (PROVENTIL HFA;VENTOLIN HFA) 108 (90 BASE) MCG/ACT INHALER    Inhale 2 puffs into the lungs every 6 (six) hours as needed.    ALENDRONATE (FOSAMAX) 70 MG TABLET    Take 70 mg by mouth every Friday. Take with a full glass of water on an empty stomach.   AZELASTINE (ASTELIN) 0.1 % NASAL SPRAY    Place 1 spray into both nostrils at bedtime. Use in each nostril as directed   DIPHENHYDRAMINE (BENADRYL) 25 MG TABLET    25 mg daily as needed for allergies.   EZETIMIBE-SIMVASTATIN (VYTORIN) 10-20 MG PER TABLET    Take 1 tablet by mouth daily after breakfast.   IPRATROPIUM (ATROVENT) 0.03 % NASAL SPRAY    Place 2 sprays into both nostrils daily.   LEVALBUTEROL (XOPENEX) 0.63 MG/3ML NEBULIZER SOLUTION    Take 3 mLs (0.63 mg total) by nebulization every 4 (four) hours.   METHOCARBAMOL (ROBAXIN) 500 MG TABLET    Take 1 tablet (500 mg total) by mouth every 6 (six) hours as needed for muscle spasms.   MONTELUKAST (SINGULAIR) 10 MG TABLET    Take 10 mg by mouth at bedtime.   SYMBICORT 160-4.5 MCG/ACT INHALER    Inhale 2 puffs into the lungs 2 (two) times daily.   TRAMADOL (ULTRAM) 50 MG TABLET    Take 50-100 mg by mouth every 6 (six) hours as needed.   VALSARTAN-HYDROCHLOROTHIAZIDE (DIOVAN-HCT) 320-12.5 MG TABLET    Take 1 tablet by mouth daily.  Modified Medications   Modified Medication Previous Medication   METHIMAZOLE (TAPAZOLE)  10 MG TABLET methimazole (TAPAZOLE) 10 MG tablet      Alternates between 10 mg and 5 mg    Take 1 tablet (10 mg total) by mouth daily.  Discontinued Medications   No medications on file    Past Medical History Past Medical History:  Diagnosis Date   Allergy    Arthritis    ASTHMA 05/03/2009   Asthma    Carotid stenosis    Chronic kidney disease    " in remission"   COPD (chronic obstructive pulmonary disease) (HCC)    CVA (cerebral infarction)    Dyslipidemia    Headache    Hearing aid worn    B/L   HYPERTENSION 04/30/2007   HYPERTHYROIDISM 06/17/2007   Hyperthyroidism    Menopause    Multinodular goiter    OSTEOPOROSIS 04/30/2007   Stroke (HCC)    Wears glasses     Past Surgical  History Past Surgical History:  Procedure Laterality Date   BACK SURGERY     BREAST EXCISIONAL BIOPSY Left    carotid artery surgery     CATARACT EXTRACTION W/ INTRAOCULAR LENS  IMPLANT, BILATERAL     ENDARTERECTOMY Right 09/29/2019   Procedure: REDO OF RIGHT ENDARTERECTOMY CAROTID;  Surgeon: Maeola Harman, MD;  Location: [facility];  Service: Vascular;  Laterality: Right;   NECK SURGERY     OTHER SURGICAL HISTORY     discectomy   TOTAL KNEE ARTHROPLASTY Left 09/30/2021   Procedure: TOTAL KNEE ARTHROPLASTY;  Surgeon: Ollen Gross, MD;  Location: [facility] ORS;  Service: Orthopedics;  Laterality: Left;   WISDOM TOOTH EXTRACTION      Family History family history includes Breast cancer (age of onset: 15) in her mother; Heart disease in an other family member.  Social History Social History   Socioeconomic History   Marital status: Widowed    Spouse name: Not on file   Number of children: Not on file   Years of education: Not on file   Highest education level: Not on file  Occupational History   Occupation: Teacher    Employer: RETIRED  Tobacco Use   Smoking status: Former    Types: Cigarettes   Smokeless tobacco: Never   Tobacco comments:    "Quit smoking cigarettes in my 40's "  Vaping Use   Vaping Use: Never used  Substance and Sexual Activity   Alcohol use: Yes    Alcohol/week: 0.0 standard drinks of alcohol    Comment: Socially   Drug use: No   Sexual activity: Not on file  Other Topics Concern   Not on file  Social History Narrative   Lives alone.   Social Determinants of Health   Financial Resource Strain: Not on file  Food Insecurity: Not on file  Transportation Needs: Not on file  Physical Activity: Not on file  Stress: Not on file  Social Connections: Not on file  Intimate Partner Violence: Not on file    Laboratory Investigations Lab Results  Component Value Date   TSH 1.06 01/12/2023   FREET4 1.03 01/12/2023     Lab Results  Component  Value Date   TSI <89 01/12/2023     Lab Results  Component Value Date   CREATININE 0.57 10/31/2021   No results found for: "GFR"    Component Value Date/Time   NA 133 (L) 10/31/2021 1220   K 4.1 10/31/2021 1220   CL 97 (L) 10/31/2021 1220   CO2 26 10/31/2021 1220   GLUCOSE 129 (H)  10/31/2021 1220   BUN 16 10/31/2021 1220   CREATININE 0.57 10/31/2021 1220   CALCIUM 9.0 10/31/2021 1220   PROT 6.3 (L) 10/31/2021 1220   ALBUMIN 3.6 10/31/2021 1220   AST 9 (L) 10/31/2021 1220   ALT 13 10/31/2021 1220   ALKPHOS 63 10/31/2021 1220   BILITOT 0.6 10/31/2021 1220   GFRNONAA >60 10/31/2021 1220   GFRAA >60 09/30/2019 0430      Latest Ref Rng & Units 10/31/2021   12:20 PM 10/17/2021   11:00 AM 10/07/2021    7:50 AM  BMP  Glucose 70 - 99 mg/dL 161  91  096   BUN 8 - 23 mg/dL 16  12  13    Creatinine 0.44 - 1.00 mg/dL 0.45  4.09  8.11   Sodium 135 - 145 mmol/L 133  133  129   Potassium 3.5 - 5.1 mmol/L 4.1  3.8  3.6   Chloride 98 - 111 mmol/L 97  98  98   CO2 22 - 32 mmol/L 26  26  26    Calcium 8.9 - 10.3 mg/dL 9.0  9.1  8.0        Component Value Date/Time   WBC 5.6 10/31/2021 1220   RBC 3.61 (L) 10/31/2021 1220   HGB 10.9 (L) 10/31/2021 1220   HCT 34.5 (L) 10/31/2021 1220   PLT 225 10/31/2021 1220   MCV 95.6 10/31/2021 1220   MCH 30.2 10/31/2021 1220   MCHC 31.6 10/31/2021 1220   RDW 14.5 10/31/2021 1220   LYMPHSABS 0.4 (L) 10/31/2021 1220   MONOABS 0.5 10/31/2021 1220   EOSABS 0.0 10/31/2021 1220   BASOSABS 0.0 10/31/2021 1220      Parts of this note may have been dictated using voice recognition software. There may be variances in spelling and vocabulary which are unintentional. Not all errors are proofread. Please notify the Thereasa Parkin if any discrepancies are noted or if the meaning of any statement is not clear.

## 2023-01-15 NOTE — Patient Instructions (Signed)
-  Complications of untreated hyperthyroidism including atrial fibrillation, heart failure and osteoporosis -Side effects of Methimazole including but not limited to allergic reaction, rash, bone marrow suppression, liver dysfunction and teratogenic potential -Go urgent care if develops any fever and sore throat or any other complication for Methimazole and notify us

## 2023-01-21 DIAGNOSIS — I10 Essential (primary) hypertension: Secondary | ICD-10-CM | POA: Diagnosis not present

## 2023-01-21 DIAGNOSIS — E538 Deficiency of other specified B group vitamins: Secondary | ICD-10-CM | POA: Diagnosis not present

## 2023-01-21 DIAGNOSIS — Z79899 Other long term (current) drug therapy: Secondary | ICD-10-CM | POA: Diagnosis not present

## 2023-01-21 DIAGNOSIS — G319 Degenerative disease of nervous system, unspecified: Secondary | ICD-10-CM | POA: Diagnosis not present

## 2023-01-21 DIAGNOSIS — Z0000 Encounter for general adult medical examination without abnormal findings: Secondary | ICD-10-CM | POA: Diagnosis not present

## 2023-01-21 DIAGNOSIS — G479 Sleep disorder, unspecified: Secondary | ICD-10-CM | POA: Diagnosis not present

## 2023-01-21 DIAGNOSIS — D7589 Other specified diseases of blood and blood-forming organs: Secondary | ICD-10-CM | POA: Diagnosis not present

## 2023-01-21 DIAGNOSIS — I700 Atherosclerosis of aorta: Secondary | ICD-10-CM | POA: Diagnosis not present

## 2023-01-21 DIAGNOSIS — R7301 Impaired fasting glucose: Secondary | ICD-10-CM | POA: Diagnosis not present

## 2023-01-21 DIAGNOSIS — F322 Major depressive disorder, single episode, severe without psychotic features: Secondary | ICD-10-CM | POA: Diagnosis not present

## 2023-01-21 DIAGNOSIS — J449 Chronic obstructive pulmonary disease, unspecified: Secondary | ICD-10-CM | POA: Diagnosis not present

## 2023-02-05 ENCOUNTER — Other Ambulatory Visit: Payer: Self-pay

## 2023-02-05 ENCOUNTER — Emergency Department (HOSPITAL_COMMUNITY): Payer: Medicare PPO

## 2023-02-05 ENCOUNTER — Encounter (HOSPITAL_COMMUNITY): Payer: Self-pay | Admitting: Internal Medicine

## 2023-02-05 ENCOUNTER — Inpatient Hospital Stay (HOSPITAL_COMMUNITY)
Admission: EM | Admit: 2023-02-05 | Discharge: 2023-02-11 | DRG: 521 | Disposition: A | Discharge status: Skilled Nursing Facility | Payer: Medicare PPO | Attending: Internal Medicine | Admitting: Internal Medicine

## 2023-02-05 ENCOUNTER — Inpatient Hospital Stay (HOSPITAL_COMMUNITY): Payer: Medicare PPO

## 2023-02-05 DIAGNOSIS — Z7982 Long term (current) use of aspirin: Secondary | ICD-10-CM

## 2023-02-05 DIAGNOSIS — J690 Pneumonitis due to inhalation of food and vomit: Secondary | ICD-10-CM | POA: Diagnosis not present

## 2023-02-05 DIAGNOSIS — I700 Atherosclerosis of aorta: Secondary | ICD-10-CM | POA: Diagnosis not present

## 2023-02-05 DIAGNOSIS — J4489 Other specified chronic obstructive pulmonary disease: Secondary | ICD-10-CM | POA: Diagnosis present

## 2023-02-05 DIAGNOSIS — Z743 Need for continuous supervision: Secondary | ICD-10-CM | POA: Diagnosis not present

## 2023-02-05 DIAGNOSIS — D62 Acute posthemorrhagic anemia: Secondary | ICD-10-CM | POA: Diagnosis not present

## 2023-02-05 DIAGNOSIS — M25551 Pain in right hip: Secondary | ICD-10-CM | POA: Diagnosis not present

## 2023-02-05 DIAGNOSIS — W010XXA Fall on same level from slipping, tripping and stumbling without subsequent striking against object, initial encounter: Secondary | ICD-10-CM | POA: Diagnosis not present

## 2023-02-05 DIAGNOSIS — E039 Hypothyroidism, unspecified: Secondary | ICD-10-CM | POA: Diagnosis present

## 2023-02-05 DIAGNOSIS — Z885 Allergy status to narcotic agent status: Secondary | ICD-10-CM

## 2023-02-05 DIAGNOSIS — Z87891 Personal history of nicotine dependence: Secondary | ICD-10-CM

## 2023-02-05 DIAGNOSIS — Z96652 Presence of left artificial knee joint: Secondary | ICD-10-CM | POA: Diagnosis present

## 2023-02-05 DIAGNOSIS — S72001A Fracture of unspecified part of neck of right femur, initial encounter for closed fracture: Secondary | ICD-10-CM

## 2023-02-05 DIAGNOSIS — E860 Dehydration: Secondary | ICD-10-CM | POA: Diagnosis present

## 2023-02-05 DIAGNOSIS — J9601 Acute respiratory failure with hypoxia: Secondary | ICD-10-CM | POA: Diagnosis not present

## 2023-02-05 DIAGNOSIS — S72011A Unspecified intracapsular fracture of right femur, initial encounter for closed fracture: Secondary | ICD-10-CM | POA: Diagnosis not present

## 2023-02-05 DIAGNOSIS — J449 Chronic obstructive pulmonary disease, unspecified: Secondary | ICD-10-CM

## 2023-02-05 DIAGNOSIS — M25572 Pain in left ankle and joints of left foot: Secondary | ICD-10-CM | POA: Diagnosis not present

## 2023-02-05 DIAGNOSIS — Z961 Presence of intraocular lens: Secondary | ICD-10-CM | POA: Diagnosis present

## 2023-02-05 DIAGNOSIS — J9811 Atelectasis: Secondary | ICD-10-CM | POA: Diagnosis not present

## 2023-02-05 DIAGNOSIS — R509 Fever, unspecified: Secondary | ICD-10-CM | POA: Diagnosis not present

## 2023-02-05 DIAGNOSIS — Z886 Allergy status to analgesic agent status: Secondary | ICD-10-CM | POA: Diagnosis not present

## 2023-02-05 DIAGNOSIS — Y92009 Unspecified place in unspecified non-institutional (private) residence as the place of occurrence of the external cause: Secondary | ICD-10-CM | POA: Diagnosis not present

## 2023-02-05 DIAGNOSIS — S72351D Displaced comminuted fracture of shaft of right femur, subsequent encounter for closed fracture with routine healing: Secondary | ICD-10-CM | POA: Diagnosis not present

## 2023-02-05 DIAGNOSIS — E785 Hyperlipidemia, unspecified: Secondary | ICD-10-CM | POA: Diagnosis present

## 2023-02-05 DIAGNOSIS — E871 Hypo-osmolality and hyponatremia: Secondary | ICD-10-CM | POA: Diagnosis not present

## 2023-02-05 DIAGNOSIS — J189 Pneumonia, unspecified organism: Secondary | ICD-10-CM | POA: Diagnosis not present

## 2023-02-05 DIAGNOSIS — R338 Other retention of urine: Secondary | ICD-10-CM | POA: Insufficient documentation

## 2023-02-05 DIAGNOSIS — S72041A Displaced fracture of base of neck of right femur, initial encounter for closed fracture: Secondary | ICD-10-CM | POA: Diagnosis not present

## 2023-02-05 DIAGNOSIS — W1830XA Fall on same level, unspecified, initial encounter: Secondary | ICD-10-CM | POA: Diagnosis present

## 2023-02-05 DIAGNOSIS — D696 Thrombocytopenia, unspecified: Secondary | ICD-10-CM | POA: Diagnosis present

## 2023-02-05 DIAGNOSIS — Z9842 Cataract extraction status, left eye: Secondary | ICD-10-CM

## 2023-02-05 DIAGNOSIS — D72829 Elevated white blood cell count, unspecified: Secondary | ICD-10-CM | POA: Diagnosis not present

## 2023-02-05 DIAGNOSIS — Z882 Allergy status to sulfonamides status: Secondary | ICD-10-CM

## 2023-02-05 DIAGNOSIS — M7989 Other specified soft tissue disorders: Secondary | ICD-10-CM | POA: Diagnosis present

## 2023-02-05 DIAGNOSIS — K5900 Constipation, unspecified: Secondary | ICD-10-CM | POA: Diagnosis not present

## 2023-02-05 DIAGNOSIS — J90 Pleural effusion, not elsewhere classified: Secondary | ICD-10-CM | POA: Diagnosis not present

## 2023-02-05 DIAGNOSIS — R739 Hyperglycemia, unspecified: Secondary | ICD-10-CM | POA: Diagnosis present

## 2023-02-05 DIAGNOSIS — E0590 Thyrotoxicosis, unspecified without thyrotoxic crisis or storm: Secondary | ICD-10-CM

## 2023-02-05 DIAGNOSIS — I471 Supraventricular tachycardia, unspecified: Secondary | ICD-10-CM | POA: Diagnosis present

## 2023-02-05 DIAGNOSIS — T8141XA Infection following a procedure, superficial incisional surgical site, initial encounter: Secondary | ICD-10-CM | POA: Diagnosis not present

## 2023-02-05 DIAGNOSIS — T402X5A Adverse effect of other opioids, initial encounter: Secondary | ICD-10-CM | POA: Diagnosis present

## 2023-02-05 DIAGNOSIS — Z888 Allergy status to other drugs, medicaments and biological substances status: Secondary | ICD-10-CM

## 2023-02-05 DIAGNOSIS — Y929 Unspecified place or not applicable: Secondary | ICD-10-CM

## 2023-02-05 DIAGNOSIS — I2510 Atherosclerotic heart disease of native coronary artery without angina pectoris: Secondary | ICD-10-CM | POA: Diagnosis present

## 2023-02-05 DIAGNOSIS — R600 Localized edema: Secondary | ICD-10-CM | POA: Diagnosis not present

## 2023-02-05 DIAGNOSIS — Z8673 Personal history of transient ischemic attack (TIA), and cerebral infarction without residual deficits: Secondary | ICD-10-CM

## 2023-02-05 DIAGNOSIS — Z8781 Personal history of (healed) traumatic fracture: Secondary | ICD-10-CM

## 2023-02-05 DIAGNOSIS — Z4789 Encounter for other orthopedic aftercare: Secondary | ICD-10-CM | POA: Diagnosis not present

## 2023-02-05 DIAGNOSIS — Z4889 Encounter for other specified surgical aftercare: Secondary | ICD-10-CM | POA: Diagnosis not present

## 2023-02-05 DIAGNOSIS — Z96641 Presence of right artificial hip joint: Secondary | ICD-10-CM | POA: Diagnosis not present

## 2023-02-05 DIAGNOSIS — E8809 Other disorders of plasma-protein metabolism, not elsewhere classified: Secondary | ICD-10-CM | POA: Diagnosis not present

## 2023-02-05 DIAGNOSIS — M810 Age-related osteoporosis without current pathological fracture: Secondary | ICD-10-CM | POA: Diagnosis present

## 2023-02-05 DIAGNOSIS — Z79899 Other long term (current) drug therapy: Secondary | ICD-10-CM | POA: Diagnosis not present

## 2023-02-05 DIAGNOSIS — R5082 Postprocedural fever: Secondary | ICD-10-CM | POA: Diagnosis not present

## 2023-02-05 DIAGNOSIS — Z7951 Long term (current) use of inhaled steroids: Secondary | ICD-10-CM

## 2023-02-05 DIAGNOSIS — I10 Essential (primary) hypertension: Secondary | ICD-10-CM

## 2023-02-05 DIAGNOSIS — Z9841 Cataract extraction status, right eye: Secondary | ICD-10-CM

## 2023-02-05 DIAGNOSIS — Z7983 Long term (current) use of bisphosphonates: Secondary | ICD-10-CM

## 2023-02-05 DIAGNOSIS — R0902 Hypoxemia: Secondary | ICD-10-CM | POA: Diagnosis not present

## 2023-02-05 DIAGNOSIS — W19XXXA Unspecified fall, initial encounter: Secondary | ICD-10-CM | POA: Diagnosis not present

## 2023-02-05 DIAGNOSIS — Z471 Aftercare following joint replacement surgery: Secondary | ICD-10-CM | POA: Diagnosis not present

## 2023-02-05 DIAGNOSIS — S79919A Unspecified injury of unspecified hip, initial encounter: Secondary | ICD-10-CM | POA: Diagnosis not present

## 2023-02-05 LAB — CBC
HCT: 39.3 % (ref 36.0–46.0)
Hemoglobin: 12.7 g/dL (ref 12.0–15.0)
MCH: 32.7 pg (ref 26.0–34.0)
MCHC: 32.3 g/dL (ref 30.0–36.0)
MCV: 101.3 fL — ABNORMAL HIGH (ref 80.0–100.0)
Platelets: 202 10*3/uL (ref 150–400)
RBC: 3.88 MIL/uL (ref 3.87–5.11)
RDW: 13.8 % (ref 11.5–15.5)
WBC: 12.6 10*3/uL — ABNORMAL HIGH (ref 4.0–10.5)
nRBC: 0 % (ref 0.0–0.2)

## 2023-02-05 LAB — URINALYSIS, ROUTINE W REFLEX MICROSCOPIC
Bilirubin Urine: NEGATIVE
Glucose, UA: NEGATIVE mg/dL
Hgb urine dipstick: NEGATIVE
Ketones, ur: NEGATIVE mg/dL
Leukocytes,Ua: NEGATIVE
Nitrite: NEGATIVE
Protein, ur: NEGATIVE mg/dL
Specific Gravity, Urine: 1.014 (ref 1.005–1.030)
pH: 5 (ref 5.0–8.0)

## 2023-02-05 LAB — TYPE AND SCREEN
ABO/RH(D): O POS
Antibody Screen: NEGATIVE

## 2023-02-05 LAB — BASIC METABOLIC PANEL
Anion gap: 13 (ref 5–15)
BUN: 12 mg/dL (ref 8–23)
CO2: 23 mmol/L (ref 22–32)
Calcium: 8.6 mg/dL — ABNORMAL LOW (ref 8.9–10.3)
Chloride: 100 mmol/L (ref 98–111)
Creatinine, Ser: 0.58 mg/dL (ref 0.44–1.00)
GFR, Estimated: >60 mL/min (ref >60)
Glucose, Bld: 121 mg/dL — ABNORMAL HIGH (ref 70–99)
Potassium: 3.8 mmol/L (ref 3.5–5.1)
Sodium: 136 mmol/L (ref 135–145)

## 2023-02-05 MED ORDER — MAGNESIUM GLUCONATE 500 MG PO TABS
500.0000 mg | ORAL_TABLET | Freq: Every day | ORAL | Status: DC
  Administered 2023-02-05 – 2023-02-11 (×6): 500 mg via ORAL
  Filled 2023-02-05 (×8): qty 1

## 2023-02-05 MED ORDER — AZELASTINE HCL 0.1 % NA SOLN
1.0000 | Freq: Every evening | NASAL | Status: DC
  Administered 2023-02-05: 1 via NASAL
  Filled 2023-02-05: qty 30

## 2023-02-05 MED ORDER — UMECLIDINIUM BROMIDE 62.5 MCG/ACT IN AEPB
1.0000 | INHALATION_SPRAY | Freq: Every day | RESPIRATORY_TRACT | Status: DC
  Administered 2023-02-07 – 2023-02-11 (×5): 1 via RESPIRATORY_TRACT
  Filled 2023-02-05: qty 7

## 2023-02-05 MED ORDER — MOMETASONE FURO-FORMOTEROL FUM 200-5 MCG/ACT IN AERO
2.0000 | INHALATION_SPRAY | Freq: Two times a day (BID) | RESPIRATORY_TRACT | Status: DC
  Administered 2023-02-05 – 2023-02-11 (×9): 2 via RESPIRATORY_TRACT
  Filled 2023-02-05 (×3): qty 8.8

## 2023-02-05 MED ORDER — METHIMAZOLE 5 MG PO TABS
7.5000 mg | ORAL_TABLET | ORAL | Status: DC
  Administered 2023-02-08 – 2023-02-10 (×2): 7.5 mg via ORAL
  Filled 2023-02-05 (×3): qty 1

## 2023-02-05 MED ORDER — ONDANSETRON HCL 4 MG/2ML IJ SOLN
4.0000 mg | Freq: Once | INTRAMUSCULAR | Status: AC
  Administered 2023-02-05: 4 mg via INTRAVENOUS
  Filled 2023-02-05: qty 2

## 2023-02-05 MED ORDER — PROCHLORPERAZINE EDISYLATE 10 MG/2ML IJ SOLN: 5.0000 mg | Freq: Four times a day (QID) | INTRAMUSCULAR | Status: DC | PRN

## 2023-02-05 MED ORDER — EZETIMIBE-SIMVASTATIN 10-20 MG PO TABS
1.0000 | ORAL_TABLET | Freq: Every day | ORAL | Status: DC
  Filled 2023-02-05 (×3): qty 1

## 2023-02-05 MED ORDER — HYDRALAZINE HCL 20 MG/ML IJ SOLN: 5.0000 mg | Freq: Four times a day (QID) | INTRAMUSCULAR | Status: DC | PRN

## 2023-02-05 MED ORDER — HYDROMORPHONE HCL 1 MG/ML IJ SOLN
0.5000 mg | INTRAMUSCULAR | Status: AC
  Administered 2023-02-05: 0.5 mg via INTRAVENOUS
  Filled 2023-02-05: qty 1

## 2023-02-05 MED ORDER — LORATADINE 10 MG PO TABS
10.0000 mg | ORAL_TABLET | Freq: Every day | ORAL | Status: DC
  Administered 2023-02-05 – 2023-02-11 (×7): 10 mg via ORAL
  Filled 2023-02-05 (×7): qty 1

## 2023-02-05 MED ORDER — ACETAMINOPHEN 500 MG PO TABS
1000.0000 mg | ORAL_TABLET | Freq: Three times a day (TID) | ORAL | Status: DC
  Administered 2023-02-05 – 2023-02-06 (×4): 1000 mg via ORAL
  Filled 2023-02-05 (×4): qty 2

## 2023-02-05 MED ORDER — ACETAMINOPHEN 325 MG PO TABS: 650.0000 mg | ORAL_TABLET | Freq: Four times a day (QID) | ORAL | Status: DC | PRN

## 2023-02-05 MED ORDER — HYDROXYZINE HCL 10 MG PO TABS
10.0000 mg | ORAL_TABLET | Freq: Three times a day (TID) | ORAL | Status: DC | PRN
  Administered 2023-02-05 – 2023-02-10 (×5): 10 mg via ORAL
  Filled 2023-02-05 (×5): qty 1

## 2023-02-05 MED ORDER — FLUTICASONE PROPIONATE 50 MCG/ACT NA SUSP
2.0000 | Freq: Every day | NASAL | Status: DC
  Administered 2023-02-05 – 2023-02-11 (×7): 2 via NASAL
  Filled 2023-02-05 (×2): qty 16

## 2023-02-05 MED ORDER — MORPHINE SULFATE (PF) 4 MG/ML IV SOLN
4.0000 mg | Freq: Once | INTRAVENOUS | Status: AC
  Administered 2023-02-05: 4 mg via INTRAVENOUS
  Filled 2023-02-05: qty 1

## 2023-02-05 MED ORDER — ALENDRONATE SODIUM 70 MG PO TABS: 70.0000 mg | ORAL_TABLET | ORAL | Status: DC

## 2023-02-05 MED ORDER — SODIUM CHLORIDE 0.9 % IV BOLUS
250.0000 mL | Freq: Once | INTRAVENOUS | Status: AC
  Administered 2023-02-05: 250 mL via INTRAVENOUS

## 2023-02-05 MED ORDER — POLYETHYLENE GLYCOL 3350 17 G PO PACK: 17.0000 g | PACK | Freq: Every day | ORAL | Status: DC | PRN

## 2023-02-05 MED ORDER — HYDROMORPHONE HCL 1 MG/ML IJ SOLN
0.5000 mg | INTRAMUSCULAR | Status: AC | PRN
  Administered 2023-02-05 – 2023-02-06 (×4): 0.5 mg via INTRAVENOUS
  Filled 2023-02-05 (×4): qty 0.5

## 2023-02-05 MED ORDER — ACETAMINOPHEN 500 MG PO TABS: 500.0000 mg | ORAL_TABLET | Freq: Three times a day (TID) | ORAL | Status: DC

## 2023-02-05 MED ORDER — ALBUTEROL SULFATE HFA 108 (90 BASE) MCG/ACT IN AERS: 2.0000 | INHALATION_SPRAY | Freq: Four times a day (QID) | RESPIRATORY_TRACT | Status: DC | PRN

## 2023-02-05 MED ORDER — ASPIRIN 81 MG PO TBEC
81.0000 mg | DELAYED_RELEASE_TABLET | Freq: Every day | ORAL | Status: DC
  Administered 2023-02-05 – 2023-02-06 (×2): 81 mg via ORAL
  Filled 2023-02-05 (×2): qty 1

## 2023-02-05 MED ORDER — OXYCODONE HCL 5 MG PO TABS
5.0000 mg | ORAL_TABLET | Freq: Four times a day (QID) | ORAL | Status: AC | PRN
  Administered 2023-02-05 – 2023-02-06 (×4): 5 mg via ORAL
  Filled 2023-02-05 (×4): qty 1

## 2023-02-05 MED ORDER — SODIUM CHLORIDE 0.9 % IV BOLUS
500.0000 mL | Freq: Once | INTRAVENOUS | Status: AC
  Administered 2023-02-05: 500 mL via INTRAVENOUS

## 2023-02-05 MED ORDER — LACTATED RINGERS IV SOLN: INTRAVENOUS | Status: DC

## 2023-02-05 MED ORDER — IRBESARTAN 75 MG PO TABS
37.5000 mg | ORAL_TABLET | Freq: Every day | ORAL | Status: DC
  Administered 2023-02-05: 37.5 mg via ORAL
  Filled 2023-02-05: qty 0.5

## 2023-02-05 MED ORDER — LEVALBUTEROL HCL 0.63 MG/3ML IN NEBU: 0.6300 mg | INHALATION_SOLUTION | Freq: Four times a day (QID) | RESPIRATORY_TRACT | Status: DC | PRN

## 2023-02-05 MED ORDER — HYDROMORPHONE HCL 1 MG/ML IJ SOLN: 0.5000 mg | INTRAMUSCULAR | Status: DC | PRN

## 2023-02-05 MED ORDER — SENNOSIDES-DOCUSATE SODIUM 8.6-50 MG PO TABS
1.0000 | ORAL_TABLET | Freq: Two times a day (BID) | ORAL | Status: DC
  Administered 2023-02-05 – 2023-02-06 (×3): 1 via ORAL
  Filled 2023-02-05 (×3): qty 1

## 2023-02-05 MED ORDER — HYDROMORPHONE HCL 1 MG/ML IJ SOLN: 0.5000 mg | Freq: Once | INTRAMUSCULAR | Status: DC

## 2023-02-05 NOTE — Consult Note (Addendum)
Reason for Consult:R hip fx Referring Physician: Dr Wilkie Aye (ER)  Ariel Lambert is an 87 y.o. female.  HPI: Pt reports mechanical fall yesterday onto R hip. No other injuries. No past hx of issues with this hip. Admitted for definitive tx.  Past Medical History:  Diagnosis Date   Allergy    Arthritis    ASTHMA 05/03/2009   Asthma    Carotid stenosis    Chronic kidney disease    " in remission"   COPD (chronic obstructive pulmonary disease) (HCC)    CVA (cerebral infarction)    Dyslipidemia    Headache    Hearing aid worn    B/L   HYPERTENSION 04/30/2007   HYPERTHYROIDISM 06/17/2007   Hyperthyroidism    Menopause    Multinodular goiter    OSTEOPOROSIS 04/30/2007   Stroke (HCC)    Wears glasses     Past Surgical History:  Procedure Laterality Date   BACK SURGERY     BREAST EXCISIONAL BIOPSY Left    carotid artery surgery     CATARACT EXTRACTION W/ INTRAOCULAR LENS  IMPLANT, BILATERAL     ENDARTERECTOMY Right 09/29/2019   Procedure: REDO OF RIGHT ENDARTERECTOMY CAROTID;  Surgeon: Maeola Harman, MD;  Location: [facility] OR;  Service: Vascular;  Laterality: Right;   NECK SURGERY     OTHER SURGICAL HISTORY     discectomy   TOTAL KNEE ARTHROPLASTY Left 09/30/2021   Procedure: TOTAL KNEE ARTHROPLASTY;  Surgeon: Ollen Gross, MD;  Location: [facility] ORS;  Service: Orthopedics;  Laterality: Left;   WISDOM TOOTH EXTRACTION      Family History  Problem Relation Age of Onset   Heart disease Other    Breast cancer Mother 77    Social History:  reports that she has quit smoking. Her smoking use included cigarettes. She has never used smokeless tobacco. She reports current alcohol use. She reports that she does not use drugs.  Allergies:  Allergies  Allergen Reactions   Cetirizine Other (See Comments)    Stomach pain   Codeine Itching    Severe itching   Meperidine Hcl Diarrhea and Nausea And Vomiting    (Demerol)   Nabumetone Rash    (Relafen)   Naproxen  Itching and Rash   Sulfa Antibiotics Rash   Sulfonamide Derivatives Rash    Medications: I have reviewed the patient's current medications.  Results for orders placed or performed during the hospital encounter of 02/05/23 (from the past 48 hour(s))  Basic metabolic panel     Status: Abnormal   Collection Time: 02/05/23  2:05 AM  Result Value Ref Range   Sodium 136 135 - 145 mmol/L   Potassium 3.8 3.5 - 5.1 mmol/L   Chloride 100 98 - 111 mmol/L   CO2 23 22 - 32 mmol/L   Glucose, Bld 121 (H) 70 - 99 mg/dL    Comment: Glucose reference range applies only to samples taken after fasting for at least 8 hours.   BUN 12 8 - 23 mg/dL   Creatinine, Ser 1.61 0.44 - 1.00 mg/dL   Calcium 8.6 (L) 8.9 - 10.3 mg/dL   GFR, Estimated >09 >60 mL/min    Comment: (NOTE) Calculated using the CKD-EPI Creatinine Equation (2021)    Anion gap 13 5 - 15    Comment: Performed at [facility], [address], [place], [place] [address]  CBC     Status: Abnormal   Collection Time: 02/05/23  2:05 AM  Result Value Ref  Range   WBC 12.6 (H) 4.0 - 10.5 K/uL   RBC 3.88 3.87 - 5.11 MIL/uL   Hemoglobin 12.7 12.0 - 15.0 g/dL   HCT 16.1 09.6 - 04.5 %   MCV 101.3 (H) 80.0 - 100.0 fL   MCH 32.7 26.0 - 34.0 pg   MCHC 32.3 30.0 - 36.0 g/dL   RDW 40.9 81.1 - 91.4 %   Platelets 202 150 - 400 K/uL   nRBC 0.0 0.0 - 0.2 %    Comment: Performed at [facility], [address], [place], [place] [address]  Urinalysis, Routine w reflex microscopic -Urine, Catheterized     Status: None   Collection Time: 02/05/23  6:12 AM  Result Value Ref Range   Color, Urine YELLOW YELLOW   APPearance CLEAR CLEAR   Specific Gravity, Urine 1.014 1.005 - 1.030   pH 5.0 5.0 - 8.0   Glucose, UA NEGATIVE NEGATIVE mg/dL   Hgb urine dipstick NEGATIVE NEGATIVE   Bilirubin Urine NEGATIVE NEGATIVE   Ketones, ur NEGATIVE NEGATIVE mg/dL   Protein, ur NEGATIVE NEGATIVE mg/dL   Nitrite NEGATIVE NEGATIVE   Leukocytes,Ua NEGATIVE  NEGATIVE    Comment: Performed at [facility], [address] [place], [place] [address]    DG Hip Unilat  With Pelvis 2-3 Views Right  Result Date: 02/05/2023 CLINICAL DATA:  Hip pain. EXAM: DG HIP (WITH OR WITHOUT PELVIS) 2-3V RIGHT COMPARISON:  None Available. FINDINGS: There is a subcapital fracture of the proximal right femur with mild superior subluxation of the distal fracture fragment. No dislocation. Moderate degenerative changes are noted at the right hip and mild degenerative changes are present at the left hip. Vascular calcifications are noted in the lower extremities bilaterally. Degenerative changes are noted in the lower lumbar spine. IMPRESSION: Mildly displaced subcapital femoral fracture on the right. Electronically Signed   By: Thornell Sartorius M.D.   On: 02/05/2023 02:30    Review of Systems  Constitutional: Negative.   HENT: Negative.    Eyes: Negative.   Respiratory: Negative.    Cardiovascular: Negative.   Gastrointestinal: Negative.   Endocrine: Negative.   Genitourinary: Negative.   Musculoskeletal:  Positive for arthralgias and gait problem.  Skin: Negative.   Psychiatric/Behavioral: Negative.     Blood pressure 124/84, pulse 67, temperature 98.5 F (36.9 C), temperature source Oral, resp. rate 17, height 5\' 3"  (1.6 m), weight 59.3 kg, SpO2 99 %. Physical Exam Constitutional:      General: She is in acute distress.     Appearance: Normal appearance.  HENT:     Head: Normocephalic.     Right Ear: External ear normal.     Left Ear: External ear normal.     Nose: Nose normal.     Mouth/Throat:     Pharynx: Oropharynx is clear.  Eyes:     Conjunctiva/sclera: Conjunctivae normal.  Cardiovascular:     Rate and Rhythm: Normal rate and regular rhythm.     Pulses: Normal pulses.  Pulmonary:     Effort: Pulmonary effort is normal.  Abdominal:     General: Bowel sounds are normal.  Musculoskeletal:     Cervical back: Normal range of motion.      Comments: R leg shortened and externally rotated R hip motion reproduces hip pain Sensation intact distally No calf pain or sign of DVT  Skin:    General: Skin is warm and dry.  Neurological:     Mental Status: She is alert.  Xrays with R hip subcapital fx  Assessment/Plan: Admitted to medical service Will require surgery for R hip fx - today vs tomorrow, depending upon surgeon availability NPO for now for possible surgery today Discussed with patient and family this AM Pain control Discussed with Dr Tenna Delaine Doralee Albino 02/05/2023, 8:40 AM   Dr. Linna Caprice to proceed with surgery tomorrow - regular diet ordered for today and order for NPO after MN placed for surgery tomorrow.

## 2023-02-05 NOTE — ED Notes (Signed)
ED TO INPATIENT HANDOFF REPORT  ED Nurse Name and Phone #: [phone]  S Name/Age/Gender Ariel Lambert 87 y.o. female Room/Bed: 016C/016C  Code Status   Code Status: Full Code  Home/SNF/Other Home Patient oriented to: self, place, time, and situation Is this baseline? Yes   Triage Complete: Triage complete  Chief Complaint S/P right hip fracture [Z87.81]  Triage Note Pt arrives from home was sitting in her chair, trying to get up , fell, landed on the right hip. Only c/o right hip and leg pain. Mild shortening and outward rotation to the right leg. IV established in the left [facility], pt was given, 100 mcg fent.  4 liters for saturations at 92%. 156/85, hr 73, 97 (4 liters)   Allergies Allergies  Allergen Reactions   Cetirizine Other (See Comments)    Stomach pain   Codeine Itching    Severe itching   Meperidine Hcl Diarrhea and Nausea And Vomiting    (Demerol)   Nabumetone Rash    (Relafen)   Naproxen Itching and Rash   Sulfa Antibiotics Rash   Sulfonamide Derivatives Rash    Level of Care/Admitting Diagnosis ED Disposition     ED Disposition  Admit   Condition  --   Comment  Hospital Area: [facility] [[id number]]  Level of Care: Telemetry Surgical [105]  May admit patient to [facility] or [facility] if equivalent level of care is available:: No  Covid Evaluation: Asymptomatic - no recent exposure (last 10 days) testing not required  Diagnosis: S/P right hip fracture [9604540]  Admitting Physician: Darlin Drop [[id number]]  Attending Physician: Darlin Drop [[id number]]  Certification:: I certify this patient will need inpatient services for at least 2 midnights  Estimated Length of Stay: 2          B Medical/Surgery History Past Medical History:  Diagnosis Date   Allergy    Arthritis    ASTHMA 05/03/2009   Asthma    Carotid stenosis    Chronic kidney disease    " in remission"   COPD (chronic obstructive pulmonary disease)  (HCC)    CVA (cerebral infarction)    Dyslipidemia    Headache    Hearing aid worn    B/L   HYPERTENSION 04/30/2007   HYPERTHYROIDISM 06/17/2007   Hyperthyroidism    Menopause    Multinodular goiter    OSTEOPOROSIS 04/30/2007   Stroke (HCC)    Wears glasses    Past Surgical History:  Procedure Laterality Date   BACK SURGERY     BREAST EXCISIONAL BIOPSY Left    carotid artery surgery     CATARACT EXTRACTION W/ INTRAOCULAR LENS  IMPLANT, BILATERAL     ENDARTERECTOMY Right 09/29/2019   Procedure: REDO OF RIGHT ENDARTERECTOMY CAROTID;  Surgeon: Maeola Harman, MD;  Location: [facility] OR;  Service: Vascular;  Laterality: Right;   NECK SURGERY     OTHER SURGICAL HISTORY     discectomy   TOTAL KNEE ARTHROPLASTY Left 09/30/2021   Procedure: TOTAL KNEE ARTHROPLASTY;  Surgeon: Ollen Gross, MD;  Location: [facility] ORS;  Service: Orthopedics;  Laterality: Left;   WISDOM TOOTH EXTRACTION       A IV Location/Drains/Wounds Patient Lines/Drains/Airways Status     Active Line/Drains/Airways     Name Placement date Placement time Site Days   Peripheral IV 02/05/23 20 G Left Antecubital 02/05/23  0201  Antecubital  less than 1   External Urinary Catheter 10/31/21  1257  --  462  Intake/Output Last 24 hours No intake or output data in the 24 hours ending 02/05/23 0338  Labs/Imaging Results for orders placed or performed during the hospital encounter of 02/05/23 (from the past 48 hour(s))  Basic metabolic panel     Status: Abnormal   Collection Time: 02/05/23  2:05 AM  Result Value Ref Range   Sodium 136 135 - 145 mmol/L   Potassium 3.8 3.5 - 5.1 mmol/L   Chloride 100 98 - 111 mmol/L   CO2 23 22 - 32 mmol/L   Glucose, Bld 121 (H) 70 - 99 mg/dL    Comment: Glucose reference range applies only to samples taken after fasting for at least 8 hours.   BUN 12 8 - 23 mg/dL   Creatinine, Ser 4.78 0.44 - 1.00 mg/dL   Calcium 8.6 (L) 8.9 - 10.3 mg/dL   GFR, Estimated >29  >56 mL/min    Comment: (NOTE) Calculated using the CKD-EPI Creatinine Equation (2021)    Anion gap 13 5 - 15    Comment: Performed at [facility], [address], [place], [place] [address]  CBC     Status: Abnormal   Collection Time: 02/05/23  2:05 AM  Result Value Ref Range   WBC 12.6 (H) 4.0 - 10.5 K/uL   RBC 3.88 3.87 - 5.11 MIL/uL   Hemoglobin 12.7 12.0 - 15.0 g/dL   HCT 65.7 84.6 - 96.2 %   MCV 101.3 (H) 80.0 - 100.0 fL   MCH 32.7 26.0 - 34.0 pg   MCHC 32.3 30.0 - 36.0 g/dL   RDW 95.2 84.1 - 32.4 %   Platelets 202 150 - 400 K/uL   nRBC 0.0 0.0 - 0.2 %    Comment: Performed at [facility], [address], [place], [place] [address]   DG Hip Unilat  With Pelvis 2-3 Views Right  Result Date: 02/05/2023 CLINICAL DATA:  Hip pain. EXAM: DG HIP (WITH OR WITHOUT PELVIS) 2-3V RIGHT COMPARISON:  None Available. FINDINGS: There is a subcapital fracture of the proximal right femur with mild superior subluxation of the distal fracture fragment. No dislocation. Moderate degenerative changes are noted at the right hip and mild degenerative changes are present at the left hip. Vascular calcifications are noted in the lower extremities bilaterally. Degenerative changes are noted in the lower lumbar spine. IMPRESSION: Mildly displaced subcapital femoral fracture on the right. Electronically Signed   By: Thornell Sartorius M.D.   On: 02/05/2023 02:30    Pending Labs Unresulted Labs (From admission, onward)    None       Vitals/Pain Today's Vitals   02/05/23 0153 02/05/23 0156 02/05/23 0312 02/05/23 0332  BP: (!) 159/84     Pulse: 70     Resp: 18     Temp: 99.6 F (37.6 C)     TempSrc: Oral     SpO2: 90%     Weight:    59.3 kg  Height:    5\' 3"  (1.6 m)  PainSc:  7  3      Isolation Precautions No active isolations  Medications Medications  lactated ringers infusion (has no administration in time range)  morphine (PF) 4 MG/ML injection 4 mg (4 mg Intravenous Given  02/05/23 0248)  ondansetron (ZOFRAN) injection 4 mg (4 mg Intravenous Given 02/05/23 0246)    Mobility non-ambulatory     Focused Assessments Cardiac Assessment Handoff:    Lab Results  Component Value Date   CKTOTAL 61 03/21/2008  CKMB 1.5 03/21/2008   TROPONINI <0.03 09/20/2017   No results found for: "DDIMER" Does the Patient currently have chest pain? No    R Recommendations: See Admitting Provider Note  Report given to:   Additional Notes:

## 2023-02-05 NOTE — ED Triage Notes (Signed)
Pt arrives from home was sitting in her chair, trying to get up , fell, landed on the right hip. Only c/o right hip and leg pain. Mild shortening and outward rotation to the right leg. IV established in the left [facility], pt was given, 100 mcg fent.  4 liters for saturations at 92%. 156/85, hr 73, 97 (4 liters)

## 2023-02-05 NOTE — Progress Notes (Signed)
PROGRESS NOTE    Ariel Lambert  WUJ:[id number] DOB: 12-18-1935 DOA: 02/05/2023 PCP: Mila Palmer, MD   Chief Complaint  Patient presents with   Fall    Brief Narrative:  Patient is a pleasant 87 year old female history of hypertension, hyperlipidemia, osteoporosis, hypothyroidism, carotid artery stenosis status post carotid endarterectomy, asthma presented to the ED after mechanical fall.  Plain films of the right hip and pelvis reviewed mild displaced subcapital femoral fracture on the right.  Patient seen in consultation by orthopedics and patient for probable surgical repair 02/06/2023.   Assessment & Plan:   Principal Problem:   S/P right hip fracture Active Problems:   Leukocytosis   Acute hypoxic respiratory failure (HCC)   Hyperthyroidism   Hypertension   Chronic obstructive pulmonary disease (HCC)  #1 right hip fracture -Second due to mechanical fall -Patient seen in consultation by orthopedics and patient for surgical repair 02/06/2023. -Continue IV fluids, IV antiemetics. -Place on scheduled Tylenol 1000 mg 3 times daily. -Supportive care. -Postop DVT prophylaxis and pain management per orthopedics.  2.  Acute hypoxic respiratory failure in the setting of opioid use -Patient noted not to be on oxygen supplementation at baseline. -Patient to have received fentanyl in the ED leading to hypoxia and patient maintained on nasal cannula. -Chest x-ray ordered this morning negative for any acute infiltrate or pulmonary edema. -Continue pulmonary toileting with incentive spirometry. -Flutter valve.  3.  Leukocytosis -Likely reactive leukocytosis secondary to problem #1. -Chest x-ray obtained this morning negative for any acute infiltrate. -Urinalysis nitrite negative, leukocytes negative. -Patient afebrile. -Monitor off antibiotics. -Repeat labs in the AM.  4.  Hyperthyroidism -Resume home regimen Tapazole.  5.  COPD -Stable. -Resume home regimen  Dulera. -Place on Incruse. -Claritin. -Xopenex nebs as needed.  6.  Hypertension -Patient started on decreased dose of Avapro. -BP soft/borderline as such we will discontinue Avapro for now. -IV had callusing as needed.     DVT prophylaxis: Postop per orthopedics. Code Status: Full Family Communication: Updated patient and pastor and good friend at bedside. Disposition: TBD  Status is: Inpatient Remains inpatient appropriate because: Severity of illness   Consultants:  Orthopedics  Procedures:  Plain films right knee 02/05/2023 Chest x-ray 02/05/2023 Plain films of the right hip and pelvis 02/05/2023  Antimicrobials:  Anti-infectives (From admission, onward)    None         Subjective: Patient laying in bed, friend and pastor at bedside.  Patient states not feeling well as with complaints of right hip pain.  States pain medication lasts about 2 hours.  Denies any chest pain.  No shortness of breath.  Objective: Vitals:   02/05/23 0444 02/05/23 0745 02/05/23 0912 02/05/23 1455  BP: (!) 170/85 124/84  91/62  Pulse: 71 67 72 64  Resp: 18 17 16 17   Temp: 99.1 F (37.3 C) 98.5 F (36.9 C)  98.4 F (36.9 C)  TempSrc: Oral Oral  Oral  SpO2: 98% 99% 99% 93%  Weight:      Height:        Intake/Output Summary (Last 24 hours) at 02/05/2023 1832 Last data filed at 02/05/2023 1500 Gross per 24 hour  Intake 1377.92 ml  Output --  Net 1377.92 ml   Filed Weights   02/05/23 0332  Weight: 59.3 kg    Examination:  General exam: Appears calm and comfortable.Extremely dry mucous membranes.  Respiratory system: Clear to auscultation anterior lung fields.  No wheezes, no crackles, no rhonchi.  Fair air movement.Marland Kitchen Respiratory  effort normal. Cardiovascular system: S1 & S2 heard, RRR. No JVD, murmurs, rubs, gallops or clicks. No pedal edema. Gastrointestinal system: Abdomen is nondistended, soft and nontender. No organomegaly or masses felt. Normal bowel sounds  heard. Central nervous system: Alert and oriented. No focal neurological deficits. Extremities: RLE shortened and externally rotated. Skin: No rashes, lesions or ulcers Psychiatry: Judgement and insight appear normal. Mood & affect appropriate.     Data Reviewed: I have personally reviewed following labs and imaging studies  CBC: Recent Labs  Lab 02/05/23 0205  WBC 12.6*  HGB 12.7  HCT 39.3  MCV 101.3*  PLT 202    Basic Metabolic Panel: Recent Labs  Lab 02/05/23 0205  NA 136  K 3.8  CL 100  CO2 23  GLUCOSE 121*  BUN 12  CREATININE 0.58  CALCIUM 8.6*    GFR: Estimated Creatinine Clearance: 42.5 mL/min (by C-G formula based on SCr of 0.58 mg/dL).  Liver Function Tests: No results for input(s): "AST", "ALT", "ALKPHOS", "BILITOT", "PROT", "ALBUMIN" in the last 168 hours.  CBG: No results for input(s): "GLUCAP" in the last 168 hours.   No results found for this or any previous visit (from the past 240 hour(s)).       Radiology Studies: DG Knee Right Port  Result Date: 02/05/2023 CLINICAL DATA:  [id number] Closed displaced fracture of right femoral neck (HCC) [id number] EXAM: PORTABLE RIGHT KNEE - 1-2 VIEW COMPARISON:  None Available. FINDINGS: No fracture or dislocation. No effusion. Marked narrowing of articular cartilage in the lateral compartment with near bone on bone apposition and marginal spurring. Small medial spurs are also noted. Patchy arterial calcifications. Soft tissues otherwise unremarkable. IMPRESSION: 1. No acute findings. 2. Advanced lateral compartment DJD. Electronically Signed   By: Corlis Leak M.D.   On: 02/05/2023 10:38   DG CHEST PORT 1 VIEW  Result Date: 02/05/2023 CLINICAL DATA:  [id number] Hypoxia [id number] EXAM: PORTABLE CHEST 1 VIEW COMPARISON:  CXR 10/31/21 FINDINGS: No pleural effusion. No pneumothorax. No focal airspace opacity. Normal cardiac and mediastinal contours. No radiographically apparent displaced rib fractures. Visualized upper abdomen  is unremarkable. IMPRESSION: No focal airspace opacity. Electronically Signed   By: Lorenza Cambridge M.D.   On: 02/05/2023 09:05   DG Hip Unilat  With Pelvis 2-3 Views Right  Result Date: 02/05/2023 CLINICAL DATA:  Hip pain. EXAM: DG HIP (WITH OR WITHOUT PELVIS) 2-3V RIGHT COMPARISON:  None Available. FINDINGS: There is a subcapital fracture of the proximal right femur with mild superior subluxation of the distal fracture fragment. No dislocation. Moderate degenerative changes are noted at the right hip and mild degenerative changes are present at the left hip. Vascular calcifications are noted in the lower extremities bilaterally. Degenerative changes are noted in the lower lumbar spine. IMPRESSION: Mildly displaced subcapital femoral fracture on the right. Electronically Signed   By: Thornell Sartorius M.D.   On: 02/05/2023 02:30        Scheduled Meds:  acetaminophen  1,000 mg Oral TID   aspirin EC  81 mg Oral Daily   azelastine  1 spray Each Nare QPM   ezetimibe-simvastatin  1 tablet Oral QPC breakfast   fluticasone  2 spray Each Nare Daily    HYDROmorphone (DILAUDID) injection  0.5 mg Subcutaneous Once   irbesartan  37.5 mg Oral Daily   loratadine  10 mg Oral Daily   magnesium gluconate  500 mg Oral Daily   [START ON 02/06/2023] methimazole  7.5 mg Oral QODAY   mometasone-formoterol  2 puff Inhalation BID   senna-docusate  1 tablet Oral BID   umeclidinium bromide  1 puff Inhalation Daily   Continuous Infusions:  lactated ringers 75 mL/hr at 02/05/23 1127     LOS: 0 days    Time spent: 35 minutes    Ramiro Harvest, MD [facility]   To contact the attending provider between 7A-7P or the covering provider during after hours 7P-7A, please log into the web site [email or web address] and access using universal Christian password for that web site. If you do not have the password, please call the hospital operator.  02/05/2023, 6:32 PM

## 2023-02-05 NOTE — ED Provider Notes (Signed)
[place] EMERGENCY DEPARTMENT AT [facility] Provider Note   CSN: [id number] Arrival date & time: 02/05/23  0157     History  No chief complaint on file.   Ariel Lambert is a 87 y.o. female.  HPI     This is an 87 year old female who presents after a fall.  Patient states that she was in her scooter chair at home when she fell landing on her right hip.  She is complaining of pain in the right hip and leg.  Denies hitting her head or loss of consciousness.  Mild foreshortening was noted upon EMS arrival.  She was given 100 mcg of fentanyl en route and had some desaturations.  Not normally on oxygen.  She takes a baby aspirin daily.  Reports previous left knee replacement by Dr. Lequita Halt.  Home Medications Prior to Admission medications   Medication Sig Start Date End Date Taking? Authorizing Provider  albuterol (PROVENTIL HFA;VENTOLIN HFA) 108 (90 BASE) MCG/ACT inhaler Inhale 2 puffs into the lungs every 6 (six) hours as needed. Patient taking differently: Inhale 2 puffs into the lungs every 6 (six) hours as needed for wheezing or shortness of breath (Asthma). 04/20/14   Leslye Peer, MD  alendronate (FOSAMAX) 70 MG tablet Take 70 mg by mouth every Friday. Take with a full glass of water on an empty stomach.    [provider]  azelastine (ASTELIN) 0.1 % nasal spray Place 1 spray into both nostrils at bedtime. Use in each nostril as directed    [provider]  diphenhydrAMINE (BENADRYL) 25 MG tablet 25 mg daily as needed for allergies.    [provider]  ezetimibe-simvastatin (VYTORIN) 10-20 MG per tablet Take 1 tablet by mouth daily after breakfast.    [provider]  ipratropium (ATROVENT) 0.03 % nasal spray Place 2 sprays into both nostrils daily.    [provider]  levalbuterol Pauline Aus) 0.63 MG/3ML nebulizer solution Take 3 mLs (0.63 mg total) by nebulization every 4 (four) hours. Patient taking differently:  Take 0.63 mg by nebulization every 6 (six) hours as needed for wheezing or shortness of breath. 09/07/15   Rhetta Mura, MD  methimazole (TAPAZOLE) 10 MG tablet Alternates between 10 mg and 5 mg 01/15/23   Altamese Garvin, MD  methocarbamol (ROBAXIN) 500 MG tablet Take 1 tablet (500 mg total) by mouth every 6 (six) hours as needed for muscle spasms. Patient not taking: Reported on 10/31/2021 10/02/21   Edmisten, Kristie L, PA  montelukast (SINGULAIR) 10 MG tablet Take 10 mg by mouth at bedtime.    [provider]  SYMBICORT 160-4.5 MCG/ACT inhaler Inhale 2 puffs into the lungs 2 (two) times daily. 07/09/15   [provider]  traMADol (ULTRAM) 50 MG tablet Take 50-100 mg by mouth every 6 (six) hours as needed. Patient not taking: Reported on 01/15/2023 10/14/21   [provider]  valsartan-hydrochlorothiazide (DIOVAN-HCT) 320-12.5 MG tablet Take 1 tablet by mouth daily. 07/20/21   [provider]      Allergies    Cetirizine, Cetirizine & related, Codeine, Meperidine, Meperidine hcl, Nabumetone, Naproxen, Sulfa antibiotics, and Sulfonamide derivatives    Review of Systems   Review of Systems  Musculoskeletal:        Hip pain  All other systems reviewed and are negative.   Physical Exam Updated Vital Signs BP (!) 159/84 (BP Location: Right Arm)   Pulse 70   Temp 99.6 F (37.6 C) (Oral)   Resp 18  SpO2 90%  Physical Exam Vitals and nursing note reviewed.  Constitutional:      Appearance: She is well-developed.     Comments: Elderly, ABCs intact  HENT:     Head: Normocephalic and atraumatic.  Eyes:     Pupils: Pupils are equal, round, and reactive to light.  Cardiovascular:     Rate and Rhythm: Normal rate and regular rhythm.  Pulmonary:     Effort: Pulmonary effort is normal. No respiratory distress.     Breath sounds: Wheezing present.  Abdominal:     Palpations: Abdomen is soft.  Musculoskeletal:     Cervical back: Neck supple.      Comments: Tenderness palpation right hip, neurovascular intact distally with good DP pulses  Skin:    General: Skin is warm and dry.  Neurological:     Mental Status: She is alert and oriented to person, place, and time.  Psychiatric:        Mood and Affect: Mood normal.     ED Results / Procedures / Treatments   Labs (all labs ordered are listed, but only abnormal results are displayed) Labs Reviewed  CBC - Abnormal; Notable for the following components:      Result Value   WBC 12.6 (*)    MCV 101.3 (*)    All other components within normal limits  BASIC METABOLIC PANEL    EKG None  Radiology DG Hip Unilat  With Pelvis 2-3 Views Right  Result Date: 02/05/2023 CLINICAL DATA:  Hip pain. EXAM: DG HIP (WITH OR WITHOUT PELVIS) 2-3V RIGHT COMPARISON:  None Available. FINDINGS: There is a subcapital fracture of the proximal right femur with mild superior subluxation of the distal fracture fragment. No dislocation. Moderate degenerative changes are noted at the right hip and mild degenerative changes are present at the left hip. Vascular calcifications are noted in the lower extremities bilaterally. Degenerative changes are noted in the lower lumbar spine. IMPRESSION: Mildly displaced subcapital femoral fracture on the right. Electronically Signed   By: Thornell Sartorius M.D.   On: 02/05/2023 02:30    Procedures Procedures    Medications Ordered in ED Medications  morphine (PF) 4 MG/ML injection 4 mg (has no administration in time range)  ondansetron (ZOFRAN) injection 4 mg (has no administration in time range)    ED Course/ Medical Decision Making/ A&P Clinical Course as of 02/05/23 0241  Thu Feb 05, 2023  0240 Spoke with Dr. Shelle Iron regarding right hip fracture.  Will plan for admission to the hospitalist.  They will evaluate the patient.  Patient is NPO. [CH]    Clinical Course User Index [CH] July Linam, Mayer Masker, MD                             Medical Decision Making Amount  and/or Complexity of Data Reviewed Labs: ordered. Radiology: ordered.  Risk Prescription drug management. Decision regarding hospitalization.   This patient presents to the ED for concern of right hip pain, this involves an extensive number of treatment options, and is a complaint that carries with it a high risk of complications and morbidity.  I considered the following differential and admission for this acute, potentially life threatening condition.  The differential diagnosis includes hip fracture, contusion  MDM:    This is an 87 year old female who presents with right leg pain.  Sustained after mechanical fall.  She is nontoxic.  Vital signs are notable for a blood pressure  of 159/84.  She is on some supplemental oxygen after fentanyl.  Mild wheezing.  Only obvious injury is to the right hip.  X-ray reviewed at the bedside and shows a subcapital femoral fracture.  Patient was given pain and nausea medication.  Consulted with [facility], Dr. Shelle Iron who will have the patient evaluated first in the morning.  Will plan for admission to the hospitalist.  (Labs, imaging, consults)  Labs: I Ordered, and personally interpreted labs.  The pertinent results include: CBC, BMP  Imaging Studies ordered: I ordered imaging studies including none I independently visualized and interpreted imaging. I agree with the radiologist interpretation  Additional history obtained from chart review.  External records from outside source obtained and reviewed including prior evaluations  Cardiac Monitoring: The patient was maintained on a cardiac monitor.  If on the cardiac monitor, I personally viewed and interpreted the cardiac monitored which showed an underlying rhythm of: Sinus rhythm  Reevaluation: After the interventions noted above, I reevaluated the patient and found that they have :improved  Social Determinants of Health:  lives with family  Disposition: Admit  Co morbidities that  complicate the patient evaluation  Past Medical History:  Diagnosis Date   Allergy    Arthritis    ASTHMA 05/03/2009   Asthma    Carotid stenosis    Chronic kidney disease    " in remission"   COPD (chronic obstructive pulmonary disease) (HCC)    CVA (cerebral infarction)    Dyslipidemia    Headache    Hearing aid worn    B/L   HYPERTENSION 04/30/2007   HYPERTHYROIDISM 06/17/2007   Hyperthyroidism    Menopause    Multinodular goiter    OSTEOPOROSIS 04/30/2007   Stroke (HCC)    Wears glasses      Medicines Meds ordered this encounter  Medications   morphine (PF) 4 MG/ML injection 4 mg   ondansetron (ZOFRAN) injection 4 mg    I have reviewed the patients home medicines and have made adjustments as needed  Problem List / ED Course: Problem List Items Addressed This Visit   None Visit Diagnoses     Closed fracture of right hip, initial encounter (HCC)    -  Primary                   Final Clinical Impression(s) / ED Diagnoses Final diagnoses:  Closed fracture of right hip, initial encounter [facility])    Rx / DC Orders ED Discharge Orders     None         Shon Baton, MD 02/05/23 [phone]

## 2023-02-05 NOTE — H&P (Addendum)
History and Physical  Ariel Lambert:[id number] DOB: 1936/05/31 DOA: 02/05/2023  Referring physician: Dr. Wilkie Aye, EDP  PCP: Ariel Palmer, MD  Outpatient Specialists: Orthopedic surgery Patient coming from: Home.  Chief Complaint: Mechanical fall   HPI: Ariel Lambert is a 87 y.o. female with medical history significant for essential hypertension, hyperlipidemia, osteoporosis, hyperthyroidism, carotid artery stenosis status post carotid endarterectomy, asthma, who presented to [facility] ED via EMS after a mechanical fall at home.  The patient was sitting in her chair and tried to get up but fell and landed on her right hip.  EMS was activated and she was brought to the ED for further evaluation.  In the ED, right hip x-ray revealed mild displaced subcapital femoral fracture on the right.  EDP discussed the case with orthopedic surgery who recommended hospitalist admission.  Orthopedic surgery will see in consultation.  N.p.o. due to possible surgical intervention.  The patient received a dose of fentanyl, subsequently had a drop in her oxygen saturation.  She was placed on 4 L to maintain O2 saturation greater than 92%.  [facility], hospitalist service, was asked to admit.   ED Course: Temperature 99.1.  BP 170/85, pulse 74, respiratory rate 18, O2 saturation 98% on 2 L.  Lab studies remarkable for WBC 12.6, MCV 101.  Hemoglobin 12.7, platelet count 202.  Glucose 121, calcium 8.6.  Review of Systems: Review of systems as noted in the HPI. All other systems reviewed and are negative.   Past Medical History:  Diagnosis Date   Allergy    Arthritis    ASTHMA 05/03/2009   Asthma    Carotid stenosis    Chronic kidney disease    " in remission"   COPD (chronic obstructive pulmonary disease) (HCC)    CVA (cerebral infarction)    Dyslipidemia    Headache    Hearing aid worn    B/L   HYPERTENSION 04/30/2007   HYPERTHYROIDISM 06/17/2007   Hyperthyroidism    Menopause     Multinodular goiter    OSTEOPOROSIS 04/30/2007   Stroke (HCC)    Wears glasses    Past Surgical History:  Procedure Laterality Date   BACK SURGERY     BREAST EXCISIONAL BIOPSY Left    carotid artery surgery     CATARACT EXTRACTION W/ INTRAOCULAR LENS  IMPLANT, BILATERAL     ENDARTERECTOMY Right 09/29/2019   Procedure: REDO OF RIGHT ENDARTERECTOMY CAROTID;  Surgeon: Ariel Harman, MD;  Location: [facility] OR;  Service: Vascular;  Laterality: Right;   NECK SURGERY     OTHER SURGICAL HISTORY     discectomy   TOTAL KNEE ARTHROPLASTY Left 09/30/2021   Procedure: TOTAL KNEE ARTHROPLASTY;  Surgeon: Ariel Gross, MD;  Location: [facility] ORS;  Service: Orthopedics;  Laterality: Left;   WISDOM TOOTH EXTRACTION      Social History:  reports that she has quit smoking. Her smoking use included cigarettes. She has never used smokeless tobacco. She reports current alcohol use. She reports that she does not use drugs.   Allergies  Allergen Reactions   Cetirizine Other (See Comments)    Stomach pain   Codeine Itching    Severe itching   Meperidine Hcl Diarrhea and Nausea And Vomiting    (Demerol)   Nabumetone Rash    (Relafen)   Naproxen Itching and Rash   Sulfa Antibiotics Rash   Sulfonamide Derivatives Rash    Family History  Problem Relation Age of Onset   Heart disease Other  Breast cancer Mother 39      Prior to Admission medications   Medication Sig Start Date End Date Taking? Authorizing Provider  albuterol (PROVENTIL HFA;VENTOLIN HFA) 108 (90 BASE) MCG/ACT inhaler Inhale 2 puffs into the lungs every 6 (six) hours as needed. Patient taking differently: Inhale 2 puffs into the lungs every 6 (six) hours as needed for wheezing or shortness of breath (Asthma). 04/20/14  Yes Ariel Peer, MD  alendronate (FOSAMAX) 70 MG tablet Take 70 mg by mouth every Friday. Take with a full glass of water on an empty stomach.   Yes [provider]  aspirin EC 81 MG tablet Take 81 mg  by mouth daily. Swallow whole.   Yes [provider]  azelastine (ASTELIN) 0.1 % nasal spray Place 1 spray into both nostrils every evening. Use in each nostril as directed   Yes [provider]  BIOTIN PO Take 1 tablet by mouth daily.   Yes [provider]  desloratadine (CLARINEX) 5 MG tablet Take 5 mg by mouth every evening.   Yes [provider]  diphenhydrAMINE (BENADRYL) 25 MG tablet 25 mg daily as needed for allergies.   Yes [provider]  docusate sodium (COLACE) 100 MG capsule Take 100 mg by mouth daily.   Yes [provider]  ezetimibe-simvastatin (VYTORIN) 10-20 MG per tablet Take 1 tablet by mouth daily after breakfast.   Yes [provider]  fluticasone (FLONASE) 50 MCG/ACT nasal spray Place 2 sprays into both nostrils in the morning and at bedtime.   Yes [provider]  levalbuterol (XOPENEX) 0.63 MG/3ML nebulizer solution Take 3 mLs (0.63 mg total) by nebulization every 4 (four) hours. Patient taking differently: Take 0.63 mg by nebulization every 6 (six) hours as needed for wheezing or shortness of breath. 09/07/15  Yes Ariel Mura, MD  magnesium gluconate (MAGONATE) 500 MG tablet Take 500 mg by mouth daily.   Yes [provider]  methimazole (TAPAZOLE) 5 MG tablet Take 7.5 mg by mouth daily.   Yes [provider]  montelukast (SINGULAIR) 10 MG tablet Take 10 mg by mouth at bedtime.   Yes [provider]  Multiple Vitamin (MULTIVITAMIN WITH MINERALS) TABS tablet Take 1 tablet by mouth daily.   Yes [provider]  Ariel Lambert (OYSTER CALCIUM) 500 MG TABS tablet Take 500 mg of elemental calcium by mouth 2 (two) times daily.   Yes [provider]  SYMBICORT 160-4.5 MCG/ACT inhaler Inhale 2 puffs into the lungs 2 (two) times daily. 07/09/15  Yes [provider]  traZODone (DESYREL) 50 MG tablet Take 50 mg by mouth at bedtime.   Yes [provider]  valsartan-hydrochlorothiazide (DIOVAN-HCT) 320-12.5 MG tablet Take 1 tablet by mouth daily. 07/20/21  Yes [provider]  zinc gluconate 50 MG tablet Take 50 mg by mouth daily.   Yes [provider]    Physical Exam: BP (!) 159/84 (BP Location: Right Arm)   Pulse 70   Temp 99.6 F (37.6 C) (Oral)   Resp 18   Ht 5\' 3"  (1.6 m)   Wt 59.3 kg   SpO2 90%   BMI 23.16 kg/m   General: 87 y.o. year-old female well developed well nourished in no acute distress.  Alert and oriented x3. Cardiovascular: Regular rate and rhythm with no rubs or gallops.  No thyromegaly or JVD noted.  No lower extremity edema. 2/4 pulses in all 4 extremities. Respiratory: Clear to auscultation with no wheezes or  rales. Good inspiratory effort. Abdomen: Soft nontender nondistended with normal bowel sounds x4 quadrants. Muskuloskeletal: No cyanosis, clubbing or edema noted bilaterally Neuro: CN II-XII intact, strength, sensation, reflexes Skin: No ulcerative lesions noted or rashes Psychiatry: Judgement and insight appear normal. Mood is appropriate for condition and setting          Labs on Admission:  Basic Metabolic Panel: Recent Labs  Lab 02/05/23 0205  NA 136  K 3.8  CL 100  CO2 23  GLUCOSE 121*  BUN 12  CREATININE 0.58  CALCIUM 8.6*   Liver Function Tests: No results for input(s): "AST", "ALT", "ALKPHOS", "BILITOT", "PROT", "ALBUMIN" in the last 168 hours. No results for input(s): "LIPASE", "AMYLASE" in the last 168 hours. No results for input(s): "AMMONIA" in the last 168 hours. CBC: Recent Labs  Lab 02/05/23 0205  WBC 12.6*  HGB 12.7  HCT 39.3  MCV 101.3*  PLT 202   Cardiac Enzymes: No results for input(s): "CKTOTAL", "CKMB", "CKMBINDEX", "TROPONINI" in the last 168 hours.  BNP (last 3 results) No results for input(s): "BNP" in the last 8760 hours.  ProBNP (last 3 results) No results for input(s): "PROBNP" in the last 8760 hours.  CBG: No  results for input(s): "GLUCAP" in the last 168 hours.  Radiological Exams on Admission: DG Hip Unilat  With Pelvis 2-3 Views Right  Result Date: 02/05/2023 CLINICAL DATA:  Hip pain. EXAM: DG HIP (WITH OR WITHOUT PELVIS) 2-3V RIGHT COMPARISON:  None Available. FINDINGS: There is a subcapital fracture of the proximal right femur with mild superior subluxation of the distal fracture fragment. No dislocation. Moderate degenerative changes are noted at the right hip and mild degenerative changes are present at the left hip. Vascular calcifications are noted in the lower extremities bilaterally. Degenerative changes are noted in the lower lumbar spine. IMPRESSION: Mildly displaced subcapital femoral fracture on the right. Electronically Signed   By: Thornell Sartorius M.D.   On: 02/05/2023 02:30    EKG: I independently viewed the EKG done and my findings are as followed: None available at the time of this visit.  Assessment/Plan Present on Admission: **None**  Principal Problem:   S/P right hip fracture  Right hip fracture, post mechanical fall, POA Orthopedic surgery consulted by EDP, n.p.o. until seen by orthopedic surgery. Analgesics as needed Antiemetics and bowel regimen as needed. Fall precautions. Gentle IV fluid hydration. Fall precautions in place.  Acute hypoxic respiratory failure in the setting of opioid use Maintain O2 saturation above 92%.  Not on oxygen supplementation at baseline. Use incentive spirometer Closely monitor on telemetry.  Leukocytosis, rule out active infective process Follow UA  Mild hyperglycemia Serum glucose 121 Monitor for now.   DVT prophylaxis: Defer pharmacological DVT prophylaxis to orthopedic surgery.  Code Status: Full code  Family Communication: Updated the patient's spouse and grandson at bedside.  Disposition Plan: Admitted to telemetry surgical unit.  Consults called: Orthopedic surgery.  Admission status: Inpatient status.   Status  is: Inpatient The patient requires at least 2 midnights for further evaluation and treatment of present condition.   Darlin Drop MD [facility] Pager [phone]  If 7PM-7AM, please contact night-coverage [email or web address] Password [facility]  02/05/2023, 3:33 AM

## 2023-02-05 NOTE — H&P (View-Only) (Signed)
Reason for Consult:R hip fx Referring Physician: Dr Horton (ER)  Ariel Lambert is an 87 y.o. female.  HPI: Pt reports mechanical fall yesterday onto R hip. No other injuries. No past hx of issues with this hip. Admitted for definitive tx.  Past Medical History:  Diagnosis Date   Allergy    Arthritis    ASTHMA 05/03/2009   Asthma    Carotid stenosis    Chronic kidney disease    " in remission"   COPD (chronic obstructive pulmonary disease) (HCC)    CVA (cerebral infarction)    Dyslipidemia    Headache    Hearing aid worn    B/L   HYPERTENSION 04/30/2007   HYPERTHYROIDISM 06/17/2007   Hyperthyroidism    Menopause    Multinodular goiter    OSTEOPOROSIS 04/30/2007   Stroke (HCC)    Wears glasses     Past Surgical History:  Procedure Laterality Date   BACK SURGERY     BREAST EXCISIONAL BIOPSY Left    carotid artery surgery     CATARACT EXTRACTION W/ INTRAOCULAR LENS  IMPLANT, BILATERAL     ENDARTERECTOMY Right 09/29/2019   Procedure: REDO OF RIGHT ENDARTERECTOMY CAROTID;  Surgeon: Cain, Brandon [name], MD;  Location: [facility] OR;  Service: Vascular;  Laterality: Right;   NECK SURGERY     OTHER SURGICAL HISTORY     discectomy   TOTAL KNEE ARTHROPLASTY Left 09/30/2021   Procedure: TOTAL KNEE ARTHROPLASTY;  Surgeon: Aluisio, Frank, MD;  Location: [facility];  Service: Orthopedics;  Laterality: Left;   WISDOM TOOTH EXTRACTION      Family History  Problem Relation Age of Onset   Heart disease Other    Breast cancer Mother 60    Social History:  reports that she has quit smoking. Her smoking use included cigarettes. She has never used smokeless tobacco. She reports current alcohol use. She reports that she does not use drugs.  Allergies:  Allergies  Allergen Reactions   Cetirizine Other (See Comments)    Stomach pain   Codeine Itching    Severe itching   Meperidine Hcl Diarrhea and Nausea And Vomiting    (Demerol)   Nabumetone Rash    (Relafen)   Naproxen  Itching and Rash   Sulfa Antibiotics Rash   Sulfonamide Derivatives Rash    Medications: I have reviewed the patient's current medications.  Results for orders placed or performed during the hospital encounter of 02/05/23 (from the past 48 hour(s))  Basic metabolic panel     Status: Abnormal   Collection Time: 02/05/23  2:05 AM  Result Value Ref Range   Sodium 136 135 - 145 mmol/L   Potassium 3.8 3.5 - 5.1 mmol/L   Chloride 100 98 - 111 mmol/L   CO2 23 22 - 32 mmol/L   Glucose, Bld 121 (H) 70 - 99 mg/dL    Comment: Glucose reference range applies only to samples taken after fasting for at least 8 hours.   BUN 12 8 - 23 mg/dL   Creatinine, Ser 0.58 0.44 - 1.00 mg/dL   Calcium 8.6 (L) 8.9 - 10.3 mg/dL   GFR, Estimated >60 >60 mL/min    Comment: (NOTE) Calculated using the CKD-EPI Creatinine Equation (2021)    Anion gap 13 5 - 15    Comment: Performed at [facility], [address] [place], [place] [address]  CBC     Status: Abnormal   Collection Time: 02/05/23  2:05 AM  Result Value Ref   Range   WBC 12.6 (H) 4.0 - 10.5 K/uL   RBC 3.88 3.87 - 5.11 MIL/uL   Hemoglobin 12.7 12.0 - 15.0 g/dL   HCT 39.3 36.0 - 46.0 %   MCV 101.3 (H) 80.0 - 100.0 fL   MCH 32.7 26.0 - 34.0 pg   MCHC 32.3 30.0 - 36.0 g/dL   RDW 13.8 11.5 - 15.5 %   Platelets 202 150 - 400 K/uL   nRBC 0.0 0.0 - 0.2 %    Comment: Performed at [facility], [address] [place], [place] [address]  Urinalysis, Routine w reflex microscopic -Urine, Catheterized     Status: None   Collection Time: 02/05/23  6:12 AM  Result Value Ref Range   Color, Urine YELLOW YELLOW   APPearance CLEAR CLEAR   Specific Gravity, Urine 1.014 1.005 - 1.030   pH 5.0 5.0 - 8.0   Glucose, UA NEGATIVE NEGATIVE mg/dL   Hgb urine dipstick NEGATIVE NEGATIVE   Bilirubin Urine NEGATIVE NEGATIVE   Ketones, ur NEGATIVE NEGATIVE mg/dL   Protein, ur NEGATIVE NEGATIVE mg/dL   Nitrite NEGATIVE NEGATIVE   Leukocytes,Ua NEGATIVE  NEGATIVE    Comment: Performed at [facility], [address] [place], [place] [address]    DG Hip Unilat  With Pelvis 2-3 Views Right  Result Date: 02/05/2023 CLINICAL DATA:  Hip pain. EXAM: DG HIP (WITH OR WITHOUT PELVIS) 2-3V RIGHT COMPARISON:  None Available. FINDINGS: There is a subcapital fracture of the proximal right femur with mild superior subluxation of the distal fracture fragment. No dislocation. Moderate degenerative changes are noted at the right hip and mild degenerative changes are present at the left hip. Vascular calcifications are noted in the lower extremities bilaterally. Degenerative changes are noted in the lower lumbar spine. IMPRESSION: Mildly displaced subcapital femoral fracture on the right. Electronically Signed   By: Laura  Taylor M.D.   On: 02/05/2023 02:30    Review of Systems  Constitutional: Negative.   HENT: Negative.    Eyes: Negative.   Respiratory: Negative.    Cardiovascular: Negative.   Gastrointestinal: Negative.   Endocrine: Negative.   Genitourinary: Negative.   Musculoskeletal:  Positive for arthralgias and gait problem.  Skin: Negative.   Psychiatric/Behavioral: Negative.     Blood pressure 124/84, pulse 67, temperature 98.5 F (36.9 C), temperature source Oral, resp. rate 17, height 5' 3" (1.6 m), weight 59.3 kg, SpO2 99 %. Physical Exam Constitutional:      General: She is in acute distress.     Appearance: Normal appearance.  HENT:     Head: Normocephalic.     Right Ear: External ear normal.     Left Ear: External ear normal.     Nose: Nose normal.     Mouth/Throat:     Pharynx: Oropharynx is clear.  Eyes:     Conjunctiva/sclera: Conjunctivae normal.  Cardiovascular:     Rate and Rhythm: Normal rate and regular rhythm.     Pulses: Normal pulses.  Pulmonary:     Effort: Pulmonary effort is normal.  Abdominal:     General: Bowel sounds are normal.  Musculoskeletal:     Cervical back: Normal range of motion.      Comments: R leg shortened and externally rotated R hip motion reproduces hip pain Sensation intact distally No calf pain or sign of DVT  Skin:    General: Skin is warm and dry.  Neurological:     Mental Status: She is alert.     Xrays with R hip subcapital fx  Assessment/Plan: Admitted to medical service Will require surgery for R hip fx - today vs tomorrow, depending upon surgeon availability NPO for now for possible surgery today Discussed with patient and family this AM Pain control Discussed with Dr Beane  Adelynn Gipe M Darivs Lunden 02/05/2023, 8:40 AM   Dr. Swinteck to proceed with surgery tomorrow - regular diet ordered for today and order for NPO after MN placed for surgery tomorrow.   

## 2023-02-06 ENCOUNTER — Encounter (HOSPITAL_COMMUNITY)
Admission: EM | Disposition: A | Discharge status: Skilled Nursing Facility | Payer: Self-pay | Source: Home / Self Care | Attending: Internal Medicine

## 2023-02-06 ENCOUNTER — Inpatient Hospital Stay (HOSPITAL_COMMUNITY): Payer: Medicare PPO

## 2023-02-06 ENCOUNTER — Inpatient Hospital Stay (HOSPITAL_COMMUNITY): Payer: Medicare PPO | Admitting: Anesthesiology

## 2023-02-06 ENCOUNTER — Encounter (HOSPITAL_COMMUNITY): Payer: Self-pay | Admitting: Internal Medicine

## 2023-02-06 DIAGNOSIS — Z87891 Personal history of nicotine dependence: Secondary | ICD-10-CM | POA: Diagnosis not present

## 2023-02-06 DIAGNOSIS — I10 Essential (primary) hypertension: Secondary | ICD-10-CM | POA: Diagnosis not present

## 2023-02-06 DIAGNOSIS — J9601 Acute respiratory failure with hypoxia: Secondary | ICD-10-CM | POA: Diagnosis not present

## 2023-02-06 DIAGNOSIS — Z8781 Personal history of (healed) traumatic fracture: Secondary | ICD-10-CM | POA: Diagnosis not present

## 2023-02-06 DIAGNOSIS — J449 Chronic obstructive pulmonary disease, unspecified: Secondary | ICD-10-CM | POA: Diagnosis not present

## 2023-02-06 DIAGNOSIS — S72001A Fracture of unspecified part of neck of right femur, initial encounter for closed fracture: Secondary | ICD-10-CM

## 2023-02-06 HISTORY — PX: TOTAL HIP ARTHROPLASTY: SHX124

## 2023-02-06 LAB — BASIC METABOLIC PANEL
Anion gap: 7 (ref 5–15)
BUN: 12 mg/dL (ref 8–23)
CO2: 22 mmol/L (ref 22–32)
Calcium: 7.7 mg/dL — ABNORMAL LOW (ref 8.9–10.3)
Chloride: 105 mmol/L (ref 98–111)
Creatinine, Ser: 0.59 mg/dL (ref 0.44–1.00)
GFR, Estimated: >60 mL/min (ref >60)
Glucose, Bld: 112 mg/dL — ABNORMAL HIGH (ref 70–99)
Potassium: 3.9 mmol/L (ref 3.5–5.1)
Sodium: 134 mmol/L — ABNORMAL LOW (ref 135–145)

## 2023-02-06 LAB — CBC
HCT: 32.3 % — ABNORMAL LOW (ref 36.0–46.0)
Hemoglobin: 10.5 g/dL — ABNORMAL LOW (ref 12.0–15.0)
MCH: 33.3 pg (ref 26.0–34.0)
MCHC: 32.5 g/dL (ref 30.0–36.0)
MCV: 102.5 fL — ABNORMAL HIGH (ref 80.0–100.0)
Platelets: 162 10*3/uL (ref 150–400)
RBC: 3.15 MIL/uL — ABNORMAL LOW (ref 3.87–5.11)
RDW: 14.2 % (ref 11.5–15.5)
WBC: 8.4 10*3/uL (ref 4.0–10.5)
nRBC: 0 % (ref 0.0–0.2)

## 2023-02-06 LAB — SURGICAL PCR SCREEN
MRSA, PCR: NEGATIVE
Staphylococcus aureus: POSITIVE — AB

## 2023-02-06 SURGERY — ARTHROPLASTY, HIP, TOTAL, ANTERIOR APPROACH
Anesthesia: Monitor Anesthesia Care | Site: Hip | Laterality: Right

## 2023-02-06 MED ORDER — FENTANYL CITRATE (PF) 250 MCG/5ML IJ SOLN
INTRAMUSCULAR | Status: AC
  Filled 2023-02-06: qty 5

## 2023-02-06 MED ORDER — CEFAZOLIN SODIUM-DEXTROSE 2-4 GM/100ML-% IV SOLN: 2.0000 g | INTRAVENOUS | Status: DC

## 2023-02-06 MED ORDER — KETOROLAC TROMETHAMINE 30 MG/ML IJ SOLN
INTRAMUSCULAR | Status: DC | PRN
  Administered 2023-02-06: 30 mg via INTRAMUSCULAR

## 2023-02-06 MED ORDER — EZETIMIBE 10 MG PO TABS
10.0000 mg | ORAL_TABLET | Freq: Every day | ORAL | Status: DC
  Administered 2023-02-06 – 2023-02-11 (×6): 10 mg via ORAL
  Filled 2023-02-06 (×6): qty 1

## 2023-02-06 MED ORDER — METOCLOPRAMIDE HCL 5 MG PO TABS: 5.0000 mg | ORAL_TABLET | Freq: Three times a day (TID) | ORAL | Status: DC | PRN

## 2023-02-06 MED ORDER — TRANEXAMIC ACID-NACL 1000-0.7 MG/100ML-% IV SOLN: 1000.0000 mg | INTRAVENOUS | Status: DC

## 2023-02-06 MED ORDER — CEFAZOLIN SODIUM-DEXTROSE 2-4 GM/100ML-% IV SOLN
2.0000 g | Freq: Once | INTRAVENOUS | Status: AC
  Administered 2023-02-06: 2 g via INTRAVENOUS

## 2023-02-06 MED ORDER — SIMVASTATIN 20 MG PO TABS
20.0000 mg | ORAL_TABLET | Freq: Every day | ORAL | Status: DC
  Administered 2023-02-06 – 2023-02-11 (×6): 20 mg via ORAL
  Filled 2023-02-06 (×6): qty 1

## 2023-02-06 MED ORDER — CEFAZOLIN SODIUM-DEXTROSE 2-4 GM/100ML-% IV SOLN
INTRAVENOUS | Status: AC
  Administered 2023-02-06: 2 g via INTRAVENOUS
  Filled 2023-02-06: qty 100

## 2023-02-06 MED ORDER — VANCOMYCIN HCL 1000 MG IV SOLR
INTRAVENOUS | Status: AC
  Filled 2023-02-06: qty 20

## 2023-02-06 MED ORDER — FENTANYL CITRATE (PF) 100 MCG/2ML IJ SOLN
INTRAMUSCULAR | Status: AC
  Filled 2023-02-06: qty 2

## 2023-02-06 MED ORDER — METHOCARBAMOL 1000 MG/10ML IJ SOLN: 500.0000 mg | Freq: Four times a day (QID) | INTRAVENOUS | Status: DC | PRN

## 2023-02-06 MED ORDER — ALUM & MAG HYDROXIDE-SIMETH 200-200-20 MG/5ML PO SUSP: 30.0000 mL | ORAL | Status: DC | PRN

## 2023-02-06 MED ORDER — ONDANSETRON HCL 4 MG/2ML IJ SOLN
INTRAMUSCULAR | Status: AC
  Filled 2023-02-06: qty 2

## 2023-02-06 MED ORDER — ONDANSETRON HCL 4 MG/2ML IJ SOLN
4.0000 mg | Freq: Four times a day (QID) | INTRAMUSCULAR | Status: DC | PRN
  Filled 2023-02-06: qty 2

## 2023-02-06 MED ORDER — ONDANSETRON HCL 4 MG/2ML IJ SOLN
INTRAMUSCULAR | Status: DC | PRN
  Administered 2023-02-06: 4 mg via INTRAVENOUS

## 2023-02-06 MED ORDER — BUPIVACAINE-EPINEPHRINE (PF) 0.5% -1:200000 IJ SOLN
INTRAMUSCULAR | Status: DC | PRN
  Administered 2023-02-06: 30 mL via PERINEURAL

## 2023-02-06 MED ORDER — FENTANYL CITRATE (PF) 100 MCG/2ML IJ SOLN
25.0000 ug | INTRAMUSCULAR | Status: DC | PRN
  Administered 2023-02-06: 25 ug via INTRAVENOUS

## 2023-02-06 MED ORDER — ASPIRIN 81 MG PO CHEW
81.0000 mg | CHEWABLE_TABLET | Freq: Two times a day (BID) | ORAL | Status: DC
  Administered 2023-02-06 – 2023-02-11 (×10): 81 mg via ORAL
  Filled 2023-02-06 (×10): qty 1

## 2023-02-06 MED ORDER — KETOROLAC TROMETHAMINE 30 MG/ML IJ SOLN
INTRAMUSCULAR | Status: AC
  Filled 2023-02-06: qty 1

## 2023-02-06 MED ORDER — LACTATED RINGERS IV SOLN: INTRAVENOUS | Status: DC

## 2023-02-06 MED ORDER — PHENOL 1.4 % MT LIQD: 1.0000 | OROMUCOSAL | Status: DC | PRN

## 2023-02-06 MED ORDER — AMISULPRIDE (ANTIEMETIC) 5 MG/2ML IV SOLN: 10.0000 mg | Freq: Once | INTRAVENOUS | Status: DC | PRN

## 2023-02-06 MED ORDER — ONDANSETRON HCL 4 MG PO TABS: 4.0000 mg | ORAL_TABLET | Freq: Four times a day (QID) | ORAL | Status: DC | PRN

## 2023-02-06 MED ORDER — PHENYLEPHRINE 80 MCG/ML (10ML) SYRINGE FOR IV PUSH (FOR BLOOD PRESSURE SUPPORT)
PREFILLED_SYRINGE | INTRAVENOUS | Status: AC
  Filled 2023-02-06: qty 20

## 2023-02-06 MED ORDER — POVIDONE-IODINE 10 % EX SWAB: 2.0000 | Freq: Once | CUTANEOUS | Status: DC

## 2023-02-06 MED ORDER — MENTHOL 3 MG MT LOZG: 1.0000 | LOZENGE | OROMUCOSAL | Status: DC | PRN

## 2023-02-06 MED ORDER — CEFAZOLIN SODIUM-DEXTROSE 2-4 GM/100ML-% IV SOLN
2.0000 g | Freq: Four times a day (QID) | INTRAVENOUS | Status: AC
  Administered 2023-02-07: 2 g via INTRAVENOUS
  Filled 2023-02-06 (×2): qty 100

## 2023-02-06 MED ORDER — METHOCARBAMOL 1000 MG/10ML IJ SOLN
500.0000 mg | Freq: Three times a day (TID) | INTRAVENOUS | Status: DC | PRN
  Filled 2023-02-06: qty 5

## 2023-02-06 MED ORDER — OXYCODONE HCL 5 MG PO TABS
5.0000 mg | ORAL_TABLET | Freq: Four times a day (QID) | ORAL | Status: AC | PRN
  Administered 2023-02-06 – 2023-02-08 (×4): 5 mg via ORAL
  Filled 2023-02-06 (×5): qty 1

## 2023-02-06 MED ORDER — CHLORHEXIDINE GLUCONATE 4 % EX SOLN: 60.0000 mL | Freq: Once | CUTANEOUS | Status: DC

## 2023-02-06 MED ORDER — BUPIVACAINE-EPINEPHRINE (PF) 0.5% -1:200000 IJ SOLN
INTRAMUSCULAR | Status: AC
  Filled 2023-02-06: qty 30

## 2023-02-06 MED ORDER — SODIUM CHLORIDE 0.9 % IV SOLN: INTRAVENOUS | Status: DC

## 2023-02-06 MED ORDER — TRANEXAMIC ACID-NACL 1000-0.7 MG/100ML-% IV SOLN
1000.0000 mg | INTRAVENOUS | Status: AC
  Administered 2023-02-06: 1000 mg via INTRAVENOUS

## 2023-02-06 MED ORDER — HYDROCODONE-ACETAMINOPHEN 5-325 MG PO TABS: 1.0000 | ORAL_TABLET | ORAL | 0 refills | Status: AC | PRN

## 2023-02-06 MED ORDER — CHLORHEXIDINE GLUCONATE 0.12 % MT SOLN
15.0000 mL | Freq: Once | OROMUCOSAL | Status: AC
  Administered 2023-02-06: 15 mL via OROMUCOSAL

## 2023-02-06 MED ORDER — FENTANYL CITRATE (PF) 250 MCG/5ML IJ SOLN
INTRAMUSCULAR | Status: DC | PRN
  Administered 2023-02-06 (×2): 50 ug via INTRAVENOUS

## 2023-02-06 MED ORDER — 0.9 % SODIUM CHLORIDE (POUR BTL) OPTIME
TOPICAL | Status: DC | PRN
  Administered 2023-02-06: 3000 mL

## 2023-02-06 MED ORDER — DIPHENHYDRAMINE HCL 12.5 MG/5ML PO ELIX
12.5000 mg | ORAL_SOLUTION | ORAL | Status: DC | PRN
  Administered 2023-02-07 – 2023-02-11 (×6): 25 mg via ORAL
  Filled 2023-02-06 (×6): qty 10

## 2023-02-06 MED ORDER — METHOCARBAMOL 500 MG PO TABS
500.0000 mg | ORAL_TABLET | Freq: Four times a day (QID) | ORAL | Status: DC | PRN
  Administered 2023-02-07 – 2023-02-11 (×7): 500 mg via ORAL
  Filled 2023-02-06 (×8): qty 1

## 2023-02-06 MED ORDER — POLYETHYLENE GLYCOL 3350 17 G PO PACK: 17.0000 g | PACK | Freq: Every day | ORAL | Status: DC | PRN

## 2023-02-06 MED ORDER — SENNA 8.6 MG PO TABS
1.0000 | ORAL_TABLET | Freq: Two times a day (BID) | ORAL | Status: DC
  Administered 2023-02-06 – 2023-02-11 (×9): 8.6 mg via ORAL
  Filled 2023-02-06 (×9): qty 1

## 2023-02-06 MED ORDER — DEXAMETHASONE SODIUM PHOSPHATE 10 MG/ML IJ SOLN
INTRAMUSCULAR | Status: AC
  Filled 2023-02-06: qty 1

## 2023-02-06 MED ORDER — PROPOFOL 10 MG/ML IV BOLUS
INTRAVENOUS | Status: DC | PRN
  Administered 2023-02-06: 10 mg via INTRAVENOUS
  Administered 2023-02-06: 30 mg via INTRAVENOUS

## 2023-02-06 MED ORDER — PHENYLEPHRINE 80 MCG/ML (10ML) SYRINGE FOR IV PUSH (FOR BLOOD PRESSURE SUPPORT)
PREFILLED_SYRINGE | INTRAVENOUS | Status: DC | PRN
  Administered 2023-02-06 (×2): 80 ug via INTRAVENOUS

## 2023-02-06 MED ORDER — PHENYLEPHRINE HCL-NACL 20-0.9 MG/250ML-% IV SOLN
INTRAVENOUS | Status: DC | PRN
  Administered 2023-02-06: 25 ug/min via INTRAVENOUS

## 2023-02-06 MED ORDER — DOCUSATE SODIUM 100 MG PO CAPS
100.0000 mg | ORAL_CAPSULE | Freq: Two times a day (BID) | ORAL | Status: DC
  Administered 2023-02-06 – 2023-02-11 (×8): 100 mg via ORAL
  Filled 2023-02-06 (×8): qty 1

## 2023-02-06 MED ORDER — PROMETHAZINE HCL 25 MG/ML IJ SOLN: 6.2500 mg | INTRAMUSCULAR | Status: DC | PRN

## 2023-02-06 MED ORDER — PROPOFOL 500 MG/50ML IV EMUL
INTRAVENOUS | Status: DC | PRN
  Administered 2023-02-06: 75 ug/kg/min via INTRAVENOUS

## 2023-02-06 MED ORDER — ORAL CARE MOUTH RINSE: 15.0000 mL | Freq: Once | OROMUCOSAL | Status: AC

## 2023-02-06 MED ORDER — METOCLOPRAMIDE HCL 5 MG/ML IJ SOLN: 5.0000 mg | Freq: Three times a day (TID) | INTRAMUSCULAR | Status: DC | PRN

## 2023-02-06 MED ORDER — BUPIVACAINE IN DEXTROSE 0.75-8.25 % IT SOLN
INTRATHECAL | Status: DC | PRN
  Administered 2023-02-06: 1.6 mL via INTRATHECAL

## 2023-02-06 MED ORDER — TRANEXAMIC ACID-NACL 1000-0.7 MG/100ML-% IV SOLN
INTRAVENOUS | Status: AC
  Filled 2023-02-06: qty 100

## 2023-02-06 MED ORDER — HYDROMORPHONE HCL 1 MG/ML IJ SOLN
0.5000 mg | INTRAMUSCULAR | Status: AC | PRN
  Administered 2023-02-06 – 2023-02-07 (×3): 0.5 mg via INTRAVENOUS
  Filled 2023-02-06 (×3): qty 0.5

## 2023-02-06 MED ORDER — DEXMEDETOMIDINE HCL IN NACL 80 MCG/20ML IV SOLN
INTRAVENOUS | Status: AC
  Filled 2023-02-06: qty 20

## 2023-02-06 SURGICAL SUPPLY — 67 items
ADH SKN CLS APL DERMABOND .7 (GAUZE/BANDAGES/DRESSINGS) ×2
ALCOHOL 70% 16 OZ (MISCELLANEOUS) ×2 IMPLANT
APL PRP STRL LF DISP 70% ISPRP (MISCELLANEOUS) ×1
ASMB CBL PN 914X1.8 STRL (Trauma Fixation) ×1 IMPLANT
BAG COUNTER SPONGE SURGICOUNT (BAG) ×2 IMPLANT
BAG SPNG CNTER NS LX DISP (BAG) ×1
BLADE CLIPPER SURG (BLADE) IMPLANT
CABLE READY CERCLAGE W/CRIP (Trauma Fixation) IMPLANT
CHLORAPREP W/TINT 26 (MISCELLANEOUS) ×2 IMPLANT
COVER SURGICAL LIGHT HANDLE (MISCELLANEOUS) ×2 IMPLANT
DERMABOND ADVANCED .7 DNX12 (GAUZE/BANDAGES/DRESSINGS) ×4 IMPLANT
DRAPE C-ARM 42X72 X-RAY (DRAPES) ×2 IMPLANT
DRAPE INCISE IOBAN 66X45 STRL (DRAPES) IMPLANT
DRAPE STERI IOBAN 125X83 (DRAPES) ×2 IMPLANT
DRAPE U-SHAPE 47X51 STRL (DRAPES) ×6 IMPLANT
DRSG AQUACEL AG ADV 3.5X10 (GAUZE/BANDAGES/DRESSINGS) ×2 IMPLANT
ELECT BLADE 4.0 EZ CLEAN MEGAD (MISCELLANEOUS) ×1
ELECT PENCIL ROCKER SW 15FT (MISCELLANEOUS) ×2 IMPLANT
ELECT REM PT RETURN 9FT ADLT (ELECTROSURGICAL) ×1
ELECTRODE BLDE 4.0 EZ CLN MEGD (MISCELLANEOUS) ×2 IMPLANT
ELECTRODE REM PT RTRN 9FT ADLT (ELECTROSURGICAL) ×2 IMPLANT
EVACUATOR 1/8 PVC DRAIN (DRAIN) IMPLANT
GLOVE BIO SURGEON STRL SZ8.5 (GLOVE) ×4 IMPLANT
GLOVE BIOGEL M 7.0 STRL (GLOVE) ×2 IMPLANT
GLOVE BIOGEL PI IND STRL 7.5 (GLOVE) ×2 IMPLANT
GLOVE BIOGEL PI IND STRL 8.5 (GLOVE) ×2 IMPLANT
GOWN STRL REUS W/ TWL LRG LVL3 (GOWN DISPOSABLE) ×4 IMPLANT
GOWN STRL REUS W/ TWL XL LVL3 (GOWN DISPOSABLE) ×2 IMPLANT
GOWN STRL REUS W/TWL 2XL LVL3 (GOWN DISPOSABLE) ×2 IMPLANT
GOWN STRL REUS W/TWL LRG LVL3 (GOWN DISPOSABLE) ×2
GOWN STRL REUS W/TWL XL LVL3 (GOWN DISPOSABLE) ×1
HANDPIECE INTERPULSE COAX TIP (DISPOSABLE) ×1
HEAD MOD COCR 28MM HD -3MM NK (Orthopedic Implant) IMPLANT
HOOD W/PEELAWAY (MISCELLANEOUS) ×4 IMPLANT
JET LAVAGE IRRISEPT WOUND (IRRIGATION / IRRIGATOR) ×1
KIT BASIN OR (CUSTOM PROCEDURE TRAY) ×2 IMPLANT
KIT TURNOVER KIT B (KITS) ×2 IMPLANT
LAVAGE JET IRRISEPT WOUND (IRRIGATION / IRRIGATOR) ×2 IMPLANT
MANIFOLD NEPTUNE II (INSTRUMENTS) ×2 IMPLANT
MARKER SKIN DUAL TIP RULER LAB (MISCELLANEOUS) ×4 IMPLANT
NDL SPNL 18GX3.5 QUINCKE PK (NEEDLE) ×2 IMPLANT
NEEDLE SPNL 18GX3.5 QUINCKE PK (NEEDLE) ×1 IMPLANT
NS IRRIG 1000ML POUR BTL (IV SOLUTION) ×2 IMPLANT
PACK TOTAL JOINT (CUSTOM PROCEDURE TRAY) ×2 IMPLANT
PACK UNIVERSAL I (CUSTOM PROCEDURE TRAY) ×2 IMPLANT
PAD ARMBOARD 7.5X6 YLW CONV (MISCELLANEOUS) ×4 IMPLANT
RINGBLOC BI POLAR 28X47MM (Orthopedic Implant) ×1 IMPLANT
SAW OSC TIP CART 19.5X105X1.3 (SAW) ×2 IMPLANT
SEALER BIPOLAR AQUA 6.0 (INSTRUMENTS) IMPLANT
SET HNDPC FAN SPRY TIP SCT (DISPOSABLE) ×2 IMPLANT
SHELL RINGBLOC BI POLR 28X47MM (Orthopedic Implant) IMPLANT
SOL PREP POV-IOD 4OZ 10% (MISCELLANEOUS) ×2 IMPLANT
STEM FEM HO ARCOS 15X175 (Stem) IMPLANT
SUT ETHIBOND NAB CT1 #1 30IN (SUTURE) IMPLANT
SUT MNCRL AB 3-0 PS2 18 (SUTURE) IMPLANT
SUT MON AB 2-0 CT1 36 (SUTURE) ×2 IMPLANT
SUT VIC AB 2-0 CT1 27 (SUTURE) ×1
SUT VIC AB 2-0 CT1 TAPERPNT 27 (SUTURE) ×2 IMPLANT
SUT VLOC 180 0 24IN GS25 (SUTURE) ×2 IMPLANT
SYR 50ML LL SCALE MARK (SYRINGE) ×2 IMPLANT
TOWEL GREEN STERILE (TOWEL DISPOSABLE) ×2 IMPLANT
TOWEL GREEN STERILE FF (TOWEL DISPOSABLE) ×2 IMPLANT
TRAY CATH INTERMITTENT SS 16FR (CATHETERS) IMPLANT
TRAY FOLEY W/BAG SLVR 16FR (SET/KITS/TRAYS/PACK)
TRAY FOLEY W/BAG SLVR 16FR ST (SET/KITS/TRAYS/PACK) IMPLANT
TUBE SUCT ARGYLE STRL (TUBING) ×2 IMPLANT
WATER STERILE IRR 1000ML POUR (IV SOLUTION) ×6 IMPLANT

## 2023-02-06 NOTE — Interval H&P Note (Signed)
History and Physical Interval Note:  02/06/2023 12:15 PM  Ariel Lambert  has presented today for surgery, with the diagnosis of RIGHT FEMOLRAL NECK FRACTURE.  The various methods of treatment have been discussed with the patient and family. After consideration of risks, benefits and other options for treatment, the patient has consented to  Procedure(s): ANTERIOR TOTAL HIP ARTHROPLASTY (Right) as a surgical intervention.  The patient's history has been reviewed, patient examined, no change in status, stable for surgery.  I have reviewed the patient's chart and labs.  Questions were answered to the patient's satisfaction.     Iline Oven Innocence Schlotzhauer

## 2023-02-06 NOTE — Progress Notes (Signed)
Patient woke up crying in pain. PRN dilaudid administered for breakthrough, order d/c'd post-administration. Contacted provider for additional orders as patient was able to get a few hours of rest after receiving previous dose; see new orders.

## 2023-02-06 NOTE — Anesthesia Postprocedure Evaluation (Signed)
Anesthesia Post Note  Patient: Ariel Lambert  Procedure(s) Performed: ANTERIOR TOTAL HIP ARTHROPLASTY (Right: Hip)     Patient location during evaluation: PACU Anesthesia Type: MAC and Spinal Level of consciousness: awake and alert Pain management: pain level controlled Vital Signs Assessment: post-procedure vital signs reviewed and stable Respiratory status: spontaneous breathing and respiratory function stable Cardiovascular status: blood pressure returned to baseline and stable Postop Assessment: spinal receding Anesthetic complications: no   No notable events documented.  Last Vitals:  Vitals:   02/06/23 1600 02/06/23 1615  BP: 122/62 107/67  Pulse: 65   Resp: 16 15  Temp:    SpO2: 95% 94%    Last Pain:  Vitals:   02/06/23 0805  TempSrc: Oral  PainSc: 8                  Elize Pinon DANIEL

## 2023-02-06 NOTE — Anesthesia Procedure Notes (Signed)
Procedure Name: MAC Date/Time: 02/06/2023 12:44 PM  Performed by: Garfield Cornea, CRNAPre-anesthesia Checklist: Patient identified, Emergency Drugs available, Suction available and Patient being monitored Patient Re-evaluated:Patient Re-evaluated prior to induction Oxygen Delivery Method: Simple face mask Dental Injury: Teeth and Oropharynx as per pre-operative assessment

## 2023-02-06 NOTE — Progress Notes (Addendum)
PROGRESS NOTE    Ariel Lambert  RUE:[id number] DOB: 1936/06/25 DOA: 02/05/2023 PCP: Mila Palmer, MD   Chief Complaint  Patient presents with   Fall    Brief Narrative:  Patient is a pleasant 87 year old female history of hypertension, hyperlipidemia, osteoporosis, hypothyroidism, carotid artery stenosis status post carotid endarterectomy, asthma presented to the ED after mechanical fall.  Plain films of the right hip and pelvis reviewed mild displaced subcapital femoral fracture on the right.  Patient seen in consultation by orthopedics and patient for probable surgical repair 02/06/2023.   Assessment & Plan:   Principal Problem:   S/P right hip fracture Active Problems:   Leukocytosis   Acute hypoxic respiratory failure (HCC)   Hyperthyroidism   Hypertension   Chronic obstructive pulmonary disease (HCC)  #1 right hip fracture -Second due to mechanical fall -Patient seen in consultation by orthopedics and patient for surgical repair 02/06/2023. -Continue IV fluids, IV antiemetics. -Continue scheduled Tylenol 1000 mg 3 times daily, IV Dilaudid as needed, oxycodone as needed. -Add Robaxin as needed. -Supportive care. -Postop DVT prophylaxis and pain management per orthopedics.  2.  Acute hypoxic respiratory failure in the setting of opioid use -Patient noted not to be on oxygen supplementation at baseline. -Patient to have received fentanyl in the ED leading to hypoxia and patient maintained on nasal cannula. -Chest x-ray ordered, negative for any acute infiltrate or pulmonary edema. -Continue pulmonary toileting with incentive spirometry. -Flutter valve. -Continue bronchodilators.  3.  Leukocytosis -Likely reactive leukocytosis secondary to problem #1. -Chest x-ray obtained with no acute infiltrate.  -Urinalysis nitrite negative, leukocytes negative. -Patient afebrile. -Leukocytosis trended down.  4.  Hyperthyroidism -Continue Tapazole.  5.   COPD -Stable. -Continue Dulera, Incruse, Claritin.   -Xopenex nebs as needed.  6.  Hypertension -Patient started on decreased dose of Avapro. -BP soft/borderline as such Avapro discontinued.  -IV hydralazine.  7.  Dehydration -IV fluids.     DVT prophylaxis: Postop per orthopedics. Code Status: Full Family Communication: Updated patient and daughter at bedside.  Disposition: TBD  Status is: Inpatient Remains inpatient appropriate because: Severity of illness   Consultants:  Orthopedics: Andrez Grime, PA 02/05/2023  Procedures:  Plain films right knee 02/05/2023 Chest x-ray 02/05/2023 Plain films of the right hip and pelvis 02/05/2023  Antimicrobials:  Anti-infectives (From admission, onward)    None         Subjective: Sleeping but easily arousable.  States not doing well today.  Complaining of right hip pain, just received some pain medications.  No chest pain.  No shortness of breath.  No dysuria.  Daughter at bedside.   Objective: Vitals:   02/05/23 1455 02/05/23 2101 02/06/23 0500 02/06/23 0805  BP: 91/62 132/74 129/75 135/69  Pulse: 64  68 65  Resp: 17 14 13 15   Temp: 98.4 F (36.9 C)  98.8 F (37.1 C) 98.4 F (36.9 C)  TempSrc: Oral Oral Oral Oral  SpO2: 93% 93% 98% 94%  Weight:      Height:        Intake/Output Summary (Last 24 hours) at 02/06/2023 0925 Last data filed at 02/06/2023 0000 Gross per 24 hour  Intake 1916.25 ml  Output 1350 ml  Net 566.25 ml    Filed Weights   02/05/23 0332  Weight: 59.3 kg    Examination:  General exam: NAD.  Extremely dry mucous membranes.   Respiratory system: CTAB anterior lung fields.  No wheezes, no crackles, no rhonchi.  Fair air movement.  Normal respiratory  effort.   Cardiovascular system: RRR no murmurs rubs or gallops.  No JVD.  No lower extremity edema.  Gastrointestinal system: Abdomen is soft, nontender, nondistended, positive bowel sounds.  No rebound.  No guarding.  Central nervous system:  Alert and oriented. No focal neurological deficits. Extremities: RLE shortened and externally rotated. Skin: No rashes, lesions or ulcers Psychiatry: Judgement and insight appear normal. Mood & affect appropriate.     Data Reviewed: I have personally reviewed following labs and imaging studies  CBC: Recent Labs  Lab 02/05/23 0205 02/06/23 0351  WBC 12.6* 8.4  HGB 12.7 10.5*  HCT 39.3 32.3*  MCV 101.3* 102.5*  PLT 202 162     Basic Metabolic Panel: Recent Labs  Lab 02/05/23 0205 02/06/23 0351  NA 136 134*  K 3.8 3.9  CL 100 105  CO2 23 22  GLUCOSE 121* 112*  BUN 12 12  CREATININE 0.58 0.59  CALCIUM 8.6* 7.7*     GFR: Estimated Creatinine Clearance: 42.5 mL/min (by C-G formula based on SCr of 0.59 mg/dL).  Liver Function Tests: No results for input(s): "AST", "ALT", "ALKPHOS", "BILITOT", "PROT", "ALBUMIN" in the last 168 hours.  CBG: No results for input(s): "GLUCAP" in the last 168 hours.   No results found for this or any previous visit (from the past 240 hour(s)).       Radiology Studies: DG Knee Right Port  Result Date: 02/05/2023 CLINICAL DATA:  [id number] Closed displaced fracture of right femoral neck (HCC) [id number] EXAM: PORTABLE RIGHT KNEE - 1-2 VIEW COMPARISON:  None Available. FINDINGS: No fracture or dislocation. No effusion. Marked narrowing of articular cartilage in the lateral compartment with near bone on bone apposition and marginal spurring. Small medial spurs are also noted. Patchy arterial calcifications. Soft tissues otherwise unremarkable. IMPRESSION: 1. No acute findings. 2. Advanced lateral compartment DJD. Electronically Signed   By: Corlis Leak M.D.   On: 02/05/2023 10:38   DG CHEST PORT 1 VIEW  Result Date: 02/05/2023 CLINICAL DATA:  [id number] Hypoxia [id number] EXAM: PORTABLE CHEST 1 VIEW COMPARISON:  CXR 10/31/21 FINDINGS: No pleural effusion. No pneumothorax. No focal airspace opacity. Normal cardiac and mediastinal contours. No  radiographically apparent displaced rib fractures. Visualized upper abdomen is unremarkable. IMPRESSION: No focal airspace opacity. Electronically Signed   By: Lorenza Cambridge M.D.   On: 02/05/2023 09:05   DG Hip Unilat  With Pelvis 2-3 Views Right  Result Date: 02/05/2023 CLINICAL DATA:  Hip pain. EXAM: DG HIP (WITH OR WITHOUT PELVIS) 2-3V RIGHT COMPARISON:  None Available. FINDINGS: There is a subcapital fracture of the proximal right femur with mild superior subluxation of the distal fracture fragment. No dislocation. Moderate degenerative changes are noted at the right hip and mild degenerative changes are present at the left hip. Vascular calcifications are noted in the lower extremities bilaterally. Degenerative changes are noted in the lower lumbar spine. IMPRESSION: Mildly displaced subcapital femoral fracture on the right. Electronically Signed   By: Thornell Sartorius M.D.   On: 02/05/2023 02:30        Scheduled Meds:  acetaminophen  1,000 mg Oral TID   aspirin EC  81 mg Oral Daily   azelastine  1 spray Each Nare QPM   ezetimibe  10 mg Oral q1800   And   simvastatin  20 mg Oral q1800   fluticasone  2 spray Each Nare Daily   loratadine  10 mg Oral Daily   magnesium gluconate  500 mg Oral Daily   methimazole  7.5 mg Oral QODAY   mometasone-formoterol  2 puff Inhalation BID   senna-docusate  1 tablet Oral BID   umeclidinium bromide  1 puff Inhalation Daily   Continuous Infusions:  lactated ringers       LOS: 1 day    Time spent: 35 minutes    Ramiro Harvest, MD [facility]   To contact the attending provider between 7A-7P or the covering provider during after hours 7P-7A, please log into the web site [email or web address] and access using universal [place] password for that web site. If you do not have the password, please call the hospital operator.  02/06/2023, 9:25 AM

## 2023-02-06 NOTE — Anesthesia Procedure Notes (Signed)
Spinal  Patient location during procedure: OR Start time: 02/06/2023 12:48 PM End time: 02/06/2023 12:53 PM Reason for block: surgical anesthesia Staffing Performed: anesthesiologist  Anesthesiologist: Mal Amabile, MD Performed by: Mal Amabile, MD Authorized by: Mal Amabile, MD   Preanesthetic Checklist Completed: patient identified, IV checked, site marked, risks and benefits discussed, surgical consent, monitors and equipment checked, pre-op evaluation and timeout performed Spinal Block Patient position: right lateral decubitus Prep: DuraPrep and site prepped and draped Patient monitoring: heart rate, cardiac monitor, continuous pulse ox and blood pressure Approach: midline Location: L3-4 Injection technique: single-shot Needle Needle type: Pencan  Needle gauge: 24 G Needle length: 9 cm Needle insertion depth: 6 cm Assessment Sensory level: T8 Events: CSF return Additional Notes Patient tolerated procedure well. Adequate sensory level.

## 2023-02-06 NOTE — Anesthesia Preprocedure Evaluation (Addendum)
Anesthesia Evaluation  Patient identified by MRN, date of birth, ID band Patient awake    Reviewed: Allergy & Precautions, H&P , NPO status , Patient's Chart, lab work & pertinent test results, reviewed documented beta blocker date and time   History of Anesthesia Complications Negative for: history of anesthetic complications  Airway Mallampati: II  TM Distance: >3 FB Neck ROM: Full    Dental  (+) Caps, Dental Advisory Given, Teeth Intact   Pulmonary asthma , COPD,  COPD inhaler, former smoker   Pulmonary exam normal        Cardiovascular hypertension, Pt. on medications  Rhythm:Regular Rate:Normal  EKG 10/30/21 NSR, LAFB, LVH  Echo 10/08/20 1. Left ventricular ejection fraction, by estimation, is 60 to 65%. The  left ventricle has normal function. The left ventricle has no regional  wall motion abnormalities. There is mild concentric left ventricular  hypertrophy. Left ventricular diastolic  parameters are consistent with Grade I diastolic dysfunction (impaired  relaxation). Elevated left atrial pressure.   2. Right ventricular systolic function is normal. The right ventricular  size is normal. There is normal pulmonary artery systolic pressure. The  estimated right ventricular systolic pressure is 29.0 mmHg.   3. Left atrial size was moderately dilated.   4. The mitral valve is normal in structure. Mild mitral valve  regurgitation. No evidence of mitral stenosis. Moderate mitral annular  calcification.   5. The aortic valve is normal in structure. There is moderate  calcification of the aortic valve. There is moderate thickening of the  aortic valve. Aortic valve regurgitation is trivial. Mild to moderate  aortic valve sclerosis/calcification is present,  without any evidence of aortic stenosis.   6. Aortic dilatation noted. There is borderline dilatation of the  ascending aorta, measuring 40 mm.   7. The inferior vena  cava is normal in size with greater than 50%  respiratory variability, suggesting right atrial pressure of 3 mmHg.     Neuro/Psych  Headaches PSYCHIATRIC DISORDERS  Depression    CVA    GI/Hepatic negative GI ROS, Neg liver ROS,,,  Endo/Other   Hyperthyroidism   Renal/GU Renal disease  negative genitourinary   Musculoskeletal  (+) Arthritis , Osteoarthritis,  Right Femoral neck Fx   Abdominal   Peds  Hematology  (+) Blood dyscrasia, anemia   Anesthesia Other Findings   Reproductive/Obstetrics negative OB ROS                              Anesthesia Physical Anesthesia Plan  ASA: 3  Anesthesia Plan: Spinal and MAC   Post-op Pain Management: Tylenol PO (pre-op)*   Induction: Intravenous  PONV Risk Score and Plan: 3 and Propofol infusion, Ondansetron and Treatment may vary due to age or medical condition  Airway Management Planned: Natural Airway and Simple Face Mask  Additional Equipment: None  Intra-op Plan:   Post-operative Plan: Extubation in OR  Informed Consent: I have reviewed the patients History and Physical, chart, labs and discussed the procedure including the risks, benefits and alternatives for the proposed anesthesia with the patient or authorized representative who has indicated his/her understanding and acceptance.     Dental advisory given  Plan Discussed with: Anesthesiologist and CRNA  Anesthesia Plan Comments: (See PAT note 09/18/2021, Jodell Cipro Ward, PA-C)         Anesthesia Quick Evaluation

## 2023-02-06 NOTE — Op Note (Signed)
OPERATIVE REPORT  SURGEON: Samson Frederic, MD   ASSISTANT: Clint Bolder, PA-C  PREOPERATIVE DIAGNOSIS: Displaced Right femoral neck fracture.   POSTOPERATIVE DIAGNOSIS: Displaced Right femoral neck fracture.   PROCEDURE: Right hip hemiarthroplasty, anterior approach.   IMPLANTS: [employer] Arcos One-Piece high offset broach body stem, size 15 x 175 mm, with a 28 x 47 mm bipolar head ball with 28 - 3 mm metal head ball. 1.8 mm adult reconstruction cable x1.  ANESTHESIA:  MAC and Spinal  ANTIBIOTICS: 2g ancef.  ESTIMATED BLOOD LOSS:-300 mL    DRAINS: None.  COMPLICATIONS: None   CONDITION: PACU - hemodynamically stable.   BRIEF CLINICAL NOTE: Ariel Lambert is a 87 y.o. female with a displaced Right femoral neck fracture. The patient was admitted to the hospitalist service and underwent perioperative risk stratification and medical optimization. The risks, benefits, and alternatives to hemiarthroplasty were explained, and the patient elected to proceed.  PROCEDURE IN DETAIL: The patient was taken to the operating room and general anesthesia was induced on the hospital bed.  The patient was then positioned on the Hana table.  All bony prominences were well padded.  The hip was prepped and draped in the normal sterile surgical fashion.  A time-out was called verifying side and site of surgery. Antibiotics were given within 60 minutes of beginning the procedure.   Bikini incision was made, and the direct anterior approach to the hip was performed through the Hueter interval.  Superficial dissection was carried out lateral to the ASIS. Lateral femoral circumflex vessels were treated with the Auqumantys. The anterior capsule was exposed and an inverted T capsulotomy was made. Fracture hematoma was encountered and evacuated. The patient was found to have a comminuted Right subcapital femoral neck fracture with medial and posterior extension to the intertrochanteric region.  The posterior  femoral neck was also fractured and not continuous with the shaft.  Inferior pubofemoral ligament was released subperiosteally to the lesser trochanter. I freshened the femoral neck cut with a saw.  I removed the femoral neck fragment.  A corkscrew was placed into the head and the head was removed.  This was passed to the back table and was measured.  Due to the distal extension of her femoral neck fracture, I placed a single prophylactic adult reconstruction cable just distal to the lesser trochanter.   Acetabular exposure was achieved.  I examined the articular cartilage which was intact.  The labrum was intact. A 47 mm trial head was placed and found to have excellent fit.   I then gained femoral exposure taking care to protect the abductors and greater trochanter.  The superior capsule was incised longitudinally, staying lateral to the posterior border of the femoral neck. External rotation, extension, and adduction were applied.  A cookie cutter was used to enter the femoral canal, and then the femoral canal finder was used to confirm location.  I then sequentially reamed and broached up to a size 15.  Calcar planer was used on the femoral neck remnant.  I placed a high offset neck and a 47 mm bipolar trial construct. The hip was reduced.  Leg lengths were checked fluoroscopically.  The hip was dislocated and trial components were removed.  I placed the real stem followed by the real bipolar articulation.  A single reduction maneuver was performed and the hip was reduced.  Fluoroscopy was used to confirm component position and leg lengths.  At 90 degrees of external rotation and extension, the hip was stable to  an anterior directed force.   The wound was copiously irrigated with Irrisept solution and normal saline using pulse lavage.  Marcaine solution was injected into the periarticular soft tissue.  The wound was closed in layers using #1 Stratafix for the fascia, 2-0 Vicryl for the subcutaneous fat,  2-0 Monocryl for the deep dermal layer, and staples plus Dermabond for the skin.  Once the glue was fully dried, an Aquacell Ag dressing was applied.  The patient was then awakened from anesthesia and transported to the recovery room in stable condition.  Sponge, needle, and instrument counts were correct at the end of the case x2.  The patient tolerated the procedure well and there were no known complications.  The aquamantis was utilized for this case to help facilitate better hemostasis as patient was felt to be at increased risk of bleeding because of complex case requiring increased OR time and/or exposure.  A oscillating saw tip was utilized for this case to prevent damage to the soft tissue structures such as muscles, ligaments and tendons, and to ensure accurate bone cuts. This patient was at increased risk for above structures due to  minimally invasive approach.  Please note that a surgical assistant was a medical necessity for this procedure to perform it in a safe and expeditious manner. Assistant was necessary to provide appropriate retraction of vital neurovascular structures, to prevent femoral fracture, and to allow for anatomic placement of the prosthesis.

## 2023-02-06 NOTE — Transfer of Care (Signed)
Immediate Anesthesia Transfer of Care Note  Patient: Ariel Lambert  Procedure(s) Performed: ANTERIOR TOTAL HIP ARTHROPLASTY (Right: Hip)  Patient Location: PACU  Anesthesia Type:MAC and Spinal  Level of Consciousness: drowsy  Airway & Oxygen Therapy: Patient Spontanous Breathing and Patient connected to face mask oxygen  Post-op Assessment: Report given to RN and Post -op Vital signs reviewed and stable  Post vital signs: Reviewed and stable  Last Vitals:  Vitals Value Taken Time  BP 121/62 02/06/23 1532  Temp    Pulse 68 02/06/23 1534  Resp 18 02/06/23 1534  SpO2 99 % 02/06/23 1534  Vitals shown include unvalidated device data.  Last Pain:  Vitals:   02/06/23 0805  TempSrc: Oral  PainSc: 8       Patients Stated Pain Goal: 0 (02/05/23 2101)  Complications: No notable events documented.

## 2023-02-06 NOTE — Discharge Instructions (Signed)
? ?Dr. Brian Swinteck ?Joint Replacement Specialist ?[facility] ?[address] ?[place], [place] [address] ?[phone] ? ? ?TOTAL HIP REPLACEMENT POSTOPERATIVE DIRECTIONS ? ? ? ?Hip Rehabilitation, Guidelines Following Surgery  ? ?WEIGHT BEARING ?Weight bearing as tolerated with assist device (walker, cane, etc) as directed, use it as long as suggested by your surgeon or therapist, typically at least 4-6 weeks. ? ?The results of a hip operation are greatly improved after range of motion and muscle strengthening exercises. Follow all safety measures which are given to protect your hip. If any of these exercises cause increased pain or swelling in your joint, decrease the amount until you are comfortable again. Then slowly increase the exercises. Call your caregiver if you have problems or questions.  ? ?HOME CARE INSTRUCTIONS  ?Most of the following instructions are designed to prevent the dislocation of your new hip.  ?Remove items at home which could result in a fall. This includes throw rugs or furniture in walking pathways.  ?Continue medications as instructed at time of discharge. ?You may have some home medications which will be placed on hold until you complete the course of blood thinner medication. ?You may start showering once you are discharged home. Do not remove your dressing. ?Do not put on socks or shoes without following the instructions of your caregivers.   ?Sit on chairs with arms. Use the chair arms to help push yourself up when arising.  ?Arrange for the use of a toilet seat elevator so you are not sitting low.  ?Walk with walker as instructed.  ?You may resume a sexual relationship in one month or when given the OK by your caregiver.  ?Use walker as long as suggested by your caregivers.  ?You may put full weight on your legs and walk as much as is comfortable. ?Avoid periods of inactivity such as sitting longer than an hour when not asleep. This helps prevent blood  clots.  ?You may return to work once you are cleared by your surgeon.  ?Do not drive a car for 6 weeks or until released by your surgeon.  ?Do not drive while taking narcotics.  ?Wear elastic stockings for two weeks following surgery during the day but you may remove then at night.  ?Make sure you keep all of your appointments after your operation with all of your doctors and caregivers. You should call the office at the above phone number and make an appointment for approximately two weeks after the date of your surgery. ?Please pick up a stool softener and laxative for home use as long as you are requiring pain medications. ?ICE to the affected hip every three hours for 30 minutes at a time and then as needed for pain and swelling. Continue to use ice on the hip for pain and swelling from surgery. You may notice swelling that will progress down to the foot and ankle.  This is normal after surgery.  Elevate the leg when you are not up walking on it.   ?It is important for you to complete the blood thinner medication as prescribed by your doctor. ?Continue to use the breathing machine which will help keep your temperature down.  It is common for your temperature to cycle up and down following surgery, especially at night when you are not up moving around and exerting yourself.  The breathing machine keeps your lungs expanded and your temperature down. ? ?RANGE OF MOTION AND STRENGTHENING EXERCISES  ?These exercises are designed to help you   keep full movement of your hip joint. Follow your caregiver's or physical therapist's instructions. Perform all exercises about fifteen times, three times per day or as directed. Exercise both hips, even if you have had only one joint replacement. These exercises can be done on a training (exercise) mat, on the floor, on a table or on a bed. Use whatever works the best and is most comfortable for you. Use music or television while you are exercising so that the exercises are a  pleasant break in your day. This will make your life better with the exercises acting as a break in routine you can look forward to.  ?Lying on your back, slowly slide your foot toward your buttocks, raising your knee up off the floor. Then slowly slide your foot back down until your leg is straight again.  ?Lying on your back spread your legs as far apart as you can without causing discomfort.  ?Lying on your side, raise your upper leg and foot straight up from the floor as far as is comfortable. Slowly lower the leg and repeat.  ?Lying on your back, tighten up the muscle in the front of your thigh (quadriceps muscles). You can do this by keeping your leg straight and trying to raise your heel off the floor. This helps strengthen the largest muscle supporting your knee.  ?Lying on your back, tighten up the muscles of your buttocks both with the legs straight and with the knee bent at a comfortable angle while keeping your heel on the floor.  ? ?SKILLED REHAB INSTRUCTIONS: ?If the patient is transferred to a skilled rehab facility following release from the hospital, a list of the current medications will be sent to the facility for the patient to continue.  When discharged from the skilled rehab facility, please have the facility set up the patient's Home Health Physical Therapy prior to being released. Also, the skilled facility will be responsible for providing the patient with their medications at time of release from the facility to include their pain medication and their blood thinner medication. If the patient is still at the rehab facility at time of the two week follow up appointment, the skilled rehab facility will also need to assist the patient in arranging follow up appointment in our office and any transportation needs. ? ?POST-OPERATIVE OPIOID TAPER INSTRUCTIONS: ?It is important to wean off of your opioid medication as soon as possible. If you do not need pain medication after your surgery it is ok  to stop day one. ?Opioids include: ?Codeine, Hydrocodone(Norco, Vicodin), Oxycodone(Percocet, oxycontin) and hydromorphone amongst others.  ?Long term and even short term use of opiods can cause: ?Increased pain response ?Dependence ?Constipation ?Depression ?Respiratory depression ?And more.  ?Withdrawal symptoms can include ?Flu like symptoms ?Nausea, vomiting ?And more ?Techniques to manage these symptoms ?Hydrate well ?Eat regular healthy meals ?Stay active ?Use relaxation techniques(deep breathing, meditating, yoga) ?Do Not substitute Alcohol to help with tapering ?If you have been on opioids for less than two weeks and do not have pain than it is ok to stop all together.  ?Plan to wean off of opioids ?This plan should start within one week post op of your joint replacement. ?Maintain the same interval or time between taking each dose and first decrease the dose.  ?Cut the total daily intake of opioids by one tablet each day ?Next start to increase the time between doses. ?The last dose that should be eliminated is the evening dose.  ? ? ?MAKE   SURE YOU:  ?Understand these instructions.  ?Will watch your condition.  ?Will get help right away if you are not doing well or get worse. ? ?Pick up stool softner and laxative for home use following surgery while on pain medications. ?Do not remove your dressing. ?The dressing is waterproof--it is OK to take showers. ?Continue to use ice for pain and swelling after surgery. ?Do not use any lotions or creams on the incision until instructed by your surgeon. ?Total Hip Protocol. ? ?

## 2023-02-07 ENCOUNTER — Inpatient Hospital Stay (HOSPITAL_COMMUNITY): Payer: Medicare PPO

## 2023-02-07 DIAGNOSIS — Z8781 Personal history of (healed) traumatic fracture: Secondary | ICD-10-CM

## 2023-02-07 DIAGNOSIS — I10 Essential (primary) hypertension: Secondary | ICD-10-CM | POA: Diagnosis not present

## 2023-02-07 DIAGNOSIS — J9601 Acute respiratory failure with hypoxia: Secondary | ICD-10-CM

## 2023-02-07 DIAGNOSIS — J449 Chronic obstructive pulmonary disease, unspecified: Secondary | ICD-10-CM | POA: Diagnosis not present

## 2023-02-07 DIAGNOSIS — R509 Fever, unspecified: Secondary | ICD-10-CM | POA: Diagnosis not present

## 2023-02-07 DIAGNOSIS — D72829 Elevated white blood cell count, unspecified: Secondary | ICD-10-CM

## 2023-02-07 DIAGNOSIS — K5900 Constipation, unspecified: Secondary | ICD-10-CM

## 2023-02-07 DIAGNOSIS — E0590 Thyrotoxicosis, unspecified without thyrotoxic crisis or storm: Secondary | ICD-10-CM

## 2023-02-07 LAB — BASIC METABOLIC PANEL
Anion gap: 10 (ref 5–15)
BUN: 8 mg/dL (ref 8–23)
CO2: 20 mmol/L — ABNORMAL LOW (ref 22–32)
Calcium: 7.4 mg/dL — ABNORMAL LOW (ref 8.9–10.3)
Chloride: 100 mmol/L (ref 98–111)
Creatinine, Ser: 0.62 mg/dL (ref 0.44–1.00)
GFR, Estimated: >60 mL/min (ref >60)
Glucose, Bld: 99 mg/dL (ref 70–99)
Potassium: 3.5 mmol/L (ref 3.5–5.1)
Sodium: 130 mmol/L — ABNORMAL LOW (ref 135–145)

## 2023-02-07 LAB — CBC WITH DIFFERENTIAL/PLATELET
Abs Immature Granulocytes: 0.03 10*3/uL (ref 0.00–0.07)
Abs Immature Granulocytes: 0.05 10*3/uL (ref 0.00–0.07)
Basophils Absolute: 0 10*3/uL (ref 0.0–0.1)
Basophils Absolute: 0 10*3/uL (ref 0.0–0.1)
Basophils Relative: 0 %
Basophils Relative: 0 %
Eosinophils Absolute: 0 10*3/uL (ref 0.0–0.5)
Eosinophils Absolute: 0.1 10*3/uL (ref 0.0–0.5)
Eosinophils Relative: 0 %
Eosinophils Relative: 1 %
HCT: 28.2 % — ABNORMAL LOW (ref 36.0–46.0)
HCT: 26.7 % — ABNORMAL LOW (ref 36.0–46.0)
Hemoglobin: 8.9 g/dL — ABNORMAL LOW (ref 12.0–15.0)
Hemoglobin: 8.8 g/dL — ABNORMAL LOW (ref 12.0–15.0)
Immature Granulocytes: 0 %
Immature Granulocytes: 0 %
Lymphocytes Relative: 7 %
Lymphocytes Relative: 6 %
Lymphs Abs: 0.7 10*3/uL (ref 0.7–4.0)
Lymphs Abs: 0.7 10*3/uL (ref 0.7–4.0)
MCH: 32.7 pg (ref 26.0–34.0)
MCH: 33 pg (ref 26.0–34.0)
MCHC: 31.6 g/dL (ref 30.0–36.0)
MCHC: 33 g/dL (ref 30.0–36.0)
MCV: 103.7 fL — ABNORMAL HIGH (ref 80.0–100.0)
MCV: 100 fL (ref 80.0–100.0)
Monocytes Absolute: 0.6 10*3/uL (ref 0.1–1.0)
Monocytes Absolute: 0.8 10*3/uL (ref 0.1–1.0)
Monocytes Relative: 6 %
Monocytes Relative: 7 %
Neutro Abs: 7.7 10*3/uL (ref 1.7–7.7)
Neutro Abs: 10.1 10*3/uL — ABNORMAL HIGH (ref 1.7–7.7)
Neutrophils Relative %: 87 %
Neutrophils Relative %: 86 %
Platelets: 122 10*3/uL — ABNORMAL LOW (ref 150–400)
Platelets: 128 10*3/uL — ABNORMAL LOW (ref 150–400)
RBC: 2.72 MIL/uL — ABNORMAL LOW (ref 3.87–5.11)
RBC: 2.67 MIL/uL — ABNORMAL LOW (ref 3.87–5.11)
RDW: 13.8 % (ref 11.5–15.5)
RDW: 13.6 % (ref 11.5–15.5)
WBC: 9 10*3/uL (ref 4.0–10.5)
WBC: 11.8 10*3/uL — ABNORMAL HIGH (ref 4.0–10.5)
nRBC: 0 % (ref 0.0–0.2)
nRBC: 0 % (ref 0.0–0.2)

## 2023-02-07 LAB — URINALYSIS, ROUTINE W REFLEX MICROSCOPIC
Bilirubin Urine: NEGATIVE
Glucose, UA: NEGATIVE mg/dL
Hgb urine dipstick: NEGATIVE
Ketones, ur: 20 mg/dL — AB
Leukocytes,Ua: NEGATIVE
Nitrite: NEGATIVE
Protein, ur: NEGATIVE mg/dL
Specific Gravity, Urine: 1.004 — ABNORMAL LOW (ref 1.005–1.030)
pH: 6 (ref 5.0–8.0)

## 2023-02-07 LAB — VITAMIN B12: Vitamin B-12: 228 pg/mL (ref 180–914)

## 2023-02-07 LAB — FOLATE: Folate: 29.9 ng/mL (ref >5.9)

## 2023-02-07 LAB — IRON AND TIBC
Iron: 9 ug/dL — ABNORMAL LOW (ref 28–170)
Saturation Ratios: 5 % — ABNORMAL LOW (ref 10.4–31.8)
TIBC: 175 ug/dL — ABNORMAL LOW (ref 250–450)
UIBC: 166 ug/dL

## 2023-02-07 LAB — FERRITIN: Ferritin: 141 ng/mL (ref 11–307)

## 2023-02-07 MED ORDER — BISACODYL 10 MG RE SUPP
10.0000 mg | Freq: Once | RECTAL | Status: DC
  Filled 2023-02-07: qty 1

## 2023-02-07 MED ORDER — ACETAMINOPHEN 325 MG PO TABS
650.0000 mg | ORAL_TABLET | ORAL | Status: DC | PRN
  Administered 2023-02-07 – 2023-02-10 (×7): 650 mg via ORAL
  Filled 2023-02-07 (×7): qty 2

## 2023-02-07 MED ORDER — SODIUM CHLORIDE 0.9 % IV SOLN: INTRAVENOUS | Status: DC

## 2023-02-07 MED ORDER — POLYETHYLENE GLYCOL 3350 17 G PO PACK
17.0000 g | PACK | Freq: Two times a day (BID) | ORAL | Status: DC
  Administered 2023-02-07 – 2023-02-11 (×7): 17 g via ORAL
  Filled 2023-02-07 (×7): qty 1

## 2023-02-07 NOTE — Progress Notes (Signed)
   Subjective: 1 Day Post-Op Procedure(s) (LRB): ANTERIOR TOTAL HIP ARTHROPLASTY (Right)  Pt lying in bed c/o feeling warm, temp 103 currently Mild to moderate soreness to right hip Otherwise doing fair Patient reports pain as moderate.  Objective:   VITALS:   Vitals:   02/06/23 2040 02/07/23 0005  BP: 125/74 137/83  Pulse:    Resp: 18 17  Temp: 98.3 F (36.8 C) 98.3 F (36.8 C)  SpO2: 98% 98%    Right hip incision healing well Dressing intact Nv intact distally Moderate temp difference from upper extremities into bilateral lower extremities - increased warmth to bilateral lower legs No drainage   LABS Recent Labs    02/05/23 0205 02/06/23 0351 02/07/23 0207  HGB 12.7 10.5* 8.9*  HCT 39.3 32.3* 28.2*  WBC 12.6* 8.4 9.0  PLT 202 162 122*    Recent Labs    02/05/23 0205 02/06/23 0351 02/07/23 0207  NA 136 134* 130*  K 3.8 3.9 3.5  BUN 12 12 8   CREATININE 0.58 0.59 0.62  GLUCOSE 121* 112* 99     Assessment/Plan: 1 Day Post-Op Procedure(s) (LRB): ANTERIOR TOTAL HIP ARTHROPLASTY (Right) Elevated temp and acute blood loss anemia- will defer to medical team for management PT/OT as able Discussed surgery and recovery with patient and family that was present Continue pain management D/c planning Pulmonary toilet   Brad Antonieta Iba, MPAS [facility] is now [facility]  [place] [address], [place], [place] [address] Phone: [phone] [email or web address] Facebook  [employer]

## 2023-02-07 NOTE — Progress Notes (Signed)
Inpatient Rehab Admissions Coordinator:   Per therapy recommendations, patient was screened for CIR candidacy by Marynell Bies, MS, CCC-SLP. At this time, Pt. does not appear to demonstrate medical necessity to justify in hospital rehabilitation/CIR. will not pursue a rehab consult for this Pt.   Recommend other rehab venues to be pursued.  Please contact me with any questions.  Annabella Elford, MS, CCC-SLP Rehab Admissions Coordinator  [phone] (celll) [phone] (office)   

## 2023-02-07 NOTE — TOC CAGE-AID Note (Signed)
Transition of Care [facility]) - CAGE-AID Screening   Patient Details  Name: ASIANAE KEIGLEY MRN: [id number] Date of Birth: 02/01/1936  Transition of Care [facility]) CM/SW Contact:    Judie Bonus, RN Phone Number: 02/07/2023, 4:35 AM   Clinical Narrative:  Pt denies current tobacco, social etoh, and denies drug usage. No resources needed.   CAGE-AID Screening:    Have You Ever Felt You Ought to Cut Down on Your Drinking or Drug Use?: No Have People Annoyed You By Critizing Your Drinking Or Drug Use?: No Have You Felt Bad Or Guilty About Your Drinking Or Drug Use?: No Have You Ever Had a Drink or Used Drugs First Thing In The Morning to Steady Your Nerves or to Get Rid of a Hangover?: No CAGE-AID Score: 0  Substance Abuse Education Offered: No

## 2023-02-07 NOTE — Progress Notes (Addendum)
PROGRESS NOTE    Ariel Lambert  ZOX:[id number] DOB: 18-Sep-1935 DOA: 02/05/2023 PCP: Mila Palmer, MD   Chief Complaint  Patient presents with   Fall    Brief Narrative:  Patient is a pleasant 87 year old female history of hypertension, hyperlipidemia, osteoporosis, hypothyroidism, carotid artery stenosis status post carotid endarterectomy, asthma presented to the ED after mechanical fall.  Plain films of the right hip and pelvis reviewed mild displaced subcapital femoral fracture on the right.  Patient seen in consultation by orthopedics and patient for probable surgical repair 02/06/2023.   Assessment & Plan:   Principal Problem:   S/P right hip fracture Active Problems:   Leukocytosis   Acute hypoxic respiratory failure (HCC)   Hyperthyroidism   Hypertension   Chronic obstructive pulmonary disease (HCC)   Fever   Constipation  #1 right hip fracture -Second due to mechanical fall -Patient seen in consultation by orthopedics and patient status post right hip hemiarthroplasty 02/06/2023.  -Continue IV fluids, IV antiemetics. -Continue scheduled Tylenol 1000 mg 3 times daily, IV Dilaudid as needed, oxycodone as needed, Robaxin as needed. -Supportive care. -Postop DVT prophylaxis and pain management per orthopedics.  2.  Acute hypoxic respiratory failure in the setting of opioid use -Patient noted not to be on oxygen supplementation at baseline. -Patient to have received fentanyl in the ED leading to hypoxia and patient maintained on nasal cannula. -Chest x-ray ordered, negative for any acute infiltrate or pulmonary edema. -Continue pulmonary toileting with incentive spirometry. -Flutter valve. -Continue bronchodilators.  3.  Leukocytosis -Likely reactive leukocytosis secondary to problem #1. -Chest x-ray obtained with no acute infiltrate.  -Urinalysis nitrite negative, leukocytes negative. -Patient afebrile. -Leukocytosis trended down.  4.   Hyperthyroidism -Tapazole.  5.  COPD -Stable. -Continue Dulera, Incruse, Claritin.   -Xopenex nebs as needed.    6.  Hypertension -Patient started on decreased dose of Avapro. -BP soft/borderline as such Avapro discontinued.  -IV fluids. -IV hydralazine.  7.  Dehydration -IV fluids.  8.  Constipation -Place on MiraLAX twice daily. -Senokot-S twice daily. -Dulcolax suppository x 1.  9.  Fever -Patient noted to spike a temp of 103 this morning.  Likely a postop fever. -Check a chest x-ray, blood cultures x 2, UA with cultures and sensitivities. -Pulmonary toileting. -Monitor off antibiotics.  10.  Postop acute blood loss anemia -Hematoma noted on op report that was evacuated. -Patient with no overt bleeding. -Anemia panel with iron level of 9, TIBC of 175, ferritin of 141, folate of 29.9. -Repeat H&H this afternoon. -Transfusion threshold hemoglobin < 7.     DVT prophylaxis: Postop per orthopedics. Code Status: Full Family Communication: Updated patient and daughter at bedside.  Disposition: TBD  Status is: Inpatient Remains inpatient appropriate because: Severity of illness   Consultants:  Orthopedics: Andrez Grime, PA 02/05/2023  Procedures:  Plain films right knee 02/05/2023 Chest x-ray 02/05/2023 Plain films of the right hip and pelvis 02/05/2023 Right hip hemiarthroplasty: Per Dr. Linna Caprice 02/06/2023  Antimicrobials:  Anti-infectives (From admission, onward)    Start     Dose/Rate Route Frequency Ordered Stop   02/07/23 0600  ceFAZolin (ANCEF) IVPB 2g/100 mL premix  Status:  Discontinued        2 g 200 mL/hr over 30 Minutes Intravenous On call to O.R. 02/06/23 1230 02/06/23 1233   02/06/23 1730  ceFAZolin (ANCEF) IVPB 2g/100 mL premix        2 g 200 mL/hr over 30 Minutes Intravenous Every 6 hours 02/06/23 1643 02/07/23 0103  02/06/23 1230  ceFAZolin (ANCEF) IVPB 2g/100 mL premix        2 g 200 mL/hr over 30 Minutes Intravenous  Once 02/06/23 1220  02/06/23 1258   02/06/23 1225  ceFAZolin (ANCEF) 2-4 GM/100ML-% IVPB       Note to Pharmacy: Shanda Bumps M: cabinet override      02/06/23 1225 02/06/23 1813         Subjective: Patient laying in bed.  Daughter at bedside.  Patient states not feeling well feels constipated.  Some complaints of right hip pain somewhat better than on admission.  Patient noted to have a temp of 103 this morning.  Poor oral intake per daughter.    Objective: Vitals:   02/07/23 0700 02/07/23 0911 02/07/23 1000 02/07/23 1152  BP: 128/74   123/75  Pulse: 89   74  Resp:    17  Temp: (!) 103 F (39.4 C)  98.4 F (36.9 C) 98.2 F (36.8 C)  TempSrc: Oral  Oral Oral  SpO2: 94% 98%  100%  Weight:      Height:        Intake/Output Summary (Last 24 hours) at 02/07/2023 1644 Last data filed at 02/07/2023 0900 Gross per 24 hour  Intake 240 ml  Output 350 ml  Net -110 ml   Filed Weights   02/05/23 0332  Weight: 59.3 kg    Examination:  General exam: NAD.  Dry mucous membranes Respiratory system: Lungs clear to auscultation bilaterally anterior lung field no wheezes, no crackles, no rhonchi.  Fair air movement.  Speaking in full sentences.   Cardiovascular system: Regular rate rhythm no murmurs rubs or gallops.  No JVD.  No lower extremity edema.  Gastrointestinal system: Abdomen is soft, nontender, nondistended, positive bowel sounds.  No rebound.  No guarding.  Central nervous system: Alert and oriented. No focal neurological deficits. Extremities: Right hip with some tenderness to palpation.  Some slight erythema in the right hip region.  Postop dressing in place.  Skin: No rashes, lesions or ulcers Psychiatry: Judgement and insight appear normal. Mood & affect appropriate.     Data Reviewed: I have personally reviewed following labs and imaging studies  CBC: Recent Labs  Lab 02/05/23 0205 02/06/23 0351 02/07/23 0207 02/07/23 1448  WBC 12.6* 8.4 9.0 11.8*  NEUTROABS  --   --  7.7  10.1*  HGB 12.7 10.5* 8.9* 8.8*  HCT 39.3 32.3* 28.2* 26.7*  MCV 101.3* 102.5* 103.7* 100.0  PLT 202 162 122* 128*    Basic Metabolic Panel: Recent Labs  Lab 02/05/23 0205 02/06/23 0351 02/07/23 0207  NA 136 134* 130*  K 3.8 3.9 3.5  CL 100 105 100  CO2 23 22 20*  GLUCOSE 121* 112* 99  BUN 12 12 8   CREATININE 0.58 0.59 0.62  CALCIUM 8.6* 7.7* 7.4*    GFR: Estimated Creatinine Clearance: 42.5 mL/min (by C-G formula based on SCr of 0.62 mg/dL).  Liver Function Tests: No results for input(s): "AST", "ALT", "ALKPHOS", "BILITOT", "PROT", "ALBUMIN" in the last 168 hours.  CBG: No results for input(s): "GLUCAP" in the last 168 hours.   Recent Results (from the past 240 hour(s))  Surgical pcr screen     Status: Abnormal   Collection Time: 02/05/23  6:38 PM   Specimen: Nasal Mucosa; Nasal Swab  Result Value Ref Range Status   MRSA, PCR NEGATIVE NEGATIVE Final   Staphylococcus aureus POSITIVE (A) NEGATIVE Final    Comment: (NOTE) The Xpert SA Assay (FDA  approved for NASAL specimens in patients 3 years of age and older), is one component of a comprehensive surveillance program. It is not intended to diagnose infection nor to guide or monitor treatment. Performed at [facility], [address] [place], [place] [address]          Radiology Studies: DG CHEST PORT 1 VIEW  Result Date: 02/07/2023 CLINICAL DATA:  Fever. EXAM: PORTABLE CHEST 1 VIEW COMPARISON:  02/05/2023 FINDINGS: There is a new retrocardiac opacity and left pleural effusion. Mild streaky right infrahilar opacity. The patient is rotated. Grossly stable heart size and mediastinal contours allowing for differences in technique. No pulmonary edema or pneumothorax. IMPRESSION: New retrocardiac opacity and left pleural effusion, suspicious for pneumonia. Streaky right infrahilar opacity may be atelectasis or pneumonia. Aspiration could have this appearance in the appropriate clinical setting.  Electronically Signed   By: Narda Rutherford M.D.   On: 02/07/2023 15:26   DG Pelvis Portable  Result Date: 02/06/2023 CLINICAL DATA:  Postop. EXAM: PORTABLE PELVIS 1-2 VIEWS COMPARISON:  Preoperative radiograph 04/07/2023 FINDINGS: Right hip arthroplasty with cerclage wire fixation in expected alignment. No periprosthetic lucency or fracture. Recent postsurgical change includes air and edema in the soft tissues. Lateral skin staples in place. IMPRESSION: Right hip arthroplasty without immediate postoperative complication. Electronically Signed   By: Narda Rutherford M.D.   On: 02/06/2023 15:59   DG HIP UNILAT WITH PELVIS 1V RIGHT  Result Date: 02/06/2023 CLINICAL DATA:  [id number] Elective surgery [id number] EXAM: DG HIP (WITH OR WITHOUT PELVIS) 1V RIGHT COMPARISON:  Hip radiograph 02/05/2023 FINDINGS: Intraoperative fluoroscopic images during right hip arthroplasty with proximal cable fixation. Normal alignment. Expected soft tissue changes. Fluoroscopy time 4.2 seconds. Dose: 0.3458 mGy. IMPRESSION: Intraoperative fluoroscopic images during right hip arthroplasty and proximal cable fixation. Normal alignment. Fluoroscopy time/dose as above. Electronically Signed   By: Caprice Renshaw M.D.   On: 02/06/2023 15:31   DG C-Arm 1-60 Min-No Report  Result Date: 02/06/2023 Fluoroscopy was utilized by the requesting physician.  No radiographic interpretation.   DG C-Arm 1-60 Min-No Report  Result Date: 02/06/2023 Fluoroscopy was utilized by the requesting physician.  No radiographic interpretation.   DG C-Arm 1-60 Min-No Report  Result Date: 02/06/2023 Fluoroscopy was utilized by the requesting physician.  No radiographic interpretation.        Scheduled Meds:  aspirin  81 mg Oral BID   bisacodyl  10 mg Rectal Once   docusate sodium  100 mg Oral BID   ezetimibe  10 mg Oral q1800   And   simvastatin  20 mg Oral q1800   fluticasone  2 spray Each Nare Daily   loratadine  10 mg Oral Daily   magnesium  gluconate  500 mg Oral Daily   methimazole  7.5 mg Oral QODAY   mometasone-formoterol  2 puff Inhalation BID   polyethylene glycol  17 g Oral BID   senna  1 tablet Oral BID   umeclidinium bromide  1 puff Inhalation Daily   Continuous Infusions:  sodium chloride     methocarbamol (ROBAXIN) IV       LOS: 2 days    Time spent: 35 minutes    Ramiro Harvest, MD [facility]   To contact the attending provider between 7A-7P or the covering provider during after hours 7P-7A, please log into the web site [email or web address] and access using universal [place] password for that web site. If you do not have the password, please call the hospital operator.  02/07/2023, 4:44 PM

## 2023-02-07 NOTE — Evaluation (Signed)
Physical Therapy Evaluation Patient Details Name: Ariel Lambert MRN: [id number] DOB: 03/01/1936 Today's Date: 02/07/2023  History of Present Illness  Patient is 87 y.o. female presented after fall with Rt femoral neck fracture now s/p Rt hemiarthroplasty anterior approach on 02/06/23. PMH significant for asthma, OA, CKD, CVA, COPD, HTN, hyperthyroidism, osteoporosis, Lt TKA in 2023.   Clinical Impression  Ariel Lambert is 87 y.o. female admitted with above HPI and diagnosis. Patient is currently limited by functional impairments below (see PT problem list). Patient lives alone and PTA was mobilizing with SPC at [facility] level. She has assist available from family and following TKA in 2023 they provided 24/7 assist. Currently pt requires Max assist for bed mobility and was unable to complete sit<>stand with 1+ Max assist. She is motivated to progress mobility and will benefit from continued skilled PT interventions to address impairments and progress independence with mobility, recommending intense rehab follow up >3 hours/day to maximize regain of functional mobility/independence. Acute PT will follow and progress as able.        Recommendations for follow up therapy are one component of a multi-disciplinary discharge planning process, led by the attending physician.  Recommendations may be updated based on patient status, additional functional criteria and insurance authorization.  Follow Up Recommendations       Assistance Recommended at Discharge Frequent or constant Supervision/Assistance  Patient can return home with the following  Two people to help with walking and/or transfers;Two people to help with bathing/dressing/bathroom;Assistance with cooking/housework;Direct supervision/assist for medications management;Assist for transportation;Help with stairs or ramp for entrance    Equipment Recommendations  (TBD at next venue)  Recommendations for Other Services  Rehab  consult;OT consult    Functional Status Assessment Patient has had a recent decline in their functional status and demonstrates the ability to make significant improvements in function in a reasonable and predictable amount of time.     Precautions / Restrictions Precautions Precautions: Fall Restrictions Weight Bearing Restrictions: No RLE Weight Bearing: Weight bearing as tolerated      Mobility  Bed Mobility Overal bed mobility: Needs Assistance Bed Mobility: Supine to Sit, Sit to Supine, Rolling Rolling: Mod assist   Supine to sit: Max assist, HOB elevated Sit to supine: Max assist   General bed mobility comments: pt attemptign to reach for bed rail with cues to sequence, ultimately max assist required to move supine>sit EOB and to return to supine.    Transfers                   General transfer comment: attempted sit<>stand with Max A and RW, pt unable to fully initiate power up through Lt LE and UE's.    Ambulation/Gait                  Stairs            Wheelchair Mobility    Modified Rankin (Stroke Patients Only)       Balance Overall balance assessment: Needs assistance Sitting-balance support: Feet supported, Bilateral upper extremity supported Sitting balance-Leahy Scale: Fair                                       Pertinent Vitals/Pain Pain Assessment Pain Assessment: Faces Faces Pain Scale: Hurts even more Pain Location: Rt hip/leg Pain Descriptors / Indicators: Aching, Discomfort, Grimacing Pain Intervention(s): Limited activity within patient's tolerance, Monitored during  session, Repositioned    Home Living Family/patient expects to be discharged to:: Private residence   Available Help at Discharge: Family Type of Home: House Home Access: Stairs to enter Entrance Stairs-Rails: Left Entrance Stairs-Number of Steps: 3   Home Layout: One level Home Equipment: Agricultural consultant (2 wheels);BSC/3in1;Shower  seat;Cane - single point      Prior Function Prior Level of Function : Driving             Mobility Comments: PTA pt wakling with SPC       Hand Dominance        Extremity/Trunk Assessment   Upper Extremity Assessment Upper Extremity Assessment: Generalized weakness    Lower Extremity Assessment Lower Extremity Assessment: Generalized weakness (limited by pain)    Cervical / Trunk Assessment Cervical / Trunk Assessment: Kyphotic  Communication   Communication: HOH  Cognition Arousal/Alertness: Awake/alert Behavior During Therapy: WFL for tasks assessed/performed Overall Cognitive Status: Within Functional Limits for tasks assessed                                          General Comments      Exercises     Assessment/Plan    PT Assessment Patient needs continued PT services  PT Problem List Decreased range of motion;Decreased activity tolerance;Decreased balance;Decreased mobility;Decreased coordination;Decreased cognition;Decreased knowledge of use of DME;Decreased strength;Decreased safety awareness;Decreased knowledge of precautions;Pain       PT Treatment Interventions DME instruction;Gait training;Stair training;Functional mobility training;Therapeutic activities;Therapeutic exercise;Balance training;Neuromuscular re-education;Cognitive remediation;Patient/family education    PT Goals (Current goals can be found in the Care Plan section)  Acute Rehab PT Goals Patient Stated Goal: for pt to get stronger before returning home PT Goal Formulation: With patient/family Time For Goal Achievement: 02/21/23 Potential to Achieve Goals: Good    Frequency Min 3X/week     Co-evaluation               AM-PAC PT "6 Clicks" Mobility  Outcome Measure Help needed turning from your back to your side while in a flat bed without using bedrails?: A Lot Help needed moving from lying on your back to sitting on the side of a flat bed without  using bedrails?: A Lot Help needed moving to and from a bed to a chair (including a wheelchair)?: Total Help needed standing up from a chair using your arms (e.g., wheelchair or bedside chair)?: Total Help needed to walk in hospital room?: Total Help needed climbing 3-5 steps with a railing? : Total 6 Click Score: 8    End of Session Equipment Utilized During Treatment: Gait belt;Oxygen (pt on 2L throughout session) Activity Tolerance: Patient tolerated treatment well Patient left: in bed;with call bell/phone within reach;with bed alarm set;with family/visitor present Nurse Communication: Mobility status PT Visit Diagnosis: Other abnormalities of gait and mobility (R26.89);Muscle weakness (generalized) (M62.81);History of falling (Z91.81);Difficulty in walking, not elsewhere classified (R26.2);Pain Pain - Right/Left: Right Pain - part of body: Hip    Time: 1230-1305 PT Time Calculation (min) (ACUTE ONLY): 35 min   Charges:   PT Evaluation $PT Eval Moderate Complexity: 1 Mod PT Treatments $Therapeutic Activity: 8-22 mins        Wynn Maudlin, DPT Acute Rehabilitation Services Office [phone]  02/07/23 4:13 PM

## 2023-02-08 DIAGNOSIS — E0590 Thyrotoxicosis, unspecified without thyrotoxic crisis or storm: Secondary | ICD-10-CM | POA: Diagnosis not present

## 2023-02-08 DIAGNOSIS — J9601 Acute respiratory failure with hypoxia: Secondary | ICD-10-CM | POA: Diagnosis not present

## 2023-02-08 DIAGNOSIS — Z8781 Personal history of (healed) traumatic fracture: Secondary | ICD-10-CM | POA: Diagnosis not present

## 2023-02-08 DIAGNOSIS — I10 Essential (primary) hypertension: Secondary | ICD-10-CM | POA: Diagnosis not present

## 2023-02-08 DIAGNOSIS — J189 Pneumonia, unspecified organism: Secondary | ICD-10-CM | POA: Insufficient documentation

## 2023-02-08 LAB — CREATININE, URINE, RANDOM: Creatinine, Urine: 89 mg/dL

## 2023-02-08 LAB — BASIC METABOLIC PANEL
Anion gap: 7 (ref 5–15)
BUN: 12 mg/dL (ref 8–23)
CO2: 22 mmol/L (ref 22–32)
Calcium: 7.2 mg/dL — ABNORMAL LOW (ref 8.9–10.3)
Chloride: 98 mmol/L (ref 98–111)
Creatinine, Ser: 0.64 mg/dL (ref 0.44–1.00)
GFR, Estimated: >60 mL/min (ref >60)
Glucose, Bld: 176 mg/dL — ABNORMAL HIGH (ref 70–99)
Potassium: 3.3 mmol/L — ABNORMAL LOW (ref 3.5–5.1)
Sodium: 127 mmol/L — ABNORMAL LOW (ref 135–145)

## 2023-02-08 LAB — CBC
HCT: 23.9 % — ABNORMAL LOW (ref 36.0–46.0)
Hemoglobin: 8.1 g/dL — ABNORMAL LOW (ref 12.0–15.0)
MCH: 33.3 pg (ref 26.0–34.0)
MCHC: 33.9 g/dL (ref 30.0–36.0)
MCV: 98.4 fL (ref 80.0–100.0)
Platelets: 139 10*3/uL — ABNORMAL LOW (ref 150–400)
RBC: 2.43 MIL/uL — ABNORMAL LOW (ref 3.87–5.11)
RDW: 13.3 % (ref 11.5–15.5)
WBC: 9.4 10*3/uL (ref 4.0–10.5)
nRBC: 0 % (ref 0.0–0.2)

## 2023-02-08 LAB — ALBUMIN: Albumin: 2 g/dL — ABNORMAL LOW (ref 3.5–5.0)

## 2023-02-08 LAB — HEMOGLOBIN AND HEMATOCRIT, BLOOD
HCT: 24.9 % — ABNORMAL LOW (ref 36.0–46.0)
Hemoglobin: 8.1 g/dL — ABNORMAL LOW (ref 12.0–15.0)

## 2023-02-08 LAB — SODIUM, URINE, RANDOM: Sodium, Ur: 12 mmol/L

## 2023-02-08 LAB — STREP PNEUMONIAE URINARY ANTIGEN: Strep Pneumo Urinary Antigen: NEGATIVE

## 2023-02-08 LAB — OSMOLALITY, URINE: Osmolality, Ur: 529 mosm/kg (ref 300–900)

## 2023-02-08 LAB — URINE CULTURE

## 2023-02-08 LAB — CULTURE, BLOOD (ROUTINE X 2)

## 2023-02-08 LAB — MAGNESIUM: Magnesium: 1.8 mg/dL (ref 1.7–2.4)

## 2023-02-08 LAB — OSMOLALITY: Osmolality: 278 mOsm/kg (ref 275–295)

## 2023-02-08 LAB — HIV ANTIBODY (ROUTINE TESTING W REFLEX): HIV Screen 4th Generation wRfx: NONREACTIVE

## 2023-02-08 MED ORDER — SODIUM CHLORIDE 0.9 % IV SOLN: INTRAVENOUS | Status: DC

## 2023-02-08 MED ORDER — POTASSIUM CHLORIDE CRYS ER 10 MEQ PO TBCR
40.0000 meq | EXTENDED_RELEASE_TABLET | Freq: Once | ORAL | Status: AC
  Administered 2023-02-08: 40 meq via ORAL
  Filled 2023-02-08: qty 4

## 2023-02-08 MED ORDER — SODIUM CHLORIDE 0.9 % IV BOLUS
500.0000 mL | Freq: Once | INTRAVENOUS | Status: AC
  Administered 2023-02-08: 500 mL via INTRAVENOUS

## 2023-02-08 MED ORDER — SODIUM CHLORIDE 0.9 % IV SOLN
3.0000 g | Freq: Four times a day (QID) | INTRAVENOUS | Status: DC
  Administered 2023-02-08 – 2023-02-11 (×13): 3 g via INTRAVENOUS
  Filled 2023-02-08 (×13): qty 8

## 2023-02-08 NOTE — Progress Notes (Signed)
   Subjective: 2 Days Post-Op Procedure(s) (LRB): ANTERIOR TOTAL HIP ARTHROPLASTY (Right)  Mild to moderate soreness to right hip Otherwise doing well Patient reports pain as moderate.  Objective:   VITALS:   Vitals:   02/08/23 0745 02/08/23 0805  BP: 137/65 137/65  Pulse:  70  Resp:  18  Temp:  97.8 F (36.6 C)  SpO2:  100%    Right hip incision healing well Dressing intact Nv intact distally No drainage   LABS Recent Labs    02/07/23 0207 02/07/23 1448 02/08/23 0139  HGB 8.9* 8.8* 8.1*  HCT 28.2* 26.7* 23.9*  WBC 9.0 11.8* 9.4  PLT 122* 128* 139*    Recent Labs    02/06/23 0351 02/07/23 0207 02/08/23 0139  NA 134* 130* 127*  K 3.9 3.5 3.3*  BUN 12 8 12   CREATININE 0.59 0.62 0.64  GLUCOSE 112* 99 176*     Assessment/Plan: 2 Days Post-Op Procedure(s) (LRB): ANTERIOR TOTAL HIP ARTHROPLASTY (Right) PT/OT as able Discussed surgery and recovery with patient and family that was present Continue pain management D/c planning Pulmonary toilet  Netta Cedars, MD Orthopaedic Surgery [facility]

## 2023-02-08 NOTE — Plan of Care (Signed)
  Problem: Health Behavior/Discharge Planning: Goal: Ability to manage health-related needs will improve Outcome: Progressing   

## 2023-02-08 NOTE — Evaluation (Signed)
Clinical/Bedside Swallow Evaluation Patient Details  Name: NOELANI LEFORT MRN: [id number] Date of Birth: 1936-05-05  Today's Date: 02/08/2023 Time: SLP Start Time (ACUTE ONLY): 1317 SLP Stop Time (ACUTE ONLY): 1329 SLP Time Calculation (min) (ACUTE ONLY): 12 min  Past Medical History:  Past Medical History:  Diagnosis Date   Allergy    Arthritis    ASTHMA 05/03/2009   Asthma    Carotid stenosis    Chronic kidney disease    " in remission"   COPD (chronic obstructive pulmonary disease) (HCC)    CVA (cerebral infarction)    Dyslipidemia    Headache    Hearing aid worn    B/L   HYPERTENSION 04/30/2007   HYPERTHYROIDISM 06/17/2007   Hyperthyroidism    Menopause    Multinodular goiter    OSTEOPOROSIS 04/30/2007   Stroke (HCC)    Wears glasses    Past Surgical History:  Past Surgical History:  Procedure Laterality Date   BACK SURGERY     BREAST EXCISIONAL BIOPSY Left    carotid artery surgery     CATARACT EXTRACTION W/ INTRAOCULAR LENS  IMPLANT, BILATERAL     ENDARTERECTOMY Right 09/29/2019   Procedure: REDO OF RIGHT ENDARTERECTOMY CAROTID;  Surgeon: Maeola Harman, MD;  Location: [facility] OR;  Service: Vascular;  Laterality: Right;   NECK SURGERY     OTHER SURGICAL HISTORY     discectomy   TOTAL KNEE ARTHROPLASTY Left 09/30/2021   Procedure: TOTAL KNEE ARTHROPLASTY;  Surgeon: Ollen Gross, MD;  Location: [facility] ORS;  Service: Orthopedics;  Laterality: Left;   WISDOM TOOTH EXTRACTION     HPI:  Patient is 87 y.o. female presented after fall with Rt femoral neck fracture now s/p Rt hemiarthroplasty anterior approach on 02/06/23. PMH significant for asthma, OA, CKD, CVA, COPD, HTN, hyperthyroidism, osteoporosis, Lt TKA in 2023.    Assessment / Plan / Recommendation  Clinical Impression  Pt and daughter deny prior or current difficulty swallowing. Pt has intact dentition and oromotor abilities with strong cough. Needed encouragement for upright position due to hip  pain. Oral control, mastication and transit across textures within normal limits. There were no concerns of airway compromise with straw sips thin water for multiple swallows. She did belch several times and denied GER. Recommend continue regular texture, thin liquids and pills with thin. Educated pt to sit upright with po's. No further ST needed. SLP Visit Diagnosis: Dysphagia, unspecified (R13.10)    Aspiration Risk  No limitations    Diet Recommendation Regular;Thin liquid   Liquid Administration via: Cup;Straw Medication Administration: Whole meds with liquid Supervision: Patient able to self feed Compensations: Slow rate;Small sips/bites Postural Changes: Seated upright at 90 degrees    Other  Recommendations Oral Care Recommendations: Oral care BID    Recommendations for follow up therapy are one component of a multi-disciplinary discharge planning process, led by the attending physician.  Recommendations may be updated based on patient status, additional functional criteria and insurance authorization.  Follow up Recommendations No SLP follow up      Assistance Recommended at Discharge    Functional Status Assessment Patient has not had a recent decline in their functional status  Frequency and Duration            Prognosis        Swallow Study   General Date of Onset: 02/05/23 HPI: Patient is 87 y.o. female presented after fall with Rt femoral neck fracture now s/p Rt hemiarthroplasty anterior approach on 02/06/23. PMH significant  for asthma, OA, CKD, CVA, COPD, HTN, hyperthyroidism, osteoporosis, Lt TKA in 2023. Type of Study: Bedside Swallow Evaluation Previous Swallow Assessment:  (none) Diet Prior to this Study: Regular;Thin liquids (Level 0) Temperature Spikes Noted: No Respiratory Status: Room air History of Recent Intubation:  (during surgery) Behavior/Cognition: Alert;Cooperative;Pleasant mood Oral Cavity Assessment: Within Functional Limits Oral Care  Completed by SLP: No Oral Cavity - Dentition: Adequate natural dentition Vision: Functional for self-feeding Self-Feeding Abilities: Able to feed self Patient Positioning: Upright in bed Baseline Vocal Quality: Normal Volitional Cough: Strong Volitional Swallow: Able to elicit    Oral/Motor/Sensory Function Overall Oral Motor/Sensory Function: Within functional limits   Ice Chips Ice chips: Not tested   Thin Liquid Thin Liquid: Within functional limits Presentation: Straw    Nectar Thick Nectar Thick Liquid: Not tested   Honey Thick Honey Thick Liquid: Not tested   Puree Puree: Within functional limits   Solid     Solid: Within functional limits      Roque Cash Breck Coons 02/08/2023,1:39 PM

## 2023-02-08 NOTE — Progress Notes (Signed)
PROGRESS NOTE    Ariel Lambert  WGN:[id number] DOB: Mar 25, 1936 DOA: 02/05/2023 PCP: Mila Palmer, MD   Chief Complaint  Patient presents with   Fall    Brief Narrative:  Patient is a pleasant 87 year old female history of hypertension, hyperlipidemia, osteoporosis, hypothyroidism, carotid artery stenosis status post carotid endarterectomy, asthma presented to the ED after mechanical fall.  Plain films of the right hip and pelvis reviewed mild displaced subcapital femoral fracture on the right.  Patient seen in consultation by orthopedics and patient for probable surgical repair 02/06/2023.   Assessment & Plan:   Principal Problem:   S/P right hip fracture Active Problems:   Leukocytosis   Acute hypoxic respiratory failure (HCC)   Hyperthyroidism   Hypertension   Chronic obstructive pulmonary disease (HCC)   Fever   Constipation   PNA (pneumonia)  #1 right hip fracture -Second due to mechanical fall -Patient seen in consultation by orthopedics and patient status post right hip hemiarthroplasty 02/06/2023.  -Continue IV fluids, IV antiemetics. -Continue scheduled Tylenol 1000 mg 3 times daily, IV Dilaudid as needed, oxycodone as needed, Robaxin as needed. -Supportive care. -Postop DVT prophylaxis and pain management per orthopedics.  2.  Acute hypoxic respiratory failure in the setting of opioid use -Patient noted not to be on oxygen supplementation at baseline. -Patient to have received fentanyl in the ED leading to hypoxia and patient maintained on nasal cannula. -Chest x-ray ordered initially, negative for any acute infiltrate or pulmonary edema. -Repeat chest x-ray ordered 02/07/2023 due to high fevers concerning for pneumonia. -Continue pulmonary toileting with incentive spirometry. -Flutter valve. -Continue bronchodilators. -IV antibiotics.  3.  Leukocytosis -Likely reactive leukocytosis secondary to problem #1 versus secondary to aspiration  pneumonia. -Initial chest x-ray obtained on admission with no acute infiltrate.   -Repeat chest x-ray obtained 02/07/2023 with concerns for infiltrate. -Urinalysis nitrite negative, leukocytes negative. -Patient afebrile. -Leukocytosis trended down. -Placed empirically on IV Unasyn due to concerns for aspiration pneumonia.  4.  Hyperthyroidism -Continue Tapazole.    5.  COPD -Stable. -Continue Dulera, Incruse, Claritin.   -Xopenex nebs as needed.    6.  Hypertension -Patient started on decreased dose of Avapro. -BP soft/borderline as such Avapro discontinued.  -Blood pressure soft today. -Normal saline 500 cc bolus x 1, IV fluids. -IV hydralazine.  7.  Dehydration -IV fluids.  8.  Constipation -Continue current bowel regimen of MiraLAX twice daily, Senokot-S twice daily.  .  9.  Fever/probable aspiration pneumonia -Patient noted to spike a temp of 103 the morning of 02/07/2023 and the evening of 02/07/2023.   -Chest x-ray done concerning for probable pneumonia.   -Urinalysis nitrite negative leukocytes negative.   -Blood cultures pending.   -Check a urine Legionella antigen, check a urine pneumococcus antigen.   -Place empirically on IV Unasyn.   -Pulmonary toileting.    10.  Postop acute blood loss anemia -Hematoma noted on op report that was evacuated. -Patient with no overt bleeding. -Anemia panel with iron level of 9, TIBC of 175, ferritin of 141, folate of 29.9. -Hemoglobin at 8.1 this morning.   -Blood pressure soft. -If patient becomes hypotensive may consider transfusion of 1 unit PRBCs. -Transfusion threshold hemoglobin < 7.      DVT prophylaxis: Postop per orthopedics. Code Status: Full Family Communication: Updated patient and daughter at bedside.  Disposition: TBD  Status is: Inpatient Remains inpatient appropriate because: Severity of illness   Consultants:  Orthopedics: Andrez Grime, PA 02/05/2023  Procedures:  Plain films right  knee  02/05/2023 Chest x-ray 02/05/2023, 02/07/2023 Plain films of the right hip and pelvis 02/05/2023 Right hip hemiarthroplasty: Per Dr. Linna Caprice 02/06/2023  Antimicrobials:  Anti-infectives (From admission, onward)    Start     Dose/Rate Route Frequency Ordered Stop   02/08/23 0900  Ampicillin-Sulbactam (UNASYN) 3 g in sodium chloride 0.9 % 100 mL IVPB        3 g 200 mL/hr over 30 Minutes Intravenous Every 6 hours 02/08/23 0809     02/07/23 0600  ceFAZolin (ANCEF) IVPB 2g/100 mL premix  Status:  Discontinued        2 g 200 mL/hr over 30 Minutes Intravenous On call to O.R. 02/06/23 1230 02/06/23 1233   02/06/23 1730  ceFAZolin (ANCEF) IVPB 2g/100 mL premix        2 g 200 mL/hr over 30 Minutes Intravenous Every 6 hours 02/06/23 1643 02/07/23 0103   02/06/23 1230  ceFAZolin (ANCEF) IVPB 2g/100 mL premix        2 g 200 mL/hr over 30 Minutes Intravenous  Once 02/06/23 1220 02/06/23 1258   02/06/23 1225  ceFAZolin (ANCEF) 2-4 GM/100ML-% IVPB       Note to Pharmacy: Shanda Bumps M: cabinet override      02/06/23 1225 02/06/23 1813         Subjective: Patient laying in bed.  States she feels better.  No chest pain.  No shortness of breath.  No abdominal pain.  With complaints of right lower extremity/hip pain.  Daughter at bedside.  Patient noted to have had a fever overnight with a temp of 103.  Objective: Vitals:   02/08/23 0245 02/08/23 0640 02/08/23 0745 02/08/23 0805  BP: 120/69 (!) 149/95 137/65 137/65  Pulse: 75   70  Resp: (!) 22 (!) 21  18  Temp: 97.8 F (36.6 C) 97.9 F (36.6 C)  97.8 F (36.6 C)  TempSrc: Axillary Axillary  Axillary  SpO2: 100% 100%  100%  Weight:      Height:        Intake/Output Summary (Last 24 hours) at 02/08/2023 1211 Last data filed at 02/08/2023 0758 Gross per 24 hour  Intake 480 ml  Output 1775 ml  Net -1295 ml    Filed Weights   02/05/23 0332  Weight: 59.3 kg    Examination:  General exam: NAD.  Dry mucous membranes. Respiratory  system: CTAB.  No wheezes, no crackles, no rhonchi.  Some decreased breath sounds in the left base.  Speaking in full sentences.  Cardiovascular system: RRR no murmurs rubs or gallops.  No JVD.  No lower extremity edema.  Gastrointestinal system: Abdomen is soft, nontender, nondistended, positive bowel sounds.  No rebound.  No guarding.   Central nervous system: Alert and oriented. No focal neurological deficits. Extremities: Right hip with tenderness to palpation.  Some slight erythema in the right hip region.  Postop dressing in place.  Skin: No rashes, lesions or ulcers Psychiatry: Judgement and insight appear normal. Mood & affect appropriate.     Data Reviewed: I have personally reviewed following labs and imaging studies  CBC: Recent Labs  Lab 02/05/23 0205 02/06/23 0351 02/07/23 0207 02/07/23 1448 02/08/23 0139  WBC 12.6* 8.4 9.0 11.8* 9.4  NEUTROABS  --   --  7.7 10.1*  --   HGB 12.7 10.5* 8.9* 8.8* 8.1*  HCT 39.3 32.3* 28.2* 26.7* 23.9*  MCV 101.3* 102.5* 103.7* 100.0 98.4  PLT 202 162 122* 128* 139*     Basic Metabolic  Panel: Recent Labs  Lab 02/05/23 0205 02/06/23 0351 02/07/23 0207 02/08/23 0139  NA 136 134* 130* 127*  K 3.8 3.9 3.5 3.3*  CL 100 105 100 98  CO2 23 22 20* 22  GLUCOSE 121* 112* 99 176*  BUN 12 12 8 12   CREATININE 0.58 0.59 0.62 0.64  CALCIUM 8.6* 7.7* 7.4* 7.2*  MG  --   --   --  1.8     GFR: Estimated Creatinine Clearance: 42.5 mL/min (by C-G formula based on SCr of 0.64 mg/dL).  Liver Function Tests: No results for input(s): "AST", "ALT", "ALKPHOS", "BILITOT", "PROT", "ALBUMIN" in the last 168 hours.  CBG: No results for input(s): "GLUCAP" in the last 168 hours.   Recent Results (from the past 240 hour(s))  Surgical pcr screen     Status: Abnormal   Collection Time: 02/05/23  6:38 PM   Specimen: Nasal Mucosa; Nasal Swab  Result Value Ref Range Status   MRSA, PCR NEGATIVE NEGATIVE Final   Staphylococcus aureus POSITIVE (A)  NEGATIVE Final    Comment: (NOTE) The Xpert SA Assay (FDA approved for NASAL specimens in patients 24 years of age and older), is one component of a comprehensive surveillance program. It is not intended to diagnose infection nor to guide or monitor treatment. Performed at [facility], [address], [place], [place] [address]   Culture, blood (Routine X 2) w Reflex to ID Panel     Status: None (Preliminary result)   Collection Time: 02/07/23 10:20 AM   Specimen: BLOOD RIGHT HAND  Result Value Ref Range Status   Specimen Description BLOOD RIGHT HAND  Final   Special Requests   Final    BOTTLES DRAWN AEROBIC AND ANAEROBIC Blood Culture adequate volume   Culture   Final    NO GROWTH < 24 HOURS Performed at [facility], [address], [place], [place] [address]    Report Status PENDING  Incomplete  Culture, blood (Routine X 2) w Reflex to ID Panel     Status: None (Preliminary result)   Collection Time: 02/07/23 10:24 AM   Specimen: BLOOD LEFT HAND  Result Value Ref Range Status   Specimen Description BLOOD LEFT HAND  Final   Special Requests   Final    BOTTLES DRAWN AEROBIC AND ANAEROBIC Blood Culture adequate volume   Culture   Final    NO GROWTH < 24 HOURS Performed at [facility], [address], [place], [place] [address]    Report Status PENDING  Incomplete         Radiology Studies: DG CHEST PORT 1 VIEW  Result Date: 02/07/2023 CLINICAL DATA:  Fever. EXAM: PORTABLE CHEST 1 VIEW COMPARISON:  02/05/2023 FINDINGS: There is a new retrocardiac opacity and left pleural effusion. Mild streaky right infrahilar opacity. The patient is rotated. Grossly stable heart size and mediastinal contours allowing for differences in technique. No pulmonary edema or pneumothorax. IMPRESSION: New retrocardiac opacity and left pleural effusion, suspicious for pneumonia. Streaky right infrahilar opacity may be atelectasis or pneumonia. Aspiration could have this appearance  in the appropriate clinical setting. Electronically Signed   By: Narda Rutherford M.D.   On: 02/07/2023 15:26   DG Pelvis Portable  Result Date: 02/06/2023 CLINICAL DATA:  Postop. EXAM: PORTABLE PELVIS 1-2 VIEWS COMPARISON:  Preoperative radiograph 04/07/2023 FINDINGS: Right hip arthroplasty with cerclage wire fixation in expected alignment. No periprosthetic lucency or fracture. Recent postsurgical change includes air and edema in the soft  tissues. Lateral skin staples in place. IMPRESSION: Right hip arthroplasty without immediate postoperative complication. Electronically Signed   By: Narda Rutherford M.D.   On: 02/06/2023 15:59   DG HIP UNILAT WITH PELVIS 1V RIGHT  Result Date: 02/06/2023 CLINICAL DATA:  [id number] Elective surgery [id number] EXAM: DG HIP (WITH OR WITHOUT PELVIS) 1V RIGHT COMPARISON:  Hip radiograph 02/05/2023 FINDINGS: Intraoperative fluoroscopic images during right hip arthroplasty with proximal cable fixation. Normal alignment. Expected soft tissue changes. Fluoroscopy time 4.2 seconds. Dose: 0.3458 mGy. IMPRESSION: Intraoperative fluoroscopic images during right hip arthroplasty and proximal cable fixation. Normal alignment. Fluoroscopy time/dose as above. Electronically Signed   By: Caprice Renshaw M.D.   On: 02/06/2023 15:31   DG C-Arm 1-60 Min-No Report  Result Date: 02/06/2023 Fluoroscopy was utilized by the requesting physician.  No radiographic interpretation.   DG C-Arm 1-60 Min-No Report  Result Date: 02/06/2023 Fluoroscopy was utilized by the requesting physician.  No radiographic interpretation.   DG C-Arm 1-60 Min-No Report  Result Date: 02/06/2023 Fluoroscopy was utilized by the requesting physician.  No radiographic interpretation.        Scheduled Meds:  aspirin  81 mg Oral BID   bisacodyl  10 mg Rectal Once   docusate sodium  100 mg Oral BID   ezetimibe  10 mg Oral q1800   And   simvastatin  20 mg Oral q1800   fluticasone  2 spray Each Nare Daily    loratadine  10 mg Oral Daily   magnesium gluconate  500 mg Oral Daily   methimazole  7.5 mg Oral QODAY   mometasone-formoterol  2 puff Inhalation BID   polyethylene glycol  17 g Oral BID   senna  1 tablet Oral BID   umeclidinium bromide  1 puff Inhalation Daily   Continuous Infusions:  ampicillin-sulbactam (UNASYN) IV 3 g (02/08/23 0859)   methocarbamol (ROBAXIN) IV       LOS: 3 days    Time spent: 35 minutes    Ramiro Harvest, MD [facility]   To contact the attending provider between 7A-7P or the covering provider during after hours 7P-7A, please log into the web site [email or web address] and access using universal [place] password for that web site. If you do not have the password, please call the hospital operator.  02/08/2023, 12:11 PM

## 2023-02-09 ENCOUNTER — Inpatient Hospital Stay (HOSPITAL_COMMUNITY): Payer: Medicare PPO

## 2023-02-09 DIAGNOSIS — R338 Other retention of urine: Secondary | ICD-10-CM | POA: Insufficient documentation

## 2023-02-09 DIAGNOSIS — E0590 Thyrotoxicosis, unspecified without thyrotoxic crisis or storm: Secondary | ICD-10-CM | POA: Diagnosis not present

## 2023-02-09 DIAGNOSIS — I10 Essential (primary) hypertension: Secondary | ICD-10-CM | POA: Diagnosis not present

## 2023-02-09 DIAGNOSIS — J9601 Acute respiratory failure with hypoxia: Secondary | ICD-10-CM | POA: Diagnosis not present

## 2023-02-09 DIAGNOSIS — Z8781 Personal history of (healed) traumatic fracture: Secondary | ICD-10-CM | POA: Diagnosis not present

## 2023-02-09 LAB — BASIC METABOLIC PANEL
Anion gap: 6 (ref 5–15)
BUN: 8 mg/dL (ref 8–23)
CO2: 22 mmol/L (ref 22–32)
Calcium: 6.9 mg/dL — ABNORMAL LOW (ref 8.9–10.3)
Chloride: 106 mmol/L (ref 98–111)
Creatinine, Ser: 0.57 mg/dL (ref 0.44–1.00)
GFR, Estimated: >60 mL/min (ref >60)
Glucose, Bld: 139 mg/dL — ABNORMAL HIGH (ref 70–99)
Potassium: 3.7 mmol/L (ref 3.5–5.1)
Sodium: 134 mmol/L — ABNORMAL LOW (ref 135–145)

## 2023-02-09 LAB — BPAM RBC
Blood Product Expiration Date: 202406212359
Blood Product Expiration Date: 202406242359

## 2023-02-09 LAB — TYPE AND SCREEN: Antibody Screen: NEGATIVE

## 2023-02-09 LAB — URINE CULTURE: Culture: 40000 — AB

## 2023-02-09 LAB — CBC WITH DIFFERENTIAL/PLATELET
Abs Immature Granulocytes: 0.04 10*3/uL (ref 0.00–0.07)
Basophils Absolute: 0 10*3/uL (ref 0.0–0.1)
Basophils Relative: 0 %
Eosinophils Absolute: 0 10*3/uL (ref 0.0–0.5)
Eosinophils Relative: 0 %
HCT: 21.2 % — ABNORMAL LOW (ref 36.0–46.0)
Hemoglobin: 7 g/dL — ABNORMAL LOW (ref 12.0–15.0)
Immature Granulocytes: 1 %
Lymphocytes Relative: 9 %
Lymphs Abs: 0.7 10*3/uL (ref 0.7–4.0)
MCH: 34.1 pg — ABNORMAL HIGH (ref 26.0–34.0)
MCHC: 33 g/dL (ref 30.0–36.0)
MCV: 103.4 fL — ABNORMAL HIGH (ref 80.0–100.0)
Monocytes Absolute: 0.7 10*3/uL (ref 0.1–1.0)
Monocytes Relative: 8 %
Neutro Abs: 6.3 10*3/uL (ref 1.7–7.7)
Neutrophils Relative %: 82 %
Platelets: 124 10*3/uL — ABNORMAL LOW (ref 150–400)
RBC: 2.05 MIL/uL — ABNORMAL LOW (ref 3.87–5.11)
RDW: 13.3 % (ref 11.5–15.5)
WBC: 7.7 10*3/uL (ref 4.0–10.5)
nRBC: 0 % (ref 0.0–0.2)

## 2023-02-09 LAB — MAGNESIUM: Magnesium: 1.7 mg/dL (ref 1.7–2.4)

## 2023-02-09 LAB — PREPARE RBC (CROSSMATCH)

## 2023-02-09 LAB — CULTURE, BLOOD (ROUTINE X 2)

## 2023-02-09 MED ORDER — SODIUM CHLORIDE 0.9% IV SOLUTION: Freq: Once | INTRAVENOUS | Status: AC

## 2023-02-09 MED ORDER — OXYCODONE HCL 5 MG PO TABS
5.0000 mg | ORAL_TABLET | Freq: Once | ORAL | Status: AC | PRN
  Administered 2023-02-09: 5 mg via ORAL
  Filled 2023-02-09: qty 1

## 2023-02-09 MED ORDER — CHLORHEXIDINE GLUCONATE CLOTH 2 % EX PADS
6.0000 | MEDICATED_PAD | Freq: Every day | CUTANEOUS | Status: DC
  Administered 2023-02-09: 6 via TOPICAL

## 2023-02-09 MED ORDER — IOHEXOL 350 MG/ML SOLN
75.0000 mL | Freq: Once | INTRAVENOUS | Status: AC | PRN
  Administered 2023-02-09: 75 mL via INTRAVENOUS

## 2023-02-09 MED ORDER — DIPHENHYDRAMINE HCL 25 MG PO CAPS
25.0000 mg | ORAL_CAPSULE | Freq: Once | ORAL | Status: AC
  Administered 2023-02-09: 25 mg via ORAL
  Filled 2023-02-09: qty 1

## 2023-02-09 MED ORDER — POTASSIUM CHLORIDE CRYS ER 10 MEQ PO TBCR
40.0000 meq | EXTENDED_RELEASE_TABLET | Freq: Once | ORAL | Status: AC
  Administered 2023-02-09: 40 meq via ORAL
  Filled 2023-02-09: qty 4

## 2023-02-09 MED ORDER — SODIUM CHLORIDE 0.9 % IV BOLUS
500.0000 mL | Freq: Once | INTRAVENOUS | Status: AC
  Administered 2023-02-09: 500 mL via INTRAVENOUS

## 2023-02-09 MED ORDER — ACETAMINOPHEN 325 MG PO TABS
650.0000 mg | ORAL_TABLET | Freq: Once | ORAL | Status: AC
  Administered 2023-02-09: 650 mg via ORAL
  Filled 2023-02-09: qty 2

## 2023-02-09 MED ORDER — MAGNESIUM SULFATE 4 GM/100ML IV SOLN
4.0000 g | Freq: Once | INTRAVENOUS | Status: AC
  Administered 2023-02-09: 4 g via INTRAVENOUS
  Filled 2023-02-09: qty 100

## 2023-02-09 NOTE — Progress Notes (Signed)
   Subjective: 3 Days Post-Op Procedure(s) (LRB): ANTERIOR TOTAL HIP ARTHROPLASTY (Right)  Patient seen in rounds for Dr. Linna Caprice. Patient reports fatigue and soreness in the right hip, which is likely largely from immobility. Daughter also reports issues with bladder distention, advised to discuss this with hospitalists today.  Objective: Vital signs in last 24 hours: Temp:  [97.8 F (36.6 C)-102.4 F (39.1 C)] 97.8 F (36.6 C) (05/27 0830) Pulse Rate:  [79-100] 79 (05/27 0600) Resp:  [15-25] 15 (05/27 0830) BP: (83-123)/(46-68) 108/57 (05/27 0830) SpO2:  [91 %-100 %] 99 % (05/27 0600)  Intake/Output from previous day:  Intake/Output Summary (Last 24 hours) at 02/09/2023 0852 Last data filed at 02/09/2023 0700 Gross per 24 hour  Intake 2443.65 ml  Output 2050 ml  Net 393.65 ml    Intake/Output this shift: No intake/output data recorded.  Labs: Recent Labs    02/07/23 0207 02/07/23 1448 02/08/23 0139 02/08/23 1338 02/09/23 0227  HGB 8.9* 8.8* 8.1* 8.1* 7.0*   Recent Labs    02/08/23 0139 02/08/23 1338 02/09/23 0227  WBC 9.4  --  7.7  RBC 2.43*  --  2.05*  HCT 23.9* 24.9* 21.2*  PLT 139*  --  124*   Recent Labs    02/08/23 0139 02/09/23 0227  NA 127* 134*  K 3.3* 3.7  CL 98 106  CO2 22 22  BUN 12 8  CREATININE 0.64 0.57  GLUCOSE 176* 139*  CALCIUM 7.2* 6.9*   No results for input(s): "LABPT", "INR" in the last 72 hours.  Exam: General - Patient is Alert and Oriented Extremity - Neurologically intact Neurovascular intact Sensation intact distally Dorsiflexion/Plantar flexion intact Dressing/Incision - clean, dry, no drainage. Aquacel in place. Motor Function - intact, moving foot and toes well on exam.   Past Medical History:  Diagnosis Date   Allergy    Arthritis    ASTHMA 05/03/2009   Asthma    Carotid stenosis    Chronic kidney disease    " in remission"   COPD (chronic obstructive pulmonary disease) (HCC)    CVA (cerebral  infarction)    Dyslipidemia    Headache    Hearing aid worn    B/L   HYPERTENSION 04/30/2007   HYPERTHYROIDISM 06/17/2007   Hyperthyroidism    Menopause    Multinodular goiter    OSTEOPOROSIS 04/30/2007   Stroke (HCC)    Wears glasses     Assessment/Plan: 3 Days Post-Op Procedure(s) (LRB): ANTERIOR TOTAL HIP ARTHROPLASTY (Right) Principal Problem:   S/P right hip fracture Active Problems:   Leukocytosis   Acute hypoxic respiratory failure (HCC)   Hyperthyroidism   Hypertension   Chronic obstructive pulmonary disease (HCC)   Fever   Constipation   PNA (pneumonia)   Acute urinary retention  Estimated body mass index is 23.16 kg/m as calculated from the following:   Height as of this encounter: 5\' 3"  (1.6 m).   Weight as of this encounter: 59.3 kg. Up with therapy  DVT Prophylaxis - Aspirin Weight-bearing as tolerated  Continue mobilization with PT Disposition per medical team  Arther Abbott, PA-C Orthopedic Surgery [phone] 02/09/2023, 8:52 AM

## 2023-02-09 NOTE — Progress Notes (Signed)
PROGRESS NOTE    Ariel Lambert  ZOX:[id number] DOB: Jul 17, 1936 DOA: 02/05/2023 PCP: Mila Palmer, MD   Chief Complaint  Patient presents with   Fall    Brief Narrative:  Patient is a pleasant 87 year old female history of hypertension, hyperlipidemia, osteoporosis, hypothyroidism, carotid artery stenosis status post carotid endarterectomy, asthma presented to the ED after mechanical fall.  Plain films of the right hip and pelvis reviewed mild displaced subcapital femoral fracture on the right.  Patient seen in consultation by orthopedics and patient for probable surgical repair 02/06/2023.   Assessment & Plan:   Principal Problem:   S/P right hip fracture Active Problems:   Leukocytosis   Acute hypoxic respiratory failure (HCC)   Hyperthyroidism   Hypertension   Chronic obstructive pulmonary disease (HCC)   Fever   Constipation   PNA (pneumonia)   Acute urinary retention  #1 right hip fracture -Second due to mechanical fall -Patient seen in consultation by orthopedics and patient status post right hip hemiarthroplasty 02/06/2023.  -Continue IV fluids, IV antiemetics. -Continue scheduled Tylenol 1000 mg 3 times daily, IV Dilaudid as needed, oxycodone as needed, Robaxin as needed. -Supportive care. -Postop DVT prophylaxis and pain management per orthopedics.  2.  Acute hypoxic respiratory failure in the setting of opioid use -Patient noted not to be on oxygen supplementation at baseline. -Patient to have received fentanyl in the ED leading to hypoxia and patient maintained on nasal cannula. -Chest x-ray ordered initially, negative for any acute infiltrate or pulmonary edema. -Repeat chest x-ray ordered 02/07/2023 due to high fevers concerning for pneumonia. -Continue pulmonary toileting with incentive spirometry. -Flutter valve. -Continue bronchodilators. -IV antibiotics.  3.  Leukocytosis -Likely reactive leukocytosis secondary to problem #1 versus secondary  to aspiration pneumonia. -Patient with fevers. -Initial chest x-ray obtained on admission with no acute infiltrate.   -Repeat chest x-ray obtained 02/07/2023 with concerns for infiltrate. -Urinalysis nitrite negative, leukocytes negative. -Leukocytosis trended down. -Check CT chest, CT right hip and pelvis. -Continue empiric IV Unasyn due to concerns for aspiration pneumonia.  4.  Hyperthyroidism -Tapazole.   5.  COPD -Stable. -Continue Dulera, Incruse, Claritin.   -Xopenex nebs as needed.    6.  Hypertension -Patient started on decreased dose of Avapro. -BP soft/borderline as such Avapro discontinued.  -Blood pressure soft again today.   -Continue IV fluids.   -Patient to receive transfusion of 2 units PRBCs -IV hydralazine prn..  7.  Dehydration -IV fluids.  8.  Constipation -Patient with bowel movements on current bowel regimen of MiraLAX twice daily, Senokot-S twice daily.  9.  Fever/probable aspiration pneumonia -Patient noted to spike a temp of 103 the morning of 02/07/2023 and the evening of 02/07/2023.   -Patient with temp of 102.4 the evening of 02/08/2023 despite being on empiric IV antibiotics of Unasyn. -Chest x-ray done concerning for probable pneumonia.   -Urinalysis nitrite negative leukocytes negative.   -Blood cultures pending with no growth to date x 2 days..   -Urine strep pneumococcus antigen negative.  Urine Legionella antigen pending.  -Will check a CT of the right hip and pelvis, CT chest. -Discussed with ID. -Continue empiric IV Unasyn.   -Pulmonary toileting.   -Supportive care.  10.  Postop acute blood loss anemia -Hematoma noted on op report that was evacuated. -Patient with no overt bleeding. -Anemia panel with iron level of 9, TIBC of 175, ferritin of 141, folate of 29.9. -Hemoglobin at 7.0 this morning from 12.7 on admission.   -Patient with soft/borderline blood  pressure.   -Transfuse 2 units PRBCs.   -Follow H&H.  11.  SVT -Was  informed by RN that monitoring stated patient had a 14 beat run of SVT remained asymptomatic heart rates as high as 140s but returned back to normal sinus rhythm. -Patient with hemoglobin down to 7 being transfused 2 units packed red blood cells. -Magnesium ordered to keep magnesium approximately 2. -Potassium at 3.7, will give 40 of K-Dur p.o. x 1 to keep potassium approximately 4.      DVT prophylaxis: Postop per orthopedics. Code Status: Full Family Communication: Updated patient and daughters at bedside.  Disposition: TBD  Status is: Inpatient Remains inpatient appropriate because: Severity of illness   Consultants:  Orthopedics: Andrez Grime, PA 02/05/2023  Procedures:  Plain films right knee 02/05/2023 Chest x-ray 02/05/2023, 02/07/2023 Plain films of the right hip and pelvis 02/05/2023 Right hip hemiarthroplasty: Per Dr. Linna Caprice 02/06/2023  Antimicrobials:  Anti-infectives (From admission, onward)    Start     Dose/Rate Route Frequency Ordered Stop   02/08/23 0900  Ampicillin-Sulbactam (UNASYN) 3 g in sodium chloride 0.9 % 100 mL IVPB        3 g 200 mL/hr over 30 Minutes Intravenous Every 6 hours 02/08/23 0809     02/07/23 0600  ceFAZolin (ANCEF) IVPB 2g/100 mL premix  Status:  Discontinued        2 g 200 mL/hr over 30 Minutes Intravenous On call to O.R. 02/06/23 1230 02/06/23 1233   02/06/23 1730  ceFAZolin (ANCEF) IVPB 2g/100 mL premix        2 g 200 mL/hr over 30 Minutes Intravenous Every 6 hours 02/06/23 1643 02/07/23 0103   02/06/23 1230  ceFAZolin (ANCEF) IVPB 2g/100 mL premix        2 g 200 mL/hr over 30 Minutes Intravenous  Once 02/06/23 1220 02/06/23 1258   02/06/23 1225  ceFAZolin (ANCEF) 2-4 GM/100ML-% IVPB       Note to Pharmacy: Shanda Bumps M: cabinet override      02/06/23 1225 02/06/23 1813         Subjective: Patient laying in bed.  States had a rough night last night.  Was complaining of some abdominal discomfort hypertension.  Abdominal  pain yesterday.  No chest pain.  No shortness of breath.  Complaining of right hip pain.  Noted to spike fever last night of 102.4. Currently getting transfusion of PRBCs.  Objective: Vitals:   02/09/23 1100 02/09/23 1300 02/09/23 1415 02/09/23 1420  BP: 113/71 119/77 122/84   Pulse: 85     Resp: 14     Temp: 98.3 F (36.8 C) 98.2 F (36.8 C) 98.2 F (36.8 C) 98.2 F (36.8 C)  TempSrc: Oral Oral Oral Oral  SpO2: 100% 100% 100% 100%  Weight:      Height:        Intake/Output Summary (Last 24 hours) at 02/09/2023 1435 Last data filed at 02/09/2023 1415 Gross per 24 hour  Intake 2198.65 ml  Output 2700 ml  Net -501.35 ml   Filed Weights   02/05/23 0332  Weight: 59.3 kg    Examination:  General exam: NAD.  Dry mucous membranes. Respiratory system: Clear to auscultation bilaterally anterior lung fields.  No wheezes, no crackles, no rhonchi.  Decreased breath sounds in the left base.  Speaking in full sentences.   Cardiovascular system: RRR no murmurs rubs or gallops.  No JVD.  No lower extremity edema.   Gastrointestinal system: Abdomen is soft, nontender, nondistended, positive  bowel sounds.  No rebound.  No guarding.   Central nervous system: Alert and oriented. No focal neurological deficits. Extremities: Right hip with tenderness to palpation.  Some slight erythema in the right hip region.  Postop dressing in place.  Skin: No rashes, lesions or ulcers Psychiatry: Judgement and insight appear normal. Mood & affect appropriate.     Data Reviewed: I have personally reviewed following labs and imaging studies  CBC: Recent Labs  Lab 02/06/23 0351 02/07/23 0207 02/07/23 1448 02/08/23 0139 02/08/23 1338 02/09/23 0227  WBC 8.4 9.0 11.8* 9.4  --  7.7  NEUTROABS  --  7.7 10.1*  --   --  6.3  HGB 10.5* 8.9* 8.8* 8.1* 8.1* 7.0*  HCT 32.3* 28.2* 26.7* 23.9* 24.9* 21.2*  MCV 102.5* 103.7* 100.0 98.4  --  103.4*  PLT 162 122* 128* 139*  --  124*    Basic Metabolic  Panel: Recent Labs  Lab 02/05/23 0205 02/06/23 0351 02/07/23 0207 02/08/23 0139 02/09/23 0227  NA 136 134* 130* 127* 134*  K 3.8 3.9 3.5 3.3* 3.7  CL 100 105 100 98 106  CO2 23 22 20* 22 22  GLUCOSE 121* 112* 99 176* 139*  BUN 12 12 8 12 8   CREATININE 0.58 0.59 0.62 0.64 0.57  CALCIUM 8.6* 7.7* 7.4* 7.2* 6.9*  MG  --   --   --  1.8 1.7    GFR: Estimated Creatinine Clearance: 42.5 mL/min (by C-G formula based on SCr of 0.57 mg/dL).  Liver Function Tests: Recent Labs  Lab 02/08/23 1338  ALBUMIN 2.0*    CBG: No results for input(s): "GLUCAP" in the last 168 hours.   Recent Results (from the past 240 hour(s))  Surgical pcr screen     Status: Abnormal   Collection Time: 02/05/23  6:38 PM   Specimen: Nasal Mucosa; Nasal Swab  Result Value Ref Range Status   MRSA, PCR NEGATIVE NEGATIVE Final   Staphylococcus aureus POSITIVE (A) NEGATIVE Final    Comment: (NOTE) The Xpert SA Assay (FDA approved for NASAL specimens in patients 66 years of age and older), is one component of a comprehensive surveillance program. It is not intended to diagnose infection nor to guide or monitor treatment. Performed at [facility], [address], [place], [place] [address]   Culture, blood (Routine X 2) w Reflex to ID Panel     Status: None (Preliminary result)   Collection Time: 02/07/23 10:20 AM   Specimen: BLOOD RIGHT HAND  Result Value Ref Range Status   Specimen Description BLOOD RIGHT HAND  Final   Special Requests   Final    BOTTLES DRAWN AEROBIC AND ANAEROBIC Blood Culture adequate volume   Culture   Final    NO GROWTH 2 DAYS Performed at [facility], [address], [place], [place] [address]    Report Status PENDING  Incomplete  Culture, blood (Routine X 2) w Reflex to ID Panel     Status: None (Preliminary result)   Collection Time: 02/07/23 10:24 AM   Specimen: BLOOD LEFT HAND  Result Value Ref Range Status   Specimen Description BLOOD LEFT HAND  Final    Special Requests   Final    BOTTLES DRAWN AEROBIC AND ANAEROBIC Blood Culture adequate volume   Culture   Final    NO GROWTH 2 DAYS Performed at [facility], [address] [place], [place] [address]    Report Status PENDING  Incomplete  Urine Culture (for pregnant, neutropenic or urologic patients or patients with an indwelling urinary catheter)     Status: Abnormal   Collection Time: 02/07/23 12:17 PM   Specimen: Urine, Catheterized  Result Value Ref Range Status   Specimen Description URINE, CATHETERIZED  Final   Special Requests   Final    NONE Performed at [facility], [address] [place], [place] [address]    Culture 40,000 COLONIES/mL ENTEROCOCCUS FAECALIS (A)  Final   Report Status 02/09/2023 FINAL  Final   Organism ID, Bacteria ENTEROCOCCUS FAECALIS (A)  Final      Susceptibility   Enterococcus faecalis - MIC*    AMPICILLIN <=2 SENSITIVE Sensitive     NITROFURANTOIN <=16 SENSITIVE Sensitive     VANCOMYCIN 1 SENSITIVE Sensitive     * 40,000 COLONIES/mL ENTEROCOCCUS FAECALIS         Radiology Studies: No results found.      Scheduled Meds:  aspirin  81 mg Oral BID   bisacodyl  10 mg Rectal Once   Chlorhexidine Gluconate Cloth  6 each Topical Daily   docusate sodium  100 mg Oral BID   ezetimibe  10 mg Oral q1800   And   simvastatin  20 mg Oral q1800   fluticasone  2 spray Each Nare Daily   loratadine  10 mg Oral Daily   magnesium gluconate  500 mg Oral Daily   methimazole  7.5 mg Oral QODAY   mometasone-formoterol  2 puff Inhalation BID   polyethylene glycol  17 g Oral BID   potassium chloride  40 mEq Oral Once   senna  1 tablet Oral BID   umeclidinium bromide  1 puff Inhalation Daily   Continuous Infusions:  sodium chloride 100 mL/hr at 02/09/23 0307   ampicillin-sulbactam (UNASYN) IV 3 g (02/09/23 0923)   magnesium sulfate bolus IVPB     methocarbamol (ROBAXIN) IV       LOS: 4 days    Time spent: 40 minutes    Ramiro Harvest, MD [facility]   To contact the attending provider between 7A-7P or the covering provider during after hours 7P-7A, please log into the web site [email or web address] and access using universal [place] password for that web site. If you do not have the password, please call the hospital operator.  02/09/2023, 2:35 PM

## 2023-02-09 NOTE — Evaluation (Signed)
Occupational Therapy Evaluation Patient Details Name: Ariel Lambert MRN: [id number] DOB: April 16, 1936 Today's Date: 02/09/2023   History of Present Illness Patient is 87 y.o. female presented after fall with Rt femoral neck fracture now s/p Rt hemiarthroplasty anterior approach on 02/06/23. PMH significant for asthma, OA, CKD, CVA, COPD, HTN, hyperthyroidism, osteoporosis, Lt TKA in 2023.   Clinical Impression   Patient admitted for the diagnosis above.  PTA she lived at home alone, used a [facility] for mobility, and completed her own ADL and iADL.  Pain to right leg is the primary deficit, currently she needs total assist for lower body ADL, and use of Mod A of two and Stedy for basic transfers.  OT is indicated in the acute setting to address deficits, and Patient will benefit from continued inpatient follow up therapy, <3 hours/day.  Patient would need 24 hour Max A to return home without post acute rehab.          Recommendations for follow up therapy are one component of a multi-disciplinary discharge planning process, led by the attending physician.  Recommendations may be updated based on patient status, additional functional criteria and insurance authorization.   Assistance Recommended at Discharge Frequent or constant Supervision/Assistance  Patient can return home with the following Help with stairs or ramp for entrance;Two people to help with walking and/or transfers;Assistance with cooking/housework;Assist for transportation;A lot of help with bathing/dressing/bathroom    Functional Status Assessment  Patient has had a recent decline in their functional status and demonstrates the ability to make significant improvements in function in a reasonable and predictable amount of time.  Equipment Recommendations  None recommended by OT    Recommendations for Other Services       Precautions / Restrictions Precautions Precautions: Fall Restrictions Weight Bearing Restrictions:  No RLE Weight Bearing: Weight bearing as tolerated      Mobility Bed Mobility               General bed mobility comments: up in recliner    Transfers Overall transfer level: Needs assistance Equipment used: Ambulation equipment used Transfers: Sit to/from Stand Sit to Stand: +2 physical assistance, +2 safety/equipment, From elevated surface, Mod assist             Transfer via Lift Equipment: Stedy    Balance Overall balance assessment: Needs assistance Sitting-balance support: Feet supported, Bilateral upper extremity supported Sitting balance-Leahy Scale: Fair                                     ADL either performed or assessed with clinical judgement   ADL Overall ADL's : Needs assistance/impaired Eating/Feeding: Set up;Sitting   Grooming: Wash/dry hands;Wash/dry face;Bed level;Min guard   Upper Body Bathing: Moderate assistance;Sitting   Lower Body Bathing: Total assistance;Bed level   Upper Body Dressing : Moderate assistance;Sitting   Lower Body Dressing: Total assistance;Bed level   Toilet Transfer: Moderate assistance;+2 for safety/equipment;+2 for physical assistance;BSC/3in1 Toilet Transfer Details (indicate cue type and reason): use of Psychologist, educational and Hygiene: Total assistance;Bed level               Vision Patient Visual Report: No change from baseline       Perception     Praxis      Pertinent Vitals/Pain Pain Assessment Faces Pain Scale: Hurts even more Pain Location: Rt hip/leg Pain Descriptors / Indicators: Grimacing, Guarding Pain Intervention(s): Monitored  during session     Hand Dominance Right   Extremity/Trunk Assessment Upper Extremity Assessment Upper Extremity Assessment: Generalized weakness   Lower Extremity Assessment Lower Extremity Assessment: Defer to PT evaluation   Cervical / Trunk Assessment Cervical / Trunk Assessment: Kyphotic   Communication  Communication Communication: HOH   Cognition Arousal/Alertness: Awake/alert Behavior During Therapy: Flat affect Overall Cognitive Status: Within Functional Limits for tasks assessed                                       General Comments   VSS on RA    Exercises     Shoulder Instructions      Home Living Family/patient expects to be discharged to:: Private residence Living Arrangements: Alone Available Help at Discharge: Family;Available PRN/intermittently Type of Home: House Home Access: Stairs to enter [employer] of Steps: 3 Entrance Stairs-Rails: Left Home Layout: One level     Bathroom Shower/Tub: Tub/shower unit;Walk-in shower   Bathroom Toilet: Standard Bathroom Accessibility: Yes How Accessible: Accessible via walker Home Equipment: Rolling Walker (2 wheels);BSC/3in1;Shower seat;Cane - single point;Grab bars - tub/shower          Prior Functioning/Environment Prior Level of Function : Independent/Modified Independent;Driving             Mobility Comments: PTA pt wakling with SPC ADLs Comments: no assist with ADL or iADL        OT Problem List: Decreased strength;Decreased range of motion;Decreased activity tolerance;Impaired balance (sitting and/or standing);Pain      OT Treatment/Interventions: Self-care/ADL training;Therapeutic activities;Patient/family education;DME and/or AE instruction;Balance training    OT Goals(Current goals can be found in the care plan section) Acute Rehab OT Goals Patient Stated Goal: Return home OT Goal Formulation: With patient Time For Goal Achievement: 02/23/23 Potential to Achieve Goals: Fair ADL Goals Pt Will Perform Grooming: with set-up;sitting Pt Will Perform Upper Body Dressing: with set-up;sitting Pt Will Perform Lower Body Dressing: with mod assist;sitting/lateral leans;with adaptive equipment Pt Will Transfer to Toilet: with mod assist;stand pivot transfer;bedside commode  OT  Frequency: Min 2X/week    Co-evaluation              AM-PAC OT "6 Clicks" Daily Activity     Outcome Measure Help from another person eating meals?: A Little Help from another person taking care of personal grooming?: A Little Help from another person toileting, which includes using toliet, bedpan, or urinal?: Total Help from another person bathing (including washing, rinsing, drying)?: A Lot Help from another person to put on and taking off regular upper body clothing?: A Lot Help from another person to put on and taking off regular lower body clothing?: Total 6 Click Score: 12   End of Session Nurse Communication: Mobility status  Activity Tolerance: Patient limited by pain Patient left: in chair;with call bell/phone within reach;with family/visitor present  OT Visit Diagnosis: Unsteadiness on feet (R26.81);Muscle weakness (generalized) (M62.81);Pain Pain - Right/Left: Right Pain - part of body: Leg;Hip                Time: 1610-9604 OT Time Calculation (min): 12 min Charges:  OT General Charges $OT Visit: 1 Visit OT Evaluation $OT Eval Moderate Complexity: 1 Mod  02/09/2023  RP, OTR/L  Acute Rehabilitation Services  Office:  [phone]   Suzanna Obey 02/09/2023, 4:43 PM

## 2023-02-09 NOTE — Progress Notes (Signed)
Paged Dr. Harrold Donath Rama. Family concerned about PNA and asking for a chest x-ray. Pt's SBP are soft 80's-90's. Pt also needs pain medication and urine retention needs to be address. Passed the information to [employer], Charity fundraiser. Evan paged Dr. Janee Morn. Waiting to hear.

## 2023-02-09 NOTE — Progress Notes (Signed)
Physical Therapy Treatment Patient Details Name: Ariel Lambert MRN: [id number] DOB: 1936/01/28 Today's Date: 02/09/2023   History of Present Illness Patient is 87 y.o. female presented after fall with Rt femoral neck fracture now s/p Rt hemiarthroplasty anterior approach on 02/06/23. PMH significant for asthma, OA, CKD, CVA, COPD, HTN, hyperthyroidism, osteoporosis, Lt TKA in 2023.    PT Comments    Patient resting in bed and reports has not mobilized since last PT visit. Pt highly motivated to progress despite pain in Lt hip and stiffness. Pt initiated bringing LE's of EOB and reaching for bed rail, Max assist required to pivot with bed pad and sit to EOB. Pt unable to stand with RW and Stedy provided for sit<>stand and OOB transfer. Pt able to power up with +2 mod assist for sit<>stand from highly elevated EOB. BP stable throughout and pt transferred to recliner and positioned for comfort. Educated on ankle pumps while seated in chair to facilitation circulation. Will continue to progress pt as able.   Recommendations for follow up therapy are one component of a multi-disciplinary discharge planning process, led by the attending physician.  Recommendations may be updated based on patient status, additional functional criteria and insurance authorization.  Follow Up Recommendations       Assistance Recommended at Discharge Frequent or constant Supervision/Assistance  Patient can return home with the following Two people to help with walking and/or transfers;Two people to help with bathing/dressing/bathroom;Assistance with cooking/housework;Direct supervision/assist for medications management;Assist for transportation;Help with stairs or ramp for entrance   Equipment Recommendations   (TBD at next venue)    Recommendations for Other Services Rehab consult;OT consult     Precautions / Restrictions Precautions Precautions: Fall Restrictions Weight Bearing Restrictions: No RLE  Weight Bearing: Weight bearing as tolerated     Mobility  Bed Mobility Overal bed mobility: Needs Assistance Bed Mobility: Supine to Sit     Supine to sit: Max assist, HOB elevated     General bed mobility comments: Pt with some recall for sequencing supine>sit EOB initiating walking Lt LE to EOB and reaching for rail with bil UE. Overall Max Assist required to pivot hips with bed pad, bring feet off side, and raise trunk upright.    Transfers Overall transfer level: Needs assistance Equipment used: Ambulation equipment used Transfers: Sit to/from Stand, Bed to chair/wheelchair/BSC Sit to Stand: +2 physical assistance, +2 safety/equipment, From elevated surface, Mod assist           General transfer comment: Attempted sit<>stand with RW and Max assist. pt unable to power up even with EOB elevated. Stedy provided and +2 Mod-Max assist for rise with significantly elevated EOB. Mod+2 to rise from stedy paddles and lower into recliner for bed>chair transfer. BP stable throughout. Transfer via Lift Equipment: Stedy  Ambulation/Gait                   Stairs             Wheelchair Mobility    Modified Rankin (Stroke Patients Only)       Balance Overall balance assessment: Needs assistance Sitting-balance support: Feet supported, Bilateral upper extremity supported Sitting balance-Leahy Scale: Fair     Standing balance support: Bilateral upper extremity supported Standing balance-Leahy Scale: Poor                              Cognition Arousal/Alertness: Awake/alert Behavior During Therapy: WFL for tasks assessed/performed Overall Cognitive  Status: Within Functional Limits for tasks assessed                                          Exercises      General Comments        Pertinent Vitals/Pain Pain Assessment Pain Assessment: Faces Faces Pain Scale: Hurts even more Pain Location: Rt hip/leg Pain Descriptors /  Indicators: Aching, Discomfort, Grimacing Pain Intervention(s): Limited activity within patient's tolerance, Monitored during session, Repositioned, RN gave pain meds during session    Home Living                          Prior Function            PT Goals (current goals can now be found in the care plan section) Acute Rehab PT Goals Patient Stated Goal: for pt to get stronger before returning home PT Goal Formulation: With patient/family Time For Goal Achievement: 02/21/23 Potential to Achieve Goals: Good Progress towards PT goals: Progressing toward goals    Frequency    Min 3X/week      PT Plan Current plan remains appropriate    Co-evaluation              AM-PAC PT "6 Clicks" Mobility   Outcome Measure  Help needed turning from your back to your side while in a flat bed without using bedrails?: A Lot Help needed moving from lying on your back to sitting on the side of a flat bed without using bedrails?: A Lot Help needed moving to and from a bed to a chair (including a wheelchair)?: Total Help needed standing up from a chair using your arms (e.g., wheelchair or bedside chair)?: Total Help needed to walk in hospital room?: Total Help needed climbing 3-5 steps with a railing? : Total 6 Click Score: 8    End of Session Equipment Utilized During Treatment: Gait belt;Oxygen (pt on 3L throughout session) Activity Tolerance: Patient tolerated treatment well Patient left: with call bell/phone within reach;with family/visitor present;in chair;with chair alarm set Nurse Communication: Mobility status;Need for lift equipment (Stedy +2) PT Visit Diagnosis: Other abnormalities of gait and mobility (R26.89);Muscle weakness (generalized) (M62.81);History of falling (Z91.81);Difficulty in walking, not elsewhere classified (R26.2);Pain Pain - Right/Left: Right Pain - part of body: Hip     Time: 1610-9604 PT Time Calculation (min) (ACUTE ONLY): 44  min  Charges:  $Therapeutic Activity: 38-52 mins                     Wynn Maudlin, DPT Acute Rehabilitation Services Office [phone]  02/09/23 1:27 PM

## 2023-02-10 DIAGNOSIS — J9601 Acute respiratory failure with hypoxia: Secondary | ICD-10-CM | POA: Diagnosis not present

## 2023-02-10 DIAGNOSIS — E0590 Thyrotoxicosis, unspecified without thyrotoxic crisis or storm: Secondary | ICD-10-CM | POA: Diagnosis not present

## 2023-02-10 DIAGNOSIS — I10 Essential (primary) hypertension: Secondary | ICD-10-CM | POA: Diagnosis not present

## 2023-02-10 DIAGNOSIS — Z8781 Personal history of (healed) traumatic fracture: Secondary | ICD-10-CM | POA: Diagnosis not present

## 2023-02-10 LAB — MAGNESIUM: Magnesium: 2.7 mg/dL — ABNORMAL HIGH (ref 1.7–2.4)

## 2023-02-10 LAB — CBC WITH DIFFERENTIAL/PLATELET
Abs Immature Granulocytes: 0.04 10*3/uL (ref 0.00–0.07)
Basophils Absolute: 0 10*3/uL (ref 0.0–0.1)
Basophils Relative: 0 %
Eosinophils Absolute: 0 10*3/uL (ref 0.0–0.5)
Eosinophils Relative: 0 %
HCT: 26.9 % — ABNORMAL LOW (ref 36.0–46.0)
Hemoglobin: 9.1 g/dL — ABNORMAL LOW (ref 12.0–15.0)
Immature Granulocytes: 1 %
Lymphocytes Relative: 9 %
Lymphs Abs: 0.7 10*3/uL (ref 0.7–4.0)
MCH: 31.7 pg (ref 26.0–34.0)
MCHC: 33.8 g/dL (ref 30.0–36.0)
MCV: 93.7 fL (ref 80.0–100.0)
Monocytes Absolute: 0.6 10*3/uL (ref 0.1–1.0)
Monocytes Relative: 7 %
Neutro Abs: 6.8 10*3/uL (ref 1.7–7.7)
Neutrophils Relative %: 83 %
Platelets: 159 10*3/uL (ref 150–400)
RBC: 2.87 MIL/uL — ABNORMAL LOW (ref 3.87–5.11)
RDW: 16 % — ABNORMAL HIGH (ref 11.5–15.5)
WBC: 8.1 10*3/uL (ref 4.0–10.5)
nRBC: 0 % (ref 0.0–0.2)

## 2023-02-10 LAB — BASIC METABOLIC PANEL
Anion gap: 7 (ref 5–15)
BUN: 6 mg/dL — ABNORMAL LOW (ref 8–23)
CO2: 21 mmol/L — ABNORMAL LOW (ref 22–32)
Calcium: 7.1 mg/dL — ABNORMAL LOW (ref 8.9–10.3)
Chloride: 102 mmol/L (ref 98–111)
Creatinine, Ser: 0.48 mg/dL (ref 0.44–1.00)
GFR, Estimated: >60 mL/min (ref >60)
Glucose, Bld: 101 mg/dL — ABNORMAL HIGH (ref 70–99)
Potassium: 4 mmol/L (ref 3.5–5.1)
Sodium: 130 mmol/L — ABNORMAL LOW (ref 135–145)

## 2023-02-10 LAB — TYPE AND SCREEN
ABO/RH(D): O POS
Unit division: 0
Unit division: 0

## 2023-02-10 LAB — BPAM RBC
ISSUE DATE / TIME: 202405271045
ISSUE DATE / TIME: 202405271408
Unit Type and Rh: 5100
Unit Type and Rh: 5100

## 2023-02-10 LAB — CULTURE, BLOOD (ROUTINE X 2)

## 2023-02-10 MED ORDER — CHLORHEXIDINE GLUCONATE CLOTH 2 % EX PADS
6.0000 | MEDICATED_PAD | Freq: Every day | CUTANEOUS | Status: DC
  Administered 2023-02-11: 6 via TOPICAL

## 2023-02-10 MED ORDER — HYDROCODONE-ACETAMINOPHEN 5-325 MG PO TABS
1.0000 | ORAL_TABLET | ORAL | Status: DC | PRN
  Administered 2023-02-10 – 2023-02-11 (×5): 1 via ORAL
  Filled 2023-02-10 (×6): qty 1

## 2023-02-10 MED ORDER — OXYCODONE HCL 5 MG PO TABS
5.0000 mg | ORAL_TABLET | Freq: Once | ORAL | Status: AC
  Administered 2023-02-10: 5 mg via ORAL
  Filled 2023-02-10: qty 1

## 2023-02-10 MED ORDER — ASPIRIN 81 MG PO CHEW: 81.0000 mg | CHEWABLE_TABLET | Freq: Two times a day (BID) | ORAL | 0 refills | Status: AC

## 2023-02-10 MED ORDER — MUPIROCIN 2 % EX OINT
1.0000 | TOPICAL_OINTMENT | Freq: Two times a day (BID) | CUTANEOUS | Status: DC
  Administered 2023-02-10 – 2023-02-11 (×3): 1 via NASAL
  Filled 2023-02-10: qty 22

## 2023-02-10 NOTE — Progress Notes (Signed)
Physical Therapy Treatment Patient Details Name: Ariel Lambert MRN: [id number] DOB: 12-Jul-1936 Today's Date: 02/10/2023   History of Present Illness Patient is 87 y.o. female presented after fall with Rt femoral neck fracture now s/p Rt hemiarthroplasty anterior approach on 02/06/23. PMH significant for asthma, OA, CKD, CVA, COPD, HTN, hyperthyroidism, osteoporosis, Lt TKA in 2023.    PT Comments    Patient received with nursing staff in room finishing with bath. Patient is agreeable to PT session. Her pain medicine order had been discontinued at some point inadvertently, so this am she was in a lot of pain.  She has since received pain medicine. She continues to be in a lot of pain, but somewhat better. Patient needs mod A for supine to sit edge of bed. Mod A to move R LE and to elevate trunk to sitting. She is able to stand with stedy with mod A +2 and transfer to reclined via equipment. She will continue to benefit from skilled PT to improve independence and strength.      Recommendations for follow up therapy are one component of a multi-disciplinary discharge planning process, led by the attending physician.  Recommendations may be updated based on patient status, additional functional criteria and insurance authorization.  Follow Up Recommendations       Assistance Recommended at Discharge Frequent or constant Supervision/Assistance  Patient can return home with the following Two people to help with walking and/or transfers;Two people to help with bathing/dressing/bathroom;Assistance with cooking/housework;Direct supervision/assist for medications management;Assist for transportation;Help with stairs or ramp for entrance   Equipment Recommendations  Other (comment) (TBD)    Recommendations for Other Services OT consult;Rehab consult     Precautions / Restrictions Precautions Precautions: Fall Restrictions Weight Bearing Restrictions: Yes RLE Weight Bearing: Weight  bearing as tolerated     Mobility  Bed Mobility Overal bed mobility: Needs Assistance Bed Mobility: Supine to Sit     Supine to sit: Mod assist          Transfers Overall transfer level: Needs assistance Equipment used: Ambulation equipment used Transfers: Sit to/from Stand Sit to Stand: Mod assist, +2 physical assistance, From elevated surface, +2 safety/equipment           General transfer comment: Mod +2 assist for standing from elevated bed. Stedy used to transfer to [employer] Mod A needed to go from standing to sitting Transfer via Lift Equipment: Stedy  Ambulation/Gait               General Gait Details: unable   Stairs             Wheelchair Mobility    Modified Rankin (Stroke Patients Only)       Balance Overall balance assessment: Needs assistance Sitting-balance support: Feet supported, Bilateral upper extremity supported Sitting balance-Leahy Scale: Fair     Standing balance support: Bilateral upper extremity supported, During functional activity, Reliant on assistive device for balance Standing balance-Leahy Scale: Poor                              Cognition Arousal/Alertness: Awake/alert Behavior During Therapy: WFL for tasks assessed/performed Overall Cognitive Status: Within Functional Limits for tasks assessed                                          Exercises  General Comments        Pertinent Vitals/Pain Pain Assessment Faces Pain Scale: Hurts even more Breathing: occasional labored breathing, short period of hyperventilation Negative Vocalization: occasional moan/groan, low speech, negative/disapproving quality Facial Expression: facial grimacing Body Language: tense, distressed pacing, fidgeting Consolability: distracted or reassured by voice/touch PAINAD Score: 6 Pain Location: Rt hip/leg Pain Descriptors / Indicators: Grimacing, Guarding, Discomfort, Moaning Pain  Intervention(s): Monitored during session, Premedicated before session, Repositioned, Ice applied    Home Living                          Prior Function            PT Goals (current goals can now be found in the care plan section) Acute Rehab PT Goals Patient Stated Goal: for pt to get stronger before returning home PT Goal Formulation: With patient/family Time For Goal Achievement: 02/21/23 Potential to Achieve Goals: Fair Progress towards PT goals: Progressing toward goals    Frequency    Min 3X/week      PT Plan Current plan remains appropriate    Co-evaluation              AM-PAC PT "6 Clicks" Mobility   Outcome Measure  Help needed turning from your back to your side while in a flat bed without using bedrails?: A Lot Help needed moving from lying on your back to sitting on the side of a flat bed without using bedrails?: A Lot Help needed moving to and from a bed to a chair (including a wheelchair)?: Total Help needed standing up from a chair using your arms (e.g., wheelchair or bedside chair)?: Total Help needed to walk in hospital room?: Total Help needed climbing 3-5 steps with a railing? : Total 6 Click Score: 8    End of Session Equipment Utilized During Treatment: Oxygen Activity Tolerance: Patient limited by pain Patient left: in chair;with call bell/phone within reach Nurse Communication: Mobility status;Need for lift equipment PT Visit Diagnosis: Other abnormalities of gait and mobility (R26.89);Pain;History of falling (Z91.81);Unsteadiness on feet (R26.81) Pain - Right/Left: Right Pain - part of body: Hip     Time: 1315-1330 PT Time Calculation (min) (ACUTE ONLY): 15 min  Charges:  $Therapeutic Activity: 8-22 mins                     Cadan Maggart, PT, GCS 02/10/23,1:41 PM

## 2023-02-10 NOTE — Progress Notes (Signed)
PT Cancellation Note  Patient Details Name: PANDORA BALCAZAR MRN: [id number] DOB: 1936/01/28   Cancelled Treatment:    Reason Eval/Treat Not Completed: Pain limiting ability to participate. Patient is moaning in pain. Family at bedside report pain medicine needs to be approved. Provided patient with ice packs. Will re-attempt later as appropriate.     Altheia Shafran 02/10/2023, 9:30 AM

## 2023-02-10 NOTE — Progress Notes (Signed)
CT R hip ordered by [facility] due to leukocytosis. CT shows normal postop findings - infection is impossible this soon after surgery. Patient noted to have aspiration PNA, which is most certainly the cause of her leukocytosis, which has improved. Recommend medical treatment of PNA.

## 2023-02-10 NOTE — Progress Notes (Signed)
PROGRESS NOTE    Ariel Lambert  WUJ:[id number] DOB: 1936/03/28 DOA: 02/05/2023 PCP: Mila Palmer, MD   Chief Complaint  Patient presents with   Fall    Brief Narrative:  Patient is a pleasant 87 year old female history of hypertension, hyperlipidemia, osteoporosis, hypothyroidism, carotid artery stenosis status post carotid endarterectomy, asthma presented to the ED after mechanical fall.  Plain films of the right hip and pelvis reviewed mild displaced subcapital femoral fracture on the right.  Patient seen in consultation by orthopedics and patient for probable surgical repair 02/06/2023.   Assessment & Plan:   Principal Problem:   S/P right hip fracture Active Problems:   Leukocytosis   Acute hypoxic respiratory failure (HCC)   Hyperthyroidism   Hypertension   Chronic obstructive pulmonary disease (HCC)   Fever   Constipation   PNA (pneumonia)   Acute urinary retention  #1 right hip fracture -Second due to mechanical fall -Patient seen in consultation by orthopedics and patient status post right hip hemiarthroplasty 02/06/2023.  -Continue IV fluids, IV antiemetics. -Continue scheduled Tylenol 1000 mg 3 times daily, IV Dilaudid as needed, Robaxin as needed. -Patient with significant pain this morning, as needed pain medications have timed out and as such we will order a one-time dose of oxycodone 5 mg p.o. x 1 while awaiting pain regimen from orthopedics. -Supportive care. -Postop DVT prophylaxis and pain management per orthopedics.  2.  Acute hypoxic respiratory failure in the setting of opioid use -Patient noted not to be on oxygen supplementation at baseline. -Patient to have received fentanyl in the ED leading to hypoxia and patient maintained on nasal cannula. -Chest x-ray ordered initially, negative for any acute infiltrate or pulmonary edema. -Repeat chest x-ray ordered 02/07/2023 due to high fevers concerning for pneumonia. -Continue pulmonary toileting  with incentive spirometry. -Flutter valve. -Continue bronchodilators. -IV antibiotics.  3.  Leukocytosis -Likely reactive leukocytosis secondary to problem #1 versus secondary to aspiration pneumonia. -Patient with fevers and now afebrile x 24 hours. -Initial chest x-ray obtained on admission with no acute infiltrate.   -Repeat chest x-ray obtained 02/07/2023 with concerns for infiltrate. -Urinalysis nitrite negative, leukocytes negative. -Leukocytosis trended down and currently resolved. -CT chest concerning for left basilar infiltrate and small bilateral effusions.  - CT right hip done status post right hip arthroplasty with intact hardware, soft tissue edema multiple foci of gas in the lateral aspect of the femoral component in the gluteal region as well as about the distal aspect of the femoral component may represent postsurgical changes in this patient with recent hip replacement.  Marked subcutaneous soft tissue edema about the lateral aspect of the hip joint/gluteal region.  Fluid collection about the site of prior surgical incision measuring up to 5.8 x 3.5 cm. -Continue empiric IV Unasyn due to concerns for aspiration pneumonia.  4.  Hyperthyroidism -Continue Tapazole.    5.  COPD -Stable. -Continue Dulera, Incruse, Claritin.   -Xopenex nebs as needed.    6.  Hypertension -Patient started on decreased dose of Avapro. -BP soft/borderline as such Avapro discontinued.  -Blood pressure improved after transfusion of PRBCs and IV fluids.   -Follow.    7.  Dehydration -Status post IV fluids.  8.  Constipation -Continue bowel regimen of MiraLAX twice daily, Senokot as twice daily.   9.  Fever/probable aspiration pneumonia -Patient noted to spike a temp of 103 the morning of 02/07/2023 and the evening of 02/07/2023.   -Patient with temp of 102.4 the evening of 02/08/2023 despite being on  empiric IV antibiotics of Unasyn. -Chest x-ray done concerning for probable pneumonia.    -Urinalysis nitrite negative leukocytes negative.   -Blood cultures pending with no growth to date x 3 days..   -Urine strep pneumococcus antigen negative.  Urine Legionella antigen pending.  -CT chest done consistent with left basilar infiltrate and small bilateral pleural effusions.   -CT right hip done status post right hip arthroplasty with intact hardware, soft tissue edema multiple foci of gas in the lateral aspect of the femoral component in the gluteal region as well as about the distal aspect of the femoral component may represent postsurgical changes in this patient with recent hip replacement.  Marked subcutaneous soft tissue edema about the lateral aspect of the hip joint/gluteal region.  Fluid collection about the site of prior surgical incision measuring up to 5.8 x 3.5 cm. -Patient afebrile x 24 hours now. -Continue empiric IV Unasyn, pulmonary toileting, supportive care. -Orthopedics to weigh in on CT right hip findings. -Supportive care.  10.  Postop acute blood loss anemia -Hematoma noted on op report that was evacuated. -Patient with no overt bleeding. -Anemia panel with iron level of 9, TIBC of 175, ferritin of 141, folate of 29.9. -Hemoglobin at 7.0 (02/09/2023) from 12.7 on admission.  -Status post transfusion 2 units PRBCs with hemoglobin currently at 9.1. -Follow H&H.  11.  SVT -Was informed by RN that monitoring stated patient had a 14 beat run of SVT (02/09/2023) remained asymptomatic heart rates as high as 140s but returned back to normal sinus rhythm. -Patient with hemoglobin down to 7 being status post transfusion 2 units PRBCs 02/09/2023 with repeat hemoglobin currently at 9.1.   -Magnesium repleted currently at 2.7, potassium of 4.0.   -Follow.        DVT prophylaxis: Postop per orthopedics. Code Status: Full Family Communication: Updated patient and daughter at bedside.  Disposition: TBD  Status is: Inpatient Remains inpatient appropriate because:  Severity of illness   Consultants:  Orthopedics: Andrez Grime, PA 02/05/2023  Procedures:  Plain films right knee 02/05/2023 Chest x-ray 02/05/2023, 02/07/2023 Plain films of the right hip and pelvis 02/05/2023 Right hip hemiarthroplasty: Per Dr. Linna Caprice 02/06/2023 CT chest 02/09/2023 CT right hip 02/09/2023 Transfused 2 units PRBCs 02/09/2023.  Antimicrobials:  Anti-infectives (From admission, onward)    Start     Dose/Rate Route Frequency Ordered Stop   02/08/23 0900  Ampicillin-Sulbactam (UNASYN) 3 g in sodium chloride 0.9 % 100 mL IVPB        3 g 200 mL/hr over 30 Minutes Intravenous Every 6 hours 02/08/23 0809     02/07/23 0600  ceFAZolin (ANCEF) IVPB 2g/100 mL premix  Status:  Discontinued        2 g 200 mL/hr over 30 Minutes Intravenous On call to O.R. 02/06/23 1230 02/06/23 1233   02/06/23 1730  ceFAZolin (ANCEF) IVPB 2g/100 mL premix        2 g 200 mL/hr over 30 Minutes Intravenous Every 6 hours 02/06/23 1643 02/07/23 0103   02/06/23 1230  ceFAZolin (ANCEF) IVPB 2g/100 mL premix        2 g 200 mL/hr over 30 Minutes Intravenous  Once 02/06/23 1220 02/06/23 1258   02/06/23 1225  ceFAZolin (ANCEF) 2-4 GM/100ML-% IVPB       Note to Pharmacy: Shanda Bumps M: cabinet override      02/06/23 1225 02/06/23 1813         Subjective: Laying in bed.  Daughter at bedside.  States had a  bad night last night and not feeling great today.  Complaining of severe pain in the right hip.  Daughter at bedside frustrated patient has been unable to get pain medications and was only able to get Tylenol.  No chest pain.  No shortness of breath.  No abdominal pain.  Tolerating oral intake.   Objective: Vitals:   02/09/23 2100 02/09/23 2234 02/10/23 0456 02/10/23 0742  BP: (!) 115/90  110/72 (!) 149/87  Pulse: 76 84 71   Resp: 19 (!) 25 10   Temp: 97.8 F (36.6 C)  98 F (36.7 C) 99.3 F (37.4 C)  TempSrc: Oral  Axillary Oral  SpO2: 100% 98% 100%   Weight:   60.1 kg   Height:         Intake/Output Summary (Last 24 hours) at 02/10/2023 1111 Last data filed at 02/10/2023 0505 Gross per 24 hour  Intake 1171.67 ml  Output 2600 ml  Net -1428.33 ml    Filed Weights   02/05/23 0332 02/10/23 0456  Weight: 59.3 kg 60.1 kg    Examination:  General exam: NAD.  Dry mucous membranes. Respiratory system: CTAB anterior lung fields.  No wheezes, no crackles, no rhonchi.  Speaking in full sentences.  Cardiovascular system: Regular rate and rhythm no murmurs rubs or gallops.  No JVD.  No lower extremity edema.  Gastrointestinal system: Abdomen is soft, nontender, nondistended, positive bowel sounds.  No rebound.  No guarding.     Central nervous system: Alert and oriented.  Moving extremities spontaneously.  No focal neurological deficits. Extremities: Right hip with tenderness to palpation.  Ice on right hip.  Some slight erythema in the right hip region.  Postop dressing in place.  Skin: No rashes, lesions or ulcers Psychiatry: Judgement and insight appear normal. Mood & affect appropriate.     Data Reviewed: I have personally reviewed following labs and imaging studies  CBC: Recent Labs  Lab 02/07/23 0207 02/07/23 1448 02/08/23 0139 02/08/23 1338 02/09/23 0227 02/10/23 0146  WBC 9.0 11.8* 9.4  --  7.7 8.1  NEUTROABS 7.7 10.1*  --   --  6.3 6.8  HGB 8.9* 8.8* 8.1* 8.1* 7.0* 9.1*  HCT 28.2* 26.7* 23.9* 24.9* 21.2* 26.9*  MCV 103.7* 100.0 98.4  --  103.4* 93.7  PLT 122* 128* 139*  --  124* 159     Basic Metabolic Panel: Recent Labs  Lab 02/06/23 0351 02/07/23 0207 02/08/23 0139 02/09/23 0227 02/10/23 0146  NA 134* 130* 127* 134* 130*  K 3.9 3.5 3.3* 3.7 4.0  CL 105 100 98 106 102  CO2 22 20* 22 22 21*  GLUCOSE 112* 99 176* 139* 101*  BUN 12 8 12 8  6*  CREATININE 0.59 0.62 0.64 0.57 0.48  CALCIUM 7.7* 7.4* 7.2* 6.9* 7.1*  MG  --   --  1.8 1.7 2.7*     GFR: Estimated Creatinine Clearance: 42.5 mL/min (by C-G formula based on SCr of 0.48  mg/dL).  Liver Function Tests: Recent Labs  Lab 02/08/23 1338  ALBUMIN 2.0*     CBG: No results for input(s): "GLUCAP" in the last 168 hours.   Recent Results (from the past 240 hour(s))  Surgical pcr screen     Status: Abnormal   Collection Time: 02/05/23  6:38 PM   Specimen: Nasal Mucosa; Nasal Swab  Result Value Ref Range Status   MRSA, PCR NEGATIVE NEGATIVE Final   Staphylococcus aureus POSITIVE (A) NEGATIVE Final    Comment: (NOTE) The Xpert SA  Assay (FDA approved for NASAL specimens in patients 14 years of age and older), is one component of a comprehensive surveillance program. It is not intended to diagnose infection nor to guide or monitor treatment. Performed at [facility], [address], [place], [place] [address]   Culture, blood (Routine X 2) w Reflex to ID Panel     Status: None (Preliminary result)   Collection Time: 02/07/23 10:20 AM   Specimen: BLOOD RIGHT HAND  Result Value Ref Range Status   Specimen Description BLOOD RIGHT HAND  Final   Special Requests   Final    BOTTLES DRAWN AEROBIC AND ANAEROBIC Blood Culture adequate volume   Culture   Final    NO GROWTH 3 DAYS Performed at [facility], [address], [place], [place] [address]    Report Status PENDING  Incomplete  Culture, blood (Routine X 2) w Reflex to ID Panel     Status: None (Preliminary result)   Collection Time: 02/07/23 10:24 AM   Specimen: BLOOD LEFT HAND  Result Value Ref Range Status   Specimen Description BLOOD LEFT HAND  Final   Special Requests   Final    BOTTLES DRAWN AEROBIC AND ANAEROBIC Blood Culture adequate volume   Culture   Final    NO GROWTH 3 DAYS Performed at [facility], [address], [place], [place] [address]    Report Status PENDING  Incomplete  Urine Culture (for pregnant, neutropenic or urologic patients or patients with an indwelling urinary catheter)     Status: Abnormal   Collection Time: 02/07/23 12:17 PM   Specimen: Urine,  Catheterized  Result Value Ref Range Status   Specimen Description URINE, CATHETERIZED  Final   Special Requests   Final    NONE Performed at [facility], [address], [place], [place] [address]    Culture 40,000 COLONIES/mL ENTEROCOCCUS FAECALIS (A)  Final   Report Status 02/09/2023 FINAL  Final   Organism ID, Bacteria ENTEROCOCCUS FAECALIS (A)  Final      Susceptibility   Enterococcus faecalis - MIC*    AMPICILLIN <=2 SENSITIVE Sensitive     NITROFURANTOIN <=16 SENSITIVE Sensitive     VANCOMYCIN 1 SENSITIVE Sensitive     * 40,000 COLONIES/mL ENTEROCOCCUS FAECALIS         Radiology Studies: CT HIP RIGHT W CONTRAST  Result Date: 02/09/2023 CLINICAL DATA:  Hip replacement. Infection suspected. Recent hip surgery. Complication suspected. Fever. EXAM: CT OF THE LOWER RIGHT EXTREMITY WITH CONTRAST TECHNIQUE: Multidetector CT imaging of the lower right extremity was performed according to the standard protocol following intravenous contrast administration. RADIATION DOSE REDUCTION: This exam was performed according to the departmental dose-optimization program which includes automated exposure control, adjustment of the mA and/or kV according to patient size and/or use of iterative reconstruction technique. CONTRAST:  75mL OMNIPAQUE IOHEXOL 350 MG/ML SOLN COMPARISON:  Radiographs dated Feb 06, 2023 FINDINGS: Bones/Joint/Cartilage Status post right hip arthroplasty. The hardware is intact. There is soft tissue edema and multiple foci of gas in the lateral aspect of the femoral component in the gluteal region as well as about the distal aspect of the femoral component. These may represent postsurgical changes in this patient with recent hip replacement. Ligaments Suboptimally assessed by CT. Muscles and Tendons Generalized muscle atrophy of the right hip and thigh muscles. Iliopsoas tendon appears intact. Soft tissues Marked subcutaneous soft tissue edema about the lateral aspect of the  hip joint/gluteal region.  Fluid collection about the site of prior surgical incision measuring up to 5.8 x 3.5 cm. IMPRESSION: 1. Status post right hip arthroplasty. The hardware is intact. There is soft tissue edema and multiple foci of gas in the lateral aspect of the femoral component in the gluteal region as well as about the distal aspect of the femoral component. These may represent postsurgical changes in this patient with recent hip replacement, however infectious/inflammatory process can not be excluded. Clinical correlation is suggested. 2. Marked subcutaneous soft tissue edema about the lateral aspect of the hip joint/gluteal region. Fluid collection about the site of prior surgical incision measuring up to 5.8 x 3.5 cm. Electronically Signed   By: Larose Hires D.O.   On: 02/09/2023 22:36   CT CHEST W CONTRAST  Result Date: 02/09/2023 CLINICAL DATA:  Pneumonia, complication suspected, xray done fever EXAM: CT CHEST WITH CONTRAST TECHNIQUE: Multidetector CT imaging of the chest was performed during intravenous contrast administration. RADIATION DOSE REDUCTION: This exam was performed according to the departmental dose-optimization program which includes automated exposure control, adjustment of the mA and/or kV according to patient size and/or use of iterative reconstruction technique. CONTRAST:  75mL OMNIPAQUE IOHEXOL 350 MG/ML SOLN COMPARISON:  Radiograph 02/07/2023 FINDINGS: Cardiovascular: Heart size upper normal. There are coronary artery calcifications. Thoracic aorta is tortuous. There is calcified noncalcified atheromatous plaque throughout the thoracic aorta. No central pulmonary embolus on this exam not tailored to pulmonary artery assessment. No pericardial effusion. Mediastinum/Nodes: No mediastinal or hilar adenopathy. No esophageal wall thickening. Lungs/Pleura: Small left and trace right pleural effusion. Left greater than right lower lobe opacities, favoring atelectasis on the right,  atelectasis versus pneumonia on the left. No endobronchial debris suggest aspiration. No features of pulmonary edema. Upper Abdomen: Excreted IV contrast in the left renal collecting system. Musculoskeletal: Mild scoliosis and degenerative change in the spine. Early Modic endplate changes are noted at T8-T9. There is chronic bilateral shoulder arthropathy. There are no acute or suspicious osseous abnormalities. IMPRESSION: 1. Small left and trace right pleural effusions. Left greater than right lower lobe opacities, favoring atelectasis on the right, atelectasis versus pneumonia on the left. No endobronchial debris suggest aspiration. 2. Aortic atherosclerosis.  Coronary artery calcifications. Aortic Atherosclerosis (ICD10-I70.0). Electronically Signed   By: Narda Rutherford M.D.   On: 02/09/2023 20:38        Scheduled Meds:  aspirin  81 mg Oral BID   bisacodyl  10 mg Rectal Once   Chlorhexidine Gluconate Cloth  6 each Topical Daily   docusate sodium  100 mg Oral BID   ezetimibe  10 mg Oral q1800   And   simvastatin  20 mg Oral q1800   fluticasone  2 spray Each Nare Daily   loratadine  10 mg Oral Daily   magnesium gluconate  500 mg Oral Daily   methimazole  7.5 mg Oral QODAY   mometasone-formoterol  2 puff Inhalation BID   oxyCODONE  5 mg Oral Once   polyethylene glycol  17 g Oral BID   senna  1 tablet Oral BID   umeclidinium bromide  1 puff Inhalation Daily   Continuous Infusions:  ampicillin-sulbactam (UNASYN) IV 3 g (02/10/23 1001)   methocarbamol (ROBAXIN) IV       LOS: 5 days    Time spent: 40 minutes    Ramiro Harvest, MD [facility]   To contact the attending provider between 7A-7P or the covering provider during after hours 7P-7A, please log into the web site [email or web address]  and access using universal [place] password for that web site. If you do not have the password, please call the hospital operator.  02/10/2023, 11:11 AM

## 2023-02-10 NOTE — Care Management Important Message (Signed)
Important Message  Patient Details  Name: RYLEEANN RYERSON MRN: [id number] Date of Birth: 02-18-1936   Medicare Important Message Given:  Yes     Sherilyn Banker 02/10/2023, 4:23 PM

## 2023-02-10 NOTE — Progress Notes (Signed)
    Subjective:  Patient reports pain as moderate.  Denies N/V/CP/SOB/Abd pain. She reports thigh pain today. Denies any tingling or numbness in LE bilaterally.   Objective:   VITALS:   Vitals:   02/09/23 2100 02/09/23 2234 02/10/23 0456 02/10/23 0742  BP: (!) 115/90  110/72 (!) 149/87  Pulse: 76 84 71   Resp: 19 (!) 25 10   Temp: 97.8 F (36.6 C)  98 F (36.7 C) 99.3 F (37.4 C)  TempSrc: Oral  Axillary Oral  SpO2: 100% 98% 100%   Weight:   60.1 kg   Height:        Patient sitting in recliner. NAD.  Neurologically intact ABD soft Neurovascular intact Sensation intact distally Intact pulses distally Dorsiflexion/Plantar flexion intact No cellulitis present Compartment soft Aquacel dressing C/DI, no drainage on bandage. Removed to check incision. Staples intact, incision benign. No erythema or drainage. New Aquacel dressing placed. Leave intact.  Painless log rolling of the hip.  Mild pitting edema in RLE. Compression stockings not on.   Lab Results  Component Value Date   WBC 8.1 02/10/2023   HGB 9.1 (L) 02/10/2023   HCT 26.9 (L) 02/10/2023   MCV 93.7 02/10/2023   PLT 159 02/10/2023   BMET    Component Value Date/Time   NA 130 (L) 02/10/2023 0146   K 4.0 02/10/2023 0146   CL 102 02/10/2023 0146   CO2 21 (L) 02/10/2023 0146   GLUCOSE 101 (H) 02/10/2023 0146   BUN 6 (L) 02/10/2023 0146   CREATININE 0.48 02/10/2023 0146   CALCIUM 7.1 (L) 02/10/2023 0146   GFRNONAA >60 02/10/2023 0146     Assessment/Plan: 4 Days Post-Op   Principal Problem:   S/P right hip fracture Active Problems:   Leukocytosis   Acute hypoxic respiratory failure (HCC)   Hyperthyroidism   Hypertension   Chronic obstructive pulmonary disease (HCC)   Fever   Constipation   PNA (pneumonia)   Acute urinary retention  Hemoglobin 9.1 today (7.0 yesterday transfused 2 units PRBCs). Continue to monitor.   WBAT with walker DVT ppx: Aspirin, SCDs, TEDS PO pain control: Hydrocodone  5/325mg  ordered q 4 hours PRN. PT/OT: PT working with patient currently. Transferred to chair.  Dispo: Patient undercare of the medical team, disposition per their recommendation. Pain medication and DVT ppx printed in chart.   Clois Dupes, PA-C 02/10/2023, 1:36 PM   [facility]  [place] [address], [address], [place], [place] [address] Phone: [phone] [email or web address] Facebook  [employer]

## 2023-02-10 NOTE — [place] FL2 (Signed)
[place] MEDICAID FL2 LEVEL OF CARE FORM     IDENTIFICATION  Patient Name: Ariel Lambert Birthdate: 10-16-1935 Sex: female Admission Date (Current Location): 02/05/2023  [facility] and [place] Number:  Producer, television/film/video and Address:  [facility], [address], [place], [place] [address]      Provider Number: [id number]  Attending Physician Name and Address:  Rodolph Bong, MD  Relative Name and Phone Number:  Hill,Lisa Daughter [phone]  [phone]    Current Level of Care: Hospital Recommended Level of Care: Skilled Nursing Facility Prior Approval Number:    Date Approved/Denied:   PASRR Number: [id number]  Discharge Plan: SNF    Current Diagnoses: Patient Active Problem List   Diagnosis Date Noted   Acute urinary retention 02/09/2023   PNA (pneumonia) 02/08/2023   Fever 02/07/2023   Constipation 02/07/2023   S/P right hip fracture 02/05/2023   Leukocytosis 02/05/2023   Acute hypoxic respiratory failure (HCC) 02/05/2023   Hyperthyroidism 02/05/2023   Hypertension 02/05/2023   Chronic obstructive pulmonary disease (HCC) 02/05/2023   Primary osteoarthritis of left knee 09/30/2021   Anemia 10/11/2020   Carotid artery stenosis 10/11/2020   Cerebral infarction due to unspecified occlusion or stenosis of left middle cerebral artery (HCC) 10/11/2020   Cervical spondylosis without myelopathy 10/11/2020   Chronic kidney disease, stage 1 10/11/2020   Decreased estrogen level 10/11/2020   Hardening of the aorta (main artery of the heart) (HCC) 10/11/2020   Hematuria 10/11/2020   History of carotid endarterectomy 10/11/2020   Impairment of balance 10/11/2020   Insomnia 10/11/2020   Major depression in complete remission (HCC) 10/11/2020   Chronic nephritic syndrome, focal and segmental glomerular lesions 10/11/2020   Non-toxic goiter 10/11/2020   Occlusion and stenosis of bilateral carotid arteries 10/11/2020    Overactive bladder 10/11/2020   Protein calorie malnutrition (HCC) 10/11/2020   Psoriasis 10/11/2020   Vitamin D deficiency 10/11/2020   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/29/2019   Bilateral knee pain 07/01/2019   Neck nodule 06/22/2019   COPD exacerbation (HCC) 09/02/2015   Asthma exacerbation 09/02/2015   Allergic rhinitis 03/16/2013   Nontoxic multinodular goiter 12/30/2010   ASTHMA 05/03/2009   HYPOKALEMIA 06/13/2008   Thyrotoxicosis 06/17/2007   Essential hypertension 04/30/2007   OSTEOPOROSIS 04/30/2007    Orientation RESPIRATION BLADDER Height & Weight     Self, Time, Situation, Place  O2 Incontinent, Indwelling catheter Weight: 132 lb 7.9 oz (60.1 kg) Height:  5\' 3"  (160 cm)  BEHAVIORAL SYMPTOMS/MOOD NEUROLOGICAL BOWEL NUTRITION STATUS      Continent Diet (see discharge summary)  AMBULATORY STATUS COMMUNICATION OF NEEDS Skin   Total Care Verbally Surgical wounds                       Personal Care Assistance Level of Assistance  Bathing, Feeding, Dressing, Total care Bathing Assistance: Maximum assistance Feeding assistance: Limited assistance Dressing Assistance: Maximum assistance Total Care Assistance: Maximum assistance   Functional Limitations Info  Sight, Hearing, Speech Sight Info: Adequate Hearing Info: Impaired Speech Info: Adequate    SPECIAL CARE FACTORS FREQUENCY  PT (By licensed PT), OT (By licensed OT)     PT Frequency: 5x week OT Frequency: 5x week            Contractures Contractures Info: Not present    Additional Factors Info  Code Status, Allergies Code Status Info: full Allergies Info: Cetirizine, Codeine, Meperidine Hcl, Nabumetone, Naproxen, Sulfa Antibiotics, Sulfonamide Derivatives  Current Medications (02/10/2023):  This is the current hospital active medication list Current Facility-Administered Medications  Medication Dose Route Frequency Provider Last Rate Last Admin   acetaminophen (TYLENOL)  tablet 650 mg  650 mg Oral Q4H PRN Rodolph Bong, MD   650 mg at 02/10/23 0951   alum & mag hydroxide-simeth (MAALOX/MYLANTA) 200-200-20 MG/5ML suspension 30 mL  30 mL Oral Q4H PRN Swinteck, Arlys John, MD       Ampicillin-Sulbactam (UNASYN) 3 g in sodium chloride 0.9 % 100 mL IVPB  3 g Intravenous Q6H Rodolph Bong, MD 200 mL/hr at 02/10/23 1001 3 g at 02/10/23 1001   aspirin chewable tablet 81 mg  81 mg Oral BID Samson Frederic, MD   81 mg at 02/10/23 0950   bisacodyl (DULCOLAX) suppository 10 mg  10 mg Rectal Once Rodolph Bong, MD       [START ON 02/11/2023] Chlorhexidine Gluconate Cloth 2 % PADS 6 each  6 each Topical Q0600 Rodolph Bong, MD       diphenhydrAMINE (BENADRYL) 12.5 MG/5ML elixir 12.5-25 mg  12.5-25 mg Oral Q4H PRN Samson Frederic, MD   25 mg at 02/09/23 1304   docusate sodium (COLACE) capsule 100 mg  100 mg Oral BID Samson Frederic, MD   100 mg at 02/09/23 2125   ezetimibe (ZETIA) tablet 10 mg  10 mg Oral q1800 Samson Frederic, MD   10 mg at 02/09/23 1734   And   simvastatin (ZOCOR) tablet 20 mg  20 mg Oral q1800 Samson Frederic, MD   20 mg at 02/09/23 1733   fluticasone (FLONASE) 50 MCG/ACT nasal spray 2 spray  2 spray Each Nare Daily Swinteck, Arlys John, MD   2 spray at 02/10/23 0951   hydrALAZINE (APRESOLINE) injection 5 mg  5 mg Intravenous Q6H PRN Swinteck, Arlys John, MD       HYDROcodone-acetaminophen (NORCO/VICODIN) 5-325 MG per tablet 1 tablet  1 tablet Oral Q4H PRN Clint Bolder S, PA-C       hydrOXYzine (ATARAX) tablet 10 mg  10 mg Oral TID PRN Samson Frederic, MD   10 mg at 02/09/23 2006   levalbuterol (XOPENEX) nebulizer solution 0.63 mg  0.63 mg Nebulization Q6H PRN Swinteck, Arlys John, MD       loratadine (CLARITIN) tablet 10 mg  10 mg Oral Daily Swinteck, Arlys John, MD   10 mg at 02/10/23 0950   magnesium gluconate (MAGONATE) tablet 500 mg  500 mg Oral Daily Swinteck, Arlys John, MD   500 mg at 02/10/23 1610   menthol-cetylpyridinium (CEPACOL) lozenge 3 mg  1 lozenge Oral  PRN Swinteck, Arlys John, MD       Or   phenol (CHLORASEPTIC) mouth spray 1 spray  1 spray Mouth/Throat PRN Swinteck, Arlys John, MD       methIMAzole (TAPAZOLE) tablet 7.5 mg  7.5 mg Oral Golden Hurter, Arlys John, MD   7.5 mg at 02/10/23 0951   methocarbamol (ROBAXIN) tablet 500 mg  500 mg Oral Q6H PRN Samson Frederic, MD   500 mg at 02/10/23 0950   Or   methocarbamol (ROBAXIN) 500 mg in dextrose 5 % 50 mL IVPB  500 mg Intravenous Q6H PRN Swinteck, Arlys John, MD       metoCLOPramide (REGLAN) tablet 5-10 mg  5-10 mg Oral Q8H PRN Swinteck, Arlys John, MD       Or   metoCLOPramide (REGLAN) injection 5-10 mg  5-10 mg Intravenous Q8H PRN Swinteck, Arlys John, MD       mometasone-formoterol (DULERA) 200-5 MCG/ACT inhaler 2 puff  2 puff Inhalation BID Samson Frederic, MD   2 puff at 02/10/23 0952   mupirocin ointment (BACTROBAN) 2 % 1 Application  1 Application Nasal BID Rodolph Bong, MD       ondansetron [facility]) tablet 4 mg  4 mg Oral Q6H PRN Swinteck, Arlys John, MD       Or   ondansetron [facility]) injection 4 mg  4 mg Intravenous Q6H PRN Swinteck, Arlys John, MD       polyethylene glycol (MIRALAX / GLYCOLAX) packet 17 g  17 g Oral BID Rodolph Bong, MD   17 g at 02/10/23 0950   prochlorperazine (COMPAZINE) injection 5 mg  5 mg Intravenous Q6H PRN Swinteck, Arlys John, MD       senna (SENOKOT) tablet 8.6 mg  1 tablet Oral BID Samson Frederic, MD   8.6 mg at 02/10/23 0950   umeclidinium bromide (INCRUSE ELLIPTA) 62.5 MCG/ACT 1 puff  1 puff Inhalation Daily Samson Frederic, MD   1 puff at 02/10/23 1610     Discharge Medications: Please see discharge summary for a list of discharge medications.  Relevant Imaging Results:  Relevant Lab Results:   Additional Information SSN: [id number]  Lorri Frederick, LCSW

## 2023-02-10 NOTE — TOC Initial Note (Addendum)
Transition of Care [facility]) - Initial/Assessment Note    Patient Details  Name: Ariel Lambert MRN: [id number] Date of Birth: 01-Aug-1936  [facility]) CM/SW Contact:    Lorri Frederick, LCSW Phone Number: 02/10/2023, 12:54 PM  Clinical Narrative:       Pt oriented x4 but in pain, had eyes open but did not participate.  CSW spoke with pt daughter [name] in room regarding PT recommendation for SNF.  [name] in agreement with this plan, medicare choice document provided, permission given to send out referral in hub.  Pt is from home alone, no current services.  Referral sent out in hub for SNF.       1550: bed offers provided to pt and daughter.  They are requesting response from [facility] and [facility].  CSW reached out to those facilities.        Expected Discharge Plan: Skilled Nursing Facility Barriers to Discharge: Continued Medical Work up, SNF Pending bed offer   Patient Goals and CMS Choice   CMS Medicare.gov Compare Post Acute Care list provided to:: Patient Represenative (must comment) (daughter [name]) Choice offered to / list presented to : Adult Children      Expected Discharge Plan and Services In-house Referral: Clinical Social Work   Post Acute Care Choice: Skilled Nursing Facility Living arrangements for the past 2 months: Single Family Home                                      Prior Living Arrangements/Services Living arrangements for the past 2 months: Single Family Home Lives with:: Self          Need for Family Participation in Patient Care: Yes (Comment) Care giver support system in place?: Yes (comment) Current home services: Other (comment) (none) Criminal Activity/Legal Involvement Pertinent to Current Situation/Hospitalization: No - Comment as needed  Activities of Daily Living      Permission Sought/Granted                  Emotional Assessment Appearance:: Appears stated age Attitude/Demeanor/Rapport: Unable  to Assess Affect (typically observed): Unable to Assess Orientation: : Oriented to Self, Oriented to Place, Oriented to  Time, Oriented to Situation      Admission diagnosis:  Closed fracture of right hip, initial encounter (HCC) [S72.001A] S/P right hip fracture [Z87.81] Patient Active Problem List   Diagnosis Date Noted   Acute urinary retention 02/09/2023   PNA (pneumonia) 02/08/2023   Fever 02/07/2023   Constipation 02/07/2023   S/P right hip fracture 02/05/2023   Leukocytosis 02/05/2023   Acute hypoxic respiratory failure (HCC) 02/05/2023   Hyperthyroidism 02/05/2023   Hypertension 02/05/2023   Chronic obstructive pulmonary disease (HCC) 02/05/2023   Primary osteoarthritis of left knee 09/30/2021   Anemia 10/11/2020   Carotid artery stenosis 10/11/2020   Cerebral infarction due to unspecified occlusion or stenosis of left middle cerebral artery (HCC) 10/11/2020   Cervical spondylosis without myelopathy 10/11/2020   Chronic kidney disease, stage 1 10/11/2020   Decreased estrogen level 10/11/2020   Hardening of the aorta (main artery of the heart) (HCC) 10/11/2020   Hematuria 10/11/2020   History of carotid endarterectomy 10/11/2020   Impairment of balance 10/11/2020   Insomnia 10/11/2020   Major depression in complete remission (HCC) 10/11/2020   Chronic nephritic syndrome, focal and segmental glomerular lesions 10/11/2020   Non-toxic goiter 10/11/2020   Occlusion and stenosis of  bilateral carotid arteries 10/11/2020   Overactive bladder 10/11/2020   Protein calorie malnutrition (HCC) 10/11/2020   Psoriasis 10/11/2020   Vitamin D deficiency 10/11/2020   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/29/2019   Bilateral knee pain 07/01/2019   Neck nodule 06/22/2019   COPD exacerbation (HCC) 09/02/2015   Asthma exacerbation 09/02/2015   Allergic rhinitis 03/16/2013   Nontoxic multinodular goiter 12/30/2010   ASTHMA 05/03/2009   HYPOKALEMIA 06/13/2008   Thyrotoxicosis  06/17/2007   Essential hypertension 04/30/2007   OSTEOPOROSIS 04/30/2007   PCP:  Mila Palmer, MD Pharmacy:   [facility] [phone] - [place], [place] - [address] AT [address] OF [address] [place] [address] Phone: [phone] Fax: [phone]     Social Determinants of Health (SDOH) Social History: SDOH Screenings   Tobacco Use: Medium Risk (02/06/2023)   SDOH Interventions:     Readmission Risk Interventions     No data to display

## 2023-02-11 ENCOUNTER — Inpatient Hospital Stay (HOSPITAL_COMMUNITY): Payer: Medicare PPO

## 2023-02-11 ENCOUNTER — Encounter (HOSPITAL_COMMUNITY): Payer: Self-pay | Admitting: Orthopedic Surgery

## 2023-02-11 DIAGNOSIS — S79919A Unspecified injury of unspecified hip, initial encounter: Secondary | ICD-10-CM | POA: Diagnosis not present

## 2023-02-11 DIAGNOSIS — Z471 Aftercare following joint replacement surgery: Secondary | ICD-10-CM | POA: Diagnosis not present

## 2023-02-11 DIAGNOSIS — J9601 Acute respiratory failure with hypoxia: Secondary | ICD-10-CM

## 2023-02-11 DIAGNOSIS — M7989 Other specified soft tissue disorders: Secondary | ICD-10-CM | POA: Diagnosis not present

## 2023-02-11 DIAGNOSIS — R339 Retention of urine, unspecified: Secondary | ICD-10-CM | POA: Diagnosis not present

## 2023-02-11 DIAGNOSIS — E0590 Thyrotoxicosis, unspecified without thyrotoxic crisis or storm: Secondary | ICD-10-CM

## 2023-02-11 DIAGNOSIS — R338 Other retention of urine: Secondary | ICD-10-CM | POA: Diagnosis not present

## 2023-02-11 DIAGNOSIS — S72031D Displaced midcervical fracture of right femur, subsequent encounter for closed fracture with routine healing: Secondary | ICD-10-CM | POA: Diagnosis not present

## 2023-02-11 DIAGNOSIS — L89302 Pressure ulcer of unspecified buttock, stage 2: Secondary | ICD-10-CM | POA: Diagnosis not present

## 2023-02-11 DIAGNOSIS — Z96641 Presence of right artificial hip joint: Secondary | ICD-10-CM | POA: Diagnosis not present

## 2023-02-11 DIAGNOSIS — K5900 Constipation, unspecified: Secondary | ICD-10-CM | POA: Diagnosis not present

## 2023-02-11 DIAGNOSIS — R509 Fever, unspecified: Secondary | ICD-10-CM

## 2023-02-11 DIAGNOSIS — Z743 Need for continuous supervision: Secondary | ICD-10-CM | POA: Diagnosis not present

## 2023-02-11 DIAGNOSIS — M25551 Pain in right hip: Secondary | ICD-10-CM | POA: Diagnosis not present

## 2023-02-11 DIAGNOSIS — S72351D Displaced comminuted fracture of shaft of right femur, subsequent encounter for closed fracture with routine healing: Secondary | ICD-10-CM | POA: Diagnosis not present

## 2023-02-11 DIAGNOSIS — I10 Essential (primary) hypertension: Secondary | ICD-10-CM | POA: Diagnosis not present

## 2023-02-11 DIAGNOSIS — Z8781 Personal history of (healed) traumatic fracture: Secondary | ICD-10-CM | POA: Diagnosis not present

## 2023-02-11 DIAGNOSIS — L270 Generalized skin eruption due to drugs and medicaments taken internally: Secondary | ICD-10-CM | POA: Diagnosis not present

## 2023-02-11 DIAGNOSIS — J449 Chronic obstructive pulmonary disease, unspecified: Secondary | ICD-10-CM

## 2023-02-11 DIAGNOSIS — Z79899 Other long term (current) drug therapy: Secondary | ICD-10-CM | POA: Diagnosis not present

## 2023-02-11 DIAGNOSIS — J189 Pneumonia, unspecified organism: Secondary | ICD-10-CM

## 2023-02-11 DIAGNOSIS — J441 Chronic obstructive pulmonary disease with (acute) exacerbation: Secondary | ICD-10-CM | POA: Diagnosis not present

## 2023-02-11 DIAGNOSIS — Z4789 Encounter for other orthopedic aftercare: Secondary | ICD-10-CM | POA: Diagnosis not present

## 2023-02-11 DIAGNOSIS — D72829 Elevated white blood cell count, unspecified: Secondary | ICD-10-CM

## 2023-02-11 DIAGNOSIS — J690 Pneumonitis due to inhalation of food and vomit: Secondary | ICD-10-CM | POA: Diagnosis not present

## 2023-02-11 DIAGNOSIS — Z4889 Encounter for other specified surgical aftercare: Secondary | ICD-10-CM | POA: Diagnosis not present

## 2023-02-11 LAB — BASIC METABOLIC PANEL
Anion gap: 7 (ref 5–15)
BUN: 5 mg/dL — ABNORMAL LOW (ref 8–23)
CO2: 23 mmol/L (ref 22–32)
Calcium: 7.7 mg/dL — ABNORMAL LOW (ref 8.9–10.3)
Chloride: 102 mmol/L (ref 98–111)
Creatinine, Ser: 0.57 mg/dL (ref 0.44–1.00)
GFR, Estimated: >60 mL/min (ref >60)
Glucose, Bld: 101 mg/dL — ABNORMAL HIGH (ref 70–99)
Potassium: 3.5 mmol/L (ref 3.5–5.1)
Sodium: 132 mmol/L — ABNORMAL LOW (ref 135–145)

## 2023-02-11 LAB — MAGNESIUM: Magnesium: 2.1 mg/dL (ref 1.7–2.4)

## 2023-02-11 LAB — CBC
HCT: 27.1 % — ABNORMAL LOW (ref 36.0–46.0)
Hemoglobin: 8.9 g/dL — ABNORMAL LOW (ref 12.0–15.0)
MCH: 32.5 pg (ref 26.0–34.0)
MCHC: 32.8 g/dL (ref 30.0–36.0)
MCV: 98.9 fL (ref 80.0–100.0)
Platelets: 187 10*3/uL (ref 150–400)
RBC: 2.74 MIL/uL — ABNORMAL LOW (ref 3.87–5.11)
RDW: 15.3 % (ref 11.5–15.5)
WBC: 8.2 10*3/uL (ref 4.0–10.5)
nRBC: 0 % (ref 0.0–0.2)

## 2023-02-11 LAB — LEGIONELLA PNEUMOPHILA SEROGP 1 UR AG: L. pneumophila Serogp 1 Ur Ag: NEGATIVE

## 2023-02-11 LAB — CULTURE, BLOOD (ROUTINE X 2): Special Requests: ADEQUATE

## 2023-02-11 MED ORDER — AMOXICILLIN-POT CLAVULANATE 875-125 MG PO TABS: 1.0000 | ORAL_TABLET | Freq: Two times a day (BID) | ORAL | 0 refills | Status: AC

## 2023-02-11 MED ORDER — ACETAMINOPHEN 325 MG PO TABS: 650.0000 mg | ORAL_TABLET | Freq: Four times a day (QID) | ORAL | 0 refills | Status: AC | PRN

## 2023-02-11 MED ORDER — ONDANSETRON HCL 4 MG PO TABS: 4.0000 mg | ORAL_TABLET | Freq: Four times a day (QID) | ORAL | 0 refills | Status: DC | PRN

## 2023-02-11 MED ORDER — SENNA 8.6 MG PO TABS: 1.0000 | ORAL_TABLET | Freq: Two times a day (BID) | ORAL | 0 refills | Status: DC

## 2023-02-11 MED ORDER — AMOXICILLIN-POT CLAVULANATE 875-125 MG PO TABS
1.0000 | ORAL_TABLET | Freq: Two times a day (BID) | ORAL | Status: DC
  Administered 2023-02-11: 1 via ORAL
  Filled 2023-02-11: qty 1

## 2023-02-11 MED ORDER — MUPIROCIN 2 % EX OINT: 1.0000 | TOPICAL_OINTMENT | Freq: Two times a day (BID) | CUTANEOUS | 0 refills | Status: DC

## 2023-02-11 MED ORDER — POLYETHYLENE GLYCOL 3350 17 G PO PACK: 17.0000 g | PACK | Freq: Two times a day (BID) | ORAL | 0 refills | Status: DC

## 2023-02-11 NOTE — TOC Transition Note (Signed)
Transition of Care [facility]) - CM/SW Discharge Note   Patient Details  Name: Ariel Lambert MRN: [id number] Date of Birth: 1936-01-07  Transition of Care [facility]) CM/SW Contact:  Lorri Frederick, LCSW Phone Number: 02/11/2023, 2:09 PM   Clinical Narrative:   Pt discharging to [facility].  RN call report to [phone].      Final next level of care: Skilled Nursing Facility Barriers to Discharge: Barriers Resolved   Patient Goals and CMS Choice CMS Medicare.gov Compare Post Acute Care list provided to:: Patient Represenative (must comment) (daughter [name]) Choice offered to / list presented to : Adult Children  Discharge Placement                Patient chooses bed at: [facility], [place] Patient to be transferred to facility by: [facility] Name of family member notified: daughter in room Patient and family notified of of transfer: 02/11/23  Discharge Plan and Services Additional resources added to the After Visit Summary for   In-house Referral: Clinical Social Work   Post Acute Care Choice: Skilled Nursing Facility                               Social Determinants of Health (SDOH) Interventions SDOH Screenings   Tobacco Use: Medium Risk (02/06/2023)     Readmission Risk Interventions     No data to display

## 2023-02-11 NOTE — Discharge Summary (Signed)
Physician Discharge Summary   Patient: Ariel Lambert MRN: [id number] DOB: 1936-01-11  Admit date:     02/05/2023  Discharge date: 02/11/23  Discharge Physician: Marguerita Merles, DO   PCP: Mila Palmer, MD   Recommendations at discharge:   Follow-up with PCP within 1 to 2 weeks and repeat CBC, CMP, Mag, Phos within 1 week Follow-up with Orthopedic Surgery in outpatient setting within 2 weeks for repeat x-rays and wound check Will need an outpatient Urology evaluation for trial of void in about 5 to 7 days once patient is more mobile Repeat chest x-ray in 3 to 6 weeks to ensure improvement and left lower lobe pneumonia  Discharge Diagnoses: Principal Problem:   S/P right hip fracture Active Problems:   Leukocytosis   Acute hypoxic respiratory failure (HCC)   Hyperthyroidism   Hypertension   Chronic obstructive pulmonary disease (HCC)   Fever   Constipation   PNA (pneumonia)   Acute urinary retention  Resolved Problems:   * No resolved hospital problems. *  Hospital Course: Is an 87 year old Caucasian female with a past medical history significant for vomiting hypertension, hyperlipidemia, osteoporosis, hypothyroidism, carotid artery stenosis status post carotid endarterectomy, asthma as well as other comorbidities who presented to the ED after a mechanical fall.  Plain films of the right hip and pelvis revealed a mildly displaced subcapital femoral fracture on the right and orthopedic surgery was consulted and took her for surgical repair on 02/06/2023.  Hospitalization has been complicated by acute respiratory failure with hypoxia and fevers in the setting of acute aspiration on left along with postoperative acute blood loss anemia.  She has since been weaned off of supplemental oxygen and is improved.  She is medically stable for discharge and can be followed up in the outpatient setting with PCP and orthopedic surgery within 1 to 2 weeks  Assessment and Plan:  Right Hip  Fracture -Second due to mechanical fall -Patient seen in consultation by orthopedics and patient status post right hip hemiarthroplasty 02/06/2023.  -Continued IV fluids, IV antiemetics. -Continue scheduled Tylenol 1000 mg 3 times daily, IV Dilaudid as needed, Robaxin as needed. -Patient with significant pain yesterday morning, as needed pain medications have timed out and as such we will order a one-time dose of oxycodone 5 mg p.o. x 1 while awaiting pain regimen from orthopedics. -Supportive care. -Postop DVT prophylaxis and pain management per orthopedics. -She is medically stable now and can be discharged to SNF PT OT recommendations   Acute hypoxic respiratory failure in the setting of opioid use -Patient noted not to be on oxygen supplementation at baseline. -Patient to have received fentanyl in the ED leading to hypoxia and patient maintained on nasal cannula. -Chest x-ray ordered initially, negative for any acute infiltrate or pulmonary edema. SpO2: 95 % O2 Flow Rate (L/min): 2.5 L/min; Now off of Supplemental O2 via -Repeat chest x-ray ordered 02/07/2023 due to high fevers concerning for pneumonia. -CT Scan of the Chest done and is as below -Continue pulmonary toileting with incentive spirometry. -Flutter valve. -Continue bronchodilators. -IV antibiotics and will change to oral Abx for D/C -Need to monitor respiratory status carefully and follow-up in the outpatient setting with repeat chest x-ray in 3 to 6 weeks   Leukocytosis, improved  -Likely reactive leukocytosis secondary to problem #1 versus secondary to aspiration pneumonia. -Patient with fevers and now afebrile x 24 hours. -Initial chest x-ray obtained on admission with no acute infiltrate.   -Repeat chest x-ray obtained 02/07/2023 with concerns for  infiltrate. -WBC Trend: Recent Labs  Lab 02/06/23 0351 02/07/23 0207 02/07/23 1448 02/08/23 0139 02/09/23 0227 02/10/23 0146 02/11/23 0407  WBC 8.4 9.0 11.8* 9.4 7.7  8.1 8.2  -Urinalysis nitrite negative, leukocytes negative. -Leukocytosis trended down and currently resolved. -CT chest concerning for left basilar infiltrate and small bilateral effusions as below - CT right hip done status post right hip arthroplasty with intact hardware, soft tissue edema multiple foci of gas in the lateral aspect of the femoral component in the gluteal region as well as about the distal aspect of the femoral component may represent postsurgical changes in this patient with recent hip replacement.  Marked subcutaneous soft tissue edema about the lateral aspect of the hip joint/gluteal region.  Fluid collection about the site of prior surgical incision measuring up to 5.8 x 3.5 cm. -Continue empiric IV Unasyn due to concerns for aspiration pneumonia and will transition to oral Augmentin at discharge   Hyperthyroidism -Continue methimazole 7.5 mg p.o. every other day.     COPD -Stable. -Continue Dulera, Incruse Ellipta, Claritin and Flonase -Xopenex nebs as needed.   SpO2: 95 % O2 Flow Rate (L/min): 2.5 L/min; has been weaned to room air and no longer requiring supplemental oxygen -Continue to monitor respiratory status carefully and repeat chest x-ray in 3 to 6 weeks given the pneumonia  Right Leg Swelling -In the setting of surgical intervention -Obtained a lower extremity venous duplex on the right and showed "RIGHT:  - There is no evidence of deep vein thrombosis in the lower extremity. However, portions of this examination were limited- see technologist comments above. A cystic structure is found in the popliteal fossa.   LEFT:  No evidence of common femoral vein obstruction.*See table(s) above for measurements and observations."   Hypertension -Patient was started on decreased dose of Avapro due to BP soft/borderline as such Avapro discontinued.  -Blood pressure improved after transfusion of PRBCs and IV fluids.   -Continue to monitor blood pressure per  protocol -Last blood pressure reading was 116/64 -Have outpatient physician or PCP resume valsartan-hydrochlorothiazide 320-12.5 mg p.o. daily we will hold at discharge at this time   Dehydration -Status post IV fluids and improved  Acute Urinary Retention -She failed trial of void on 527 and had to have Foley catheter placed due to retention -Outpatient follow-up void once more mobile and will go with Foley and recommend outpatient follow-up with urology in 5 to 7 days for trial of void  Constipation -Continue bowel regimen of MiraLAX twice daily, Senokot as twice daily.  -Continue with bisacodyl 10 mg RC  Hyponatremia -Mild. Na+ Trend: Recent Labs  Lab 02/05/23 0205 02/06/23 0351 02/07/23 0207 02/08/23 0139 02/09/23 0227 02/10/23 0146 02/11/23 0407  NA 136 134* 130* 127* 134* 130* 132*  -Continue to Monitor and Trend and repeat CMP within 1 week  Hyperlipidemia -Continue with Ezetimibe 10 mg p.o. daily and Simvastatin 20 mg p.o. daily   Fever/probable aspiration pneumonia -Patient noted to spike a temp of 103 the morning of 02/07/2023 and the evening of 02/07/2023.   -Patient with temp of 102.4 the evening of 02/08/2023 despite being on empiric IV antibiotics of Unasyn. -Chest x-ray done concerning for probable pneumonia.   -Initial Urinalysis nitrite negative leukocytes negative but repeat UA done and showed 20 Ketones and Specific Gravity of 1.004 and urine culture was done and showed 40,000 colony-forming units of Enterococcus which was pansensitive.   -Blood cultures pending with no growth to date x 4 days.  -  Urine strep pneumococcus antigen negative.  Urine Legionella antigen pending.  -CT Chest done and showed "Small left and trace right pleural effusions. Left greater than right lower lobe opacities, favoring atelectasis on the right, atelectasis versus pneumonia on the left. No endobronchial debris suggest aspiration. Aortic atherosclerosis.  Coronary artery  calcifications." -CT right hip done status post right hip arthroplasty with intact hardware, soft tissue edema multiple foci of gas in the lateral aspect of the femoral component in the gluteal region as well as about the distal aspect of the femoral component may represent postsurgical changes in this patient with recent hip replacement.  Marked subcutaneous soft tissue edema about the lateral aspect of the hip joint/gluteal region.  Fluid collection about the site of prior surgical incision measuring up to 5.8 x 3.5 cm. -Patient afebrile x 48 hours now. -Continue empiric IV Unasyn while hospitalized and transition to oral Augmentin for discharge for a total of 7 more days, -Continue Pulmonary toileting, supportive care. -Orthopedics to weigh in on CT right hip findings feel that the CT shows normal postoperative findings and that infection is possible to soon after surgery and they feel that the cause of her leukocytosis was aspiration pneumonia -Supportive care.   Postop acute blood loss anemia -Hematoma noted on op report that was evacuated. -Patient with no overt bleeding. -Anemia panel with iron level of 9, UBIC of 166, TIBC of 175, ferritin of 141, folate of 29.9 and Vitamin B12 Level of 228 -Hgb/Hct Trend:  Recent Labs  Lab 02/07/23 0207 02/07/23 1448 02/08/23 0139 02/08/23 1338 02/09/23 0227 02/10/23 0146 02/11/23 0407  HGB 8.9* 8.8* 8.1* 8.1* 7.0* 9.1* 8.9*  HCT 28.2* 26.7* 23.9* 24.9* 21.2* 26.9* 27.1*  MCV 103.7* 100.0 98.4  --  103.4* 93.7 98.9  -Hemoglobin at 7.0 (02/09/2023) from 12.7 on admission.  -Status post transfusion 2 units PRBCs with hemoglobin currently at 9.1. -Continue to Monitor and Trend and repeat CBC in the AM   Thrombocytopenia -Platelet Count Trend: Recent Labs  Lab 02/06/23 0351 02/07/23 0207 02/07/23 1448 02/08/23 0139 02/09/23 0227 02/10/23 0146 02/11/23 0407  PLT 162 122* 128* 139* 124* 159 187  -Continue to Monitor for S/Sx of Bleeding; no  overt bleeding noted -Repeat CBC within 1 week   SVT -Was informed by RN that monitoring stated patient had a 14 beat run of SVT (02/09/2023) remained asymptomatic heart rates as high as 140s but returned back to normal sinus rhythm. -Patient with hemoglobin down to 7 being status post transfusion 2 units PRBCs 02/09/2023 with repeat hemoglobin currently at 9.1.   -Magnesium repleted currently at 2.7, potassium of 4.0.   -Follow.     Hypoalbuminemia -Patient's Albumin Trend: Recent Labs  Lab 02/08/23 1338  ALBUMIN 2.0*  -Continue to Monitor and Trend and repeat CMP in the AM  Pain control - [facility] Controlled Substance Reporting System database was reviewed. and patient was instructed, not to drive, operate heavy machinery, perform activities at heights, swimming or participation in water activities or provide baby-sitting services while on Pain, Sleep and Anxiety Medications; until their outpatient Physician has advised to do so again. Also recommended to not to take more than prescribed Pain, Sleep and Anxiety Medications.   Consultants: Orthopedic Surgery Procedures performed: LE Venous Duplex  Disposition: Skilled nursing facility Diet recommendation:  Cardiac diet DISCHARGE MEDICATION: Allergies as of 02/11/2023       Reactions   Cetirizine Other (See Comments)   Stomach pain   Codeine Itching  Severe itching   Meperidine Hcl Diarrhea, Nausea And Vomiting   (Demerol)   Nabumetone Rash   (Relafen)   Naproxen Itching, Rash   Sulfa Antibiotics Rash   Sulfonamide Derivatives Rash        Medication List     STOP taking these medications    aspirin EC 81 MG tablet Replaced by: aspirin 81 MG chewable tablet   valsartan-hydrochlorothiazide 320-12.5 MG tablet Commonly known as: DIOVAN-HCT       TAKE these medications    acetaminophen 325 MG tablet Commonly known as: TYLENOL Take 2 tablets (650 mg total) by mouth every 6 (six) hours as needed for fever,  headache or mild pain.   albuterol 108 (90 Base) MCG/ACT inhaler Commonly known as: VENTOLIN HFA Inhale 2 puffs into the lungs every 6 (six) hours as needed. What changed: reasons to take this   alendronate 70 MG tablet Commonly known as: FOSAMAX Take 70 mg by mouth every Friday. Take with a full glass of water on an empty stomach.   amoxicillin-clavulanate 875-125 MG tablet Commonly known as: AUGMENTIN Take 1 tablet by mouth every 12 (twelve) hours for 7 days.   aspirin 81 MG chewable tablet Commonly known as: Aspirin Childrens Chew 1 tablet (81 mg total) by mouth 2 (two) times daily with a meal. Replaces: aspirin EC 81 MG tablet   azelastine 0.1 % nasal spray Commonly known as: ASTELIN Place 1 spray into both nostrils every evening. Use in each nostril as directed   BIOTIN PO Take 1 tablet by mouth daily.   desloratadine 5 MG tablet Commonly known as: CLARINEX Take 5 mg by mouth every evening.   diphenhydrAMINE 25 MG tablet Commonly known as: BENADRYL 25 mg daily as needed for allergies.   docusate sodium 100 MG capsule Commonly known as: COLACE Take 100 mg by mouth daily.   ezetimibe-simvastatin 10-20 MG tablet Commonly known as: VYTORIN Take 1 tablet by mouth daily after breakfast.   fluticasone 50 MCG/ACT nasal spray Commonly known as: FLONASE Place 2 sprays into both nostrils in the morning and at bedtime.   HYDROcodone-acetaminophen 5-325 MG tablet Commonly known as: NORCO/VICODIN Take 1 tablet by mouth every 4 (four) hours as needed for up to 7 days for moderate pain or severe pain.   levalbuterol 0.63 MG/3ML nebulizer solution Commonly known as: XOPENEX Take 3 mLs (0.63 mg total) by nebulization every 4 (four) hours. What changed:  when to take this reasons to take this   magnesium gluconate 500 MG tablet Commonly known as: MAGONATE Take 500 mg by mouth daily.   methimazole 5 MG tablet Commonly known as: TAPAZOLE Take 7.5 mg by mouth every  other day.   montelukast 10 MG tablet Commonly known as: SINGULAIR Take 10 mg by mouth at bedtime.   multivitamin with minerals Tabs tablet Take 1 tablet by mouth daily.   mupirocin ointment 2 % Commonly known as: BACTROBAN Place 1 Application into the nose 2 (two) times daily.   ondansetron 4 MG tablet Commonly known as: ZOFRAN Take 1 tablet (4 mg total) by mouth every 6 (six) hours as needed for nausea.   oyster calcium 500 MG Tabs tablet Take 500 mg of elemental calcium by mouth 2 (two) times daily.   polyethylene glycol 17 g packet Commonly known as: MIRALAX / GLYCOLAX Take 17 g by mouth 2 (two) times daily.   senna 8.6 MG Tabs tablet Commonly known as: SENOKOT Take 1 tablet (8.6 mg total) by mouth 2 (two)  times daily.   Symbicort 160-4.5 MCG/ACT inhaler Generic drug: budesonide-formoterol Inhale 2 puffs into the lungs 2 (two) times daily.   traZODone 50 MG tablet Commonly known as: DESYREL Take 50 mg by mouth at bedtime.   zinc gluconate 50 MG tablet Take 50 mg by mouth daily.        Contact information for follow-up providers     [place], Alain Honey, PA-C. Schedule an appointment as soon as possible for a visit in 2 week(s).   Specialty: Orthopedic Surgery Why: For suture removal, For wound re-check Contact information: [address] [place] [address] [phone]              Contact information for after-discharge care     Destination     [facility] [place] Preferred SNF .   Service: Skilled Nursing Contact information: [address] [place] [address] [phone]                    Discharge Exam: Ceasar Mons Weights   02/05/23 0332 02/10/23 0456  Weight: 59.3 kg 60.1 kg   Vitals:   02/11/23 0734 02/11/23 0818  BP: 116/64   Pulse: 78 85  Resp: 16 16  Temp: 98.9 F (37.2 C)   SpO2: 91% 95%   Examination: Physical Exam:  Constitutional: WN/WD elderly Caucasian female in  no acute distress appears calm Respiratory: Diminished to auscultation bilaterally, no wheezing, rales, rhonchi or crackles. Normal respiratory effort and patient is not tachypenic. No accessory muscle use.  Unlabored breathing Cardiovascular: RRR, no murmurs / rubs / gallops. S1 and S2 auscultated.  Has 1+ lower extremity edema on the right compared to left Abdomen: Soft, non-tender, non-distended.  Bowel sounds positive.  GU: Deferred.  Foley catheter is in place Musculoskeletal: No clubbing / cyanosis of digits/nails. No joint deformity upper and lower extremities.  Skin: No rashes, lesions, ulcers on a limited skin evaluation. No induration; Warm and dry.  Neurologic: CN 2-12 grossly intact with no focal deficits.  Romberg sign and cerebellar reflexes not assessed.  Psychiatric: Normal judgment and insight. Alert and oriented x 3. Normal mood and appropriate affect.   Condition at discharge: stable  The results of significant diagnostics from this hospitalization (including imaging, microbiology, ancillary and laboratory) are listed below for reference.   Imaging Studies: VAS [place] LOWER EXTREMITY VENOUS (DVT)  Result Date: 02/11/2023  Lower Venous DVT Study Patient Name:  PADMA HERKERT  Date of Exam:   02/11/2023 Medical Rec #: [id number]                Accession #:    [id number] Date of Birth: 07/19/1936                Patient Gender: F Patient Age:   53 years Exam Location:  [facility] Procedure:      VAS [place] LOWER EXTREMITY VENOUS (DVT) Referring Phys: Marguerita Merles --------------------------------------------------------------------------------  Indications: Swelling.  Risk Factors: None identified. Limitations: Patient positioning, patient immobility. Comparison Study: No prior studies. Performing Technologist: Chanda Busing RVT  Examination Guidelines: A complete evaluation includes B-mode imaging, spectral Doppler, color Doppler, and power Doppler as needed of all accessible  portions of each vessel. Bilateral testing is considered an integral part of a complete examination. Limited examinations for reoccurring indications may be performed as noted. The reflux portion of the exam is performed with the patient in reverse Trendelenburg.  +---------+---------------+---------+-----------+----------+-------------------+ RIGHT    CompressibilityPhasicitySpontaneityPropertiesThrombus Aging      +---------+---------------+---------+-----------+----------+-------------------+  CFV      Full           Yes      Yes                                      +---------+---------------+---------+-----------+----------+-------------------+ SFJ      Full                                                             +---------+---------------+---------+-----------+----------+-------------------+ FV Prox  Full                                                             +---------+---------------+---------+-----------+----------+-------------------+ FV Mid   Full                                                             +---------+---------------+---------+-----------+----------+-------------------+ FV Distal               Yes      Yes                                      +---------+---------------+---------+-----------+----------+-------------------+ PFV      Full                                                             +---------+---------------+---------+-----------+----------+-------------------+ POP      Full           Yes      Yes                                      +---------+---------------+---------+-----------+----------+-------------------+ PTV      Full                                                             +---------+---------------+---------+-----------+----------+-------------------+ PERO                                                  Not well visualized  +---------+---------------+---------+-----------+----------+-------------------+   +----+---------------+---------+-----------+----------+--------------+ LEFTCompressibilityPhasicitySpontaneityPropertiesThrombus Aging +----+---------------+---------+-----------+----------+--------------+ CFV Full           Yes      Yes                                 +----+---------------+---------+-----------+----------+--------------+  Summary: RIGHT: - There is no evidence of deep vein thrombosis in the lower extremity. However, portions of this examination were limited- see technologist comments above.  - A cystic structure is found in the popliteal fossa.  LEFT: - No evidence of common femoral vein obstruction.  *See table(s) above for measurements and observations.    Preliminary    CT HIP RIGHT W CONTRAST  Result Date: 02/09/2023 CLINICAL DATA:  Hip replacement. Infection suspected. Recent hip surgery. Complication suspected. Fever. EXAM: CT OF THE LOWER RIGHT EXTREMITY WITH CONTRAST TECHNIQUE: Multidetector CT imaging of the lower right extremity was performed according to the standard protocol following intravenous contrast administration. RADIATION DOSE REDUCTION: This exam was performed according to the departmental dose-optimization program which includes automated exposure control, adjustment of the mA and/or kV according to patient size and/or use of iterative reconstruction technique. CONTRAST:  75mL OMNIPAQUE IOHEXOL 350 MG/ML SOLN COMPARISON:  Radiographs dated Feb 06, 2023 FINDINGS: Bones/Joint/Cartilage Status post right hip arthroplasty. The hardware is intact. There is soft tissue edema and multiple foci of gas in the lateral aspect of the femoral component in the gluteal region as well as about the distal aspect of the femoral component. These may represent postsurgical changes in this patient with recent hip replacement. Ligaments Suboptimally assessed by CT. Muscles and Tendons  Generalized muscle atrophy of the right hip and thigh muscles. Iliopsoas tendon appears intact. Soft tissues Marked subcutaneous soft tissue edema about the lateral aspect of the hip joint/gluteal region. Fluid collection about the site of prior surgical incision measuring up to 5.8 x 3.5 cm. IMPRESSION: 1. Status post right hip arthroplasty. The hardware is intact. There is soft tissue edema and multiple foci of gas in the lateral aspect of the femoral component in the gluteal region as well as about the distal aspect of the femoral component. These may represent postsurgical changes in this patient with recent hip replacement, however infectious/inflammatory process can not be excluded. Clinical correlation is suggested. 2. Marked subcutaneous soft tissue edema about the lateral aspect of the hip joint/gluteal region. Fluid collection about the site of prior surgical incision measuring up to 5.8 x 3.5 cm. Electronically Signed   By: Larose Hires D.O.   On: 02/09/2023 22:36   CT CHEST W CONTRAST  Result Date: 02/09/2023 CLINICAL DATA:  Pneumonia, complication suspected, xray done fever EXAM: CT CHEST WITH CONTRAST TECHNIQUE: Multidetector CT imaging of the chest was performed during intravenous contrast administration. RADIATION DOSE REDUCTION: This exam was performed according to the departmental dose-optimization program which includes automated exposure control, adjustment of the mA and/or kV according to patient size and/or use of iterative reconstruction technique. CONTRAST:  75mL OMNIPAQUE IOHEXOL 350 MG/ML SOLN COMPARISON:  Radiograph 02/07/2023 FINDINGS: Cardiovascular: Heart size upper normal. There are coronary artery calcifications. Thoracic aorta is tortuous. There is calcified noncalcified atheromatous plaque throughout the thoracic aorta. No central pulmonary embolus on this exam not tailored to pulmonary artery assessment. No pericardial effusion. Mediastinum/Nodes: No mediastinal or hilar  adenopathy. No esophageal wall thickening. Lungs/Pleura: Small left and trace right pleural effusion. Left greater than right lower lobe opacities, favoring atelectasis on the right, atelectasis versus pneumonia on the left. No endobronchial debris suggest aspiration. No features of pulmonary edema. Upper Abdomen: Excreted IV contrast in the left renal collecting system. Musculoskeletal: Mild scoliosis and degenerative change in the spine. Early Modic endplate changes are noted at T8-T9. There is chronic bilateral shoulder arthropathy. There are no acute or suspicious osseous abnormalities. IMPRESSION: 1.  Small left and trace right pleural effusions. Left greater than right lower lobe opacities, favoring atelectasis on the right, atelectasis versus pneumonia on the left. No endobronchial debris suggest aspiration. 2. Aortic atherosclerosis.  Coronary artery calcifications. Aortic Atherosclerosis (ICD10-I70.0). Electronically Signed   By: Narda Rutherford M.D.   On: 02/09/2023 20:38   DG CHEST PORT 1 VIEW  Result Date: 02/07/2023 CLINICAL DATA:  Fever. EXAM: PORTABLE CHEST 1 VIEW COMPARISON:  02/05/2023 FINDINGS: There is a new retrocardiac opacity and left pleural effusion. Mild streaky right infrahilar opacity. The patient is rotated. Grossly stable heart size and mediastinal contours allowing for differences in technique. No pulmonary edema or pneumothorax. IMPRESSION: New retrocardiac opacity and left pleural effusion, suspicious for pneumonia. Streaky right infrahilar opacity may be atelectasis or pneumonia. Aspiration could have this appearance in the appropriate clinical setting. Electronically Signed   By: Narda Rutherford M.D.   On: 02/07/2023 15:26   DG Pelvis Portable  Result Date: 02/06/2023 CLINICAL DATA:  Postop. EXAM: PORTABLE PELVIS 1-2 VIEWS COMPARISON:  Preoperative radiograph 04/07/2023 FINDINGS: Right hip arthroplasty with cerclage wire fixation in expected alignment. No periprosthetic  lucency or fracture. Recent postsurgical change includes air and edema in the soft tissues. Lateral skin staples in place. IMPRESSION: Right hip arthroplasty without immediate postoperative complication. Electronically Signed   By: Narda Rutherford M.D.   On: 02/06/2023 15:59   DG HIP UNILAT WITH PELVIS 1V RIGHT  Result Date: 02/06/2023 CLINICAL DATA:  [id number] Elective surgery [id number] EXAM: DG HIP (WITH OR WITHOUT PELVIS) 1V RIGHT COMPARISON:  Hip radiograph 02/05/2023 FINDINGS: Intraoperative fluoroscopic images during right hip arthroplasty with proximal cable fixation. Normal alignment. Expected soft tissue changes. Fluoroscopy time 4.2 seconds. Dose: 0.3458 mGy. IMPRESSION: Intraoperative fluoroscopic images during right hip arthroplasty and proximal cable fixation. Normal alignment. Fluoroscopy time/dose as above. Electronically Signed   By: Caprice Renshaw M.D.   On: 02/06/2023 15:31   DG C-Arm 1-60 Min-No Report  Result Date: 02/06/2023 Fluoroscopy was utilized by the requesting physician.  No radiographic interpretation.   DG C-Arm 1-60 Min-No Report  Result Date: 02/06/2023 Fluoroscopy was utilized by the requesting physician.  No radiographic interpretation.   DG C-Arm 1-60 Min-No Report  Result Date: 02/06/2023 Fluoroscopy was utilized by the requesting physician.  No radiographic interpretation.   DG Knee Right Port  Result Date: 02/05/2023 CLINICAL DATA:  [id number] Closed displaced fracture of right femoral neck (HCC) [id number] EXAM: PORTABLE RIGHT KNEE - 1-2 VIEW COMPARISON:  None Available. FINDINGS: No fracture or dislocation. No effusion. Marked narrowing of articular cartilage in the lateral compartment with near bone on bone apposition and marginal spurring. Small medial spurs are also noted. Patchy arterial calcifications. Soft tissues otherwise unremarkable. IMPRESSION: 1. No acute findings. 2. Advanced lateral compartment DJD. Electronically Signed   By: Corlis Leak M.D.   On:  02/05/2023 10:38   DG CHEST PORT 1 VIEW  Result Date: 02/05/2023 CLINICAL DATA:  [id number] Hypoxia [id number] EXAM: PORTABLE CHEST 1 VIEW COMPARISON:  CXR 10/31/21 FINDINGS: No pleural effusion. No pneumothorax. No focal airspace opacity. Normal cardiac and mediastinal contours. No radiographically apparent displaced rib fractures. Visualized upper abdomen is unremarkable. IMPRESSION: No focal airspace opacity. Electronically Signed   By: Lorenza Cambridge M.D.   On: 02/05/2023 09:05   DG Hip Unilat  With Pelvis 2-3 Views Right  Result Date: 02/05/2023 CLINICAL DATA:  Hip pain. EXAM: DG HIP (WITH OR WITHOUT PELVIS) 2-3V RIGHT COMPARISON:  None Available. FINDINGS: There is a subcapital fracture  of the proximal right femur with mild superior subluxation of the distal fracture fragment. No dislocation. Moderate degenerative changes are noted at the right hip and mild degenerative changes are present at the left hip. Vascular calcifications are noted in the lower extremities bilaterally. Degenerative changes are noted in the lower lumbar spine. IMPRESSION: Mildly displaced subcapital femoral fracture on the right. Electronically Signed   By: Thornell Sartorius M.D.   On: 02/05/2023 02:30    Microbiology: Results for orders placed or performed during the hospital encounter of 02/05/23  Surgical pcr screen     Status: Abnormal   Collection Time: 02/05/23  6:38 PM   Specimen: Nasal Mucosa; Nasal Swab  Result Value Ref Range Status   MRSA, PCR NEGATIVE NEGATIVE Final   Staphylococcus aureus POSITIVE (A) NEGATIVE Final    Comment: (NOTE) The Xpert SA Assay (FDA approved for NASAL specimens in patients 45 years of age and older), is one component of a comprehensive surveillance program. It is not intended to diagnose infection nor to guide or monitor treatment. Performed at [facility], [address], [place], [place] [address]   Culture, blood (Routine X 2) w Reflex to ID Panel     Status: None (Preliminary  result)   Collection Time: 02/07/23 10:20 AM   Specimen: BLOOD RIGHT HAND  Result Value Ref Range Status   Specimen Description BLOOD RIGHT HAND  Final   Special Requests   Final    BOTTLES DRAWN AEROBIC AND ANAEROBIC Blood Culture adequate volume   Culture   Final    NO GROWTH 4 DAYS Performed at [facility], [address], [place], [place] [address]    Report Status PENDING  Incomplete  Culture, blood (Routine X 2) w Reflex to ID Panel     Status: None (Preliminary result)   Collection Time: 02/07/23 10:24 AM   Specimen: BLOOD LEFT HAND  Result Value Ref Range Status   Specimen Description BLOOD LEFT HAND  Final   Special Requests   Final    BOTTLES DRAWN AEROBIC AND ANAEROBIC Blood Culture adequate volume   Culture   Final    NO GROWTH 4 DAYS Performed at [facility], [address], [place], [place] [address]    Report Status PENDING  Incomplete  Urine Culture (for pregnant, neutropenic or urologic patients or patients with an indwelling urinary catheter)     Status: Abnormal   Collection Time: 02/07/23 12:17 PM   Specimen: Urine, Catheterized  Result Value Ref Range Status   Specimen Description URINE, CATHETERIZED  Final   Special Requests   Final    NONE Performed at [facility], [address], [place], [place] [address]    Culture 40,000 COLONIES/mL ENTEROCOCCUS FAECALIS (A)  Final   Report Status 02/09/2023 FINAL  Final   Organism ID, Bacteria ENTEROCOCCUS FAECALIS (A)  Final      Susceptibility   Enterococcus faecalis - MIC*    AMPICILLIN <=2 SENSITIVE Sensitive     NITROFURANTOIN <=16 SENSITIVE Sensitive     VANCOMYCIN 1 SENSITIVE Sensitive     * 40,000 COLONIES/mL ENTEROCOCCUS FAECALIS   Labs: CBC: Recent Labs  Lab 02/07/23 0207 02/07/23 1448 02/08/23 0139 02/08/23 1338 02/09/23 0227 02/10/23 0146 02/11/23 0407  WBC 9.0 11.8* 9.4  --  7.7 8.1 8.2  NEUTROABS 7.7 10.1*  --   --  6.3 6.8  --   HGB 8.9* 8.8* 8.1* 8.1* 7.0* 9.1*  8.9*  HCT 28.2* 26.7* 23.9* 24.9* 21.2* 26.9* 27.1*  MCV 103.7* 100.0 98.4  --  103.4* 93.7 98.9  PLT 122* 128* 139*  --  124* 159 187   Basic Metabolic Panel: Recent Labs  Lab 02/07/23 0207 02/08/23 0139 02/09/23 0227 02/10/23 0146 02/11/23 0407  NA 130* 127* 134* 130* 132*  K 3.5 3.3* 3.7 4.0 3.5  CL 100 98 106 102 102  CO2 20* 22 22 21* 23  GLUCOSE 99 176* 139* 101* 101*  BUN 8 12 8  6* 5*  CREATININE 0.62 0.64 0.57 0.48 0.57  CALCIUM 7.4* 7.2* 6.9* 7.1* 7.7*  MG  --  1.8 1.7 2.7* 2.1   Liver Function Tests: Recent Labs  Lab 02/08/23 1338  ALBUMIN 2.0*   CBG: No results for input(s): "GLUCAP" in the last 168 hours.  Discharge time spent: greater than 30 minutes.  Signed: Marguerita Merles, DO [facility] 02/11/2023

## 2023-02-11 NOTE — Progress Notes (Signed)
Ariel Lambert to be D/C'd Rehab per MD order.  Discussed with the patient and all questions fully answered.  VSS, Skin clean, dry and intact without evidence of skin break down, no evidence of skin tears noted. IV catheter discontinued intact. Site without signs and symptoms of complications. Dressing and pressure applied.  D/c education completed with patient/family including follow up instructions, medication list, d/c activities limitations if indicated, with other d/c instructions as indicated by MD - patient able to verbalize understanding, all questions fully answered.   Report called to [facility], receiving nurse at [facility] [facility].  Patient instructed to return to ED, call 911, or call MD for any changes in condition.   Patient escorted via stretcher, and D/C to [facility] via non emergency ambulance.  [name] 02/11/2023 6:12 PM

## 2023-02-11 NOTE — Progress Notes (Signed)
Physical Therapy Treatment Patient Details Name: Ariel Lambert MRN: [id number] DOB: 08/11/1936 Today's Date: 02/11/2023   History of Present Illness Patient is 87 y.o. female presented after fall with Rt femoral neck fracture now s/p Rt hemiarthroplasty anterior approach on 02/06/23. PMH significant for asthma, OA, CKD, CVA, COPD, HTN, hyperthyroidism, osteoporosis, Lt TKA in 2023.    PT Comments    Pt was seen for mobility prior to getting up to chair, and worked to assist her to transfer to side of bed, stand with Pablo Pena and transfer to chair.  Pt is up with ice packs on R hip, some pain with immediate WB effort but is better once set in chair.  Family in to see her initiate movement and to discuss her needs for rehab.  Pt is progressing to be closer to one person assist, and will work on walker when ready.  Follow along with her for goals on POC for acute PT.   Recommendations for follow up therapy are one component of a multi-disciplinary discharge planning process, led by the attending physician.  Recommendations may be updated based on patient status, additional functional criteria and insurance authorization.  Follow Up Recommendations       Assistance Recommended at Discharge Frequent or constant Supervision/Assistance  Patient can return home with the following Two people to help with walking and/or transfers;Two people to help with bathing/dressing/bathroom;Assistance with cooking/housework;Direct supervision/assist for medications management;Assist for transportation;Help with stairs or ramp for entrance   Equipment Recommendations  Other (comment) (TBD in rehab)    Recommendations for Other Services OT consult;Rehab consult     Precautions / Restrictions Precautions Precautions: Fall Precaution Comments: monitor sats, O2 is off Restrictions Weight Bearing Restrictions: No RLE Weight Bearing: Weight bearing as tolerated     Mobility  Bed Mobility Overal bed  mobility: Needs Assistance Bed Mobility: Supine to Sit Rolling: Mod assist   Supine to sit: Mod assist     General bed mobility comments: mod assist with bed pad for managing discomfort    Transfers Overall transfer level: Needs assistance Equipment used: Ambulation equipment used Transfers: Sit to/from Stand, Bed to chair/wheelchair/BSC Sit to Stand: Max assist           General transfer comment: max of one to stand and used stedy to transition to chair Transfer via Lift Equipment: Stedy  Ambulation/Gait               General Gait Details: unable   Optometrist    Modified Rankin (Stroke Patients Only)       Balance Overall balance assessment: Needs assistance Sitting-balance support: Feet supported, Bilateral upper extremity supported Sitting balance-Leahy Scale: Fair     Standing balance support: Bilateral upper extremity supported, During functional activity, Reliant on assistive device for balance Standing balance-Leahy Scale: Poor                              Cognition Arousal/Alertness: Awake/alert Behavior During Therapy: WFL for tasks assessed/performed Overall Cognitive Status: Within Functional Limits for tasks assessed                                 General Comments: Pt has dx of PNA and noted sats were no lower than 89% briefly and stable with 94% upon moving and sitting up  in chair        Exercises      General Comments General comments (skin integrity, edema, etc.): Pt was assisted to stand with care of positioning R hip to stand, tolerated being supported up but having some pain of the effort.  Pain to lower to chair as well then more comfortable with ice applied      Pertinent Vitals/Pain Pain Assessment Pain Assessment: Faces Faces Pain Scale: Hurts even more Pain Location: Rt hip/leg upon standing Pain Descriptors / Indicators: Grimacing, Guarding, Discomfort,  Moaning Pain Intervention(s): Limited activity within patient's tolerance, Monitored during session, Premedicated before session, Repositioned, Ice applied    Home Living                          Prior Function            PT Goals (current goals can now be found in the care plan section) Acute Rehab PT Goals Patient Stated Goal: get to rehab and home Progress towards PT goals: Progressing toward goals    Frequency    Min 3X/week      PT Plan Current plan remains appropriate    Co-evaluation              AM-PAC PT "6 Clicks" Mobility   Outcome Measure  Help needed turning from your back to your side while in a flat bed without using bedrails?: A Lot Help needed moving from lying on your back to sitting on the side of a flat bed without using bedrails?: A Lot Help needed moving to and from a bed to a chair (including a wheelchair)?: Total Help needed standing up from a chair using your arms (e.g., wheelchair or bedside chair)?: A Lot Help needed to walk in hospital room?: Total Help needed climbing 3-5 steps with a railing? : Total 6 Click Score: 9    End of Session Equipment Utilized During Treatment: Gait belt (O2 removed by nursing) Activity Tolerance: Patient limited by pain Patient left: in chair;with call bell/phone within reach Nurse Communication: Mobility status;Need for lift equipment PT Visit Diagnosis: Other abnormalities of gait and mobility (R26.89);Pain;History of falling (Z91.81);Unsteadiness on feet (R26.81) Pain - Right/Left: Right Pain - part of body: Hip     Time: 1139-1203 PT Time Calculation (min) (ACUTE ONLY): 24 min  Charges:  $Therapeutic Activity: 23-37 mins           Ivar Drape 02/11/2023, 1:47 PM  Samul Dada, PT PhD Acute Rehab Dept. Number: [facility] [id number] and [facility] [phone]

## 2023-02-11 NOTE — TOC Progression Note (Addendum)
Transition of Care [facility]) - Progression Note    Patient Details  Name: DELPHINE CRYDER MRN: [id number] Date of Birth: 11/16/1935  Transition of Care [facility]) CM/SW Contact  Lorri Frederick, LCSW Phone Number: 02/11/2023, 11:05 AM  Clinical Narrative:   [facility] is full.  [facility] does offer a bed.  Pt and daughter [name] updated, asked for response from [facility] are full.  They accept offer at [facility].  [facility] informed and confirms they can receive pt today.   SNF auth submitted in [place] and approved: [id number], 3 days: 5/29-5/31.  MD informed.    Expected Discharge Plan: Skilled Nursing Facility Barriers to Discharge: Continued Medical Work up, SNF Pending bed offer  Expected Discharge Plan and Services In-house Referral: Clinical Social Work   Post Acute Care Choice: Skilled Nursing Facility Living arrangements for the past 2 months: Single Family Home                                       Social Determinants of Health (SDOH) Interventions SDOH Screenings   Tobacco Use: Medium Risk (02/06/2023)    Readmission Risk Interventions     No data to display

## 2023-02-11 NOTE — Progress Notes (Signed)
Right lower extremity venous duplex has been completed. Preliminary results can be found in CV Proc through chart review.   02/11/23 1:13 PM Olen Cordial RVT

## 2023-02-12 DIAGNOSIS — J690 Pneumonitis due to inhalation of food and vomit: Secondary | ICD-10-CM | POA: Diagnosis not present

## 2023-02-12 DIAGNOSIS — J441 Chronic obstructive pulmonary disease with (acute) exacerbation: Secondary | ICD-10-CM | POA: Diagnosis not present

## 2023-02-12 DIAGNOSIS — R339 Retention of urine, unspecified: Secondary | ICD-10-CM | POA: Diagnosis not present

## 2023-02-12 LAB — CULTURE, BLOOD (ROUTINE X 2)
Culture: NO GROWTH
Culture: NO GROWTH
Special Requests: ADEQUATE

## 2023-02-18 DIAGNOSIS — L89302 Pressure ulcer of unspecified buttock, stage 2: Secondary | ICD-10-CM | POA: Diagnosis not present

## 2023-02-20 DIAGNOSIS — L270 Generalized skin eruption due to drugs and medicaments taken internally: Secondary | ICD-10-CM | POA: Diagnosis not present

## 2023-02-24 DIAGNOSIS — S72031D Displaced midcervical fracture of right femur, subsequent encounter for closed fracture with routine healing: Secondary | ICD-10-CM | POA: Diagnosis not present

## 2023-02-24 DIAGNOSIS — Z471 Aftercare following joint replacement surgery: Secondary | ICD-10-CM | POA: Diagnosis not present

## 2023-02-25 DIAGNOSIS — L89302 Pressure ulcer of unspecified buttock, stage 2: Secondary | ICD-10-CM | POA: Diagnosis not present

## 2023-03-05 ENCOUNTER — Other Ambulatory Visit (HOSPITAL_COMMUNITY): Payer: Self-pay | Admitting: Family Medicine

## 2023-03-05 ENCOUNTER — Ambulatory Visit (HOSPITAL_COMMUNITY)
Admission: RE | Admit: 2023-03-05 | Discharge: 2023-03-05 | Disposition: A | Discharge status: Home / Self Care | Payer: Medicare PPO | Source: Ambulatory Visit | Attending: Vascular Surgery | Admitting: Vascular Surgery

## 2023-03-05 DIAGNOSIS — M7989 Other specified soft tissue disorders: Secondary | ICD-10-CM

## 2023-03-05 DIAGNOSIS — I10 Essential (primary) hypertension: Secondary | ICD-10-CM | POA: Diagnosis not present

## 2023-03-05 DIAGNOSIS — S72001D Fracture of unspecified part of neck of right femur, subsequent encounter for closed fracture with routine healing: Secondary | ICD-10-CM | POA: Diagnosis not present

## 2023-03-05 DIAGNOSIS — M810 Age-related osteoporosis without current pathological fracture: Secondary | ICD-10-CM | POA: Diagnosis not present

## 2023-03-05 DIAGNOSIS — Z9181 History of falling: Secondary | ICD-10-CM | POA: Diagnosis not present

## 2023-03-06 DIAGNOSIS — J9601 Acute respiratory failure with hypoxia: Secondary | ICD-10-CM | POA: Diagnosis not present

## 2023-03-06 DIAGNOSIS — I700 Atherosclerosis of aorta: Secondary | ICD-10-CM | POA: Diagnosis not present

## 2023-03-06 DIAGNOSIS — Z96641 Presence of right artificial hip joint: Secondary | ICD-10-CM | POA: Diagnosis not present

## 2023-03-06 DIAGNOSIS — D62 Acute posthemorrhagic anemia: Secondary | ICD-10-CM | POA: Diagnosis not present

## 2023-03-06 DIAGNOSIS — I10 Essential (primary) hypertension: Secondary | ICD-10-CM | POA: Diagnosis not present

## 2023-03-06 DIAGNOSIS — M80051D Age-related osteoporosis with current pathological fracture, right femur, subsequent encounter for fracture with routine healing: Secondary | ICD-10-CM | POA: Diagnosis not present

## 2023-03-06 DIAGNOSIS — J189 Pneumonia, unspecified organism: Secondary | ICD-10-CM | POA: Diagnosis not present

## 2023-03-06 DIAGNOSIS — J440 Chronic obstructive pulmonary disease with acute lower respiratory infection: Secondary | ICD-10-CM | POA: Diagnosis not present

## 2023-03-06 DIAGNOSIS — I2510 Atherosclerotic heart disease of native coronary artery without angina pectoris: Secondary | ICD-10-CM | POA: Diagnosis not present

## 2023-03-09 DIAGNOSIS — D62 Acute posthemorrhagic anemia: Secondary | ICD-10-CM | POA: Diagnosis not present

## 2023-03-09 DIAGNOSIS — I2510 Atherosclerotic heart disease of native coronary artery without angina pectoris: Secondary | ICD-10-CM | POA: Diagnosis not present

## 2023-03-09 DIAGNOSIS — J440 Chronic obstructive pulmonary disease with acute lower respiratory infection: Secondary | ICD-10-CM | POA: Diagnosis not present

## 2023-03-09 DIAGNOSIS — I10 Essential (primary) hypertension: Secondary | ICD-10-CM | POA: Diagnosis not present

## 2023-03-09 DIAGNOSIS — J9601 Acute respiratory failure with hypoxia: Secondary | ICD-10-CM | POA: Diagnosis not present

## 2023-03-09 DIAGNOSIS — M80051D Age-related osteoporosis with current pathological fracture, right femur, subsequent encounter for fracture with routine healing: Secondary | ICD-10-CM | POA: Diagnosis not present

## 2023-03-09 DIAGNOSIS — Z96641 Presence of right artificial hip joint: Secondary | ICD-10-CM | POA: Diagnosis not present

## 2023-03-09 DIAGNOSIS — I700 Atherosclerosis of aorta: Secondary | ICD-10-CM | POA: Diagnosis not present

## 2023-03-09 DIAGNOSIS — J189 Pneumonia, unspecified organism: Secondary | ICD-10-CM | POA: Diagnosis not present

## 2023-03-10 DIAGNOSIS — J9601 Acute respiratory failure with hypoxia: Secondary | ICD-10-CM | POA: Diagnosis not present

## 2023-03-10 DIAGNOSIS — I2510 Atherosclerotic heart disease of native coronary artery without angina pectoris: Secondary | ICD-10-CM | POA: Diagnosis not present

## 2023-03-10 DIAGNOSIS — D62 Acute posthemorrhagic anemia: Secondary | ICD-10-CM | POA: Diagnosis not present

## 2023-03-10 DIAGNOSIS — J440 Chronic obstructive pulmonary disease with acute lower respiratory infection: Secondary | ICD-10-CM | POA: Diagnosis not present

## 2023-03-10 DIAGNOSIS — I10 Essential (primary) hypertension: Secondary | ICD-10-CM | POA: Diagnosis not present

## 2023-03-10 DIAGNOSIS — Z96641 Presence of right artificial hip joint: Secondary | ICD-10-CM | POA: Diagnosis not present

## 2023-03-10 DIAGNOSIS — I700 Atherosclerosis of aorta: Secondary | ICD-10-CM | POA: Diagnosis not present

## 2023-03-10 DIAGNOSIS — J189 Pneumonia, unspecified organism: Secondary | ICD-10-CM | POA: Diagnosis not present

## 2023-03-10 DIAGNOSIS — M80051D Age-related osteoporosis with current pathological fracture, right femur, subsequent encounter for fracture with routine healing: Secondary | ICD-10-CM | POA: Diagnosis not present

## 2023-03-12 DIAGNOSIS — J440 Chronic obstructive pulmonary disease with acute lower respiratory infection: Secondary | ICD-10-CM | POA: Diagnosis not present

## 2023-03-12 DIAGNOSIS — Z96641 Presence of right artificial hip joint: Secondary | ICD-10-CM | POA: Diagnosis not present

## 2023-03-12 DIAGNOSIS — M80051D Age-related osteoporosis with current pathological fracture, right femur, subsequent encounter for fracture with routine healing: Secondary | ICD-10-CM | POA: Diagnosis not present

## 2023-03-12 DIAGNOSIS — D62 Acute posthemorrhagic anemia: Secondary | ICD-10-CM | POA: Diagnosis not present

## 2023-03-12 DIAGNOSIS — I10 Essential (primary) hypertension: Secondary | ICD-10-CM | POA: Diagnosis not present

## 2023-03-12 DIAGNOSIS — J189 Pneumonia, unspecified organism: Secondary | ICD-10-CM | POA: Diagnosis not present

## 2023-03-12 DIAGNOSIS — I2510 Atherosclerotic heart disease of native coronary artery without angina pectoris: Secondary | ICD-10-CM | POA: Diagnosis not present

## 2023-03-12 DIAGNOSIS — J9601 Acute respiratory failure with hypoxia: Secondary | ICD-10-CM | POA: Diagnosis not present

## 2023-03-12 DIAGNOSIS — I700 Atherosclerosis of aorta: Secondary | ICD-10-CM | POA: Diagnosis not present

## 2023-03-17 DIAGNOSIS — J189 Pneumonia, unspecified organism: Secondary | ICD-10-CM | POA: Diagnosis not present

## 2023-03-17 DIAGNOSIS — M80051D Age-related osteoporosis with current pathological fracture, right femur, subsequent encounter for fracture with routine healing: Secondary | ICD-10-CM | POA: Diagnosis not present

## 2023-03-17 DIAGNOSIS — D62 Acute posthemorrhagic anemia: Secondary | ICD-10-CM | POA: Diagnosis not present

## 2023-03-17 DIAGNOSIS — I10 Essential (primary) hypertension: Secondary | ICD-10-CM | POA: Diagnosis not present

## 2023-03-17 DIAGNOSIS — Z96641 Presence of right artificial hip joint: Secondary | ICD-10-CM | POA: Diagnosis not present

## 2023-03-17 DIAGNOSIS — J9601 Acute respiratory failure with hypoxia: Secondary | ICD-10-CM | POA: Diagnosis not present

## 2023-03-17 DIAGNOSIS — I2510 Atherosclerotic heart disease of native coronary artery without angina pectoris: Secondary | ICD-10-CM | POA: Diagnosis not present

## 2023-03-17 DIAGNOSIS — J440 Chronic obstructive pulmonary disease with acute lower respiratory infection: Secondary | ICD-10-CM | POA: Diagnosis not present

## 2023-03-17 DIAGNOSIS — I700 Atherosclerosis of aorta: Secondary | ICD-10-CM | POA: Diagnosis not present

## 2023-03-20 DIAGNOSIS — I10 Essential (primary) hypertension: Secondary | ICD-10-CM | POA: Diagnosis not present

## 2023-03-20 DIAGNOSIS — D62 Acute posthemorrhagic anemia: Secondary | ICD-10-CM | POA: Diagnosis not present

## 2023-03-20 DIAGNOSIS — J440 Chronic obstructive pulmonary disease with acute lower respiratory infection: Secondary | ICD-10-CM | POA: Diagnosis not present

## 2023-03-20 DIAGNOSIS — I2510 Atherosclerotic heart disease of native coronary artery without angina pectoris: Secondary | ICD-10-CM | POA: Diagnosis not present

## 2023-03-20 DIAGNOSIS — J189 Pneumonia, unspecified organism: Secondary | ICD-10-CM | POA: Diagnosis not present

## 2023-03-20 DIAGNOSIS — Z96641 Presence of right artificial hip joint: Secondary | ICD-10-CM | POA: Diagnosis not present

## 2023-03-20 DIAGNOSIS — M80051D Age-related osteoporosis with current pathological fracture, right femur, subsequent encounter for fracture with routine healing: Secondary | ICD-10-CM | POA: Diagnosis not present

## 2023-03-20 DIAGNOSIS — J9601 Acute respiratory failure with hypoxia: Secondary | ICD-10-CM | POA: Diagnosis not present

## 2023-03-20 DIAGNOSIS — I700 Atherosclerosis of aorta: Secondary | ICD-10-CM | POA: Diagnosis not present

## 2023-03-23 DIAGNOSIS — I10 Essential (primary) hypertension: Secondary | ICD-10-CM | POA: Diagnosis not present

## 2023-03-23 DIAGNOSIS — J440 Chronic obstructive pulmonary disease with acute lower respiratory infection: Secondary | ICD-10-CM | POA: Diagnosis not present

## 2023-03-23 DIAGNOSIS — J189 Pneumonia, unspecified organism: Secondary | ICD-10-CM | POA: Diagnosis not present

## 2023-03-23 DIAGNOSIS — Z96641 Presence of right artificial hip joint: Secondary | ICD-10-CM | POA: Diagnosis not present

## 2023-03-23 DIAGNOSIS — I2510 Atherosclerotic heart disease of native coronary artery without angina pectoris: Secondary | ICD-10-CM | POA: Diagnosis not present

## 2023-03-23 DIAGNOSIS — J9601 Acute respiratory failure with hypoxia: Secondary | ICD-10-CM | POA: Diagnosis not present

## 2023-03-23 DIAGNOSIS — M80051D Age-related osteoporosis with current pathological fracture, right femur, subsequent encounter for fracture with routine healing: Secondary | ICD-10-CM | POA: Diagnosis not present

## 2023-03-23 DIAGNOSIS — I700 Atherosclerosis of aorta: Secondary | ICD-10-CM | POA: Diagnosis not present

## 2023-03-23 DIAGNOSIS — D62 Acute posthemorrhagic anemia: Secondary | ICD-10-CM | POA: Diagnosis not present

## 2023-03-24 DIAGNOSIS — S72031D Displaced midcervical fracture of right femur, subsequent encounter for closed fracture with routine healing: Secondary | ICD-10-CM | POA: Diagnosis not present

## 2023-03-24 DIAGNOSIS — M1711 Unilateral primary osteoarthritis, right knee: Secondary | ICD-10-CM | POA: Diagnosis not present

## 2023-03-26 DIAGNOSIS — Z96641 Presence of right artificial hip joint: Secondary | ICD-10-CM | POA: Diagnosis not present

## 2023-03-26 DIAGNOSIS — J9601 Acute respiratory failure with hypoxia: Secondary | ICD-10-CM | POA: Diagnosis not present

## 2023-03-26 DIAGNOSIS — I2510 Atherosclerotic heart disease of native coronary artery without angina pectoris: Secondary | ICD-10-CM | POA: Diagnosis not present

## 2023-03-26 DIAGNOSIS — D62 Acute posthemorrhagic anemia: Secondary | ICD-10-CM | POA: Diagnosis not present

## 2023-03-26 DIAGNOSIS — M80051D Age-related osteoporosis with current pathological fracture, right femur, subsequent encounter for fracture with routine healing: Secondary | ICD-10-CM | POA: Diagnosis not present

## 2023-03-26 DIAGNOSIS — J440 Chronic obstructive pulmonary disease with acute lower respiratory infection: Secondary | ICD-10-CM | POA: Diagnosis not present

## 2023-03-26 DIAGNOSIS — I700 Atherosclerosis of aorta: Secondary | ICD-10-CM | POA: Diagnosis not present

## 2023-03-26 DIAGNOSIS — I10 Essential (primary) hypertension: Secondary | ICD-10-CM | POA: Diagnosis not present

## 2023-03-26 DIAGNOSIS — J189 Pneumonia, unspecified organism: Secondary | ICD-10-CM | POA: Diagnosis not present

## 2023-03-31 DIAGNOSIS — J9601 Acute respiratory failure with hypoxia: Secondary | ICD-10-CM | POA: Diagnosis not present

## 2023-03-31 DIAGNOSIS — Z96641 Presence of right artificial hip joint: Secondary | ICD-10-CM | POA: Diagnosis not present

## 2023-03-31 DIAGNOSIS — I10 Essential (primary) hypertension: Secondary | ICD-10-CM | POA: Diagnosis not present

## 2023-03-31 DIAGNOSIS — D62 Acute posthemorrhagic anemia: Secondary | ICD-10-CM | POA: Diagnosis not present

## 2023-03-31 DIAGNOSIS — I700 Atherosclerosis of aorta: Secondary | ICD-10-CM | POA: Diagnosis not present

## 2023-03-31 DIAGNOSIS — M80051D Age-related osteoporosis with current pathological fracture, right femur, subsequent encounter for fracture with routine healing: Secondary | ICD-10-CM | POA: Diagnosis not present

## 2023-03-31 DIAGNOSIS — J440 Chronic obstructive pulmonary disease with acute lower respiratory infection: Secondary | ICD-10-CM | POA: Diagnosis not present

## 2023-03-31 DIAGNOSIS — I2510 Atherosclerotic heart disease of native coronary artery without angina pectoris: Secondary | ICD-10-CM | POA: Diagnosis not present

## 2023-03-31 DIAGNOSIS — J189 Pneumonia, unspecified organism: Secondary | ICD-10-CM | POA: Diagnosis not present

## 2023-04-06 DIAGNOSIS — Z96641 Presence of right artificial hip joint: Secondary | ICD-10-CM | POA: Diagnosis not present

## 2023-04-06 DIAGNOSIS — I10 Essential (primary) hypertension: Secondary | ICD-10-CM | POA: Diagnosis not present

## 2023-04-06 DIAGNOSIS — M80051D Age-related osteoporosis with current pathological fracture, right femur, subsequent encounter for fracture with routine healing: Secondary | ICD-10-CM | POA: Diagnosis not present

## 2023-04-06 DIAGNOSIS — J440 Chronic obstructive pulmonary disease with acute lower respiratory infection: Secondary | ICD-10-CM | POA: Diagnosis not present

## 2023-04-06 DIAGNOSIS — D62 Acute posthemorrhagic anemia: Secondary | ICD-10-CM | POA: Diagnosis not present

## 2023-04-06 DIAGNOSIS — I700 Atherosclerosis of aorta: Secondary | ICD-10-CM | POA: Diagnosis not present

## 2023-04-06 DIAGNOSIS — J9601 Acute respiratory failure with hypoxia: Secondary | ICD-10-CM | POA: Diagnosis not present

## 2023-04-06 DIAGNOSIS — J189 Pneumonia, unspecified organism: Secondary | ICD-10-CM | POA: Diagnosis not present

## 2023-04-06 DIAGNOSIS — I2510 Atherosclerotic heart disease of native coronary artery without angina pectoris: Secondary | ICD-10-CM | POA: Diagnosis not present

## 2023-04-07 DIAGNOSIS — D62 Acute posthemorrhagic anemia: Secondary | ICD-10-CM | POA: Diagnosis not present

## 2023-04-07 DIAGNOSIS — Z96641 Presence of right artificial hip joint: Secondary | ICD-10-CM | POA: Diagnosis not present

## 2023-04-07 DIAGNOSIS — I2510 Atherosclerotic heart disease of native coronary artery without angina pectoris: Secondary | ICD-10-CM | POA: Diagnosis not present

## 2023-04-07 DIAGNOSIS — J189 Pneumonia, unspecified organism: Secondary | ICD-10-CM | POA: Diagnosis not present

## 2023-04-07 DIAGNOSIS — J9601 Acute respiratory failure with hypoxia: Secondary | ICD-10-CM | POA: Diagnosis not present

## 2023-04-07 DIAGNOSIS — J440 Chronic obstructive pulmonary disease with acute lower respiratory infection: Secondary | ICD-10-CM | POA: Diagnosis not present

## 2023-04-07 DIAGNOSIS — I700 Atherosclerosis of aorta: Secondary | ICD-10-CM | POA: Diagnosis not present

## 2023-04-07 DIAGNOSIS — M80051D Age-related osteoporosis with current pathological fracture, right femur, subsequent encounter for fracture with routine healing: Secondary | ICD-10-CM | POA: Diagnosis not present

## 2023-04-07 DIAGNOSIS — I10 Essential (primary) hypertension: Secondary | ICD-10-CM | POA: Diagnosis not present

## 2023-04-14 DIAGNOSIS — J440 Chronic obstructive pulmonary disease with acute lower respiratory infection: Secondary | ICD-10-CM | POA: Diagnosis not present

## 2023-04-14 DIAGNOSIS — I2510 Atherosclerotic heart disease of native coronary artery without angina pectoris: Secondary | ICD-10-CM | POA: Diagnosis not present

## 2023-04-14 DIAGNOSIS — J189 Pneumonia, unspecified organism: Secondary | ICD-10-CM | POA: Diagnosis not present

## 2023-04-14 DIAGNOSIS — D62 Acute posthemorrhagic anemia: Secondary | ICD-10-CM | POA: Diagnosis not present

## 2023-04-14 DIAGNOSIS — M80051D Age-related osteoporosis with current pathological fracture, right femur, subsequent encounter for fracture with routine healing: Secondary | ICD-10-CM | POA: Diagnosis not present

## 2023-04-14 DIAGNOSIS — I700 Atherosclerosis of aorta: Secondary | ICD-10-CM | POA: Diagnosis not present

## 2023-04-14 DIAGNOSIS — I10 Essential (primary) hypertension: Secondary | ICD-10-CM | POA: Diagnosis not present

## 2023-04-14 DIAGNOSIS — Z96641 Presence of right artificial hip joint: Secondary | ICD-10-CM | POA: Diagnosis not present

## 2023-04-14 DIAGNOSIS — J9601 Acute respiratory failure with hypoxia: Secondary | ICD-10-CM | POA: Diagnosis not present

## 2023-04-21 ENCOUNTER — Ambulatory Visit: Payer: Self-pay | Admitting: Orthopedic Surgery

## 2023-04-21 DIAGNOSIS — T8130XD Disruption of wound, unspecified, subsequent encounter: Secondary | ICD-10-CM | POA: Diagnosis not present

## 2023-04-21 DIAGNOSIS — N181 Chronic kidney disease, stage 1: Secondary | ICD-10-CM

## 2023-04-21 NOTE — Patient Instructions (Addendum)
SURGICAL WAITING ROOM VISITATION  Patients having surgery or a procedure may have no more than 2 support people in the waiting area - these visitors may rotate.    Children under the age of 37 must have an adult with them who is not the patient.  Due to an increase in RSV and influenza rates and associated hospitalizations, children ages 70 and under may not visit patients in [facility] hospitals.  If the patient needs to stay at the hospital during part of their recovery, the visitor guidelines for inpatient rooms apply. Pre-op nurse will coordinate an appropriate time for 1 support person to accompany patient in pre-op.  This support person may not rotate.    Please refer to the [facility] website for the visitor guidelines for Inpatients (after your surgery is over and you are in a regular room).       Your procedure is scheduled on:  04/23/23    Report to [facility] Main Entrance    Report to admitting at  1115 AM   Call this number if you have problems the morning of surgery [phone]   Do not eat food :After Midnight.   After Midnight you may have the following liquids until __ 1015____ AM  DAY OF SURGERY  Water Non-Citrus Juices (without pulp, NO RED-Apple, White grape, White cranberry) Black Coffee (NO MILK/CREAM OR CREAMERS, sugar ok)  Clear Tea (NO MILK/CREAM OR CREAMERS, sugar ok) regular and decaf                             Plain Jell-O (NO RED)                                           Fruit ices (not with fruit pulp, NO RED)                                     Popsicles (NO RED)                                                               Sports drinks like Gatorade (NO RED)         The day of surgery:  Drink ONE (1) Pre-Surgery Clear Ensure or G2 at  1015 AM ( have completed by )  the morning of surgery. Drink in one sitting. Do not sip.  This drink was given to you during your hospital  pre-op appointment visit. Nothing else to drink after  completing the  Pre-Surgery Clear Ensure or G2.          If you have questions, please contact your surgeon's office.       Oral Hygiene is also important to reduce your risk of infection.                                    Remember - BRUSH YOUR TEETH THE MORNING OF SURGERY WITH YOUR REGULAR TOOTHPASTE  DENTURES WILL BE REMOVED PRIOR TO  SURGERY PLEASE DO NOT APPLY "Poly grip" OR ADHESIVES!!!   Do NOT smoke after Midnight   Stop all vitamins and herbal supplements 7 days before surgery.   Take these medicines the morning of surgery with A SIP OF WATER:  Inhalers as usual and bring, nebulzier if needed, tapazole if due to take   DO NOT TAKE ANY ORAL DIABETIC MEDICATIONS DAY OF YOUR SURGERY  Bring CPAP mask and tubing day of surgery.                              You may not have any metal on your body including hair pins, jewelry, and body piercing             Do not wear make-up, lotions, powders, perfumes/cologne, or deodorant  Do not wear nail polish including gel and S&S, artificial/acrylic nails, or any other type of covering on natural nails including finger and toenails. If you have artificial nails, gel coating, etc. that needs to be removed by a nail salon please have this removed prior to surgery or surgery may need to be canceled/ delayed if the surgeon/ anesthesia feels like they are unable to be safely monitored.   Do not shave  48 hours prior to surgery.               Men may shave face and neck.   Do not bring valuables to the hospital. [facility] IS NOT             RESPONSIBLE   FOR VALUABLES.   Contacts, glasses, dentures or bridgework may not be worn into surgery.   Bring small overnight bag day of surgery.   DO NOT BRING YOUR HOME MEDICATIONS TO THE HOSPITAL. PHARMACY WILL DISPENSE MEDICATIONS LISTED ON YOUR MEDICATION LIST TO YOU DURING YOUR ADMISSION IN THE HOSPITAL!    Patients discharged on the day of surgery will not be allowed to drive home.  Someone  NEEDS to stay with you for the first 24 hours after anesthesia.   Special Instructions: Bring a copy of your healthcare power of attorney and living will documents the day of surgery if you haven't scanned them before.              Please read over the following fact sheets you were given: IF YOU HAVE QUESTIONS ABOUT YOUR PRE-OP INSTRUCTIONS PLEASE CALL [phone]   If you received a COVID test during your pre-op visit  it is requested that you wear a mask when out in public, stay away from anyone that may not be feeling well and notify your surgeon if you develop symptoms. If you test positive for Covid or have been in contact with anyone that has tested positive in the last 10 days please notify you surgeon.    [place] - Preparing for Surgery Before surgery, you can play an important role.  Because skin is not sterile, your skin needs to be as free of germs as possible.  You can reduce the number of germs on your skin by washing with CHG (chlorahexidine gluconate) soap before surgery.  CHG is an antiseptic cleaner which kills germs and bonds with the skin to continue killing germs even after washing. Please DO NOT use if you have an allergy to CHG or antibacterial soaps.  If your skin becomes reddened/irritated stop using the CHG and inform your nurse when you arrive at Short Stay. Do not shave (including  legs and underarms) for at least 48 hours prior to the first CHG shower.  You may shave your face/neck. Please follow these instructions carefully:  1.  Shower with CHG Soap the night before surgery and the  morning of Surgery.  2.  If you choose to wash your hair, wash your hair first as usual with your  normal  shampoo.  3.  After you shampoo, rinse your hair and body thoroughly to remove the  shampoo.                           4.  Use CHG as you would any other liquid soap.  You can apply chg directly  to the skin and wash                       Gently with a scrungie or clean  washcloth.  5.  Apply the CHG Soap to your body ONLY FROM THE NECK DOWN.   Do not use on face/ open                           Wound or open sores. Avoid contact with eyes, ears mouth and genitals (private parts).                       Wash face,  Genitals (private parts) with your normal soap.             6.  Wash thoroughly, paying special attention to the area where your surgery  will be performed.  7.  Thoroughly rinse your body with warm water from the neck down.  8.  DO NOT shower/wash with your normal soap after using and rinsing off  the CHG Soap.                9.  Pat yourself dry with a clean towel.            10.  Wear clean pajamas.            11.  Place clean sheets on your bed the night of your first shower and do not  sleep with pets. Day of Surgery : Do not apply any lotions/deodorants the morning of surgery.  Please wear clean clothes to the hospital/surgery center.  FAILURE TO FOLLOW THESE INSTRUCTIONS MAY RESULT IN THE CANCELLATION OF YOUR SURGERY PATIENT SIGNATURE_________________________________  NURSE SIGNATURE__________________________________  ________________________________________________________________________

## 2023-04-21 NOTE — Progress Notes (Signed)
Anesthesia Review:  PCP: Ariel Lambert  Cardiologist : none  Chest x-ray : CT Chest- 02/09/23  EKG :  04/22/23 - Jacobo Forest aware of result Echo : 2022  Stress test: Cardiac Cath :  Activity level:  ? Using walker  Sleep Study/ CPAP : none  Fasting Blood Sugar :      / Checks Blood Sugar -- times a day:   Blood Thinner/ Instructions /Last Dose: ASA / Instructions/ Last Dose :    02/06/23- Anterior Hip  PT states she developed open wound with drainage at right hip site.

## 2023-04-22 ENCOUNTER — Other Ambulatory Visit: Payer: Self-pay

## 2023-04-22 ENCOUNTER — Encounter (HOSPITAL_COMMUNITY)
Admission: RE | Admit: 2023-04-22 | Discharge: 2023-04-22 | Disposition: A | Discharge status: Home / Self Care | Payer: Medicare PPO | Source: Ambulatory Visit | Attending: Orthopedic Surgery | Admitting: Orthopedic Surgery

## 2023-04-22 ENCOUNTER — Encounter (HOSPITAL_COMMUNITY): Payer: Self-pay

## 2023-04-22 VITALS — BP 126/88 | HR 86 | Temp 98.3°F | Resp 16 | Ht 62.0 in

## 2023-04-22 DIAGNOSIS — M810 Age-related osteoporosis without current pathological fracture: Secondary | ICD-10-CM | POA: Diagnosis present

## 2023-04-22 DIAGNOSIS — Y839 Surgical procedure, unspecified as the cause of abnormal reaction of the patient, or of later complication, without mention of misadventure at the time of the procedure: Secondary | ICD-10-CM | POA: Insufficient documentation

## 2023-04-22 DIAGNOSIS — Z96641 Presence of right artificial hip joint: Secondary | ICD-10-CM | POA: Insufficient documentation

## 2023-04-22 DIAGNOSIS — Z01812 Encounter for preprocedural laboratory examination: Secondary | ICD-10-CM | POA: Insufficient documentation

## 2023-04-22 DIAGNOSIS — T8131XA Disruption of external operation (surgical) wound, not elsewhere classified, initial encounter: Secondary | ICD-10-CM | POA: Diagnosis present

## 2023-04-22 DIAGNOSIS — T8149XA Infection following a procedure, other surgical site, initial encounter: Secondary | ICD-10-CM | POA: Diagnosis present

## 2023-04-22 DIAGNOSIS — Z974 Presence of external hearing-aid: Secondary | ICD-10-CM | POA: Diagnosis not present

## 2023-04-22 DIAGNOSIS — J449 Chronic obstructive pulmonary disease, unspecified: Secondary | ICD-10-CM | POA: Diagnosis not present

## 2023-04-22 DIAGNOSIS — I10 Essential (primary) hypertension: Secondary | ICD-10-CM | POA: Diagnosis present

## 2023-04-22 DIAGNOSIS — Z803 Family history of malignant neoplasm of breast: Secondary | ICD-10-CM | POA: Diagnosis not present

## 2023-04-22 DIAGNOSIS — B9620 Unspecified Escherichia coli [E. coli] as the cause of diseases classified elsewhere: Secondary | ICD-10-CM | POA: Diagnosis present

## 2023-04-22 DIAGNOSIS — Z96652 Presence of left artificial knee joint: Secondary | ICD-10-CM | POA: Diagnosis present

## 2023-04-22 DIAGNOSIS — J4489 Other specified chronic obstructive pulmonary disease: Secondary | ICD-10-CM | POA: Diagnosis present

## 2023-04-22 DIAGNOSIS — Z888 Allergy status to other drugs, medicaments and biological substances status: Secondary | ICD-10-CM | POA: Diagnosis not present

## 2023-04-22 DIAGNOSIS — Z7982 Long term (current) use of aspirin: Secondary | ICD-10-CM | POA: Diagnosis not present

## 2023-04-22 DIAGNOSIS — Z885 Allergy status to narcotic agent status: Secondary | ICD-10-CM | POA: Diagnosis not present

## 2023-04-22 DIAGNOSIS — Z9841 Cataract extraction status, right eye: Secondary | ICD-10-CM | POA: Diagnosis not present

## 2023-04-22 DIAGNOSIS — J441 Chronic obstructive pulmonary disease with (acute) exacerbation: Secondary | ICD-10-CM | POA: Diagnosis not present

## 2023-04-22 DIAGNOSIS — N181 Chronic kidney disease, stage 1: Secondary | ICD-10-CM | POA: Insufficient documentation

## 2023-04-22 DIAGNOSIS — F32A Depression, unspecified: Secondary | ICD-10-CM | POA: Diagnosis present

## 2023-04-22 DIAGNOSIS — Z7951 Long term (current) use of inhaled steroids: Secondary | ICD-10-CM | POA: Diagnosis not present

## 2023-04-22 DIAGNOSIS — T8140XA Infection following a procedure, unspecified, initial encounter: Secondary | ICD-10-CM | POA: Diagnosis not present

## 2023-04-22 DIAGNOSIS — E0590 Thyrotoxicosis, unspecified without thyrotoxic crisis or storm: Secondary | ICD-10-CM | POA: Diagnosis present

## 2023-04-22 DIAGNOSIS — Y792 Prosthetic and other implants, materials and accessory orthopedic devices associated with adverse incidents: Secondary | ICD-10-CM | POA: Insufficient documentation

## 2023-04-22 DIAGNOSIS — Z01818 Encounter for other preprocedural examination: Secondary | ICD-10-CM

## 2023-04-22 DIAGNOSIS — Z8673 Personal history of transient ischemic attack (TIA), and cerebral infarction without residual deficits: Secondary | ICD-10-CM | POA: Diagnosis not present

## 2023-04-22 DIAGNOSIS — T8141XA Infection following a procedure, superficial incisional surgical site, initial encounter: Secondary | ICD-10-CM | POA: Diagnosis present

## 2023-04-22 DIAGNOSIS — Z7983 Long term (current) use of bisphosphonates: Secondary | ICD-10-CM | POA: Diagnosis not present

## 2023-04-22 DIAGNOSIS — Z79899 Other long term (current) drug therapy: Secondary | ICD-10-CM | POA: Diagnosis not present

## 2023-04-22 DIAGNOSIS — Z961 Presence of intraocular lens: Secondary | ICD-10-CM | POA: Diagnosis present

## 2023-04-22 DIAGNOSIS — Z882 Allergy status to sulfonamides status: Secondary | ICD-10-CM | POA: Diagnosis not present

## 2023-04-22 DIAGNOSIS — Z87891 Personal history of nicotine dependence: Secondary | ICD-10-CM | POA: Diagnosis not present

## 2023-04-22 DIAGNOSIS — E785 Hyperlipidemia, unspecified: Secondary | ICD-10-CM | POA: Diagnosis present

## 2023-04-22 HISTORY — DX: Pneumonia, unspecified organism: J18.9

## 2023-04-22 LAB — COMPREHENSIVE METABOLIC PANEL
ALT: 14 U/L (ref 0–44)
AST: 11 U/L — ABNORMAL LOW (ref 15–41)
Albumin: 3.3 g/dL — ABNORMAL LOW (ref 3.5–5.0)
Alkaline Phosphatase: 76 U/L (ref 38–126)
Anion gap: 8 (ref 5–15)
BUN: 13 mg/dL (ref 8–23)
CO2: 28 mmol/L (ref 22–32)
Calcium: 9.5 mg/dL (ref 8.9–10.3)
Chloride: 103 mmol/L (ref 98–111)
Creatinine, Ser: 0.64 mg/dL (ref 0.44–1.00)
GFR, Estimated: >60 mL/min (ref >60)
Glucose, Bld: 131 mg/dL — ABNORMAL HIGH (ref 70–99)
Potassium: 3.6 mmol/L (ref 3.5–5.1)
Sodium: 139 mmol/L (ref 135–145)
Total Bilirubin: 0.4 mg/dL (ref 0.3–1.2)
Total Protein: 6.7 g/dL (ref 6.5–8.1)

## 2023-04-22 LAB — CBC
HCT: 38.9 % (ref 36.0–46.0)
Hemoglobin: 12.1 g/dL (ref 12.0–15.0)
MCH: 30.8 pg (ref 26.0–34.0)
MCHC: 31.1 g/dL (ref 30.0–36.0)
MCV: 99 fL (ref 80.0–100.0)
Platelets: 286 10*3/uL (ref 150–400)
RBC: 3.93 MIL/uL (ref 3.87–5.11)
RDW: 15.3 % (ref 11.5–15.5)
WBC: 5.2 10*3/uL (ref 4.0–10.5)
nRBC: 0 % (ref 0.0–0.2)

## 2023-04-22 LAB — URINALYSIS, ROUTINE W REFLEX MICROSCOPIC
Bilirubin Urine: NEGATIVE
Glucose, UA: NEGATIVE mg/dL
Hgb urine dipstick: NEGATIVE
Ketones, ur: NEGATIVE mg/dL
Leukocytes,Ua: NEGATIVE
Nitrite: NEGATIVE
Protein, ur: NEGATIVE mg/dL
Specific Gravity, Urine: 1.016 (ref 1.005–1.030)
pH: 5 (ref 5.0–8.0)

## 2023-04-22 LAB — SURGICAL PCR SCREEN
MRSA, PCR: NEGATIVE
Staphylococcus aureus: NEGATIVE

## 2023-04-22 NOTE — Progress Notes (Signed)
Case: [id number] Date/Time: 04/23/23 1305   Procedures:      IRRIGATION AND DEBRIDEMENT HIP WOUND (Right) - 220     Possible headball exchange (Right: Hip) - 220   Anesthesia type: General   Pre-op diagnosis: Right hip wound dehisence   Location: [facility] ROOM 08 / [facility]   Surgeons: Samson Frederic, MD       DISCUSSION: Ariel Lambert is an 87 yo female who presents to PAT prior to sugery above as a same day work up. PMH significant for former smoking, asthma/COPD, HTN, carotid artery disease s/p bilateral CEA in 2001 and repair of patch pseudoaneurysm in 09/2019, CKD, hx of CVA (2009), hyperthyroidism, s/p left TKA on 09/30/2021, s/p right THA after a fall on 02/06/23 c/b wound dehiscence. Now scheduled for I&D of R hip.  No prior anesthesia complications.  Patient last saw Cardiology in clinic on 05/07/2021 for follow up after repair of pseudoaneurysm. Noted to be doing well from cardiac standpoint. Cleared for surgery for L TKA at that time as intermediate risk. Does not have any other underlying cardiac issues. Takes ASA and a statin. BP is controlled. Has had multiple surgeries since that visit without anesthesia complications.  She previously followed with Vascular surgery after repair or pseudoaneurysm but has been lost to f/u. Last Carotid US in 2022 showed normal flow.  Previously followed with Pulmonology for asthma/COPD. Last seen in 2015. Has not had any recent exacerbations. Uses inhalers.  Follows with endocrinology for hyperthyroidism. Takes methimazole.   VS: BP 126/88   Pulse 86   Temp 36.8 C (Oral)   Resp 16   Ht 5\' 2"  (1.575 m)   SpO2 95%   BMI 24.23 kg/m   PROVIDERS: Mila Palmer, MD   LABS: Pending, eval DOS (all labs ordered are listed, but only abnormal results are displayed)  Labs Reviewed  SURGICAL PCR SCREEN  CBC  COMPREHENSIVE METABOLIC PANEL  URINALYSIS, ROUTINE W REFLEX MICROSCOPIC     IMAGES:  CT Right hip 02/09/23:  IMPRESSION: 1.  Status post right hip arthroplasty. The hardware is intact. There is soft tissue edema and multiple foci of gas in the lateral aspect of the femoral component in the gluteal region as well as about the distal aspect of the femoral component. These may represent postsurgical changes in this patient with recent hip replacement, however infectious/inflammatory process can not be excluded. Clinical correlation is suggested. 2. Marked subcutaneous soft tissue edema about the lateral aspect of the hip joint/gluteal region. Fluid collection about the site of prior surgical incision measuring up to 5.8 x 3.5 cm.     EKG 04/22/23:  NSR, rate 87 LAD Incomplete RBBB LVH Septal infarct, age undetermined Appears similar to prior EKG  CV:  Carotid US 11/02/2020:  Summary:  Right Carotid: Velocities in the right ICA are consistent with a 1-39%  stenosis.                No evidence of pseusoaneurysm.   Left Carotid: Velocities in the left ICA are consistent with a 1-39%  stenosis.   Vertebrals: Bilateral vertebral arteries demonstrate antegrade flow.  Subclavians: Normal flow hemodynamics were seen in bilateral subclavian               arteries.   Echo 10/08/20:  IMPRESSIONS     1. Left ventricular ejection fraction, by estimation, is 60 to 65%. The  left ventricle has normal function. The left ventricle has no regional  wall motion abnormalities. There is  mild concentric left ventricular  hypertrophy. Left ventricular diastolic  parameters are consistent with Grade I diastolic dysfunction (impaired  relaxation). Elevated left atrial pressure.   2. Right ventricular systolic function is normal. The right ventricular  size is normal. There is normal pulmonary artery systolic pressure. The  estimated right ventricular systolic pressure is 29.0 mmHg.   3. Left atrial size was moderately dilated.   4. The mitral valve is normal in structure. Mild mitral valve  regurgitation. No evidence  of mitral stenosis. Moderate mitral annular  calcification.   5. The aortic valve is normal in structure. There is moderate  calcification of the aortic valve. There is moderate thickening of the  aortic valve. Aortic valve regurgitation is trivial. Mild to moderate  aortic valve sclerosis/calcification is present,  without any evidence of aortic stenosis.   6. Aortic dilatation noted. There is borderline dilatation of the  ascending aorta, measuring 40 mm.   7. The inferior vena cava is normal in size with greater than 50%  respiratory variability, suggesting right atrial pressure of 3 mmHg.   Conclusion(s)/Recommendation(s): No intracardiac source of embolism  detected on this transthoracic study. A transesophageal echocardiogram is  recommended to exclude cardiac source of embolism if clinically indicated.   Past Medical History:  Diagnosis Date   Allergy    Arthritis    ASTHMA 05/03/2009   Asthma    Carotid stenosis    Chronic kidney disease    " in remission"   COPD (chronic obstructive pulmonary disease) (HCC)    CVA (cerebral infarction)    Dyslipidemia    Headache    Hearing aid worn    B/L   HYPERTENSION 04/30/2007   off of meds since  01/2023   HYPERTHYROIDISM 06/17/2007   Hyperthyroidism    Menopause    Multinodular goiter    OSTEOPOROSIS 04/30/2007   Pneumonia    Stroke [facility])    Wears glasses     Past Surgical History:  Procedure Laterality Date   BACK SURGERY     BREAST EXCISIONAL BIOPSY Left    carotid artery surgery     CATARACT EXTRACTION W/ INTRAOCULAR LENS  IMPLANT, BILATERAL     ENDARTERECTOMY Right 09/29/2019   Procedure: REDO OF RIGHT ENDARTERECTOMY CAROTID;  Surgeon: Maeola Harman, MD;  Location: [facility] OR;  Service: Vascular;  Laterality: Right;   NECK SURGERY     OTHER SURGICAL HISTORY     discectomy   TOTAL HIP ARTHROPLASTY Right 02/06/2023   Procedure: ANTERIOR TOTAL HIP ARTHROPLASTY;  Surgeon: Samson Frederic, MD;  Location[facility];  Service: Orthopedics;  Laterality: Right;   TOTAL KNEE ARTHROPLASTY Left 09/30/2021   Procedure: TOTAL KNEE ARTHROPLASTY;  Surgeon: Ollen Gross, MD;  [facility];  Service: Orthopedics;  Laterality: Left;   WISDOM TOOTH EXTRACTION      MEDICATIONS:  acetaminophen (TYLENOL) 325 MG tablet   albuterol (PROVENTIL HFA;VENTOLIN HFA) 108 (90 BASE) MCG/ACT inhaler   alendronate (FOSAMAX) 70 MG tablet   aspirin EC 81 MG tablet   azelastine (ASTELIN) 0.1 % nasal spray   Biotin 5000 MCG CAPS   Calcium Carb-Cholecalciferol (CALCIUM 600+D3) 600-20 MG-MCG TABS   cefadroxil (DURICEF) 500 MG capsule   diphenhydrAMINE (BENADRYL) 25 MG tablet   docusate sodium (COLACE) 100 MG capsule   ezetimibe-simvastatin (VYTORIN) 10-20 MG per tablet   fluticasone (FLONASE) 50 MCG/ACT nasal spray   hydrocortisone cream 1 %   hydroxypropyl methylcellulose / hypromellose (ISOPTO TEARS / GONIOVISC) 2.5 % ophthalmic  solution   levalbuterol (XOPENEX) 0.63 MG/3ML nebulizer solution   loratadine (ALLERGY RELIEF) 10 MG tablet   MAGNESIUM GLUCONATE PO   methimazole (TAPAZOLE) 10 MG tablet   montelukast (SINGULAIR) 10 MG tablet   Multiple Vitamin (MULTIVITAMIN WITH MINERALS) TABS tablet   mupirocin ointment (BACTROBAN) 2 %   ondansetron (ZOFRAN) 4 MG tablet   polyethylene glycol (MIRALAX / GLYCOLAX) 17 g packet   senna (SENOKOT) 8.6 MG TABS tablet   SYMBICORT 160-4.5 MCG/ACT inhaler   traZODone (DESYREL) 100 MG tablet   triamcinolone ointment (KENALOG) 0.1 %   No current facility-administered medications for this encounter.   Marcille Blanco [facility] Short Stay/Anesthesiology [facility] Phone [phone] 04/22/2023 3:16 PM

## 2023-04-22 NOTE — Anesthesia Preprocedure Evaluation (Addendum)
Anesthesia Evaluation  Patient identified by MRN, date of birth, ID band Patient awake    Reviewed: Allergy & Precautions, NPO status , Patient's Chart, lab work & pertinent test results  History of Anesthesia Complications Negative for: history of anesthetic complications  Airway Mallampati: I  TM Distance: >3 FB Neck ROM: Full    Dental  (+) Teeth Intact, Dental Advisory Given   Pulmonary COPD,  COPD inhaler, former smoker   breath sounds clear to auscultation       Cardiovascular hypertension,  Rhythm:Regular Rate:Normal  S/P bilateral carotid endarterectomies 2021   Neuro/Psych    Depression    CVA (2009)    GI/Hepatic negative GI ROS, Neg liver ROS,,,  Endo/Other   Hyperthyroidism   Renal/GU Renal InsufficiencyRenal disease  negative genitourinary   Musculoskeletal  (+) Arthritis ,  Right hip wound dehisence   Abdominal   Peds  Hematology negative hematology ROS (+)   Anesthesia Other Findings Day of surgery medications reviewed with patient.  Reproductive/Obstetrics                             Anesthesia Physical Anesthesia Plan  ASA: 3 and emergent  Anesthesia Plan: General   Post-op Pain Management: Tylenol PO (pre-op)*   Induction: Intravenous  PONV Risk Score and Plan: 3 and Ondansetron, Dexamethasone, Treatment may vary due to age or medical condition and TIVA  Airway Management Planned: Oral ETT  Additional Equipment: None  Intra-op Plan:   Post-operative Plan: Extubation in OR  Informed Consent: I have reviewed the patients History and Physical, chart, labs and discussed the procedure including the risks, benefits and alternatives for the proposed anesthesia with the patient or authorized representative who has indicated his/her understanding and acceptance.     Dental advisory given  Plan Discussed with:   Anesthesia Plan Comments: (See PAT note from 8/7 by  Sherlie Ban PA-C )       Anesthesia Quick Evaluation

## 2023-04-23 ENCOUNTER — Encounter (HOSPITAL_COMMUNITY): Payer: Self-pay | Admitting: Orthopedic Surgery

## 2023-04-23 ENCOUNTER — Inpatient Hospital Stay (HOSPITAL_COMMUNITY)
Admission: RE | Admit: 2023-04-23 | Discharge: 2023-04-27 | DRG: 857 | Disposition: A | Discharge status: Home / Self Care | Payer: Medicare PPO | Attending: Orthopedic Surgery | Admitting: Orthopedic Surgery

## 2023-04-23 DIAGNOSIS — Z79899 Other long term (current) drug therapy: Secondary | ICD-10-CM | POA: Diagnosis not present

## 2023-04-23 DIAGNOSIS — Z8673 Personal history of transient ischemic attack (TIA), and cerebral infarction without residual deficits: Secondary | ICD-10-CM

## 2023-04-23 DIAGNOSIS — Z96652 Presence of left artificial knee joint: Secondary | ICD-10-CM | POA: Diagnosis present

## 2023-04-23 DIAGNOSIS — T8141XA Infection following a procedure, superficial incisional surgical site, initial encounter: Principal | ICD-10-CM | POA: Diagnosis present

## 2023-04-23 DIAGNOSIS — Z803 Family history of malignant neoplasm of breast: Secondary | ICD-10-CM | POA: Diagnosis not present

## 2023-04-23 DIAGNOSIS — Z885 Allergy status to narcotic agent status: Secondary | ICD-10-CM | POA: Diagnosis not present

## 2023-04-23 DIAGNOSIS — Z888 Allergy status to other drugs, medicaments and biological substances status: Secondary | ICD-10-CM

## 2023-04-23 DIAGNOSIS — Z87891 Personal history of nicotine dependence: Secondary | ICD-10-CM

## 2023-04-23 DIAGNOSIS — J4489 Other specified chronic obstructive pulmonary disease: Secondary | ICD-10-CM | POA: Diagnosis present

## 2023-04-23 DIAGNOSIS — F32A Depression, unspecified: Secondary | ICD-10-CM | POA: Diagnosis present

## 2023-04-23 DIAGNOSIS — E785 Hyperlipidemia, unspecified: Secondary | ICD-10-CM | POA: Diagnosis present

## 2023-04-23 DIAGNOSIS — I10 Essential (primary) hypertension: Secondary | ICD-10-CM | POA: Diagnosis present

## 2023-04-23 DIAGNOSIS — Z9841 Cataract extraction status, right eye: Secondary | ICD-10-CM

## 2023-04-23 DIAGNOSIS — Y792 Prosthetic and other implants, materials and accessory orthopedic devices associated with adverse incidents: Secondary | ICD-10-CM | POA: Diagnosis present

## 2023-04-23 DIAGNOSIS — Z974 Presence of external hearing-aid: Secondary | ICD-10-CM

## 2023-04-23 DIAGNOSIS — T8131XA Disruption of external operation (surgical) wound, not elsewhere classified, initial encounter: Secondary | ICD-10-CM | POA: Diagnosis present

## 2023-04-23 DIAGNOSIS — Z7983 Long term (current) use of bisphosphonates: Secondary | ICD-10-CM

## 2023-04-23 DIAGNOSIS — T8149XA Infection following a procedure, other surgical site, initial encounter: Secondary | ICD-10-CM | POA: Diagnosis present

## 2023-04-23 DIAGNOSIS — Z961 Presence of intraocular lens: Secondary | ICD-10-CM | POA: Diagnosis present

## 2023-04-23 DIAGNOSIS — Z7951 Long term (current) use of inhaled steroids: Secondary | ICD-10-CM

## 2023-04-23 DIAGNOSIS — I4510 Unspecified right bundle-branch block: Secondary | ICD-10-CM | POA: Diagnosis present

## 2023-04-23 DIAGNOSIS — Z01812 Encounter for preprocedural laboratory examination: Secondary | ICD-10-CM

## 2023-04-23 DIAGNOSIS — B9620 Unspecified Escherichia coli [E. coli] as the cause of diseases classified elsewhere: Secondary | ICD-10-CM | POA: Diagnosis present

## 2023-04-23 DIAGNOSIS — Z96641 Presence of right artificial hip joint: Secondary | ICD-10-CM | POA: Diagnosis present

## 2023-04-23 DIAGNOSIS — Z9842 Cataract extraction status, left eye: Secondary | ICD-10-CM

## 2023-04-23 DIAGNOSIS — E0590 Thyrotoxicosis, unspecified without thyrotoxic crisis or storm: Secondary | ICD-10-CM | POA: Diagnosis present

## 2023-04-23 DIAGNOSIS — M810 Age-related osteoporosis without current pathological fracture: Secondary | ICD-10-CM | POA: Diagnosis present

## 2023-04-23 DIAGNOSIS — Z7982 Long term (current) use of aspirin: Secondary | ICD-10-CM | POA: Diagnosis not present

## 2023-04-23 DIAGNOSIS — Z882 Allergy status to sulfonamides status: Secondary | ICD-10-CM | POA: Diagnosis not present

## 2023-04-23 DIAGNOSIS — J449 Chronic obstructive pulmonary disease, unspecified: Secondary | ICD-10-CM | POA: Diagnosis not present

## 2023-04-23 HISTORY — DX: Infection following a procedure, other surgical site, initial encounter: T81.49XA

## 2023-04-23 LAB — BASIC METABOLIC PANEL
Anion gap: 9 (ref 5–15)
BUN: 10 mg/dL (ref 8–23)
CO2: 28 mmol/L (ref 22–32)
Calcium: 9 mg/dL (ref 8.9–10.3)
Chloride: 103 mmol/L (ref 98–111)
Creatinine, Ser: 0.46 mg/dL (ref 0.44–1.00)
GFR, Estimated: >60 mL/min (ref >60)
Glucose, Bld: 151 mg/dL — ABNORMAL HIGH (ref 70–99)
Potassium: 3.8 mmol/L (ref 3.5–5.1)
Sodium: 140 mmol/L (ref 135–145)

## 2023-04-23 LAB — CBC
HCT: 38.5 % (ref 36.0–46.0)
Hemoglobin: 11.8 g/dL — ABNORMAL LOW (ref 12.0–15.0)
MCH: 30.2 pg (ref 26.0–34.0)
MCHC: 30.6 g/dL (ref 30.0–36.0)
MCV: 98.5 fL (ref 80.0–100.0)
Platelets: 285 10*3/uL (ref 150–400)
RBC: 3.91 MIL/uL (ref 3.87–5.11)
RDW: 15.3 % (ref 11.5–15.5)
WBC: 5.3 10*3/uL (ref 4.0–10.5)
nRBC: 0 % (ref 0.0–0.2)

## 2023-04-23 LAB — TYPE AND SCREEN
ABO/RH(D): O POS
Antibody Screen: NEGATIVE

## 2023-04-23 LAB — C-REACTIVE PROTEIN: CRP: 1.4 mg/dL — ABNORMAL HIGH (ref <1.0)

## 2023-04-23 LAB — SEDIMENTATION RATE: Sed Rate: 40 mm/hr — ABNORMAL HIGH (ref 0–22)

## 2023-04-23 MED ORDER — MOMETASONE FURO-FORMOTEROL FUM 200-5 MCG/ACT IN AERO
2.0000 | INHALATION_SPRAY | Freq: Two times a day (BID) | RESPIRATORY_TRACT | Status: DC
  Administered 2023-04-23 – 2023-04-27 (×8): 2 via RESPIRATORY_TRACT
  Filled 2023-04-23: qty 8.8

## 2023-04-23 MED ORDER — ALBUTEROL SULFATE (2.5 MG/3ML) 0.083% IN NEBU: 2.5000 mg | INHALATION_SOLUTION | Freq: Four times a day (QID) | RESPIRATORY_TRACT | Status: DC | PRN

## 2023-04-23 MED ORDER — ACETAMINOPHEN 325 MG PO TABS: 650.0000 mg | ORAL_TABLET | Freq: Four times a day (QID) | ORAL | Status: DC | PRN

## 2023-04-23 MED ORDER — LEVALBUTEROL HCL 0.63 MG/3ML IN NEBU: 0.6300 mg | INHALATION_SOLUTION | Freq: Four times a day (QID) | RESPIRATORY_TRACT | Status: DC | PRN

## 2023-04-23 MED ORDER — FLUTICASONE PROPIONATE 50 MCG/ACT NA SUSP: 2.0000 | Freq: Every day | NASAL | Status: DC | PRN

## 2023-04-23 MED ORDER — SENNA 8.6 MG PO TABS
1.0000 | ORAL_TABLET | Freq: Two times a day (BID) | ORAL | Status: DC
  Administered 2023-04-24 – 2023-04-27 (×6): 8.6 mg via ORAL
  Filled 2023-04-23 (×6): qty 1

## 2023-04-23 MED ORDER — MONTELUKAST SODIUM 10 MG PO TABS
10.0000 mg | ORAL_TABLET | Freq: Every day | ORAL | Status: DC
  Administered 2023-04-23 – 2023-04-26 (×4): 10 mg via ORAL
  Filled 2023-04-23 (×4): qty 1

## 2023-04-23 MED ORDER — SIMVASTATIN 20 MG PO TABS
20.0000 mg | ORAL_TABLET | Freq: Every day | ORAL | Status: DC
  Administered 2023-04-23 – 2023-04-26 (×3): 20 mg via ORAL
  Filled 2023-04-23 (×3): qty 1

## 2023-04-23 MED ORDER — EZETIMIBE 10 MG PO TABS
10.0000 mg | ORAL_TABLET | Freq: Every day | ORAL | Status: DC
  Administered 2023-04-23 – 2023-04-27 (×4): 10 mg via ORAL
  Filled 2023-04-23 (×4): qty 1

## 2023-04-23 MED ORDER — TRAZODONE HCL 100 MG PO TABS
100.0000 mg | ORAL_TABLET | Freq: Every day | ORAL | Status: DC
  Administered 2023-04-23 – 2023-04-26 (×4): 100 mg via ORAL
  Filled 2023-04-23 (×4): qty 1

## 2023-04-23 MED ORDER — METHIMAZOLE 5 MG PO TABS
5.0000 mg | ORAL_TABLET | ORAL | Status: DC
  Administered 2023-04-27: 5 mg via ORAL
  Filled 2023-04-23: qty 1

## 2023-04-23 MED ORDER — EZETIMIBE-SIMVASTATIN 10-20 MG PO TABS: 1.0000 | ORAL_TABLET | Freq: Every day | ORAL | Status: DC

## 2023-04-23 MED ORDER — METHIMAZOLE 5 MG PO TABS
10.0000 mg | ORAL_TABLET | ORAL | Status: DC
  Administered 2023-04-24 – 2023-04-26 (×2): 10 mg via ORAL
  Filled 2023-04-23 (×2): qty 2

## 2023-04-23 MED ORDER — SODIUM CHLORIDE 0.9 % IV SOLN
3.0000 g | Freq: Four times a day (QID) | INTRAVENOUS | Status: DC
  Administered 2023-04-23 – 2023-04-27 (×15): 3 g via INTRAVENOUS
  Filled 2023-04-23 (×17): qty 8

## 2023-04-23 MED ORDER — LORATADINE 10 MG PO TABS
10.0000 mg | ORAL_TABLET | Freq: Every day | ORAL | Status: DC
  Administered 2023-04-24 – 2023-04-27 (×3): 10 mg via ORAL
  Filled 2023-04-23 (×3): qty 1

## 2023-04-23 MED ORDER — POLYETHYLENE GLYCOL 3350 17 G PO PACK: 17.0000 g | PACK | Freq: Every day | ORAL | Status: DC | PRN

## 2023-04-23 MED ORDER — VANCOMYCIN HCL 750 MG/150ML IV SOLN
750.0000 mg | INTRAVENOUS | Status: DC
  Administered 2023-04-24: 750 mg via INTRAVENOUS
  Filled 2023-04-23 (×3): qty 150

## 2023-04-23 MED ORDER — DIPHENHYDRAMINE HCL 12.5 MG/5ML PO ELIX
25.0000 mg | ORAL_SOLUTION | Freq: Four times a day (QID) | ORAL | Status: DC | PRN
  Administered 2023-04-23 – 2023-04-27 (×7): 25 mg via ORAL
  Filled 2023-04-23 (×6): qty 10

## 2023-04-23 MED ORDER — VANCOMYCIN HCL IN DEXTROSE 1-5 GM/200ML-% IV SOLN
1000.0000 mg | Freq: Once | INTRAVENOUS | Status: AC
  Administered 2023-04-23: 1000 mg via INTRAVENOUS
  Filled 2023-04-23: qty 200

## 2023-04-23 MED ORDER — SODIUM CHLORIDE 0.9 % IV SOLN: INTRAVENOUS | Status: DC

## 2023-04-23 MED ORDER — AZELASTINE HCL 0.1 % NA SOLN: 2.0000 | Freq: Every evening | NASAL | Status: DC

## 2023-04-23 MED ORDER — ONDANSETRON HCL 4 MG PO TABS: 4.0000 mg | ORAL_TABLET | Freq: Four times a day (QID) | ORAL | Status: DC | PRN

## 2023-04-23 MED ORDER — DOCUSATE SODIUM 100 MG PO CAPS
100.0000 mg | ORAL_CAPSULE | Freq: Two times a day (BID) | ORAL | Status: DC
  Administered 2023-04-23 – 2023-04-25 (×4): 100 mg via ORAL
  Filled 2023-04-23 (×5): qty 1

## 2023-04-23 MED ORDER — ONDANSETRON HCL 4 MG/2ML IJ SOLN: 4.0000 mg | Freq: Four times a day (QID) | INTRAMUSCULAR | Status: DC | PRN

## 2023-04-23 NOTE — Plan of Care (Signed)
  Problem: Education: Goal: Knowledge of General Education information will improve Description: Including pain rating scale, medication(s)/side effects and non-pharmacologic comfort measures Outcome: Progressing   Problem: Activity: Goal: Risk for activity intolerance will decrease Outcome: Progressing   Problem: Nutrition: Goal: Adequate nutrition will be maintained Outcome: Progressing   

## 2023-04-23 NOTE — Progress Notes (Signed)
Pharmacy Antibiotic Note  Ariel Lambert is a 87 y.o. female s/p fall in May in 2024 with hip fracture. She underwent right THA on 02/06/23 and developed wound infection s/p procedure.  She presented to [facility] on 04/23/2023 for I&D.  Pharmacy has been consulted to dose vancomycin and unasyn for wound infection.  Today, 04/23/2023: - scr 0.64 on 04/23/23; crcl~41 - due to power outrage, I&D has been postponed until 8/9  Plan: - vancomycin 1000 mg IV x1, then 750mg  q24h for est AUC 505 - unasyn 3gm IV q6h  ____________________________________________  Temp (24hrs), Avg:98.3 F (36.8 C), Min:98.3 F (36.8 C), Max:98.3 F (36.8 C)  Recent Labs  Lab 04/22/23 1436  WBC 5.2  CREATININE 0.64    CrCl cannot be calculated (Unknown ideal weight.).    Allergies  Allergen Reactions   Cetirizine Other (See Comments)    Stomach pain   Codeine Itching    Severe itching   Hydrocodone Itching   Meperidine Hcl Diarrhea and Nausea And Vomiting    (Demerol)   Nabumetone Rash    (Relafen)   Naproxen Itching and Rash   Sulfa Antibiotics Rash   Sulfonamide Derivatives Rash      Thank you for allowing pharmacy to be a part of this patient's care.  Lucia Gaskins 04/23/2023 1:34 PM

## 2023-04-23 NOTE — H&P (Addendum)
ORTHOPAEDIC H&P  REQUESTING PHYSICIAN: Samson Frederic, MD  PCP:  Mila Palmer, MD  Chief Complaint: Wound infection right hip  HPI: Ariel Lambert is a 87 y.o. female who underwent right hip hemiarthroplasty for displaced femoral neck fracture on 02/06/2023.  Postoperatively, she developed a seroma that was aspirated in the office and found to be negative.  She came to the office earlier this week with a couple day history of erythema and drainage to the right hip.  I aspirated the subcutaneous fluid collection again, and the Gram stain showed gram-negative bacilli.  Final speciation and susceptibilities are pending.  She was indicated for debridement of the right hip, possible head ball exchange.  She came in for surgery today, and unfortunately the power went out.  All surgeries were cancelled. I therefore decided to admit her and start her on IV antibiotics until the OR schedule can accommodate her.  Past Medical History:  Diagnosis Date   Allergy    Arthritis    ASTHMA 05/03/2009   Asthma    Carotid stenosis    Chronic kidney disease    " in remission"   COPD (chronic obstructive pulmonary disease) (HCC)    CVA (cerebral infarction)    Dyslipidemia    Headache    Hearing aid worn    B/L   HYPERTENSION 04/30/2007   off of meds since  01/2023   HYPERTHYROIDISM 06/17/2007   Hyperthyroidism    Menopause    Multinodular goiter    OSTEOPOROSIS 04/30/2007   Pneumonia    Stroke [facility])    Wears glasses    Past Surgical History:  Procedure Laterality Date   BACK SURGERY     BREAST EXCISIONAL BIOPSY Left    carotid artery surgery     CATARACT EXTRACTION W/ INTRAOCULAR LENS  IMPLANT, BILATERAL     ENDARTERECTOMY Right 09/29/2019   Procedure: REDO OF RIGHT ENDARTERECTOMY CAROTID;  Surgeon: Maeola Harman, MD;  Location: [facility] OR;  Service: Vascular;  Laterality: Right;   NECK SURGERY     OTHER SURGICAL HISTORY     discectomy   TOTAL HIP ARTHROPLASTY  Right 02/06/2023   Procedure: ANTERIOR TOTAL HIP ARTHROPLASTY;  Surgeon: Samson Frederic, MD;  Location[facility];  Service: Orthopedics;  Laterality: Right;   TOTAL KNEE ARTHROPLASTY Left 09/30/2021   Procedure: TOTAL KNEE ARTHROPLASTY;  Surgeon: Ollen Gross, MD;  [facility];  Service: Orthopedics;  Laterality: Left;   WISDOM TOOTH EXTRACTION     Social History   Socioeconomic History   Marital status: Widowed    Spouse name: Not on file   Number of children: Not on file   Years of education: Not on file   Highest education level: Not on file  Occupational History   Occupation: Teacher    Employer: RETIRED  Tobacco Use   Smoking status: Former    Types: Cigarettes   Smokeless tobacco: Never   Tobacco comments:    "Quit smoking cigarettes in my 40's "  Vaping Use   Vaping status: Never Used  Substance and Sexual Activity   Alcohol use: Yes    Alcohol/week: 0.0 standard drinks of alcohol    Comment: Socially   Drug use: No   Sexual activity: Not on file  Other Topics Concern   Not on file  Social History Narrative   Lives alone.   Social Determinants of Health   Financial Resource Strain: Not on file  Food Insecurity: Not on file  Transportation Needs:  Not on file  Physical Activity: Not on file  Stress: Not on file  Social Connections: Not on file   Family History  Problem Relation Age of Onset   Heart disease Other    Breast cancer Mother 51   Allergies  Allergen Reactions   Cetirizine Other (See Comments)    Stomach pain   Codeine Itching    Severe itching   Hydrocodone Itching   Meperidine Hcl Diarrhea and Nausea And Vomiting    (Demerol)   Nabumetone Rash    (Relafen)   Naproxen Itching and Rash   Sulfa Antibiotics Rash   Sulfonamide Derivatives Rash   Prior to Admission medications   Medication Sig Start Date End Date Taking? Authorizing Provider  acetaminophen (TYLENOL) 325 MG tablet Take 2 tablets (650 mg total) by mouth every 6 (six)  hours as needed for fever, headache or mild pain. 02/11/23  Yes Sheikh, Omair Latif, DO  albuterol (PROVENTIL HFA;VENTOLIN HFA) 108 (90 BASE) MCG/ACT inhaler Inhale 2 puffs into the lungs every 6 (six) hours as needed. Patient taking differently: Inhale 2 puffs into the lungs every 6 (six) hours as needed for wheezing or shortness of breath (Asthma). 04/20/14  Yes Leslye Peer, MD  alendronate (FOSAMAX) 70 MG tablet Take 70 mg by mouth every Friday. Take with a full glass of water on an empty stomach.   Yes [provider]  aspirin EC 81 MG tablet Take 81 mg by mouth daily. Swallow whole.   Yes [provider]  azelastine (ASTELIN) 0.1 % nasal spray Place 2 sprays into both nostrils every evening. Use in each nostril as directed   Yes [provider]  Biotin 5000 MCG CAPS Take 5,000 mcg by mouth daily.   Yes [provider]  Calcium Carb-Cholecalciferol (CALCIUM 600+D3) 600-20 MG-MCG TABS Take 1 tablet by mouth 2 (two) times daily.   Yes [provider]  cefadroxil (DURICEF) 500 MG capsule Take 500 mg by mouth 2 (two) times daily. 04/21/23  Yes [provider]  diphenhydrAMINE (BENADRYL) 25 MG tablet 25 mg daily as needed for allergies.   Yes [provider]  docusate sodium (COLACE) 100 MG capsule Take 100 mg by mouth daily.   Yes [provider]  ezetimibe-simvastatin (VYTORIN) 10-20 MG per tablet Take 1 tablet by mouth daily after breakfast.   Yes [provider]  fluticasone (FLONASE) 50 MCG/ACT nasal spray Place 2 sprays into both nostrils daily as needed for allergies or rhinitis.   Yes [provider]  hydrocortisone cream 1 % Apply 1 Application topically daily as needed (Ezema).   Yes [provider]  hydroxypropyl methylcellulose / hypromellose (ISOPTO TEARS / GONIOVISC) 2.5 % ophthalmic solution Place 1 drop into both eyes daily.   Yes [provider]  levalbuterol (XOPENEX) 0.63 MG/3ML  nebulizer solution Take 3 mLs (0.63 mg total) by nebulization every 4 (four) hours. Patient taking differently: Take 0.63 mg by nebulization every 6 (six) hours as needed for wheezing or shortness of breath. 09/07/15  Yes Rhetta Mura, MD  loratadine (ALLERGY RELIEF) 10 MG tablet Take 10 mg by mouth daily.   Yes [provider]  MAGNESIUM GLUCONATE PO Take 200 mg by mouth daily.   Yes [provider]  methimazole (TAPAZOLE) 10 MG tablet Take 5-10 mg by mouth every other day. Alternate 5 mg one day and 10 mg the next day   Yes [provider]  montelukast (SINGULAIR) 10 MG tablet Take 10 mg  by mouth at bedtime.   Yes [provider]  Multiple Vitamin (MULTIVITAMIN WITH MINERALS) TABS tablet Take 1 tablet by mouth daily.   Yes [provider]  ondansetron (ZOFRAN) 4 MG tablet Take 1 tablet (4 mg total) by mouth every 6 (six) hours as needed for nausea. 02/11/23  Yes Sheikh, Omair Latif, DO  senna (SENOKOT) 8.6 MG TABS tablet Take 1 tablet (8.6 mg total) by mouth 2 (two) times daily. 02/11/23  Yes Sheikh, Omair Latif, DO  SYMBICORT 160-4.5 MCG/ACT inhaler Inhale 2 puffs into the lungs 2 (two) times daily. 07/09/15  Yes [provider]  traZODone (DESYREL) 100 MG tablet Take 100 mg by mouth at bedtime.   Yes [provider]  triamcinolone ointment (KENALOG) 0.1 % Apply 1 Application topically daily as needed (unknown).   Yes [provider]  mupirocin ointment (BACTROBAN) 2 % Place 1 Application into the nose 2 (two) times daily. Patient not taking: Reported on 04/21/2023 02/11/23   Marguerita Merles Latif, DO  polyethylene glycol (MIRALAX / GLYCOLAX) 17 g packet Take 17 g by mouth 2 (two) times daily. Patient not taking: Reported on 04/21/2023 02/11/23   Marguerita Merles Latif, DO   No results found.  Positive ROS: All other systems have been reviewed and were otherwise negative with the exception of those mentioned in the HPI and as  above.  Physical Exam: General: Alert, no acute distress Cardiovascular: No pedal edema Respiratory: No cyanosis, no use of accessory musculature GI: No organomegaly, abdomen is soft and non-tender Skin: No lesions in the area of chief complaint Neurologic: Sensation intact distally Psychiatric: Patient is competent for consent with normal mood and affect Lymphatic: No axillary or cervical lymphadenopathy  MUSCULOSKELETAL: Examination of the right hip reveals a small subcentimeter wound in the incision.  She has surrounding erythema and fluctuance and a couple small pinholes draining seropurulent material.  She has painless range of motion of the hip.  She is neurovascular intact distally.  Assessment: Surgical site infection (Gram Negative Bacilli) right hip, likely superficial  Plan: Patient was scheduled for surgery today, however the power went out and the OR has shut down.  I will plan to admit her to the hospital for IV antibiotics.  I placed an order for IV Unasyn and vancomycin per pharmacy consult.  We will check labs.  She may have a diet today, and n.p.o. after midnight tonight in case we can get her on the surgical schedule for tomorrow.  Hold chemical DVT prophylaxis.    Jonette Pesa, MD [phone]    04/23/2023 1:20 PM

## 2023-04-23 NOTE — Plan of Care (Signed)

## 2023-04-24 MED ORDER — ENOXAPARIN SODIUM 30 MG/0.3ML IJ SOSY
30.0000 mg | PREFILLED_SYRINGE | Freq: Once | INTRAMUSCULAR | Status: AC
  Administered 2023-04-24: 30 mg via SUBCUTANEOUS
  Filled 2023-04-24: qty 0.3

## 2023-04-24 MED ORDER — ENSURE PRE-SURGERY PO LIQD
296.0000 mL | Freq: Once | ORAL | Status: AC
  Administered 2023-04-24: 296 mL via ORAL
  Filled 2023-04-24: qty 296

## 2023-04-24 NOTE — TOC Initial Note (Signed)
Transition of Care [facility]) - Initial/Assessment Note   Patient Details  Name: Ariel Lambert MRN: [id number] Date of Birth: Jan 18, 1936  Transition of Care [facility]) CM/SW Contact:    Ewing Schlein, LCSW Phone Number: 04/24/2023, 12:24 PM  Clinical Narrative: TOC following for possible discharge needs.                 Expected Discharge Plan:  (TBD) Barriers to Discharge: Continued Medical Work up  Expected Discharge Plan and Services In-house Referral: Clinical Social Work Living arrangements for the past 2 months: Single Family Home  Prior Living Arrangements/Services Living arrangements for the past 2 months: Single Family Home Patient language and need for interpreter reviewed:: Yes Need for Family Participation in Patient Care: Yes (Comment) Care giver support system in place?: Yes (comment) Criminal Activity/Legal Involvement Pertinent to Current Situation/Hospitalization: No - Comment as needed  Activities of Daily Living Home Assistive Devices/Equipment: Walker (specify type) ADL Screening (condition at time of admission) Patient's cognitive ability adequate to safely complete daily activities?: Yes Is the patient deaf or have difficulty hearing?: No Does the patient have difficulty seeing, even when wearing glasses/contacts?: No Does the patient have difficulty concentrating, remembering, or making decisions?: No Patient able to express need for assistance with ADLs?: Yes Does the patient have difficulty dressing or bathing?: No Independently performs ADLs?: Yes (appropriate for developmental age) Does the patient have difficulty walking or climbing stairs?: Yes Weakness of Legs: Both Weakness of Arms/Hands: Both  Emotional Assessment Orientation: : Oriented to Self Alcohol / Substance Use: Not Applicable Psych Involvement: No (comment)  Admission diagnosis:  Surgical site infection [T81.49XA] Patient Active Problem List   Diagnosis Date Noted   Surgical site  infection 04/23/2023   Acute urinary retention 02/09/2023   PNA (pneumonia) 02/08/2023   Fever 02/07/2023   Constipation 02/07/2023   S/P right hip fracture 02/05/2023   Leukocytosis 02/05/2023   Acute hypoxic respiratory failure (HCC) 02/05/2023   Hyperthyroidism 02/05/2023   Hypertension 02/05/2023   Chronic obstructive pulmonary disease (HCC) 02/05/2023   Primary osteoarthritis of left knee 09/30/2021   Anemia 10/11/2020   Carotid artery stenosis 10/11/2020   Cerebral infarction due to unspecified occlusion or stenosis of left middle cerebral artery (HCC) 10/11/2020   Cervical spondylosis without myelopathy 10/11/2020   Chronic kidney disease, stage 1 10/11/2020   Decreased estrogen level 10/11/2020   Hardening of the aorta (main artery of the heart) (HCC) 10/11/2020   Hematuria 10/11/2020   History of carotid endarterectomy 10/11/2020   Impairment of balance 10/11/2020   Insomnia 10/11/2020   Major depression in complete remission (HCC) 10/11/2020   Chronic nephritic syndrome, focal and segmental glomerular lesions 10/11/2020   Non-toxic goiter 10/11/2020   Occlusion and stenosis of bilateral carotid arteries 10/11/2020   Overactive bladder 10/11/2020   Protein calorie malnutrition (HCC) 10/11/2020   Psoriasis 10/11/2020   Vitamin D deficiency 10/11/2020   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/29/2019   Bilateral knee pain 07/01/2019   Neck nodule 06/22/2019   COPD exacerbation (HCC) 09/02/2015   Asthma exacerbation 09/02/2015   Allergic rhinitis 03/16/2013   Nontoxic multinodular goiter 12/30/2010   ASTHMA 05/03/2009   HYPOKALEMIA 06/13/2008   Thyrotoxicosis 06/17/2007   Essential hypertension 04/30/2007   OSTEOPOROSIS 04/30/2007   PCP:  Mila Palmer, MD Pharmacy:   [facility] [phone] - [place], [place] - [address] AT [address] OF [facility] [address] [place] [address] Phone: [phone] Fax: [phone]  Social  Determinants of Health (  SDOH) Social History: SDOH Screenings   Food Insecurity: Patient Declined (04/23/2023)  Housing: Patient Declined (04/23/2023)  Tobacco Use: Medium Risk (04/23/2023)   SDOH Interventions:    Readmission Risk Interventions     No data to display
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
## 2023-04-24 NOTE — Plan of Care (Signed)
Problem: Education: Goal: Knowledge of General Education information will improve Description: Including pain rating scale, medication(s)/side effects and non-pharmacologic comfort measures Outcome: Progressing   Problem: Clinical Measurements: Goal: Ability to maintain clinical measurements within normal limits will improve Outcome: Progressing   Problem: Nutrition: Goal: Adequate nutrition will be maintained Outcome: Progressing   Problem: Coping: Goal: Level of anxiety will decrease Outcome: Progressing   Problem: Pain Managment: Goal: General experience of comfort will improve Outcome: Progressing   Haydee Salter, RN 04/24/23 12:09 PM

## 2023-04-25 ENCOUNTER — Encounter (HOSPITAL_COMMUNITY)
Admission: RE | Disposition: A | Discharge status: Home / Self Care | Payer: Self-pay | Source: Ambulatory Visit | Attending: Orthopedic Surgery

## 2023-04-25 ENCOUNTER — Other Ambulatory Visit: Payer: Self-pay

## 2023-04-25 ENCOUNTER — Inpatient Hospital Stay (HOSPITAL_COMMUNITY): Payer: Medicare PPO | Admitting: Medical

## 2023-04-25 ENCOUNTER — Inpatient Hospital Stay (HOSPITAL_COMMUNITY): Payer: Medicare PPO | Admitting: Anesthesiology

## 2023-04-25 DIAGNOSIS — Z87891 Personal history of nicotine dependence: Secondary | ICD-10-CM

## 2023-04-25 DIAGNOSIS — I10 Essential (primary) hypertension: Secondary | ICD-10-CM

## 2023-04-25 DIAGNOSIS — J449 Chronic obstructive pulmonary disease, unspecified: Secondary | ICD-10-CM

## 2023-04-25 DIAGNOSIS — T8141XA Infection following a procedure, superficial incisional surgical site, initial encounter: Secondary | ICD-10-CM

## 2023-04-25 HISTORY — PX: INCISION AND DRAINAGE OF WOUND: SHX1803

## 2023-04-25 SURGERY — IRRIGATION AND DEBRIDEMENT WOUND
Anesthesia: General | Laterality: Right

## 2023-04-25 MED ORDER — PROPOFOL 10 MG/ML IV BOLUS
INTRAVENOUS | Status: DC | PRN
Start: 2023-04-25 — End: 2023-04-25
  Administered 2023-04-25: 90 mg via INTRAVENOUS

## 2023-04-25 MED ORDER — CHLORHEXIDINE GLUCONATE 4 % EX SOLN: 60.0000 mL | Freq: Once | CUTANEOUS | Status: DC

## 2023-04-25 MED ORDER — TRANEXAMIC ACID-NACL 1000-0.7 MG/100ML-% IV SOLN: 1000.0000 mg | INTRAVENOUS | Status: DC

## 2023-04-25 MED ORDER — ONDANSETRON HCL 4 MG/2ML IJ SOLN
INTRAMUSCULAR | Status: DC | PRN
  Administered 2023-04-25: 4 mg via INTRAVENOUS

## 2023-04-25 MED ORDER — ROCURONIUM BROMIDE 10 MG/ML (PF) SYRINGE
PREFILLED_SYRINGE | INTRAVENOUS | Status: DC | PRN
  Administered 2023-04-25: 60 mg via INTRAVENOUS

## 2023-04-25 MED ORDER — FENTANYL CITRATE PF 50 MCG/ML IJ SOSY
PREFILLED_SYRINGE | INTRAMUSCULAR | Status: AC
  Filled 2023-04-25: qty 3

## 2023-04-25 MED ORDER — LIDOCAINE 2% (20 MG/ML) 5 ML SYRINGE
INTRAMUSCULAR | Status: DC | PRN
  Administered 2023-04-25: 40 mg via INTRAVENOUS

## 2023-04-25 MED ORDER — CEFAZOLIN SODIUM-DEXTROSE 2-4 GM/100ML-% IV SOLN
2.0000 g | INTRAVENOUS | Status: AC
  Administered 2023-04-25: 2 g via INTRAVENOUS

## 2023-04-25 MED ORDER — SODIUM CHLORIDE 0.9 % IV SOLN: INTRAVENOUS | Status: DC

## 2023-04-25 MED ORDER — POVIDONE-IODINE 10 % EX SWAB: 2.0000 | Freq: Once | CUTANEOUS | Status: DC

## 2023-04-25 MED ORDER — DEXAMETHASONE SODIUM PHOSPHATE 10 MG/ML IJ SOLN
INTRAMUSCULAR | Status: DC | PRN
  Administered 2023-04-25: 4 mg via INTRAVENOUS

## 2023-04-25 MED ORDER — DOCUSATE SODIUM 100 MG PO CAPS
100.0000 mg | ORAL_CAPSULE | Freq: Two times a day (BID) | ORAL | Status: DC
  Administered 2023-04-26 – 2023-04-27 (×3): 100 mg via ORAL
  Filled 2023-04-25 (×4): qty 1

## 2023-04-25 MED ORDER — ACETAMINOPHEN 500 MG PO TABS: 1000.0000 mg | ORAL_TABLET | Freq: Once | ORAL | Status: DC

## 2023-04-25 MED ORDER — DEXAMETHASONE SODIUM PHOSPHATE 10 MG/ML IJ SOLN
INTRAMUSCULAR | Status: AC
  Filled 2023-04-25: qty 1

## 2023-04-25 MED ORDER — ACETAMINOPHEN 160 MG/5ML PO SOLN: 325.0000 mg | Freq: Once | ORAL | Status: DC | PRN

## 2023-04-25 MED ORDER — SUGAMMADEX SODIUM 200 MG/2ML IV SOLN
INTRAVENOUS | Status: DC | PRN
  Administered 2023-04-25: 200 mg via INTRAVENOUS

## 2023-04-25 MED ORDER — FENTANYL CITRATE (PF) 100 MCG/2ML IJ SOLN
INTRAMUSCULAR | Status: AC
  Filled 2023-04-25: qty 2

## 2023-04-25 MED ORDER — LACTATED RINGERS IV SOLN: INTRAVENOUS | Status: DC

## 2023-04-25 MED ORDER — TRANEXAMIC ACID-NACL 1000-0.7 MG/100ML-% IV SOLN
INTRAVENOUS | Status: AC
  Filled 2023-04-25: qty 100

## 2023-04-25 MED ORDER — ONDANSETRON HCL 4 MG PO TABS: 4.0000 mg | ORAL_TABLET | Freq: Four times a day (QID) | ORAL | Status: DC | PRN

## 2023-04-25 MED ORDER — METOCLOPRAMIDE HCL 5 MG PO TABS: 5.0000 mg | ORAL_TABLET | Freq: Three times a day (TID) | ORAL | Status: DC | PRN

## 2023-04-25 MED ORDER — ROCURONIUM BROMIDE 10 MG/ML (PF) SYRINGE
PREFILLED_SYRINGE | INTRAVENOUS | Status: AC
  Filled 2023-04-25: qty 10

## 2023-04-25 MED ORDER — FENTANYL CITRATE PF 50 MCG/ML IJ SOSY
25.0000 ug | PREFILLED_SYRINGE | INTRAMUSCULAR | Status: DC | PRN
  Administered 2023-04-25 (×3): 50 ug via INTRAVENOUS

## 2023-04-25 MED ORDER — LIDOCAINE HCL (PF) 2 % IJ SOLN
INTRAMUSCULAR | Status: AC
  Filled 2023-04-25: qty 10

## 2023-04-25 MED ORDER — SODIUM CHLORIDE 0.9 % IR SOLN
Status: DC | PRN
  Administered 2023-04-25: 3000 mL

## 2023-04-25 MED ORDER — ACETAMINOPHEN 10 MG/ML IV SOLN
INTRAVENOUS | Status: DC | PRN
Start: 2023-04-25 — End: 2023-04-25
  Administered 2023-04-25: 1000 mg via INTRAVENOUS

## 2023-04-25 MED ORDER — ISOPROPYL ALCOHOL 70 % SOLN
Status: DC | PRN
  Administered 2023-04-25: 1 via TOPICAL

## 2023-04-25 MED ORDER — HYDRALAZINE HCL 20 MG/ML IJ SOLN: 5.0000 mg | INTRAMUSCULAR | Status: DC | PRN

## 2023-04-25 MED ORDER — CEFAZOLIN SODIUM-DEXTROSE 2-4 GM/100ML-% IV SOLN
INTRAVENOUS | Status: AC
  Filled 2023-04-25: qty 100

## 2023-04-25 MED ORDER — LABETALOL HCL 5 MG/ML IV SOLN: 5.0000 mg | INTRAVENOUS | Status: DC | PRN

## 2023-04-25 MED ORDER — FENTANYL CITRATE (PF) 100 MCG/2ML IJ SOLN
INTRAMUSCULAR | Status: DC | PRN
  Administered 2023-04-25 (×2): 50 ug via INTRAVENOUS

## 2023-04-25 MED ORDER — ONDANSETRON HCL 4 MG/2ML IJ SOLN: 4.0000 mg | Freq: Four times a day (QID) | INTRAMUSCULAR | Status: DC | PRN

## 2023-04-25 MED ORDER — PROPOFOL 1000 MG/100ML IV EMUL
INTRAVENOUS | Status: AC
  Filled 2023-04-25: qty 100

## 2023-04-25 MED ORDER — PROPOFOL 10 MG/ML IV BOLUS
INTRAVENOUS | Status: AC
  Filled 2023-04-25: qty 20

## 2023-04-25 MED ORDER — ACETAMINOPHEN 10 MG/ML IV SOLN
INTRAVENOUS | Status: AC
  Filled 2023-04-25: qty 100

## 2023-04-25 MED ORDER — ACETAMINOPHEN 500 MG PO TABS
1000.0000 mg | ORAL_TABLET | Freq: Three times a day (TID) | ORAL | Status: DC | PRN
  Administered 2023-04-25 – 2023-04-27 (×4): 1000 mg via ORAL
  Filled 2023-04-25 (×5): qty 2

## 2023-04-25 MED ORDER — AMISULPRIDE (ANTIEMETIC) 5 MG/2ML IV SOLN: 10.0000 mg | Freq: Once | INTRAVENOUS | Status: DC | PRN

## 2023-04-25 MED ORDER — METOCLOPRAMIDE HCL 5 MG/ML IJ SOLN: 5.0000 mg | Freq: Three times a day (TID) | INTRAMUSCULAR | Status: DC | PRN

## 2023-04-25 MED ORDER — TRANEXAMIC ACID-NACL 1000-0.7 MG/100ML-% IV SOLN
1000.0000 mg | INTRAVENOUS | Status: AC
  Administered 2023-04-25: 1000 mg via INTRAVENOUS

## 2023-04-25 MED ORDER — ONDANSETRON HCL 4 MG/2ML IJ SOLN
INTRAMUSCULAR | Status: AC
  Filled 2023-04-25: qty 2

## 2023-04-25 MED ORDER — PHENYLEPHRINE HCL-NACL 20-0.9 MG/250ML-% IV SOLN
INTRAVENOUS | Status: DC | PRN
Start: 2023-04-25 — End: 2023-04-25
  Administered 2023-04-25: 120 ug via INTRAVENOUS

## 2023-04-25 MED ORDER — PROPOFOL 500 MG/50ML IV EMUL
INTRAVENOUS | Status: DC | PRN
Start: 2023-04-25 — End: 2023-04-25
  Administered 2023-04-25: 125 ug/kg/min via INTRAVENOUS

## 2023-04-25 MED ORDER — LACTATED RINGERS IV SOLN: INTRAVENOUS | Status: DC | PRN

## 2023-04-25 MED ORDER — ALBUMIN HUMAN 5 % IV SOLN
INTRAVENOUS | Status: AC
  Filled 2023-04-25: qty 250

## 2023-04-25 MED ORDER — PROMETHAZINE HCL 25 MG/ML IJ SOLN: 6.2500 mg | INTRAMUSCULAR | Status: DC | PRN

## 2023-04-25 MED ORDER — ACETAMINOPHEN 10 MG/ML IV SOLN: 1000.0000 mg | Freq: Once | INTRAVENOUS | Status: DC | PRN

## 2023-04-25 MED ORDER — ACETAMINOPHEN 325 MG PO TABS: 325.0000 mg | ORAL_TABLET | Freq: Once | ORAL | Status: DC | PRN

## 2023-04-25 SURGICAL SUPPLY — 57 items
ADH SKN CLS APL DERMABOND .7 (GAUZE/BANDAGES/DRESSINGS) ×2
APL PRP STRL LF DISP 70% ISPRP (MISCELLANEOUS) ×2
BAG COUNTER SPONGE SURGICOUNT (BAG) IMPLANT
BAG SPEC THK2 15X12 ZIP CLS (MISCELLANEOUS)
BAG SPNG CNTER NS LX DISP (BAG)
BAG ZIPLOCK 12X15 (MISCELLANEOUS) IMPLANT
BLADE SAW SGTL 11.0X1.19X90.0M (BLADE) IMPLANT
CANISTER WOUND CARE 500ML ATS (WOUND CARE) ×1 IMPLANT
CHLORAPREP W/TINT 26 (MISCELLANEOUS) ×3 IMPLANT
COVER PERINEAL POST (MISCELLANEOUS) ×3 IMPLANT
COVER SURGICAL LIGHT HANDLE (MISCELLANEOUS) ×3 IMPLANT
DERMABOND ADVANCED .7 DNX12 (GAUZE/BANDAGES/DRESSINGS) ×3 IMPLANT
DRAPE IMP U-DRAPE 54X76 (DRAPES) ×3 IMPLANT
DRAPE SHEET LG 3/4 BI-LAMINATE (DRAPES) ×9 IMPLANT
DRAPE STERI IOBAN 125X83 (DRAPES) ×3 IMPLANT
DRAPE U-SHAPE 47X51 STRL (DRAPES) ×6 IMPLANT
DRESSING PREVENA PLUS CUSTOM (GAUZE/BANDAGES/DRESSINGS) IMPLANT
DRSG AQUACEL AG ADV 3.5X10 (GAUZE/BANDAGES/DRESSINGS) ×3 IMPLANT
DRSG PREVENA PLUS CUSTOM (GAUZE/BANDAGES/DRESSINGS) ×2
DRSG TEGADERM 4X4.75 (GAUZE/BANDAGES/DRESSINGS) ×1 IMPLANT
ELECT BLADE TIP CTD 4 INCH (ELECTRODE) ×3 IMPLANT
EVACUATOR DRAINAGE 10X20 100CC (DRAIN) ×1 IMPLANT
EVACUATOR DRAINAGE 7X20 100CC (MISCELLANEOUS) IMPLANT
EVACUATOR SILICONE 100CC (DRAIN) ×2
EVACUATOR SILICONE 100CC (MISCELLANEOUS)
GAUZE 4X4 16PLY ~~LOC~~+RFID DBL (SPONGE) ×3 IMPLANT
GAUZE SPONGE 2X2 8PLY STRL LF (GAUZE/BANDAGES/DRESSINGS) ×1 IMPLANT
GLOVE BIO SURGEON STRL SZ8.5 (GLOVE) ×6 IMPLANT
GLOVE BIOGEL M 7.0 STRL (GLOVE) ×3 IMPLANT
GLOVE BIOGEL PI IND STRL 7.5 (GLOVE) ×6 IMPLANT
GLOVE BIOGEL PI IND STRL 8 (GLOVE) ×3 IMPLANT
GLOVE BIOGEL PI IND STRL 8.5 (GLOVE) ×3 IMPLANT
GLOVE SURG LX STRL 7.5 STRW (GLOVE) ×6 IMPLANT
GOWN SPEC L3 XXLG W/TWL (GOWN DISPOSABLE) ×6 IMPLANT
HOLDER FOLEY CATH W/STRAP (MISCELLANEOUS) ×3 IMPLANT
HOOD PEEL AWAY T7 (MISCELLANEOUS) ×12 IMPLANT
KIT TURNOVER KIT A (KITS) IMPLANT
NDL SPNL 18GX3.5 QUINCKE PK (NEEDLE) ×2 IMPLANT
NEEDLE SPNL 18GX3.5 QUINCKE PK (NEEDLE) ×2
PACK ANTERIOR HIP CUSTOM (KITS) ×3 IMPLANT
PENCIL SMOKE EVACUATOR (MISCELLANEOUS) IMPLANT
SAW OSC TIP CART 19.5X105X1.3 (SAW) ×3 IMPLANT
SEALER BIPOLAR AQUA 6.0 (INSTRUMENTS) ×3 IMPLANT
SOLUTION PRONTOSAN WOUND 350ML (IRRIGATION / IRRIGATOR) ×3 IMPLANT
SUT ETHIBOND NAB CT1 #1 30IN (SUTURE) ×6 IMPLANT
SUT ETHILON 3 0 FSL (SUTURE) ×1 IMPLANT
SUT MNCRL AB 3-0 PS2 18 (SUTURE) ×3 IMPLANT
SUT MNCRL AB 4-0 PS2 18 (SUTURE) ×3 IMPLANT
SUT MON AB 2-0 CT1 36 (SUTURE) ×6 IMPLANT
SUT STRATAFIX PDO 1 14 VIOLET (SUTURE) ×2
SUT STRATFX PDO 1 14 VIOLET (SUTURE) ×2
SUT VIC AB 2-0 CT1 27 (SUTURE) ×4
SUT VIC AB 2-0 CT1 TAPERPNT 27 (SUTURE) ×6 IMPLANT
SUTURE STRATFX PDO 1 14 VIOLET (SUTURE) ×2 IMPLANT
SYR 50ML LL SCALE MARK (SYRINGE) ×3 IMPLANT
TRAY FOLEY MTR SLVR 16FR STAT (SET/KITS/TRAYS/PACK) ×3 IMPLANT
WATER STERILE IRR 1000ML POUR (IV SOLUTION) ×3 IMPLANT

## 2023-04-25 NOTE — Anesthesia Postprocedure Evaluation (Signed)
Anesthesia Post Note  Patient: Ariel Lambert  Procedure(s) Performed: IRRIGATION AND DEBRIDEMENT HIP WOUND; APPLICATION OF NEGATIVE PRESSURE WOUND VAC (Right)     Patient location during evaluation: PACU Anesthesia Type: General Level of consciousness: awake and alert Pain management: pain level controlled Vital Signs Assessment: post-procedure vital signs reviewed and stable Respiratory status: spontaneous breathing, nonlabored ventilation, respiratory function stable and patient connected to nasal cannula oxygen Cardiovascular status: blood pressure returned to baseline and stable Postop Assessment: no apparent nausea or vomiting Anesthetic complications: no  No notable events documented.  Last Vitals:  Vitals:   04/25/23 1715 04/25/23 1817  BP: (!) 175/108 (!) 174/94  Pulse: (!) 58 63  Resp: 13 14  Temp:    SpO2: 97%     Last Pain:  Vitals:   04/25/23 1715  TempSrc:   PainSc: Asleep                 Shelton Silvas

## 2023-04-25 NOTE — Transfer of Care (Signed)
Immediate Anesthesia Transfer of Care Note  Patient: Ariel Lambert  Procedure(s) Performed: IRRIGATION AND DEBRIDEMENT HIP WOUND; APPLICATION OF NEGATIVE PRESSURE WOUND VAC (Right)  Patient Location: PACU  Anesthesia Type:General  Level of Consciousness: drowsy and patient cooperative  Airway & Oxygen Therapy: Patient Spontanous Breathing and Patient connected to face mask oxygen  Post-op Assessment: Report given to RN and Post -op Vital signs reviewed and stable  Post vital signs: Reviewed and stable  Last Vitals:  Vitals Value Taken Time  BP 176/92 04/25/23 1617  Temp    Pulse 62 04/25/23 1619  Resp 16 04/25/23 1620  SpO2 99 % 04/25/23 1619  Vitals shown include unfiled device data.  Last Pain:  Vitals:   04/25/23 1317  TempSrc: Oral  PainSc:          Complications: No notable events documented.

## 2023-04-25 NOTE — Interval H&P Note (Signed)
History and Physical Interval Note:  04/25/2023 2:02 PM  Ariel Lambert  has presented today for surgery, with the diagnosis of Right hip wound dehisence.  The various methods of treatment have been discussed with the patient and family. After consideration of risks, benefits and other options for treatment, the patient has consented to  Procedure(s) with comments: IRRIGATION AND DEBRIDEMENT HIP WOUND (Right) - 220 Possible headball exchange (Right) - 220 as a surgical intervention.  The patient's history has been reviewed, patient examined, no change in status, stable for surgery.  I have reviewed the patient's chart and labs.  Questions were answered to the patient's satisfaction.     Iline Oven 

## 2023-04-25 NOTE — Plan of Care (Signed)

## 2023-04-25 NOTE — Op Note (Signed)
OPERATIVE REPORT   04/25/2023  4:13 PM  PATIENT:  Ariel Lambert   SURGEON:  Jonette Pesa, MD  ASSISTANT:  Staff.   PREOPERATIVE DIAGNOSIS:  Superficial wound infection right hip.  POSTOPERATIVE DIAGNOSIS:  Same.  PROCEDURE:  1. Excisional debridement of skin and subcutaneous tissue right hip. 2. Application of negative pressure incisional dressing.  ANESTHESIA:   GETA.  ANTIBIOTICS:  Receiving scheduled IV Unasyn and Vancomycin.  IMPLANTS:  None.  TUBES AND DRAINS: 1. 10 mm flat JP drain in subcutaneous tissue. 2. Prevena negative pressure dressing.  SPECIMENS:  Right hip subcutaneous tissue for aerobic and anaerobic tissue culture.  COMPLICATIONS:  None.  DISPOSITION:  None.  SURGICAL INDICATIONS:  Ariel Lambert is a 87 y.o. female who underwent right hip hemiarthroplasty for displaced femoral neck fracture on 02/06/2023.  Postoperatively, she developed a seroma that was aspirated in the office and found to be negative.  She came to the office earlier this week with a couple day history of erythema and drainage to the right hip.  I aspirated the subcutaneous fluid collection again, and the Gram stain showed gram-negative bacilli.  Final culture grew E. coli.  She was indicated for debridement of the right hip, possible head ball exchange.   The risks, benefits, and alternatives were discussed with the patient. There are risks associated with the surgery including, but not limited to, problems with anesthesia (death), infection, instability (giving out of the joint), dislocation, differences in leg length/angulation/rotation, fracture of bones, loosening or failure of implants, hematoma (blood accumulation) which may require surgical drainage, blood clots, pulmonary embolism, nerve injury (foot drop and lateral thigh numbness), and blood vessel injury. The patient understands these risks and elects to proceed.  PROCEDURE IN DETAIL: The patient was  correctly identified in the holding area using 2 identifiers.  The surgical site was marked by myself.  She was taken the operating room, and general anesthesia was induced.  She was positioned on the Hana table.  All bony prominences were well-padded.  The right hip was prepped and draped in the normal sterile surgical fashion.  Timeout was called, verifying side and site of surgery.  The patient is receiving scheduled IV antibiotics on the floor.  I examined the right hip incision.  In the center of the incision, she had a 1 cm area of skin dehiscence.  Using a #10 blade, I sharply ellipsed this area, including skin and subcutaneous tissue.  The underlying subcutaneous tissue was completely intact without any tunneling.  Using curved Metzenbaum scissors, I created full-thickness skin flaps.  Using a rongeur, I excisionally debrided all nonviable subcutaneous tissue.  A representative sample of subcutaneous tissue was sent for aerobic and anaerobic culture.  The wound was then irrigated with normal saline using pulsatile lavage.  There was no deep involvement of her wound.  I did not dissect deep to the fascia.  I placed a 10 mm flat perforated JP drain in the subcutaneous tissues, and it was brought out through a separate stab incision over the lateral thigh distally.  The drain was sewn in with a 3-0 nylon suture.  The wound was then closed with 2-0 Monocryl for the deep dermal layer.  The skin was reapproximated with 2-0 nylon vertical mattress sutures and staples.  A portable Prevena customizable dressing was placed and hooked up to suction at 75 mmHg.  There was excellent seal without any leak.  The patient was then awakened from anesthesia, and taken to the  PACU in stable condition.  Sponge, needle, instrument counts were correct at the end of the case x 2.  There were no known complications.  POSTOPERATIVE PLAN: Postoperatively, the patient will be readmitted to the orthopedic floor.  Continue IV Unasyn  for now.  I discontinued her vancomycin.  I placed a copy of her office culture on her physical chart.  We will plan to monitor her intraoperative culture and drain.  We will discontinue the JP drain when output is less than 10 cc per shift.  Continue negative pressure dressing.  Upon discharge, her house VAC suction unit will be exchanged for a portable Prevena suction unit.  She will need to follow-up in the office within 7 days of discharge for removal of her negative pressure dressing.  As long as her intraoperative culture does not show any additional organisms, we will plan for discharge on oral levofloxacin.

## 2023-04-25 NOTE — Anesthesia Procedure Notes (Addendum)
Procedure Name: Intubation Date/Time: 04/25/2023 3:04 PM  Performed by: Epimenio Sarin, CRNAPre-anesthesia Checklist: Patient identified, Emergency Drugs available, Suction available, Patient being monitored and Timeout performed Patient Re-evaluated:Patient Re-evaluated prior to induction Oxygen Delivery Method: Circle system utilized Preoxygenation: Pre-oxygenation with 100% oxygen Induction Type: IV induction Ventilation: Mask ventilation without difficulty and Oral airway inserted - appropriate to patient size Laryngoscope Size: Mac and 3 Grade View: Grade I Tube type: Oral Tube size: 7.0 mm Number of attempts: 1 Airway Equipment and Method: Stylet Placement Confirmation: ETT inserted through vocal cords under direct vision, positive ETCO2 and breath sounds checked- equal and bilateral Secured at: 21 cm Tube secured with: Tape Dental Injury: Teeth and Oropharynx as per pre-operative assessment

## 2023-04-26 ENCOUNTER — Encounter (HOSPITAL_COMMUNITY): Payer: Self-pay | Admitting: Orthopedic Surgery

## 2023-04-26 LAB — BASIC METABOLIC PANEL WITH GFR
Anion gap: 10 (ref 5–15)
BUN: 9 mg/dL (ref 8–23)
CO2: 24 mmol/L (ref 22–32)
Calcium: 8.7 mg/dL — ABNORMAL LOW (ref 8.9–10.3)
Chloride: 105 mmol/L (ref 98–111)
Creatinine, Ser: 0.55 mg/dL (ref 0.44–1.00)
GFR, Estimated: >60 mL/min (ref >60)
Glucose, Bld: 137 mg/dL — ABNORMAL HIGH (ref 70–99)
Potassium: 3.6 mmol/L (ref 3.5–5.1)
Sodium: 139 mmol/L (ref 135–145)

## 2023-04-26 LAB — CBC
HCT: 36.9 % (ref 36.0–46.0)
Hemoglobin: 11.5 g/dL — ABNORMAL LOW (ref 12.0–15.0)
MCH: 30.7 pg (ref 26.0–34.0)
MCHC: 31.2 g/dL (ref 30.0–36.0)
MCV: 98.4 fL (ref 80.0–100.0)
Platelets: 237 10*3/uL (ref 150–400)
RBC: 3.75 MIL/uL — ABNORMAL LOW (ref 3.87–5.11)
RDW: 14.7 % (ref 11.5–15.5)
WBC: 3.7 10*3/uL — ABNORMAL LOW (ref 4.0–10.5)
nRBC: 0 % (ref 0.0–0.2)

## 2023-04-26 MED ORDER — TRAMADOL HCL 50 MG PO TABS: 50.0000 mg | ORAL_TABLET | Freq: Four times a day (QID) | ORAL | Status: DC | PRN

## 2023-04-26 MED ORDER — TRAMADOL HCL 50 MG PO TABS: 50.0000 mg | ORAL_TABLET | Freq: Four times a day (QID) | ORAL | Status: DC

## 2023-04-26 MED ORDER — OXYCODONE HCL 5 MG PO TABS
5.0000 mg | ORAL_TABLET | ORAL | Status: DC | PRN
  Administered 2023-04-26 – 2023-04-27 (×2): 5 mg via ORAL
  Filled 2023-04-26 (×2): qty 1

## 2023-04-26 NOTE — Progress Notes (Signed)
Pharmacy Antibiotic Note  Ariel Lambert is a 87 y.o. female s/p fall in May in 2024 with hip fracture. She underwent right THA on 02/06/23 and developed wound infection s/p procedure.  She presented to [facility] on 04/23/2023 for I&D.  Pharmacy has been consulted to dose Unasyn for wound infection.  I&D performed 8/10  Today, 04/26/2023: - scr 0.55 on 04/23/23; crcl~41  Plan: - Continue Unasyn 3gm IV q6h - Follow up renal function, culture results, and clinical course.   ____________________________________________  Temp (24hrs), Avg:97.7 F (36.5 C), Min:97.5 F (36.4 C), Max:97.8 F (36.6 C)  Recent Labs  Lab 04/22/23 1436 04/23/23 1651 04/26/23 0340  WBC 5.2 5.3 3.7*  CREATININE 0.64 0.46 0.55    Estimated Creatinine Clearance: 40.7 mL/min (by C-G formula based on SCr of 0.55 mg/dL).    Allergies  Allergen Reactions   Cetirizine Other (See Comments)    Stomach pain   Codeine Itching    Severe itching   Hydrocodone Itching   Meperidine Hcl Diarrhea and Nausea And Vomiting    (Demerol)   Nabumetone Rash    (Relafen)   Naproxen Itching and Rash   Sulfa Antibiotics Rash   Sulfonamide Derivatives Rash    Antimicrobials this admission: 8/8 Vancomycin>> 810 8/8 Unasyn >>  Microbiology results: 8/10 Right Hip SQ Tissue: pending  Thank you for allowing pharmacy to be a part of this patient's care.   Tylene Fantasia 04/26/2023 2:17 PM

## 2023-04-26 NOTE — Progress Notes (Signed)
Patient ID: Ariel Lambert, female   DOB: 1936-05-08, 87 y.o.   MRN: [id number] Subjective: 1 Day Post-Op Procedure(s) (LRB): IRRIGATION AND DEBRIDEMENT HIP WOUND; APPLICATION OF NEGATIVE PRESSURE WOUND VAC (Right)    Patient reports pain as mild. No events reported Very pleasant lady  Objective:   VITALS:   Vitals:   04/26/23 0108 04/26/23 0518  BP: (!) 157/99 (!) 143/96  Pulse: 63 65  Resp: 16 16  Temp: 97.8 F (36.6 C) 97.6 F (36.4 C)  SpO2: 93% 95%    Neurovascular intact Incision: dressing C/D/I JP output recorded as 20 cc for shift  LABS Recent Labs    04/23/23 1651 04/26/23 0340  HGB 11.8* 11.5*  HCT 38.5 36.9  WBC 5.3 3.7*  PLT 285 237    Recent Labs    04/23/23 1651 04/26/23 0340  NA 140 139  K 3.8 3.6  BUN 10 9  CREATININE 0.46 0.55  GLUCOSE 151* 137*    No results for input(s): "LABPT", "INR" in the last 72 hours.   Assessment/Plan: 1 Day Post-Op Procedure(s) (LRB): IRRIGATION AND DEBRIDEMENT HIP WOUND; APPLICATION OF NEGATIVE PRESSURE WOUND VAC (Right)   Advance diet Up with therapy Post op plan as reported from [place] in OP note They will see in am and determine discharge plan once JP output diminishes

## 2023-04-26 NOTE — Plan of Care (Signed)
  Problem: Education: Goal: Knowledge of General Education information will improve Description: Including pain rating scale, medication(s)/side effects and non-pharmacologic comfort measures Outcome: Progressing   Problem: Clinical Measurements: Goal: Ability to maintain clinical measurements within normal limits will improve Outcome: Progressing   Problem: Nutrition: Goal: Adequate nutrition will be maintained Outcome: Progressing   Problem: Coping: Goal: Level of anxiety will decrease Outcome: Progressing   Problem: Elimination: Goal: Will not experience complications related to urinary retention Outcome: Progressing   Problem: Pain Managment: Goal: General experience of comfort will improve Outcome: Progressing   Problem: Safety: Goal: Ability to remain free from injury will improve Outcome: Progressing

## 2023-04-27 MED ORDER — IRBESARTAN 150 MG PO TABS
300.0000 mg | ORAL_TABLET | Freq: Every day | ORAL | Status: DC
  Administered 2023-04-27: 300 mg via ORAL
  Filled 2023-04-27: qty 2

## 2023-04-27 MED ORDER — LEVOFLOXACIN 750 MG PO TABS: 750.0000 mg | ORAL_TABLET | Freq: Every day | ORAL | 0 refills | Status: AC

## 2023-04-27 MED ORDER — HYDROCHLOROTHIAZIDE 12.5 MG PO TABS
12.5000 mg | ORAL_TABLET | Freq: Every day | ORAL | Status: DC
  Administered 2023-04-27: 12.5 mg via ORAL
  Filled 2023-04-27: qty 1

## 2023-04-27 MED ORDER — TRAMADOL HCL 50 MG PO TABS: 50.0000 mg | ORAL_TABLET | Freq: Four times a day (QID) | ORAL | 0 refills | Status: AC | PRN

## 2023-04-27 NOTE — Discharge Instructions (Addendum)
   WBAT RLE with walker.  Do not remove your dressing. Keep dressing clean and dry.  Please charge prevena wound vac daily.  Continue to use ice for pain and swelling after surgery. Do not use any lotions or creams on the incision until instructed by your surgeon. Change dressing for JP drain site as needed with dry dressing. Do not remove prevena dressing over incision.  Please take antibiotics as directed.  Follow-up with PCP for blood pressure management. Check BP at home.

## 2023-04-27 NOTE — Plan of Care (Signed)
?  Problem: Activity: ?Goal: Risk for activity intolerance will decrease ?Outcome: Progressing ?  ?Problem: Safety: ?Goal: Ability to remain free from injury will improve ?Outcome: Progressing ?  ?Problem: Pain Managment: ?Goal: General experience of comfort will improve ?Outcome: Progressing ?  ?

## 2023-04-27 NOTE — TOC Transition Note (Signed)
Transition of Care [facility]) - CM/SW Discharge Note  Patient Details  Name: Ariel Lambert MRN: [id number] Date of Birth: 27-May-1936  [facility] of [facility]) CM/SW Contact:  Ewing Schlein, LCSW Phone Number: 04/27/2023, 1:23 PM  Clinical Narrative: No discharge needs identified. TOC signing off.  Final next level of care: Home/Self Care Barriers to Discharge: Barriers Resolved  Discharge Plan and Services Additional resources added to the After Visit Summary for   In-house Referral: Clinical Social Work DME Arranged: N/A DME Agency: NA  Social Determinants of Health (SDOH) Interventions SDOH Screenings   Food Insecurity: Patient Declined (04/23/2023)  Housing: Patient Declined (04/23/2023)  Tobacco Use: Medium Risk (04/23/2023)   Readmission Risk Interventions     No data to display

## 2023-04-27 NOTE — Plan of Care (Signed)

## 2023-04-27 NOTE — Progress Notes (Addendum)
    Subjective:  Patient reports pain as mild.  Denies N/V/CP/SOB/Abd pain. She denies any tingling or numbness in LE bilaterally. No significant pain reported, controlled with tylenol and tramadol.   Discussed antibiotics at discharge and return to office within 7 days.   Objective:   VITALS:   Vitals:   04/26/23 2038 04/26/23 2335 04/27/23 0554 04/27/23 0840  BP: (!) 180/109 (!) 166/92 (!) 172/99   Pulse: 72 63 70   Resp: 18 17 16    Temp: 98.3 F (36.8 C) 97.9 F (36.6 C) 97.9 F (36.6 C)   TempSrc: Oral Oral Oral   SpO2: 95% 97% 95% 90%  Weight:      Height:        NAD Neurologically intact ABD soft Neurovascular intact Sensation intact distally Intact pulses distally Dorsiflexion/Plantar flexion intact No cellulitis present Compartment soft Incision C/D/I. House vac , no leaks detected.  JP drain site C/D/I. 5 cc SS output. Drain removed today, 1 suture left. Dry dressing placed over drain site.   Lab Results  Component Value Date   WBC 4.9 04/27/2023   HGB 11.7 (L) 04/27/2023   HCT 37.4 04/27/2023   MCV 99.2 04/27/2023   PLT 244 04/27/2023   BMET    Component Value Date/Time   NA 139 04/26/2023 0340   K 3.6 04/26/2023 0340   CL 105 04/26/2023 0340   CO2 24 04/26/2023 0340   GLUCOSE 137 (H) 04/26/2023 0340   BUN 9 04/26/2023 0340   CREATININE 0.55 04/26/2023 0340   CALCIUM 8.7 (L) 04/26/2023 0340   GFRNONAA >60 04/26/2023 0340   Results for orders placed or performed during the hospital encounter of 04/23/23  Aerobic/Anaerobic Culture w Gram Stain (surgical/deep wound)     Status: None (Preliminary result)   Collection Time: 04/25/23  3:34 PM   Specimen: Path Tissue  Result Value Ref Range Status   Specimen Description   Final    HIP RIGHT, SUBCUTANEOUS TISSUE Performed at [facility], [address] [place], [place] [address]    Special Requests   Final    NONE Performed at [facility], [address], [place], [place] [address]    Gram Stain   Final    FEW WBC PRESENT, PREDOMINANTLY PMN NO ORGANISMS SEEN Performed at [facility], [address], [place], [place] [address]    Culture   Final    RARE GRAM NEGATIVE RODS IDENTIFICATION AND SUSCEPTIBILITIES TO FOLLOW NO ANAEROBES ISOLATED; CULTURE IN PROGRESS FOR 5 DAYS    Report Status PENDING  Incomplete     Assessment/Plan: 2 Days Post-Op   Principal Problem:   Surgical site infection   WBAT with walker DVT ppx: SCDs, TEDS PO pain control Dispo: - Discontinue the JP drain when output is less than 10 cc per shift.  - Continue negative pressure dressing. Upon discharge, her house VAC suction unit will be exchanged for a portable Prevena suction unit. She will need to follow-up in the office within 7 days of discharge for removal of her negative pressure dressing.  - D/c with oral levofloxacin x2 weeks.  - Restarted BP meds due to hypertension in hospital. Patient to follow-up with PCP for BP management.    Clois Dupes, PA-C 04/27/2023, 12:17 PM   [facility]  [place] [address], [place], [place] [address] Phone: [phone] [email or web address] Facebook  [employer]

## 2023-04-27 NOTE — Progress Notes (Signed)
Discharge package printed and instructions given to pt and daughter. Verbalize understanding.

## 2023-04-27 NOTE — Progress Notes (Signed)
Switched to prevnar portable wound VAC.

## 2023-04-30 LAB — AEROBIC/ANAEROBIC CULTURE W GRAM STAIN (SURGICAL/DEEP WOUND)

## 2023-04-30 NOTE — Discharge Summary (Signed)
Physician Discharge Summary  Patient ID: Ariel Lambert MRN: [id number] DOB/AGE: [birth year] 87 y.o.  Admit date: 04/23/2023 Discharge date: 04/27/2023  Admission Diagnoses:  Surgical site infection  Discharge Diagnoses:  Principal Problem:   Surgical site infection   Past Medical History:  Diagnosis Date   Allergy    Arthritis    ASTHMA 05/03/2009   Asthma    Carotid stenosis    Chronic kidney disease    " in remission"   COPD (chronic obstructive pulmonary disease) (HCC)    CVA (cerebral infarction)    Dyslipidemia    Headache    Hearing aid worn    B/L   HYPERTENSION 04/30/2007   off of meds since  01/2023   HYPERTHYROIDISM 06/17/2007   Hyperthyroidism    Menopause    Multinodular goiter    OSTEOPOROSIS 04/30/2007   Pneumonia    Stroke [facility])    Wears glasses     Surgeries: Procedure(s): IRRIGATION AND DEBRIDEMENT HIP WOUND; APPLICATION OF NEGATIVE PRESSURE WOUND VAC on 04/25/2023   Consultants (if any):   Discharged Condition: Improved  Hospital Course: Ariel Lambert is an 87 y.o. female who was admitted 04/23/2023 with a diagnosis of Surgical site infection and went to the operating room on 04/25/2023 and underwent the above named procedures.    She was given perioperative antibiotics:  Anti-infectives (From admission, onward)    Start     Dose/Rate Route Frequency Ordered Stop   04/27/23 0000  levofloxacin (LEVAQUIN) 750 MG tablet        750 mg Oral Daily 04/27/23 1304 05/11/23 2359   04/25/23 1400  ceFAZolin (ANCEF) IVPB 2g/100 mL premix        2 g 200 mL/hr over 30 Minutes Intravenous On call to O.R. 04/25/23 1338 04/25/23 1515   04/25/23 1341  ceFAZolin (ANCEF) 2-4 GM/100ML-% IVPB       Note to Pharmacy: Myrlene Broker M: cabinet override      04/25/23 1341 04/25/23 1519   04/24/23 1700  vancomycin (VANCOREADY) IVPB 750 mg/150 mL  Status:  Discontinued        750 mg 150 mL/hr over 60 Minutes Intravenous Every 24 hours 04/23/23 1555  04/25/23 1757   04/23/23 1700  Ampicillin-Sulbactam (UNASYN) 3 g in sodium chloride 0.9 % 100 mL IVPB  Status:  Discontinued        3 g 200 mL/hr over 30 Minutes Intravenous Every 6 hours 04/23/23 1555 04/27/23 2048   04/23/23 1700  vancomycin (VANCOCIN) IVPB 1000 mg/200 mL premix        1,000 mg 200 mL/hr over 60 Minutes Intravenous  Once 04/23/23 1555 04/24/23 1731       She was given sequential compression devices, early ambulation, and aspirin for DVT prophylaxis.  POD#1 JP drain and prevena incisional vac intact.  POD#2 Patient discharged on Levoquin, and portable prevena wound vac. Patient to follow-up within 7 days of discharge for removal of incisional vac and wound check. Restarted BP meds due to hypertension in hospital. Patient to follow-up with PCP for BP management.   She benefited maximally from the hospital stay and there were no complications.    Recent vital signs:  Vitals:   04/27/23 1221 04/27/23 1358  BP: (!) 147/96 122/76  Pulse: 72 78  Resp:  16  Temp:  98.7 F (37.1 C)  SpO2:  94%    Recent laboratory studies:  Lab Results  Component Value Date   HGB 11.7 (L) 04/27/2023   HGB  11.5 (L) 04/26/2023   HGB 11.8 (L) 04/23/2023   Lab Results  Component Value Date   WBC 4.9 04/27/2023   PLT 244 04/27/2023   Lab Results  Component Value Date   INR 1.0 09/18/2021   Lab Results  Component Value Date   NA 139 04/26/2023   K 3.6 04/26/2023   CL 105 04/26/2023   CO2 24 04/26/2023   BUN 9 04/26/2023   CREATININE 0.55 04/26/2023   GLUCOSE 137 (H) 04/26/2023     Allergies as of 04/27/2023       Reactions   Cetirizine Other (See Comments)   Stomach pain   Codeine Itching   Severe itching   Hydrocodone Itching   Meperidine Hcl Diarrhea, Nausea And Vomiting   (Demerol)   Nabumetone Rash   (Relafen)   Naproxen Itching, Rash   Sulfa Antibiotics Rash   Sulfonamide Derivatives Rash        Medication List     STOP taking these medications     alendronate 70 MG tablet Commonly known as: FOSAMAX   cefadroxil 500 MG capsule Commonly known as: DURICEF       TAKE these medications    acetaminophen 325 MG tablet Commonly known as: TYLENOL Take 2 tablets (650 mg total) by mouth every 6 (six) hours as needed for fever, headache or mild pain.   albuterol 108 (90 Base) MCG/ACT inhaler Commonly known as: VENTOLIN HFA Inhale 2 puffs into the lungs every 6 (six) hours as needed. What changed: reasons to take this   Allergy Relief 10 MG tablet Generic drug: loratadine Take 10 mg by mouth daily.   aspirin EC 81 MG tablet Take 81 mg by mouth daily. Swallow whole.   azelastine 0.1 % nasal spray Commonly known as: ASTELIN Place 2 sprays into both nostrils every evening. Use in each nostril as directed   Biotin 5000 MCG Caps Take 5,000 mcg by mouth daily.   Calcium 600+D3 600-20 MG-MCG Tabs Generic drug: Calcium Carb-Cholecalciferol Take 1 tablet by mouth 2 (two) times daily.   diphenhydrAMINE 25 MG tablet Commonly known as: BENADRYL 25 mg daily as needed for allergies.   docusate sodium 100 MG capsule Commonly known as: COLACE Take 100 mg by mouth daily.   ezetimibe-simvastatin 10-20 MG tablet Commonly known as: VYTORIN Take 1 tablet by mouth daily after breakfast.   fluticasone 50 MCG/ACT nasal spray Commonly known as: FLONASE Place 2 sprays into both nostrils daily as needed for allergies or rhinitis.   hydrocortisone cream 1 % Apply 1 Application topically daily as needed (Ezema).   hydroxypropyl methylcellulose / hypromellose 2.5 % ophthalmic solution Commonly known as: ISOPTO TEARS / GONIOVISC Place 1 drop into both eyes daily.   levalbuterol 0.63 MG/3ML nebulizer solution Commonly known as: XOPENEX Take 3 mLs (0.63 mg total) by nebulization every 4 (four) hours. What changed:  when to take this reasons to take this   levofloxacin 750 MG tablet Commonly known as: Levaquin Take 1 tablet (750 mg  total) by mouth daily for 14 days.   MAGNESIUM GLUCONATE PO Take 200 mg by mouth daily.   methimazole 10 MG tablet Commonly known as: TAPAZOLE Take 5-10 mg by mouth every other day. Alternate 5 mg one day and 10 mg the next day   montelukast 10 MG tablet Commonly known as: SINGULAIR Take 10 mg by mouth at bedtime.   multivitamin with minerals Tabs tablet Take 1 tablet by mouth daily.   mupirocin ointment 2 % Commonly  known as: BACTROBAN Place 1 Application into the nose 2 (two) times daily.   ondansetron 4 MG tablet Commonly known as: ZOFRAN Take 1 tablet (4 mg total) by mouth every 6 (six) hours as needed for nausea.   polyethylene glycol 17 g packet Commonly known as: MIRALAX / GLYCOLAX Take 17 g by mouth 2 (two) times daily.   senna 8.6 MG Tabs tablet Commonly known as: SENOKOT Take 1 tablet (8.6 mg total) by mouth 2 (two) times daily.   Symbicort 160-4.5 MCG/ACT inhaler Generic drug: budesonide-formoterol Inhale 2 puffs into the lungs 2 (two) times daily.   traMADol 50 MG tablet Commonly known as: Ultram Take 1 tablet (50 mg total) by mouth every 6 (six) hours as needed for up to 7 days for severe pain.   traZODone 100 MG tablet Commonly known as: DESYREL Take 100 mg by mouth at bedtime.   triamcinolone ointment 0.1 % Commonly known as: KENALOG Apply 1 Application topically daily as needed (unknown).               Discharge Care Instructions  (From admission, onward)           Start     Ordered   04/27/23 0000  Weight bearing as tolerated        04/27/23 1304   04/27/23 0000  Change dressing       Comments: Do not remove dressing. Charge prevena wound vac nightly.  Change JP drain dressing as needed. Do not remove incisional dressing.   04/27/23 1304              WEIGHT BEARING   Weight bearing as tolerated with assist device (walker, cane, etc) as directed, use it as long as suggested by your surgeon or therapist, typically at least  4-6 weeks.   EXERCISES  Results after joint replacement surgery are often greatly improved when you follow the exercise, range of motion and muscle strengthening exercises prescribed by your doctor. Safety measures are also important to protect the joint from further injury. Any time any of these exercises cause you to have increased pain or swelling, decrease what you are doing until you are comfortable again and then slowly increase them. If you have problems or questions, call your caregiver or physical therapist for advice.   Rehabilitation is important following a joint replacement. After just a few days of immobilization, the muscles of the leg can become weakened and shrink (atrophy).  These exercises are designed to build up the tone and strength of the thigh and leg muscles and to improve motion. Often times heat used for twenty to thirty minutes before working out will loosen up your tissues and help with improving the range of motion but do not use heat for the first two weeks following surgery (sometimes heat can increase post-operative swelling).   These exercises can be done on a training (exercise) mat, on the floor, on a table or on a bed. Use whatever works the best and is most comfortable for you.    Use music or television while you are exercising so that the exercises are a pleasant break in your day. This will make your life better with the exercises acting as a break in your routine that you can look forward to.   Perform all exercises about fifteen times, three times per day or as directed.  You should exercise both the operative leg and the other leg as well.  Exercises include:   Quad Sets -  Tighten up the muscle on the front of the thigh (Quad) and hold for 5-10 seconds.   Straight Leg Raises - With your knee straight (if you were given a brace, keep it on), lift the leg to 60 degrees, hold for 3 seconds, and slowly lower the leg.  Perform this exercise against resistance  later as your leg gets stronger.  Leg Slides: Lying on your back, slowly slide your foot toward your buttocks, bending your knee up off the floor (only go as far as is comfortable). Then slowly slide your foot back down until your leg is flat on the floor again.  Angel Wings: Lying on your back spread your legs to the side as far apart as you can without causing discomfort.  Hamstring Strength:  Lying on your back, push your heel against the floor with your leg straight by tightening up the muscles of your buttocks.  Repeat, but this time bend your knee to a comfortable angle, and push your heel against the floor.  You may put a pillow under the heel to make it more comfortable if necessary.   A rehabilitation program following joint replacement surgery can speed recovery and prevent re-injury in the future due to weakened muscles. Contact your doctor or a physical therapist for more information on knee rehabilitation.    CONSTIPATION  Constipation is defined medically as fewer than three stools per week and severe constipation as less than one stool per week.  Even if you have a regular bowel pattern at home, your normal regimen is likely to be disrupted due to multiple reasons following surgery.  Combination of anesthesia, postoperative narcotics, change in appetite and fluid intake all can affect your bowels.   YOU MUST use at least one of the following options; they are listed in order of increasing strength to get the job done.  They are all available over the counter, and you may need to use some, POSSIBLY even all of these options:    Drink plenty of fluids (prune juice may be helpful) and high fiber foods Colace 100 mg by mouth twice a day  Senokot for constipation as directed and as needed Dulcolax (bisacodyl), take with full glass of water  Miralax (polyethylene glycol) once or twice a day as needed.  If you have tried all these things and are unable to have a bowel movement in the first  3-4 days after surgery call either your surgeon or your primary doctor.    If you experience loose stools or diarrhea, hold the medications until you stool forms back up.  If your symptoms do not get better within 1 week or if they get worse, check with your doctor.  If you experience "the worst abdominal pain ever" or develop nausea or vomiting, please contact the office immediately for further recommendations for treatment.   ITCHING:  If you experience itching with your medications, try taking only a single pain pill, or even half a pain pill at a time.  You can also use Benadryl over the counter for itching or also to help with sleep.   TED HOSE STOCKINGS:  Use stockings on both legs until for at least 2 weeks or as directed by physician office. They may be removed at night for sleeping.  MEDICATIONS:  See your medication summary on the "After Visit Summary" that nursing will review with you.  You may have some home medications which will be placed on hold until you complete the course of blood thinner  medication.  It is important for you to complete the blood thinner medication as prescribed.  PRECAUTIONS:  If you experience chest pain or shortness of breath - call 911 immediately for transfer to the hospital emergency department.   If you develop a fever greater that 101 F, purulent drainage from wound, increased redness or drainage from wound, foul odor from the wound/dressing, or calf pain - CONTACT YOUR SURGEON.                                                   FOLLOW-UP APPOINTMENTS:  If you do not already have a post-op appointment, please call the office for an appointment to be seen by your surgeon.  Guidelines for how soon to be seen are listed in your "After Visit Summary", but are typically between 1-4 weeks after surgery.  OTHER INSTRUCTIONS:   Knee Replacement:  Do not place pillow under knee, focus on keeping the knee straight while resting. CPM instructions: 0-90 degrees, 2  hours in the morning, 2 hours in the afternoon, and 2 hours in the evening. Place foam block, curve side up under heel at all times except when in CPM or when walking.  DO NOT modify, tear, cut, or change the foam block in any way.   MAKE SURE YOU:  Understand these instructions.  Get help right away if you are not doing well or get worse.    Thank you for letting us be a part of your medical care team.  It is a privilege we respect greatly.  We hope these instructions will help you stay on track for a fast and full recovery!   Diagnostic Studies: No results found.  Disposition: Discharge disposition: 01-Home or Self Care       Discharge Instructions     Call MD / Call 911   Complete by: As directed    If you experience chest pain or shortness of breath, CALL 911 and be transported to the hospital emergency room.  If you develope a fever above 101 F, pus (white drainage) or increased drainage or redness at the wound, or calf pain, call your surgeon's office.   Change dressing   Complete by: As directed    Do not remove dressing. Charge prevena wound vac nightly.  Change JP drain dressing as needed. Do not remove incisional dressing.   Constipation Prevention   Complete by: As directed    Drink plenty of fluids.  Prune juice may be helpful.  You may use a stool softener, such as Colace (over the counter) 100 mg twice a day.  Use MiraLax (over the counter) for constipation as needed.   Diet - low sodium heart healthy   Complete by: As directed    Discharge instructions   Complete by: As directed    Elevate toes above nose. Use cryotherapy as needed for pain and swelling.   Driving restrictions   Complete by: As directed    No driving for 6 weeks   Increase activity slowly as tolerated   Complete by: As directed    Lifting restrictions   Complete by: As directed    No lifting for 6 weeks   Post-operative opioid taper instructions:   Complete by: As directed     POST-OPERATIVE OPIOID TAPER INSTRUCTIONS: It is important to wean off of your opioid medication  as soon as possible. If you do not need pain medication after your surgery it is ok to stop day one. Opioids include: Codeine, Hydrocodone(Norco, Vicodin), Oxycodone(Percocet, oxycontin) and hydromorphone amongst others.  Long term and even short term use of opiods can cause: Increased pain response Dependence Constipation Depression Respiratory depression And more.  Withdrawal symptoms can include Flu like symptoms Nausea, vomiting And more Techniques to manage these symptoms Hydrate well Eat regular healthy meals Stay active Use relaxation techniques(deep breathing, meditating, yoga) Do Not substitute Alcohol to help with tapering If you have been on opioids for less than two weeks and do not have pain than it is ok to stop all together.  Plan to wean off of opioids This plan should start within one week post op of your joint replacement. Maintain the same interval or time between taking each dose and first decrease the dose.  Cut the total daily intake of opioids by one tablet each day Next start to increase the time between doses. The last dose that should be eliminated is the evening dose.      TED hose   Complete by: As directed    Use stockings (TED hose) for 2 weeks on both leg(s).  You may remove them at night for sleeping.   Weight bearing as tolerated   Complete by: As directed         Follow-up Information     Clois Dupes, PA-C Follow up in 7 day(s).   Specialty: Orthopedic Surgery Why: Follow-up within 7 days of discharge for prevena negative dressing removal and wound check. Contact information: [address] [place] [address] [phone]                  Signed: Clois Dupes 04/30/2023, 7:51 AM

## 2023-05-04 ENCOUNTER — Telehealth: Payer: Self-pay | Admitting: "Endocrinology

## 2023-05-04 MED ORDER — METHIMAZOLE 10 MG PO TABS: ORAL_TABLET | ORAL | 1 refills | Status: DC

## 2023-05-04 NOTE — Telephone Encounter (Signed)
Patient advising she need s a refill on her Methimazol. Send to [facility] on [address]

## 2023-05-04 NOTE — Telephone Encounter (Signed)
Done

## 2023-05-09 DIAGNOSIS — Z96641 Presence of right artificial hip joint: Secondary | ICD-10-CM | POA: Diagnosis not present

## 2023-05-09 DIAGNOSIS — I700 Atherosclerosis of aorta: Secondary | ICD-10-CM | POA: Diagnosis not present

## 2023-05-09 DIAGNOSIS — I10 Essential (primary) hypertension: Secondary | ICD-10-CM | POA: Diagnosis not present

## 2023-05-09 DIAGNOSIS — E785 Hyperlipidemia, unspecified: Secondary | ICD-10-CM | POA: Diagnosis not present

## 2023-05-09 DIAGNOSIS — J4489 Other specified chronic obstructive pulmonary disease: Secondary | ICD-10-CM | POA: Diagnosis not present

## 2023-05-09 DIAGNOSIS — I2510 Atherosclerotic heart disease of native coronary artery without angina pectoris: Secondary | ICD-10-CM | POA: Diagnosis not present

## 2023-05-09 DIAGNOSIS — M80051D Age-related osteoporosis with current pathological fracture, right femur, subsequent encounter for fracture with routine healing: Secondary | ICD-10-CM | POA: Diagnosis not present

## 2023-05-09 DIAGNOSIS — I471 Supraventricular tachycardia, unspecified: Secondary | ICD-10-CM | POA: Diagnosis not present

## 2023-05-09 DIAGNOSIS — T8149XA Infection following a procedure, other surgical site, initial encounter: Secondary | ICD-10-CM | POA: Diagnosis not present

## 2023-05-12 DIAGNOSIS — T8149XA Infection following a procedure, other surgical site, initial encounter: Secondary | ICD-10-CM | POA: Diagnosis not present

## 2023-05-12 DIAGNOSIS — I700 Atherosclerosis of aorta: Secondary | ICD-10-CM | POA: Diagnosis not present

## 2023-05-12 DIAGNOSIS — E785 Hyperlipidemia, unspecified: Secondary | ICD-10-CM | POA: Diagnosis not present

## 2023-05-12 DIAGNOSIS — J4489 Other specified chronic obstructive pulmonary disease: Secondary | ICD-10-CM | POA: Diagnosis not present

## 2023-05-12 DIAGNOSIS — I10 Essential (primary) hypertension: Secondary | ICD-10-CM | POA: Diagnosis not present

## 2023-05-12 DIAGNOSIS — Z96641 Presence of right artificial hip joint: Secondary | ICD-10-CM | POA: Diagnosis not present

## 2023-05-12 DIAGNOSIS — I2510 Atherosclerotic heart disease of native coronary artery without angina pectoris: Secondary | ICD-10-CM | POA: Diagnosis not present

## 2023-05-12 DIAGNOSIS — M80051D Age-related osteoporosis with current pathological fracture, right femur, subsequent encounter for fracture with routine healing: Secondary | ICD-10-CM | POA: Diagnosis not present

## 2023-05-12 DIAGNOSIS — I471 Supraventricular tachycardia, unspecified: Secondary | ICD-10-CM | POA: Diagnosis not present

## 2023-05-13 DIAGNOSIS — M80051D Age-related osteoporosis with current pathological fracture, right femur, subsequent encounter for fracture with routine healing: Secondary | ICD-10-CM | POA: Diagnosis not present

## 2023-05-13 DIAGNOSIS — Z96641 Presence of right artificial hip joint: Secondary | ICD-10-CM | POA: Diagnosis not present

## 2023-05-13 DIAGNOSIS — E0590 Thyrotoxicosis, unspecified without thyrotoxic crisis or storm: Secondary | ICD-10-CM | POA: Diagnosis not present

## 2023-05-13 DIAGNOSIS — I10 Essential (primary) hypertension: Secondary | ICD-10-CM | POA: Diagnosis not present

## 2023-05-13 DIAGNOSIS — H9193 Unspecified hearing loss, bilateral: Secondary | ICD-10-CM | POA: Diagnosis not present

## 2023-05-13 DIAGNOSIS — M810 Age-related osteoporosis without current pathological fracture: Secondary | ICD-10-CM | POA: Diagnosis not present

## 2023-05-13 DIAGNOSIS — K5900 Constipation, unspecified: Secondary | ICD-10-CM | POA: Diagnosis not present

## 2023-05-13 DIAGNOSIS — Z8673 Personal history of transient ischemic attack (TIA), and cerebral infarction without residual deficits: Secondary | ICD-10-CM | POA: Diagnosis not present

## 2023-05-13 DIAGNOSIS — R32 Unspecified urinary incontinence: Secondary | ICD-10-CM | POA: Diagnosis not present

## 2023-05-13 DIAGNOSIS — Z7951 Long term (current) use of inhaled steroids: Secondary | ICD-10-CM | POA: Diagnosis not present

## 2023-05-13 DIAGNOSIS — I2510 Atherosclerotic heart disease of native coronary artery without angina pectoris: Secondary | ICD-10-CM | POA: Diagnosis not present

## 2023-05-13 DIAGNOSIS — M1990 Unspecified osteoarthritis, unspecified site: Secondary | ICD-10-CM | POA: Diagnosis not present

## 2023-05-13 DIAGNOSIS — G319 Degenerative disease of nervous system, unspecified: Secondary | ICD-10-CM | POA: Diagnosis not present

## 2023-05-13 DIAGNOSIS — I700 Atherosclerosis of aorta: Secondary | ICD-10-CM | POA: Diagnosis not present

## 2023-05-13 DIAGNOSIS — Z9181 History of falling: Secondary | ICD-10-CM | POA: Diagnosis not present

## 2023-05-13 DIAGNOSIS — T8140XD Infection following a procedure, unspecified, subsequent encounter: Secondary | ICD-10-CM | POA: Diagnosis not present

## 2023-05-13 DIAGNOSIS — E785 Hyperlipidemia, unspecified: Secondary | ICD-10-CM | POA: Diagnosis not present

## 2023-05-13 DIAGNOSIS — Z818 Family history of other mental and behavioral disorders: Secondary | ICD-10-CM | POA: Diagnosis not present

## 2023-05-13 DIAGNOSIS — T8149XA Infection following a procedure, other surgical site, initial encounter: Secondary | ICD-10-CM | POA: Diagnosis not present

## 2023-05-13 DIAGNOSIS — Z8249 Family history of ischemic heart disease and other diseases of the circulatory system: Secondary | ICD-10-CM | POA: Diagnosis not present

## 2023-05-13 DIAGNOSIS — G4700 Insomnia, unspecified: Secondary | ICD-10-CM | POA: Diagnosis not present

## 2023-05-13 DIAGNOSIS — I471 Supraventricular tachycardia, unspecified: Secondary | ICD-10-CM | POA: Diagnosis not present

## 2023-05-13 DIAGNOSIS — J4489 Other specified chronic obstructive pulmonary disease: Secondary | ICD-10-CM | POA: Diagnosis not present

## 2023-05-13 DIAGNOSIS — J301 Allergic rhinitis due to pollen: Secondary | ICD-10-CM | POA: Diagnosis not present

## 2023-05-26 DIAGNOSIS — I2510 Atherosclerotic heart disease of native coronary artery without angina pectoris: Secondary | ICD-10-CM | POA: Diagnosis not present

## 2023-05-26 DIAGNOSIS — T8149XS Infection following a procedure, other surgical site, sequela: Secondary | ICD-10-CM | POA: Diagnosis not present

## 2023-05-26 DIAGNOSIS — H5713 Ocular pain, bilateral: Secondary | ICD-10-CM | POA: Diagnosis not present

## 2023-05-26 DIAGNOSIS — E785 Hyperlipidemia, unspecified: Secondary | ICD-10-CM | POA: Diagnosis not present

## 2023-05-26 DIAGNOSIS — I471 Supraventricular tachycardia, unspecified: Secondary | ICD-10-CM | POA: Diagnosis not present

## 2023-05-26 DIAGNOSIS — I10 Essential (primary) hypertension: Secondary | ICD-10-CM | POA: Diagnosis not present

## 2023-05-26 DIAGNOSIS — Z79899 Other long term (current) drug therapy: Secondary | ICD-10-CM | POA: Diagnosis not present

## 2023-05-26 DIAGNOSIS — I700 Atherosclerosis of aorta: Secondary | ICD-10-CM | POA: Diagnosis not present

## 2023-05-26 DIAGNOSIS — T8149XA Infection following a procedure, other surgical site, initial encounter: Secondary | ICD-10-CM | POA: Diagnosis not present

## 2023-05-26 DIAGNOSIS — M80051D Age-related osteoporosis with current pathological fracture, right femur, subsequent encounter for fracture with routine healing: Secondary | ICD-10-CM | POA: Diagnosis not present

## 2023-05-26 DIAGNOSIS — Z96641 Presence of right artificial hip joint: Secondary | ICD-10-CM | POA: Diagnosis not present

## 2023-05-26 DIAGNOSIS — J4489 Other specified chronic obstructive pulmonary disease: Secondary | ICD-10-CM | POA: Diagnosis not present

## 2023-05-26 DIAGNOSIS — S71001D Unspecified open wound, right hip, subsequent encounter: Secondary | ICD-10-CM | POA: Diagnosis not present

## 2023-05-26 DIAGNOSIS — Z23 Encounter for immunization: Secondary | ICD-10-CM | POA: Diagnosis not present

## 2023-05-27 DIAGNOSIS — H04123 Dry eye syndrome of bilateral lacrimal glands: Secondary | ICD-10-CM | POA: Diagnosis not present

## 2023-05-27 DIAGNOSIS — H10413 Chronic giant papillary conjunctivitis, bilateral: Secondary | ICD-10-CM | POA: Diagnosis not present

## 2023-05-28 DIAGNOSIS — E785 Hyperlipidemia, unspecified: Secondary | ICD-10-CM | POA: Diagnosis not present

## 2023-05-28 DIAGNOSIS — Z96641 Presence of right artificial hip joint: Secondary | ICD-10-CM | POA: Diagnosis not present

## 2023-05-28 DIAGNOSIS — I10 Essential (primary) hypertension: Secondary | ICD-10-CM | POA: Diagnosis not present

## 2023-05-28 DIAGNOSIS — I700 Atherosclerosis of aorta: Secondary | ICD-10-CM | POA: Diagnosis not present

## 2023-05-28 DIAGNOSIS — I2510 Atherosclerotic heart disease of native coronary artery without angina pectoris: Secondary | ICD-10-CM | POA: Diagnosis not present

## 2023-05-28 DIAGNOSIS — I471 Supraventricular tachycardia, unspecified: Secondary | ICD-10-CM | POA: Diagnosis not present

## 2023-05-28 DIAGNOSIS — J4489 Other specified chronic obstructive pulmonary disease: Secondary | ICD-10-CM | POA: Diagnosis not present

## 2023-05-28 DIAGNOSIS — M80051D Age-related osteoporosis with current pathological fracture, right femur, subsequent encounter for fracture with routine healing: Secondary | ICD-10-CM | POA: Diagnosis not present

## 2023-05-28 DIAGNOSIS — T8149XA Infection following a procedure, other surgical site, initial encounter: Secondary | ICD-10-CM | POA: Diagnosis not present

## 2023-06-01 DIAGNOSIS — I700 Atherosclerosis of aorta: Secondary | ICD-10-CM | POA: Diagnosis not present

## 2023-06-01 DIAGNOSIS — T8149XA Infection following a procedure, other surgical site, initial encounter: Secondary | ICD-10-CM | POA: Diagnosis not present

## 2023-06-01 DIAGNOSIS — Z96641 Presence of right artificial hip joint: Secondary | ICD-10-CM | POA: Diagnosis not present

## 2023-06-01 DIAGNOSIS — M80051D Age-related osteoporosis with current pathological fracture, right femur, subsequent encounter for fracture with routine healing: Secondary | ICD-10-CM | POA: Diagnosis not present

## 2023-06-01 DIAGNOSIS — I471 Supraventricular tachycardia, unspecified: Secondary | ICD-10-CM | POA: Diagnosis not present

## 2023-06-01 DIAGNOSIS — I2510 Atherosclerotic heart disease of native coronary artery without angina pectoris: Secondary | ICD-10-CM | POA: Diagnosis not present

## 2023-06-01 DIAGNOSIS — E785 Hyperlipidemia, unspecified: Secondary | ICD-10-CM | POA: Diagnosis not present

## 2023-06-01 DIAGNOSIS — I10 Essential (primary) hypertension: Secondary | ICD-10-CM | POA: Diagnosis not present

## 2023-06-01 DIAGNOSIS — J4489 Other specified chronic obstructive pulmonary disease: Secondary | ICD-10-CM | POA: Diagnosis not present

## 2023-06-09 DIAGNOSIS — Z96641 Presence of right artificial hip joint: Secondary | ICD-10-CM | POA: Diagnosis not present

## 2023-06-09 DIAGNOSIS — T8149XA Infection following a procedure, other surgical site, initial encounter: Secondary | ICD-10-CM | POA: Diagnosis not present

## 2023-06-09 DIAGNOSIS — M80051D Age-related osteoporosis with current pathological fracture, right femur, subsequent encounter for fracture with routine healing: Secondary | ICD-10-CM | POA: Diagnosis not present

## 2023-06-09 DIAGNOSIS — E785 Hyperlipidemia, unspecified: Secondary | ICD-10-CM | POA: Diagnosis not present

## 2023-06-09 DIAGNOSIS — I471 Supraventricular tachycardia, unspecified: Secondary | ICD-10-CM | POA: Diagnosis not present

## 2023-06-09 DIAGNOSIS — I700 Atherosclerosis of aorta: Secondary | ICD-10-CM | POA: Diagnosis not present

## 2023-06-09 DIAGNOSIS — I2510 Atherosclerotic heart disease of native coronary artery without angina pectoris: Secondary | ICD-10-CM | POA: Diagnosis not present

## 2023-06-09 DIAGNOSIS — I10 Essential (primary) hypertension: Secondary | ICD-10-CM | POA: Diagnosis not present

## 2023-06-09 DIAGNOSIS — J4489 Other specified chronic obstructive pulmonary disease: Secondary | ICD-10-CM | POA: Diagnosis not present

## 2023-06-19 DIAGNOSIS — I700 Atherosclerosis of aorta: Secondary | ICD-10-CM | POA: Diagnosis not present

## 2023-06-19 DIAGNOSIS — T8149XA Infection following a procedure, other surgical site, initial encounter: Secondary | ICD-10-CM | POA: Diagnosis not present

## 2023-06-19 DIAGNOSIS — I10 Essential (primary) hypertension: Secondary | ICD-10-CM | POA: Diagnosis not present

## 2023-06-19 DIAGNOSIS — E785 Hyperlipidemia, unspecified: Secondary | ICD-10-CM | POA: Diagnosis not present

## 2023-06-19 DIAGNOSIS — I471 Supraventricular tachycardia, unspecified: Secondary | ICD-10-CM | POA: Diagnosis not present

## 2023-06-19 DIAGNOSIS — I2510 Atherosclerotic heart disease of native coronary artery without angina pectoris: Secondary | ICD-10-CM | POA: Diagnosis not present

## 2023-06-19 DIAGNOSIS — M80051D Age-related osteoporosis with current pathological fracture, right femur, subsequent encounter for fracture with routine healing: Secondary | ICD-10-CM | POA: Diagnosis not present

## 2023-06-19 DIAGNOSIS — J4489 Other specified chronic obstructive pulmonary disease: Secondary | ICD-10-CM | POA: Diagnosis not present

## 2023-06-19 DIAGNOSIS — Z96641 Presence of right artificial hip joint: Secondary | ICD-10-CM | POA: Diagnosis not present

## 2023-06-23 ENCOUNTER — Other Ambulatory Visit: Payer: Self-pay | Admitting: Family Medicine

## 2023-06-23 DIAGNOSIS — T8149XA Infection following a procedure, other surgical site, initial encounter: Secondary | ICD-10-CM | POA: Diagnosis not present

## 2023-06-23 DIAGNOSIS — Z1231 Encounter for screening mammogram for malignant neoplasm of breast: Secondary | ICD-10-CM

## 2023-06-23 DIAGNOSIS — I700 Atherosclerosis of aorta: Secondary | ICD-10-CM | POA: Diagnosis not present

## 2023-06-23 DIAGNOSIS — I471 Supraventricular tachycardia, unspecified: Secondary | ICD-10-CM | POA: Diagnosis not present

## 2023-06-23 DIAGNOSIS — S72031D Displaced midcervical fracture of right femur, subsequent encounter for closed fracture with routine healing: Secondary | ICD-10-CM | POA: Diagnosis not present

## 2023-06-23 DIAGNOSIS — I2510 Atherosclerotic heart disease of native coronary artery without angina pectoris: Secondary | ICD-10-CM | POA: Diagnosis not present

## 2023-06-23 DIAGNOSIS — J4489 Other specified chronic obstructive pulmonary disease: Secondary | ICD-10-CM | POA: Diagnosis not present

## 2023-06-23 DIAGNOSIS — I10 Essential (primary) hypertension: Secondary | ICD-10-CM | POA: Diagnosis not present

## 2023-06-23 DIAGNOSIS — M80051D Age-related osteoporosis with current pathological fracture, right femur, subsequent encounter for fracture with routine healing: Secondary | ICD-10-CM | POA: Diagnosis not present

## 2023-06-23 DIAGNOSIS — E785 Hyperlipidemia, unspecified: Secondary | ICD-10-CM | POA: Diagnosis not present

## 2023-06-23 DIAGNOSIS — Z96641 Presence of right artificial hip joint: Secondary | ICD-10-CM | POA: Diagnosis not present

## 2023-06-24 DIAGNOSIS — H0100B Unspecified blepharitis left eye, upper and lower eyelids: Secondary | ICD-10-CM | POA: Diagnosis not present

## 2023-06-24 DIAGNOSIS — H0100A Unspecified blepharitis right eye, upper and lower eyelids: Secondary | ICD-10-CM | POA: Diagnosis not present

## 2023-06-24 DIAGNOSIS — H04123 Dry eye syndrome of bilateral lacrimal glands: Secondary | ICD-10-CM | POA: Diagnosis not present

## 2023-07-03 DIAGNOSIS — Z96641 Presence of right artificial hip joint: Secondary | ICD-10-CM | POA: Diagnosis not present

## 2023-07-03 DIAGNOSIS — E785 Hyperlipidemia, unspecified: Secondary | ICD-10-CM | POA: Diagnosis not present

## 2023-07-03 DIAGNOSIS — M80051D Age-related osteoporosis with current pathological fracture, right femur, subsequent encounter for fracture with routine healing: Secondary | ICD-10-CM | POA: Diagnosis not present

## 2023-07-03 DIAGNOSIS — T8149XA Infection following a procedure, other surgical site, initial encounter: Secondary | ICD-10-CM | POA: Diagnosis not present

## 2023-07-03 DIAGNOSIS — J4489 Other specified chronic obstructive pulmonary disease: Secondary | ICD-10-CM | POA: Diagnosis not present

## 2023-07-03 DIAGNOSIS — I471 Supraventricular tachycardia, unspecified: Secondary | ICD-10-CM | POA: Diagnosis not present

## 2023-07-03 DIAGNOSIS — I10 Essential (primary) hypertension: Secondary | ICD-10-CM | POA: Diagnosis not present

## 2023-07-03 DIAGNOSIS — I700 Atherosclerosis of aorta: Secondary | ICD-10-CM | POA: Diagnosis not present

## 2023-07-03 DIAGNOSIS — I2510 Atherosclerotic heart disease of native coronary artery without angina pectoris: Secondary | ICD-10-CM | POA: Diagnosis not present

## 2023-07-06 ENCOUNTER — Ambulatory Visit: Payer: Medicare PPO | Admitting: "Endocrinology

## 2023-07-06 DIAGNOSIS — S72031D Displaced midcervical fracture of right femur, subsequent encounter for closed fracture with routine healing: Secondary | ICD-10-CM | POA: Diagnosis not present

## 2023-07-07 DIAGNOSIS — I10 Essential (primary) hypertension: Secondary | ICD-10-CM | POA: Diagnosis not present

## 2023-07-07 DIAGNOSIS — I700 Atherosclerosis of aorta: Secondary | ICD-10-CM | POA: Diagnosis not present

## 2023-07-07 DIAGNOSIS — I471 Supraventricular tachycardia, unspecified: Secondary | ICD-10-CM | POA: Diagnosis not present

## 2023-07-07 DIAGNOSIS — E785 Hyperlipidemia, unspecified: Secondary | ICD-10-CM | POA: Diagnosis not present

## 2023-07-07 DIAGNOSIS — Z96641 Presence of right artificial hip joint: Secondary | ICD-10-CM | POA: Diagnosis not present

## 2023-07-07 DIAGNOSIS — I2510 Atherosclerotic heart disease of native coronary artery without angina pectoris: Secondary | ICD-10-CM | POA: Diagnosis not present

## 2023-07-07 DIAGNOSIS — T8149XA Infection following a procedure, other surgical site, initial encounter: Secondary | ICD-10-CM | POA: Diagnosis not present

## 2023-07-07 DIAGNOSIS — M80051D Age-related osteoporosis with current pathological fracture, right femur, subsequent encounter for fracture with routine healing: Secondary | ICD-10-CM | POA: Diagnosis not present

## 2023-07-07 DIAGNOSIS — J4489 Other specified chronic obstructive pulmonary disease: Secondary | ICD-10-CM | POA: Diagnosis not present

## 2023-07-08 DIAGNOSIS — I10 Essential (primary) hypertension: Secondary | ICD-10-CM | POA: Diagnosis not present

## 2023-07-08 DIAGNOSIS — E785 Hyperlipidemia, unspecified: Secondary | ICD-10-CM | POA: Diagnosis not present

## 2023-07-08 DIAGNOSIS — M80051D Age-related osteoporosis with current pathological fracture, right femur, subsequent encounter for fracture with routine healing: Secondary | ICD-10-CM | POA: Diagnosis not present

## 2023-07-08 DIAGNOSIS — I2510 Atherosclerotic heart disease of native coronary artery without angina pectoris: Secondary | ICD-10-CM | POA: Diagnosis not present

## 2023-07-08 DIAGNOSIS — I471 Supraventricular tachycardia, unspecified: Secondary | ICD-10-CM | POA: Diagnosis not present

## 2023-07-08 DIAGNOSIS — Z96641 Presence of right artificial hip joint: Secondary | ICD-10-CM | POA: Diagnosis not present

## 2023-07-08 DIAGNOSIS — J4489 Other specified chronic obstructive pulmonary disease: Secondary | ICD-10-CM | POA: Diagnosis not present

## 2023-07-08 DIAGNOSIS — T8149XA Infection following a procedure, other surgical site, initial encounter: Secondary | ICD-10-CM | POA: Diagnosis not present

## 2023-07-08 DIAGNOSIS — I700 Atherosclerosis of aorta: Secondary | ICD-10-CM | POA: Diagnosis not present

## 2023-07-10 DIAGNOSIS — I471 Supraventricular tachycardia, unspecified: Secondary | ICD-10-CM | POA: Diagnosis not present

## 2023-07-10 DIAGNOSIS — I700 Atherosclerosis of aorta: Secondary | ICD-10-CM | POA: Diagnosis not present

## 2023-07-10 DIAGNOSIS — I2510 Atherosclerotic heart disease of native coronary artery without angina pectoris: Secondary | ICD-10-CM | POA: Diagnosis not present

## 2023-07-10 DIAGNOSIS — E785 Hyperlipidemia, unspecified: Secondary | ICD-10-CM | POA: Diagnosis not present

## 2023-07-10 DIAGNOSIS — T8149XA Infection following a procedure, other surgical site, initial encounter: Secondary | ICD-10-CM | POA: Diagnosis not present

## 2023-07-10 DIAGNOSIS — J4489 Other specified chronic obstructive pulmonary disease: Secondary | ICD-10-CM | POA: Diagnosis not present

## 2023-07-10 DIAGNOSIS — M80051D Age-related osteoporosis with current pathological fracture, right femur, subsequent encounter for fracture with routine healing: Secondary | ICD-10-CM | POA: Diagnosis not present

## 2023-07-10 DIAGNOSIS — Z96641 Presence of right artificial hip joint: Secondary | ICD-10-CM | POA: Diagnosis not present

## 2023-07-10 DIAGNOSIS — I10 Essential (primary) hypertension: Secondary | ICD-10-CM | POA: Diagnosis not present

## 2023-07-13 DIAGNOSIS — I2510 Atherosclerotic heart disease of native coronary artery without angina pectoris: Secondary | ICD-10-CM | POA: Diagnosis not present

## 2023-07-13 DIAGNOSIS — T8149XA Infection following a procedure, other surgical site, initial encounter: Secondary | ICD-10-CM | POA: Diagnosis not present

## 2023-07-13 DIAGNOSIS — M80051D Age-related osteoporosis with current pathological fracture, right femur, subsequent encounter for fracture with routine healing: Secondary | ICD-10-CM | POA: Diagnosis not present

## 2023-07-13 DIAGNOSIS — E785 Hyperlipidemia, unspecified: Secondary | ICD-10-CM | POA: Diagnosis not present

## 2023-07-13 DIAGNOSIS — I700 Atherosclerosis of aorta: Secondary | ICD-10-CM | POA: Diagnosis not present

## 2023-07-13 DIAGNOSIS — I471 Supraventricular tachycardia, unspecified: Secondary | ICD-10-CM | POA: Diagnosis not present

## 2023-07-13 DIAGNOSIS — Z96641 Presence of right artificial hip joint: Secondary | ICD-10-CM | POA: Diagnosis not present

## 2023-07-13 DIAGNOSIS — I10 Essential (primary) hypertension: Secondary | ICD-10-CM | POA: Diagnosis not present

## 2023-07-13 DIAGNOSIS — J4489 Other specified chronic obstructive pulmonary disease: Secondary | ICD-10-CM | POA: Diagnosis not present

## 2023-07-14 DIAGNOSIS — M80051D Age-related osteoporosis with current pathological fracture, right femur, subsequent encounter for fracture with routine healing: Secondary | ICD-10-CM | POA: Diagnosis not present

## 2023-07-14 DIAGNOSIS — I471 Supraventricular tachycardia, unspecified: Secondary | ICD-10-CM | POA: Diagnosis not present

## 2023-07-14 DIAGNOSIS — I10 Essential (primary) hypertension: Secondary | ICD-10-CM | POA: Diagnosis not present

## 2023-07-14 DIAGNOSIS — I700 Atherosclerosis of aorta: Secondary | ICD-10-CM | POA: Diagnosis not present

## 2023-07-14 DIAGNOSIS — T8149XA Infection following a procedure, other surgical site, initial encounter: Secondary | ICD-10-CM | POA: Diagnosis not present

## 2023-07-14 DIAGNOSIS — I2510 Atherosclerotic heart disease of native coronary artery without angina pectoris: Secondary | ICD-10-CM | POA: Diagnosis not present

## 2023-07-14 DIAGNOSIS — Z96641 Presence of right artificial hip joint: Secondary | ICD-10-CM | POA: Diagnosis not present

## 2023-07-14 DIAGNOSIS — J4489 Other specified chronic obstructive pulmonary disease: Secondary | ICD-10-CM | POA: Diagnosis not present

## 2023-07-14 DIAGNOSIS — E785 Hyperlipidemia, unspecified: Secondary | ICD-10-CM | POA: Diagnosis not present

## 2023-07-15 ENCOUNTER — Encounter: Payer: Self-pay | Admitting: "Endocrinology

## 2023-07-15 ENCOUNTER — Ambulatory Visit: Payer: Medicare PPO | Admitting: "Endocrinology

## 2023-07-15 VITALS — BP 160/102 | HR 80 | Ht 62.0 in | Wt 124.6 lb

## 2023-07-15 DIAGNOSIS — I10 Essential (primary) hypertension: Secondary | ICD-10-CM | POA: Diagnosis not present

## 2023-07-15 DIAGNOSIS — M80051D Age-related osteoporosis with current pathological fracture, right femur, subsequent encounter for fracture with routine healing: Secondary | ICD-10-CM | POA: Diagnosis not present

## 2023-07-15 DIAGNOSIS — I700 Atherosclerosis of aorta: Secondary | ICD-10-CM | POA: Diagnosis not present

## 2023-07-15 DIAGNOSIS — I471 Supraventricular tachycardia, unspecified: Secondary | ICD-10-CM | POA: Diagnosis not present

## 2023-07-15 DIAGNOSIS — J4489 Other specified chronic obstructive pulmonary disease: Secondary | ICD-10-CM | POA: Diagnosis not present

## 2023-07-15 DIAGNOSIS — T8149XA Infection following a procedure, other surgical site, initial encounter: Secondary | ICD-10-CM | POA: Diagnosis not present

## 2023-07-15 DIAGNOSIS — I2510 Atherosclerotic heart disease of native coronary artery without angina pectoris: Secondary | ICD-10-CM | POA: Diagnosis not present

## 2023-07-15 DIAGNOSIS — E0590 Thyrotoxicosis, unspecified without thyrotoxic crisis or storm: Secondary | ICD-10-CM

## 2023-07-15 DIAGNOSIS — E042 Nontoxic multinodular goiter: Secondary | ICD-10-CM | POA: Diagnosis not present

## 2023-07-15 DIAGNOSIS — E785 Hyperlipidemia, unspecified: Secondary | ICD-10-CM | POA: Diagnosis not present

## 2023-07-15 DIAGNOSIS — Z96641 Presence of right artificial hip joint: Secondary | ICD-10-CM | POA: Diagnosis not present

## 2023-07-15 LAB — T4, FREE: Free T4: 0.93 ng/dL (ref 0.60–1.60)

## 2023-07-15 LAB — T3, FREE: T3, Free: 3.2 pg/mL (ref 2.3–4.2)

## 2023-07-15 LAB — TSH: TSH: 1.93 u[IU]/mL (ref 0.35–5.50)

## 2023-07-15 NOTE — Progress Notes (Signed)
Outpatient Endocrinology Note   Ariel Lambert 1936/01/17 [id number]  Referring Provider: Mila Palmer, MD Primary Care Provider: Mila Palmer, MD Subjective  No chief complaint on file.   Assessment & Plan  Diagnoses and all orders for this visit:  Hyperthyroidism -     TRAb (TSH Receptor Binding Antibody); Future -     TSH; Future -     T4, free; Future -     T3, free; Future -     T3, free -     T4, free -     TSH -     TRAb (TSH Receptor Binding Antibody) -     T4, free; Future -     T3, free; Future -     TSH; Future  Multinodular goiter -     US THYROID; Future -     US THYROID     Ariel Lambert is currently alternating between methimazole 10 mg and 5 mg. Patient currently clinically and biochemically euthyroid.  Educated on thyroid axis.  Educated on thyroid axis.  Recommend the following: continue current dose   Repeat labs in 3 months or sooner if symptoms of hyper or hypothyroidism develop.  Counseled on: -complications of untreated hyperthyroidism including atrial fibrillation, heart failure and osteoporosis -side effects of Methimazole including but not limited to allergic reaction, rash, bone marrow suppression, liver dysfunction and teratogenic potential -to go urgent care if develops any fever and sore throat or any other complication for Methimazole and notify us -compliance and follow up needs    06/2019 thy ultrasound reported multinodular goiter, an indeterminate lesion or mass in the lateral right neck that measures up to 3.3 cm. This lesion appears to be separate from the thyroid tissue. Structure is indeterminate and recommend further characterization with a neck CT with IV contrast. Nodule #1 in the isthmus meets criteria for 1 year follow-up. Nodule #8 in the inferior left thyroid lobe meets criteria for follow-up but this is likely benign based on the images from 2005  07/2019 CT neck W contrast reported  enlargement of the thyroid bilaterally. Numerous thyroid nodules bilaterally. 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  I spent more than 50% of today's visit counseling patient on symptoms, examination findings, lab findings, imaging results, treatment decisions and monitoring and prognosis. The patient understood the recommendations and agrees with the treatment plan. All questions regarding treatment plan were fully answered   Return in about 4 months (around 11/13/2023) for visit + labs before next visit.   Ariel Lambert English, MD    I have reviewed current medications, nurse's notes, allergies, vital signs, past medical and surgical history, family medical history, and social history for this encounter. Counseled patient on symptoms, examination findings, lab findings, imaging results, treatment decisions and monitoring and prognosis. The patient understood the recommendations and agrees with the treatment plan. All questions regarding treatment plan were fully answered.   History of Present Illness Ariel Lambert is a 87 y.o. year old female who presents to our clinic with hyperthyroidism  and multinodular goiter diagnosed in 1999.  Alternates between methimazole 10 mg and 5 mg.  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 in early 2021."  Adverse Drug Effects from Methimazole (MMI): rash no fever no throat pain no arthritis no mouth ulcers no jaundice no loss of appetite no lymphadenopathy no  Grave's Ophthalmopathy Clinical Activity Score: 0/9  Symptoms suggestive of HYPOTHYROIDISM:  fatigue - weight gain - cold intolerance  - constipation  -  Symptoms suggestive of HYPERTHYROIDISM:  weight loss  - heat intolerance - hyperdefecation  - palpitations  -  Compressive symptoms:  dysphagia  - dysphonia  - positional dyspnea (especially with simultaneous arms elevation)  -  Smokes  - On biotin  + Personal history of head/neck surgery/irradiation  -  Physical Exam  BP (!) 160/102   Pulse 80   Ht 5\' 2"  (1.575 m)   Wt 124 lb 9.6 oz (56.5 kg)   SpO2 98%   BMI 22.79 kg/m  Constitutional: well developed, well nourished Head: normocephalic, atraumatic, no exophthalmos Eyes: sclera anicteric, no redness Neck: no thyromegaly, no thyroid tenderness; + nodules palpated Lungs: normal respiratory effort Neurology: alert and oriented, no tremor Skin: dry, no appreciable rashes Musculoskeletal: no appreciable defects Psychiatric: normal mood and affect  Allergies Allergies  Allergen Reactions   Cetirizine Other (See Comments)    Stomach pain   Codeine Itching    Severe itching   Hydrocodone Itching   Meperidine Hcl Diarrhea and Nausea And Vomiting    (Demerol)   Nabumetone Rash    (Relafen)   Naproxen Itching and Rash   Sulfa Antibiotics Rash   Sulfonamide Derivatives Rash    Current Medications Patient's Medications  New Prescriptions   No medications on file  Previous Medications   ACETAMINOPHEN (TYLENOL) 325 MG TABLET    Take 2 tablets (650 mg total) by mouth every 6 (six) hours as needed for fever, headache or mild pain.   ALBUTEROL (PROVENTIL HFA;VENTOLIN HFA) 108 (90 BASE) MCG/ACT INHALER    Inhale 2 puffs into the lungs every 6 (six)  hours as needed.   ASPIRIN EC 81 MG TABLET    Take 81 mg by mouth daily. Swallow whole.   AZELASTINE (ASTELIN) 0.1 % NASAL SPRAY    Place 2 sprays into both nostrils every evening. Use in each nostril as directed   BIOTIN 5000 MCG CAPS    Take 5,000 mcg by mouth daily.   CALCIUM CARB-CHOLECALCIFEROL (CALCIUM 600+D3) 600-20 MG-MCG TABS    Take 1 tablet by mouth 2 (two) times daily.   DIPHENHYDRAMINE (BENADRYL) 25 MG TABLET    25 mg daily as needed for allergies.   DOCUSATE SODIUM (COLACE) 100 MG CAPSULE    Take 100 mg by mouth daily.   EZETIMIBE-SIMVASTATIN (VYTORIN) 10-20 MG PER TABLET    Take 1 tablet by mouth daily after breakfast.   FLUTICASONE (FLONASE) 50 MCG/ACT NASAL SPRAY    Place 2 sprays into both nostrils daily as needed for allergies or rhinitis.   HYDROCORTISONE CREAM 1 %    Apply 1 Application topically daily as needed (Ezema).   HYDROXYPROPYL METHYLCELLULOSE / HYPROMELLOSE (ISOPTO TEARS / GONIOVISC) 2.5 % OPHTHALMIC SOLUTION    Place 1 drop into both eyes daily.   LEVALBUTEROL (  XOPENEX) 0.63 MG/3ML NEBULIZER SOLUTION    Take 3 mLs (0.63 mg total) by nebulization every 4 (four) hours.   LORATADINE (ALLERGY RELIEF) 10 MG TABLET    Take 10 mg by mouth daily.   MAGNESIUM GLUCONATE PO    Take 200 mg by mouth daily.   METHIMAZOLE (TAPAZOLE) 10 MG TABLET    Alternate 5 mg one day and 10 mg the next day   MIRTAZAPINE (REMERON SOL-TAB) 30 MG DISINTEGRATING TABLET    Take 30 mg by mouth at bedtime.   MONTELUKAST (SINGULAIR) 10 MG TABLET    Take 10 mg by mouth at bedtime.   MULTIPLE VITAMIN (MULTIVITAMIN WITH MINERALS) TABS TABLET    Take 1 tablet by mouth daily.   MUPIROCIN OINTMENT (BACTROBAN) 2 %    Place 1 Application into the nose 2 (two) times daily.   ONDANSETRON (ZOFRAN) 4 MG TABLET    Take 1 tablet (4 mg total) by mouth every 6 (six) hours as needed for nausea.   POLYETHYLENE GLYCOL (MIRALAX / GLYCOLAX) 17 G PACKET    Take 17 g by mouth 2 (two) times daily.   SENNA (SENOKOT) 8.6 MG  TABS TABLET    Take 1 tablet (8.6 mg total) by mouth 2 (two) times daily.   SULFAMETHOXAZOLE-TRIMETHOPRIM (BACTRIM) 400-80 MG TABLET    Take 1 tablet by mouth 2 (two) times daily.   SYMBICORT 160-4.5 MCG/ACT INHALER    Inhale 2 puffs into the lungs 2 (two) times daily.   TRAZODONE (DESYREL) 100 MG TABLET    Take 100 mg by mouth at bedtime.   TRIAMCINOLONE OINTMENT (KENALOG) 0.1 %    Apply 1 Application topically daily as needed (unknown).  Modified Medications   No medications on file  Discontinued Medications   No medications on file    Past Medical History Past Medical History:  Diagnosis Date   Allergy    Arthritis    ASTHMA 05/03/2009   Asthma    Carotid stenosis    Chronic kidney disease    " in remission"   COPD (chronic obstructive pulmonary disease) (HCC)    CVA (cerebral infarction)    Dyslipidemia    Headache    Hearing aid worn    B/L   HYPERTENSION 04/30/2007   off of meds since  01/2023   HYPERTHYROIDISM 06/17/2007   Hyperthyroidism    Menopause    Multinodular goiter    OSTEOPOROSIS 04/30/2007   Pneumonia    Stroke [facility])    Wears glasses     Past Surgical History Past Surgical History:  Procedure Laterality Date   BACK SURGERY     BREAST EXCISIONAL BIOPSY Left    carotid artery surgery     CATARACT EXTRACTION W/ INTRAOCULAR LENS  IMPLANT, BILATERAL     ENDARTERECTOMY Right 09/29/2019   Procedure: REDO OF RIGHT ENDARTERECTOMY CAROTID;  Surgeon: Maeola Harman, MD;  Location: [facility] OR;  Service: Vascular;  Laterality: Right;   INCISION AND DRAINAGE OF WOUND Right 04/25/2023   Procedure: IRRIGATION AND DEBRIDEMENT HIP WOUND; APPLICATION OF NEGATIVE PRESSURE WOUND VAC;  Surgeon: Samson Frederic, MD;  Location: [facility] ORS;  Service: Orthopedics;  Laterality: Right;  220   NECK SURGERY     OTHER SURGICAL HISTORY     discectomy   TOTAL HIP ARTHROPLASTY Right 02/06/2023   Procedure: ANTERIOR TOTAL HIP ARTHROPLASTY;  Surgeon: Samson Frederic, MD;  Location:  [facility] OR;  Service: Orthopedics;  Laterality: Right;   TOTAL KNEE ARTHROPLASTY Left 09/30/2021  Procedure: TOTAL KNEE ARTHROPLASTY;  Surgeon: Ollen Gross, MD;  Location: [facility] ORS;  Service: Orthopedics;  Laterality: Left;   WISDOM TOOTH EXTRACTION      Family History family history includes Breast cancer (age of onset: 39) in her mother; Heart disease in an other family member.  Social History Social History   Socioeconomic History   Marital status: Widowed    Spouse name: Not on file   Number of children: Not on file   Years of education: Not on file   Highest education level: Not on file  Occupational History   Occupation: Teacher    Employer: RETIRED  Tobacco Use   Smoking status: Former    Types: Cigarettes   Smokeless tobacco: Never   Tobacco comments:    "Quit smoking cigarettes in my 40's "  Vaping Use   Vaping status: Never Used  Substance and Sexual Activity   Alcohol use: Yes    Alcohol/week: 0.0 standard drinks of alcohol    Comment: Socially   Drug use: No   Sexual activity: Not on file  Other Topics Concern   Not on file  Social History Narrative   Lives alone.   Social Determinants of Health   Financial Resource Strain: Not on file  Food Insecurity: Patient Declined (04/23/2023)   Hunger Vital Sign    Worried About Running Out of Food in the Last Year: Patient declined    Ran Out of Food in the Last Year: Patient declined  Transportation Needs: Not on file  Physical Activity: Not on file  Stress: Not on file  Social Connections: Not on file  Intimate Partner Violence: Not on file    Laboratory Investigations Lab Results  Component Value Date   TSH 1.93 07/15/2023   FREET4 0.93 07/15/2023     Lab Results  Component Value Date   TSI <89 01/12/2023     Lab Results  Component Value Date   CREATININE 0.55 04/26/2023   No results found for: "GFR"    Component Value Date/Time   NA 139 04/26/2023 0340   K 3.6 04/26/2023 0340   CL 105  04/26/2023 0340   CO2 24 04/26/2023 0340   GLUCOSE 137 (H) 04/26/2023 0340   BUN 9 04/26/2023 0340   CREATININE 0.55 04/26/2023 0340   CALCIUM 8.7 (L) 04/26/2023 0340   PROT 6.7 04/22/2023 1436   ALBUMIN 3.3 (L) 04/22/2023 1436   AST 11 (L) 04/22/2023 1436   ALT 14 04/22/2023 1436   ALKPHOS 76 04/22/2023 1436   BILITOT 0.4 04/22/2023 1436   GFRNONAA >60 04/26/2023 0340   GFRAA >60 09/30/2019 0430      Latest Ref Rng & Units 04/26/2023    3:40 AM 04/23/2023    4:51 PM 04/22/2023    2:36 PM  BMP  Glucose 70 - 99 mg/dL 284  132  440   BUN 8 - 23 mg/dL 9  10  13    Creatinine 0.44 - 1.00 mg/dL 1.02  7.25  3.66   Sodium 135 - 145 mmol/L 139  140  139   Potassium 3.5 - 5.1 mmol/L 3.6  3.8  3.6   Chloride 98 - 111 mmol/L 105  103  103   CO2 22 - 32 mmol/L 24  28  28    Calcium 8.9 - 10.3 mg/dL 8.7  9.0  9.5        Component Value Date/Time   WBC 4.9 04/27/2023 0345   RBC 3.77 (L) 04/27/2023 0345  HGB 11.7 (L) 04/27/2023 0345   HCT 37.4 04/27/2023 0345   PLT 244 04/27/2023 0345   MCV 99.2 04/27/2023 0345   MCH 31.0 04/27/2023 0345   MCHC 31.3 04/27/2023 0345   RDW 15.1 04/27/2023 0345   LYMPHSABS 0.7 02/10/2023 0146   MONOABS 0.6 02/10/2023 0146   EOSABS 0.0 02/10/2023 0146   BASOSABS 0.0 02/10/2023 0146      Parts of this note may have been dictated using voice recognition software. There may be variances in spelling and vocabulary which are unintentional. Not all errors are proofread. Please notify the Thereasa Parkin if any discrepancies are noted or if the meaning of any statement is not clear.

## 2023-07-19 LAB — TRAB (TSH RECEPTOR BINDING ANTIBODY): TRAB: <1 IU/L (ref <=2.00)

## 2023-07-20 ENCOUNTER — Ambulatory Visit (HOSPITAL_BASED_OUTPATIENT_CLINIC_OR_DEPARTMENT_OTHER)
Admission: RE | Admit: 2023-07-20 | Discharge: 2023-07-20 | Disposition: A | Discharge status: Home / Self Care | Payer: Medicare PPO | Source: Ambulatory Visit | Attending: "Endocrinology | Admitting: "Endocrinology

## 2023-07-20 DIAGNOSIS — E042 Nontoxic multinodular goiter: Secondary | ICD-10-CM | POA: Diagnosis not present

## 2023-07-23 DIAGNOSIS — Z96641 Presence of right artificial hip joint: Secondary | ICD-10-CM | POA: Diagnosis not present

## 2023-07-23 DIAGNOSIS — I10 Essential (primary) hypertension: Secondary | ICD-10-CM | POA: Diagnosis not present

## 2023-07-23 DIAGNOSIS — I2510 Atherosclerotic heart disease of native coronary artery without angina pectoris: Secondary | ICD-10-CM | POA: Diagnosis not present

## 2023-07-23 DIAGNOSIS — M80051D Age-related osteoporosis with current pathological fracture, right femur, subsequent encounter for fracture with routine healing: Secondary | ICD-10-CM | POA: Diagnosis not present

## 2023-07-23 DIAGNOSIS — J4489 Other specified chronic obstructive pulmonary disease: Secondary | ICD-10-CM | POA: Diagnosis not present

## 2023-07-23 DIAGNOSIS — E785 Hyperlipidemia, unspecified: Secondary | ICD-10-CM | POA: Diagnosis not present

## 2023-07-23 DIAGNOSIS — T8149XA Infection following a procedure, other surgical site, initial encounter: Secondary | ICD-10-CM | POA: Diagnosis not present

## 2023-07-23 DIAGNOSIS — I700 Atherosclerosis of aorta: Secondary | ICD-10-CM | POA: Diagnosis not present

## 2023-07-23 DIAGNOSIS — I471 Supraventricular tachycardia, unspecified: Secondary | ICD-10-CM | POA: Diagnosis not present

## 2023-07-24 ENCOUNTER — Ambulatory Visit: Payer: Medicare PPO

## 2023-07-24 DIAGNOSIS — I998 Other disorder of circulatory system: Secondary | ICD-10-CM | POA: Diagnosis not present

## 2023-07-24 DIAGNOSIS — I10 Essential (primary) hypertension: Secondary | ICD-10-CM | POA: Diagnosis not present

## 2023-07-27 DIAGNOSIS — T8149XA Infection following a procedure, other surgical site, initial encounter: Secondary | ICD-10-CM | POA: Diagnosis not present

## 2023-07-27 DIAGNOSIS — E785 Hyperlipidemia, unspecified: Secondary | ICD-10-CM | POA: Diagnosis not present

## 2023-07-27 DIAGNOSIS — I2510 Atherosclerotic heart disease of native coronary artery without angina pectoris: Secondary | ICD-10-CM | POA: Diagnosis not present

## 2023-07-27 DIAGNOSIS — I10 Essential (primary) hypertension: Secondary | ICD-10-CM | POA: Diagnosis not present

## 2023-07-27 DIAGNOSIS — M80051D Age-related osteoporosis with current pathological fracture, right femur, subsequent encounter for fracture with routine healing: Secondary | ICD-10-CM | POA: Diagnosis not present

## 2023-07-27 DIAGNOSIS — I700 Atherosclerosis of aorta: Secondary | ICD-10-CM | POA: Diagnosis not present

## 2023-07-27 DIAGNOSIS — Z96641 Presence of right artificial hip joint: Secondary | ICD-10-CM | POA: Diagnosis not present

## 2023-07-27 DIAGNOSIS — I471 Supraventricular tachycardia, unspecified: Secondary | ICD-10-CM | POA: Diagnosis not present

## 2023-07-27 DIAGNOSIS — J4489 Other specified chronic obstructive pulmonary disease: Secondary | ICD-10-CM | POA: Diagnosis not present

## 2023-07-29 ENCOUNTER — Inpatient Hospital Stay
Admission: RE | Admit: 2023-07-29 | Discharge: 2023-07-29 | Disposition: A | Discharge status: Home / Self Care | Payer: Medicare PPO | Source: Ambulatory Visit | Attending: Family Medicine | Admitting: Family Medicine

## 2023-07-29 DIAGNOSIS — Z1231 Encounter for screening mammogram for malignant neoplasm of breast: Secondary | ICD-10-CM | POA: Diagnosis not present

## 2023-07-30 DIAGNOSIS — Z96641 Presence of right artificial hip joint: Secondary | ICD-10-CM | POA: Diagnosis not present

## 2023-07-30 DIAGNOSIS — J4489 Other specified chronic obstructive pulmonary disease: Secondary | ICD-10-CM | POA: Diagnosis not present

## 2023-07-30 DIAGNOSIS — I2510 Atherosclerotic heart disease of native coronary artery without angina pectoris: Secondary | ICD-10-CM | POA: Diagnosis not present

## 2023-07-30 DIAGNOSIS — I471 Supraventricular tachycardia, unspecified: Secondary | ICD-10-CM | POA: Diagnosis not present

## 2023-07-30 DIAGNOSIS — T8149XA Infection following a procedure, other surgical site, initial encounter: Secondary | ICD-10-CM | POA: Diagnosis not present

## 2023-07-30 DIAGNOSIS — I700 Atherosclerosis of aorta: Secondary | ICD-10-CM | POA: Diagnosis not present

## 2023-07-30 DIAGNOSIS — E785 Hyperlipidemia, unspecified: Secondary | ICD-10-CM | POA: Diagnosis not present

## 2023-07-30 DIAGNOSIS — M80051D Age-related osteoporosis with current pathological fracture, right femur, subsequent encounter for fracture with routine healing: Secondary | ICD-10-CM | POA: Diagnosis not present

## 2023-07-30 DIAGNOSIS — I10 Essential (primary) hypertension: Secondary | ICD-10-CM | POA: Diagnosis not present

## 2023-07-31 ENCOUNTER — Ambulatory Visit: Payer: Medicare PPO

## 2023-08-03 DIAGNOSIS — S72031D Displaced midcervical fracture of right femur, subsequent encounter for closed fracture with routine healing: Secondary | ICD-10-CM | POA: Diagnosis not present

## 2023-08-04 ENCOUNTER — Other Ambulatory Visit: Payer: Self-pay

## 2023-08-04 ENCOUNTER — Inpatient Hospital Stay (HOSPITAL_BASED_OUTPATIENT_CLINIC_OR_DEPARTMENT_OTHER)
Admission: EM | Admit: 2023-08-04 | Discharge: 2023-08-10 | DRG: 871 | Disposition: A | Discharge status: Home Health | Payer: Medicare PPO | Attending: Internal Medicine | Admitting: Internal Medicine

## 2023-08-04 ENCOUNTER — Emergency Department (HOSPITAL_BASED_OUTPATIENT_CLINIC_OR_DEPARTMENT_OTHER): Payer: Medicare PPO | Admitting: Radiology

## 2023-08-04 ENCOUNTER — Inpatient Hospital Stay (HOSPITAL_COMMUNITY): Payer: Medicare PPO

## 2023-08-04 ENCOUNTER — Encounter (HOSPITAL_BASED_OUTPATIENT_CLINIC_OR_DEPARTMENT_OTHER): Payer: Self-pay | Admitting: *Deleted

## 2023-08-04 ENCOUNTER — Emergency Department (HOSPITAL_BASED_OUTPATIENT_CLINIC_OR_DEPARTMENT_OTHER): Payer: Medicare PPO

## 2023-08-04 DIAGNOSIS — M25451 Effusion, right hip: Secondary | ICD-10-CM | POA: Diagnosis not present

## 2023-08-04 DIAGNOSIS — Z7982 Long term (current) use of aspirin: Secondary | ICD-10-CM

## 2023-08-04 DIAGNOSIS — Z882 Allergy status to sulfonamides status: Secondary | ICD-10-CM | POA: Diagnosis not present

## 2023-08-04 DIAGNOSIS — I10 Essential (primary) hypertension: Secondary | ICD-10-CM | POA: Diagnosis not present

## 2023-08-04 DIAGNOSIS — E871 Hypo-osmolality and hyponatremia: Secondary | ICD-10-CM | POA: Diagnosis not present

## 2023-08-04 DIAGNOSIS — M4649 Discitis, unspecified, multiple sites in spine: Secondary | ICD-10-CM | POA: Diagnosis present

## 2023-08-04 DIAGNOSIS — M51369 Other intervertebral disc degeneration, lumbar region without mention of lumbar back pain or lower extremity pain: Secondary | ICD-10-CM | POA: Diagnosis not present

## 2023-08-04 DIAGNOSIS — T8141XA Infection following a procedure, superficial incisional surgical site, initial encounter: Secondary | ICD-10-CM | POA: Diagnosis not present

## 2023-08-04 DIAGNOSIS — M5135 Other intervertebral disc degeneration, thoracolumbar region: Secondary | ICD-10-CM | POA: Diagnosis not present

## 2023-08-04 DIAGNOSIS — Z803 Family history of malignant neoplasm of breast: Secondary | ICD-10-CM | POA: Diagnosis not present

## 2023-08-04 DIAGNOSIS — M4627 Osteomyelitis of vertebra, lumbosacral region: Secondary | ICD-10-CM | POA: Diagnosis not present

## 2023-08-04 DIAGNOSIS — G9341 Metabolic encephalopathy: Secondary | ICD-10-CM | POA: Diagnosis not present

## 2023-08-04 DIAGNOSIS — E785 Hyperlipidemia, unspecified: Secondary | ICD-10-CM | POA: Diagnosis present

## 2023-08-04 DIAGNOSIS — M48061 Spinal stenosis, lumbar region without neurogenic claudication: Secondary | ICD-10-CM | POA: Diagnosis not present

## 2023-08-04 DIAGNOSIS — Z8673 Personal history of transient ischemic attack (TIA), and cerebral infarction without residual deficits: Secondary | ICD-10-CM | POA: Diagnosis not present

## 2023-08-04 DIAGNOSIS — A419 Sepsis, unspecified organism: Principal | ICD-10-CM

## 2023-08-04 DIAGNOSIS — M4646 Discitis, unspecified, lumbar region: Secondary | ICD-10-CM | POA: Diagnosis not present

## 2023-08-04 DIAGNOSIS — T8130XD Disruption of wound, unspecified, subsequent encounter: Secondary | ICD-10-CM

## 2023-08-04 DIAGNOSIS — R5383 Other fatigue: Secondary | ICD-10-CM | POA: Diagnosis not present

## 2023-08-04 DIAGNOSIS — M545 Low back pain, unspecified: Secondary | ICD-10-CM | POA: Diagnosis present

## 2023-08-04 DIAGNOSIS — J4489 Other specified chronic obstructive pulmonary disease: Secondary | ICD-10-CM | POA: Diagnosis present

## 2023-08-04 DIAGNOSIS — Z7951 Long term (current) use of inhaled steroids: Secondary | ICD-10-CM | POA: Diagnosis not present

## 2023-08-04 DIAGNOSIS — I700 Atherosclerosis of aorta: Secondary | ICD-10-CM | POA: Diagnosis not present

## 2023-08-04 DIAGNOSIS — Z1152 Encounter for screening for COVID-19: Secondary | ICD-10-CM | POA: Diagnosis not present

## 2023-08-04 DIAGNOSIS — Z87891 Personal history of nicotine dependence: Secondary | ICD-10-CM | POA: Diagnosis not present

## 2023-08-04 DIAGNOSIS — S72001G Fracture of unspecified part of neck of right femur, subsequent encounter for closed fracture with delayed healing: Secondary | ICD-10-CM

## 2023-08-04 DIAGNOSIS — M810 Age-related osteoporosis without current pathological fracture: Secondary | ICD-10-CM | POA: Diagnosis present

## 2023-08-04 DIAGNOSIS — M4628 Osteomyelitis of vertebra, sacral and sacrococcygeal region: Secondary | ICD-10-CM | POA: Diagnosis present

## 2023-08-04 DIAGNOSIS — Z886 Allergy status to analgesic agent status: Secondary | ICD-10-CM | POA: Diagnosis not present

## 2023-08-04 DIAGNOSIS — E0590 Thyrotoxicosis, unspecified without thyrotoxic crisis or storm: Secondary | ICD-10-CM | POA: Diagnosis present

## 2023-08-04 DIAGNOSIS — Z96641 Presence of right artificial hip joint: Secondary | ICD-10-CM | POA: Diagnosis not present

## 2023-08-04 DIAGNOSIS — R6520 Severe sepsis without septic shock: Secondary | ICD-10-CM

## 2023-08-04 DIAGNOSIS — J449 Chronic obstructive pulmonary disease, unspecified: Secondary | ICD-10-CM | POA: Diagnosis present

## 2023-08-04 DIAGNOSIS — R509 Fever, unspecified: Secondary | ICD-10-CM | POA: Diagnosis not present

## 2023-08-04 DIAGNOSIS — Z888 Allergy status to other drugs, medicaments and biological substances status: Secondary | ICD-10-CM

## 2023-08-04 DIAGNOSIS — I959 Hypotension, unspecified: Secondary | ICD-10-CM | POA: Diagnosis present

## 2023-08-04 DIAGNOSIS — Z96652 Presence of left artificial knee joint: Secondary | ICD-10-CM | POA: Diagnosis present

## 2023-08-04 DIAGNOSIS — Z885 Allergy status to narcotic agent status: Secondary | ICD-10-CM

## 2023-08-04 DIAGNOSIS — R6521 Severe sepsis with septic shock: Secondary | ICD-10-CM | POA: Diagnosis not present

## 2023-08-04 DIAGNOSIS — R0602 Shortness of breath: Secondary | ICD-10-CM | POA: Diagnosis not present

## 2023-08-04 DIAGNOSIS — M5126 Other intervertebral disc displacement, lumbar region: Secondary | ICD-10-CM | POA: Diagnosis not present

## 2023-08-04 DIAGNOSIS — Z79899 Other long term (current) drug therapy: Secondary | ICD-10-CM

## 2023-08-04 DIAGNOSIS — M47816 Spondylosis without myelopathy or radiculopathy, lumbar region: Secondary | ICD-10-CM | POA: Diagnosis not present

## 2023-08-04 DIAGNOSIS — G9340 Encephalopathy, unspecified: Secondary | ICD-10-CM | POA: Diagnosis not present

## 2023-08-04 DIAGNOSIS — M25551 Pain in right hip: Secondary | ICD-10-CM | POA: Diagnosis not present

## 2023-08-04 DIAGNOSIS — M4807 Spinal stenosis, lumbosacral region: Secondary | ICD-10-CM | POA: Diagnosis not present

## 2023-08-04 LAB — URINALYSIS, W/ REFLEX TO CULTURE (INFECTION SUSPECTED)
Bacteria, UA: NONE SEEN
Bilirubin Urine: NEGATIVE
Glucose, UA: NEGATIVE mg/dL
Hgb urine dipstick: NEGATIVE
Ketones, ur: NEGATIVE mg/dL
Leukocytes,Ua: NEGATIVE
Nitrite: NEGATIVE
Protein, ur: 30 mg/dL — AB
Specific Gravity, Urine: 1.031 — ABNORMAL HIGH (ref 1.005–1.030)
pH: 7 (ref 5.0–8.0)

## 2023-08-04 LAB — COMPREHENSIVE METABOLIC PANEL
ALT: 19 U/L (ref 0–44)
AST: 15 U/L (ref 15–41)
Albumin: 3.5 g/dL (ref 3.5–5.0)
Alkaline Phosphatase: 74 U/L (ref 38–126)
Anion gap: 10 (ref 5–15)
BUN: 19 mg/dL (ref 8–23)
CO2: 26 mmol/L (ref 22–32)
Calcium: 9.4 mg/dL (ref 8.9–10.3)
Chloride: 102 mmol/L (ref 98–111)
Creatinine, Ser: 0.65 mg/dL (ref 0.44–1.00)
GFR, Estimated: >60 mL/min (ref >60)
Glucose, Bld: 134 mg/dL — ABNORMAL HIGH (ref 70–99)
Potassium: 4.4 mmol/L (ref 3.5–5.1)
Sodium: 138 mmol/L (ref 135–145)
Total Bilirubin: 0.8 mg/dL (ref <1.2)
Total Protein: 6.4 g/dL — ABNORMAL LOW (ref 6.5–8.1)

## 2023-08-04 LAB — CBC WITH DIFFERENTIAL/PLATELET
Abs Immature Granulocytes: 0.04 10*3/uL (ref 0.00–0.07)
Basophils Absolute: 0 10*3/uL (ref 0.0–0.1)
Basophils Relative: 0 %
Eosinophils Absolute: 0 10*3/uL (ref 0.0–0.5)
Eosinophils Relative: 0 %
HCT: 39.3 % (ref 36.0–46.0)
Hemoglobin: 12.9 g/dL (ref 12.0–15.0)
Immature Granulocytes: 0 %
Lymphocytes Relative: 1 %
Lymphs Abs: 0.1 10*3/uL — ABNORMAL LOW (ref 0.7–4.0)
MCH: 32.1 pg (ref 26.0–34.0)
MCHC: 32.8 g/dL (ref 30.0–36.0)
MCV: 97.8 fL (ref 80.0–100.0)
Monocytes Absolute: 0.5 10*3/uL (ref 0.1–1.0)
Monocytes Relative: 5 %
Neutro Abs: 9.9 10*3/uL — ABNORMAL HIGH (ref 1.7–7.7)
Neutrophils Relative %: 94 %
Platelets: 243 10*3/uL (ref 150–400)
RBC: 4.02 MIL/uL (ref 3.87–5.11)
RDW: 16.6 % — ABNORMAL HIGH (ref 11.5–15.5)
WBC: 10.6 10*3/uL — ABNORMAL HIGH (ref 4.0–10.5)
nRBC: 0 % (ref 0.0–0.2)

## 2023-08-04 LAB — CREATININE, SERUM
Creatinine, Ser: 0.51 mg/dL (ref 0.44–1.00)
GFR, Estimated: >60 mL/min (ref >60)

## 2023-08-04 LAB — TROPONIN I (HIGH SENSITIVITY): Troponin I (High Sensitivity): 9 ng/L (ref <18)

## 2023-08-04 LAB — LACTIC ACID, PLASMA
Lactic Acid, Venous: 1.8 mmol/L (ref 0.5–1.9)
Lactic Acid, Venous: 2 mmol/L (ref 0.5–1.9)
Lactic Acid, Venous: 1.6 mmol/L (ref 0.5–1.9)
Lactic Acid, Venous: 1.2 mmol/L (ref 0.5–1.9)

## 2023-08-04 LAB — RESP PANEL BY RT-PCR (RSV, FLU A&B, COVID)  RVPGX2
Influenza A by PCR: NEGATIVE
Influenza B by PCR: NEGATIVE
Resp Syncytial Virus by PCR: NEGATIVE
SARS Coronavirus 2 by RT PCR: NEGATIVE

## 2023-08-04 LAB — PROTIME-INR
INR: 1.1 (ref 0.8–1.2)
Prothrombin Time: 14.5 s (ref 11.4–15.2)

## 2023-08-04 MED ORDER — MONTELUKAST SODIUM 10 MG PO TABS
10.0000 mg | ORAL_TABLET | Freq: Every day | ORAL | Status: DC
  Administered 2023-08-05 – 2023-08-09 (×6): 10 mg via ORAL
  Filled 2023-08-04 (×6): qty 1

## 2023-08-04 MED ORDER — LACTATED RINGERS IV BOLUS (SEPSIS)
1000.0000 mL | Freq: Once | INTRAVENOUS | Status: AC
Start: 2023-08-04 — End: 2023-08-04
  Administered 2023-08-04: 1000 mL via INTRAVENOUS

## 2023-08-04 MED ORDER — MOMETASONE FURO-FORMOTEROL FUM 200-5 MCG/ACT IN AERO
2.0000 | INHALATION_SPRAY | Freq: Two times a day (BID) | RESPIRATORY_TRACT | Status: DC
  Administered 2023-08-05 – 2023-08-10 (×11): 2 via RESPIRATORY_TRACT
  Filled 2023-08-04: qty 8.8

## 2023-08-04 MED ORDER — ACETAMINOPHEN 325 MG PO TABS
650.0000 mg | ORAL_TABLET | ORAL | Status: AC
  Administered 2023-08-05 – 2023-08-07 (×12): 650 mg via ORAL
  Filled 2023-08-04 (×17): qty 2

## 2023-08-04 MED ORDER — MIRTAZAPINE 30 MG PO TBDP
30.0000 mg | ORAL_TABLET | Freq: Every day | ORAL | Status: DC
  Administered 2023-08-05 – 2023-08-09 (×6): 30 mg via ORAL
  Filled 2023-08-04 (×6): qty 1

## 2023-08-04 MED ORDER — VANCOMYCIN HCL 750 MG/150ML IV SOLN
750.0000 mg | INTRAVENOUS | Status: DC
  Filled 2023-08-04: qty 150

## 2023-08-04 MED ORDER — ACETAMINOPHEN 325 MG PO TABS
650.0000 mg | ORAL_TABLET | Freq: Four times a day (QID) | ORAL | Status: DC | PRN
  Filled 2023-08-04: qty 2

## 2023-08-04 MED ORDER — EZETIMIBE-SIMVASTATIN 10-20 MG PO TABS: 1.0000 | ORAL_TABLET | Freq: Every day | ORAL | Status: DC

## 2023-08-04 MED ORDER — METHIMAZOLE 5 MG PO TABS: 5.0000 mg | ORAL_TABLET | ORAL | Status: DC

## 2023-08-04 MED ORDER — SIMVASTATIN 20 MG PO TABS
20.0000 mg | ORAL_TABLET | Freq: Every day | ORAL | Status: DC
  Administered 2023-08-05 – 2023-08-10 (×6): 20 mg via ORAL
  Filled 2023-08-04 (×6): qty 1

## 2023-08-04 MED ORDER — ENOXAPARIN SODIUM 40 MG/0.4ML IJ SOSY
40.0000 mg | PREFILLED_SYRINGE | INTRAMUSCULAR | Status: DC
  Administered 2023-08-05 – 2023-08-10 (×6): 40 mg via SUBCUTANEOUS
  Filled 2023-08-04 (×6): qty 0.4

## 2023-08-04 MED ORDER — METHIMAZOLE 10 MG PO TABS: 10.0000 mg | ORAL_TABLET | ORAL | Status: DC

## 2023-08-04 MED ORDER — SODIUM CHLORIDE 0.9% FLUSH
3.0000 mL | Freq: Two times a day (BID) | INTRAVENOUS | Status: DC
  Administered 2023-08-05 – 2023-08-09 (×9): 3 mL via INTRAVENOUS

## 2023-08-04 MED ORDER — EZETIMIBE 10 MG PO TABS
10.0000 mg | ORAL_TABLET | Freq: Every day | ORAL | Status: DC
  Administered 2023-08-05 – 2023-08-10 (×6): 10 mg via ORAL
  Filled 2023-08-04 (×6): qty 1

## 2023-08-04 MED ORDER — ACETAMINOPHEN 650 MG RE SUPP: 650.0000 mg | Freq: Four times a day (QID) | RECTAL | Status: DC | PRN

## 2023-08-04 MED ORDER — GADOBUTROL 1 MMOL/ML IV SOLN
5.0000 mL | Freq: Once | INTRAVENOUS | Status: AC | PRN
  Administered 2023-08-04: 5 mL via INTRAVENOUS

## 2023-08-04 MED ORDER — PIPERACILLIN-TAZOBACTAM 3.375 G IVPB
3.3750 g | Freq: Three times a day (TID) | INTRAVENOUS | Status: DC
  Administered 2023-08-05 (×3): 3.375 g via INTRAVENOUS
  Filled 2023-08-04 (×3): qty 50

## 2023-08-04 MED ORDER — LACTATED RINGERS IV BOLUS (SEPSIS)
250.0000 mL | Freq: Once | INTRAVENOUS | Status: AC
Start: 2023-08-04 — End: 2023-08-04
  Administered 2023-08-04: 250 mL via INTRAVENOUS

## 2023-08-04 MED ORDER — ACETAMINOPHEN 325 MG PO TABS
650.0000 mg | ORAL_TABLET | Freq: Once | ORAL | Status: AC
  Administered 2023-08-04: 650 mg via ORAL
  Filled 2023-08-04: qty 2

## 2023-08-04 MED ORDER — METRONIDAZOLE 500 MG/100ML IV SOLN
500.0000 mg | Freq: Once | INTRAVENOUS | Status: AC
  Administered 2023-08-04: 500 mg via INTRAVENOUS
  Filled 2023-08-04: qty 100

## 2023-08-04 MED ORDER — KCL-LACTATED RINGERS-D5W 20 MEQ/L IV SOLN
INTRAVENOUS | Status: AC
  Filled 2023-08-04: qty 1000

## 2023-08-04 MED ORDER — METHIMAZOLE 10 MG PO TABS
10.0000 mg | ORAL_TABLET | Freq: Every day | ORAL | Status: DC
  Administered 2023-08-05 – 2023-08-10 (×6): 10 mg via ORAL
  Filled 2023-08-04 (×6): qty 1

## 2023-08-04 MED ORDER — ALBUTEROL SULFATE (2.5 MG/3ML) 0.083% IN NEBU: 2.5000 mg | INHALATION_SOLUTION | RESPIRATORY_TRACT | Status: DC | PRN

## 2023-08-04 MED ORDER — VANCOMYCIN HCL IN DEXTROSE 1-5 GM/200ML-% IV SOLN
1000.0000 mg | Freq: Once | INTRAVENOUS | Status: AC
Start: 2023-08-04 — End: 2023-08-04
  Administered 2023-08-04: 1000 mg via INTRAVENOUS
  Filled 2023-08-04: qty 200

## 2023-08-04 MED ORDER — ASPIRIN 81 MG PO TBEC
81.0000 mg | DELAYED_RELEASE_TABLET | Freq: Every day | ORAL | Status: DC
  Administered 2023-08-05 – 2023-08-09 (×5): 81 mg via ORAL
  Filled 2023-08-04 (×5): qty 1

## 2023-08-04 MED ORDER — LACTATED RINGERS IV BOLUS (SEPSIS)
500.0000 mL | Freq: Once | INTRAVENOUS | Status: AC
Start: 2023-08-04 — End: 2023-08-04
  Administered 2023-08-04: 500 mL via INTRAVENOUS

## 2023-08-04 MED ORDER — POLYETHYLENE GLYCOL 3350 17 G PO PACK: 17.0000 g | PACK | Freq: Every day | ORAL | Status: DC | PRN

## 2023-08-04 MED ORDER — SODIUM CHLORIDE 0.9 % IV SOLN
2.0000 g | Freq: Once | INTRAVENOUS | Status: AC
  Administered 2023-08-04: 2 g via INTRAVENOUS
  Filled 2023-08-04: qty 12.5

## 2023-08-04 NOTE — ED Provider Notes (Signed)
[place] EMERGENCY DEPARTMENT AT [facility] Provider Note   CSN: [id number] Arrival date & time: 08/04/23  1238     History  Chief Complaint  Patient presents with   Weakness    Ariel Lambert is a 87 y.o. female.  Pt is a 87 yo female with pmhx significant for asthma, htn, hyperthyroidism, cva, ckd, copd, and hx of infections in right hip incisions.  Pt had a hip fx in May which was repaired by Dr. Linna Caprice.  She developed an infection at the wound site and went back to the OR for debridement by Dr. Linna Caprice.  She has been on several abx.  Most recently, she was on bactrim and on levaquin.  She went back to Dr. Linna Caprice yesterday and he sewed up an area of wound dehiscence.  This am, she had a hard time waking up.  She had a fever of 101.  She has been confused.  She has been very weak.  This is not normal for pt.  Daughter gives hx as pt is too confused.       Home Medications Prior to Admission medications   Medication Sig Start Date End Date Taking? Authorizing Provider  acetaminophen (TYLENOL) 325 MG tablet Take 2 tablets (650 mg total) by mouth every 6 (six) hours as needed for fever, headache or mild pain. 02/11/23   Sheikh, Kateri Mc Latif, DO  albuterol (PROVENTIL HFA;VENTOLIN HFA) 108 (90 BASE) MCG/ACT inhaler Inhale 2 puffs into the lungs every 6 (six) hours as needed. Patient taking differently: Inhale 2 puffs into the lungs every 6 (six) hours as needed for wheezing or shortness of breath (Asthma). 04/20/14   Leslye Peer, MD  aspirin EC 81 MG tablet Take 81 mg by mouth daily. Swallow whole.    [provider]  azelastine (ASTELIN) 0.1 % nasal spray Place 2 sprays into both nostrils every evening. Use in each nostril as directed    [provider]  Biotin 5000 MCG CAPS Take 5,000 mcg by mouth daily.    [provider]  Calcium Carb-Cholecalciferol (CALCIUM 600+D3) 600-20 MG-MCG TABS Take 1 tablet by mouth 2 (two) times daily.     [provider]  diphenhydrAMINE (BENADRYL) 25 MG tablet 25 mg daily as needed for allergies.    [provider]  docusate sodium (COLACE) 100 MG capsule Take 100 mg by mouth daily.    [provider]  ezetimibe-simvastatin (VYTORIN) 10-20 MG per tablet Take 1 tablet by mouth daily after breakfast.    [provider]  fluticasone (FLONASE) 50 MCG/ACT nasal spray Place 2 sprays into both nostrils daily as needed for allergies or rhinitis.    [provider]  hydrocortisone cream 1 % Apply 1 Application topically daily as needed (Ezema). Patient not taking: Reported on 07/15/2023    [provider]  hydroxypropyl methylcellulose / hypromellose (ISOPTO TEARS / GONIOVISC) 2.5 % ophthalmic solution Place 1 drop into both eyes daily.    [provider]  levalbuterol Pauline Aus) 0.63 MG/3ML nebulizer solution Take 3 mLs (0.63 mg total) by nebulization every 4 (four) hours. Patient taking differently: Take 0.63 mg by nebulization every 6 (six) hours as needed for wheezing or shortness of breath. 09/07/15   Rhetta Mura, MD  loratadine (ALLERGY RELIEF) 10 MG tablet Take 10 mg by mouth daily.    [provider]  MAGNESIUM GLUCONATE PO Take 200 mg by mouth daily.    [provider]  methimazole (TAPAZOLE) 10 MG tablet  Alternate 5 mg one day and 10 mg the next day 05/04/23   Altamese Montclair, MD  mirtazapine (REMERON SOL-TAB) 30 MG disintegrating tablet Take 30 mg by mouth at bedtime.    [provider]  montelukast (SINGULAIR) 10 MG tablet Take 10 mg by mouth at bedtime.    [provider]  Multiple Vitamin (MULTIVITAMIN WITH MINERALS) TABS tablet Take 1 tablet by mouth daily.    [provider]  mupirocin ointment (BACTROBAN) 2 % Place 1 Application into the nose 2 (two) times daily. Patient not taking: Reported on 04/21/2023 02/11/23   Marguerita Merles Latif, DO  ondansetron (ZOFRAN) 4 MG tablet Take 1  tablet (4 mg total) by mouth every 6 (six) hours as needed for nausea. Patient not taking: Reported on 07/15/2023 02/11/23   Marguerita Merles Latif, DO  polyethylene glycol (MIRALAX / GLYCOLAX) 17 g packet Take 17 g by mouth 2 (two) times daily. Patient not taking: Reported on 04/21/2023 02/11/23   Marguerita Merles Latif, DO  senna (SENOKOT) 8.6 MG TABS tablet Take 1 tablet (8.6 mg total) by mouth 2 (two) times daily. 02/11/23   Marguerita Merles Latif, DO  sulfamethoxazole-trimethoprim (BACTRIM) 400-80 MG tablet Take 1 tablet by mouth 2 (two) times daily.    [provider]  SYMBICORT 160-4.5 MCG/ACT inhaler Inhale 2 puffs into the lungs 2 (two) times daily. Patient not taking: Reported on 07/15/2023 07/09/15   [provider]  traZODone (DESYREL) 100 MG tablet Take 100 mg by mouth at bedtime. Patient not taking: Reported on 07/15/2023    [provider]  triamcinolone ointment (KENALOG) 0.1 % Apply 1 Application topically daily as needed (unknown).    [provider]      Allergies    Cetirizine, Codeine, Hydrocodone, Meperidine hcl, Nabumetone, Naproxen, Sulfa antibiotics, and Sulfonamide derivatives    Review of Systems   Review of Systems  Constitutional:  Positive for fever.  Neurological:  Positive for weakness.  All other systems reviewed and are negative.   Physical Exam Updated Vital Signs BP 112/73   Pulse 72   Temp 98.8 F (37.1 C) (Oral)   Resp 16   Wt 56.5 kg   SpO2 97%   BMI 22.78 kg/m  Physical Exam Vitals and nursing note reviewed.  Constitutional:      Appearance: Normal appearance.  HENT:     Head: Normocephalic and atraumatic.     Right Ear: External ear normal.     Left Ear: External ear normal.     Nose: Nose normal.     Mouth/Throat:     Mouth: Mucous membranes are dry.  Eyes:     Extraocular Movements: Extraocular movements intact.     Conjunctiva/sclera: Conjunctivae normal.     Pupils: Pupils are equal, round, and reactive  to light.  Cardiovascular:     Rate and Rhythm: Normal rate and regular rhythm.     Pulses: Normal pulses.     Heart sounds: Normal heart sounds.  Pulmonary:     Effort: Pulmonary effort is normal.     Breath sounds: Normal breath sounds.  Abdominal:     General: Abdomen is flat. Bowel sounds are normal.     Palpations: Abdomen is soft.  Musculoskeletal:        General: Normal range of motion.     Cervical back: Normal range of motion and neck supple.  Skin:    General: Skin is warm.     Capillary Refill: Capillary refill takes less  than 2 seconds.     Comments: Right hip surgical site warm to touch and red.  Sutures in place.  Neurological:     General: No focal deficit present.     Mental Status: She is alert. She is disoriented.  Psychiatric:        Mood and Affect: Mood normal.        Behavior: Behavior normal.     ED Results / Procedures / Treatments   Labs (all labs ordered are listed, but only abnormal results are displayed) Labs Reviewed  COMPREHENSIVE METABOLIC PANEL - Abnormal; Notable for the following components:      Result Value   Glucose, Bld 134 (*)    Total Protein 6.4 (*)    All other components within normal limits  LACTIC ACID, PLASMA - Abnormal; Notable for the following components:   Lactic Acid, Venous 2.0 (*)    All other components within normal limits  CBC WITH DIFFERENTIAL/PLATELET - Abnormal; Notable for the following components:   WBC 10.6 (*)    RDW 16.6 (*)    Neutro Abs 9.9 (*)    Lymphs Abs 0.1 (*)    All other components within normal limits  URINALYSIS, W/ REFLEX TO CULTURE (INFECTION SUSPECTED) - Abnormal; Notable for the following components:   Specific Gravity, Urine 1.031 (*)    Protein, ur 30 (*)    All other components within normal limits  CULTURE, BLOOD (ROUTINE X 2)  CULTURE, BLOOD (ROUTINE X 2)  RESP PANEL BY RT-PCR (RSV, FLU A&B, COVID)  [id number]  LACTIC ACID, PLASMA  PROTIME-INR  LACTIC ACID, PLASMA  LACTIC ACID,  PLASMA    EKG EKG Interpretation Date/Time:  Tuesday August 04 2023 14:38:43 EST Ventricular Rate:  82 PR Interval:    QRS Duration:  95 QT Interval:  435 QTC Calculation: 509 R Axis:   -54  Text Interpretation: Normal sinus rhythm Left anterior fascicular block Abnormal R-wave progression, late transition LVH with secondary repolarization abnormality Prolonged QT interval Confirmed by Jacalyn Lefevre [phone]) on 08/04/2023 3:13:59 PM  Radiology DG Hip Unilat W or Wo Pelvis 2-3 Views Right  Result Date: 08/04/2023 CLINICAL DATA:  Right hip pain EXAM: DG HIP (WITH OR WITHOUT PELVIS) 2-3V RIGHT COMPARISON:  02/09/2023 CT scan FINDINGS: Right hip prosthesis noted with single cerclage wire along the proximal femur. No visible periprosthetic fracture. No acute bony findings. Moderate degenerative left hip arthropathy. Lower lumbar spondylosis and degenerative disc disease. Vascular calcifications. Bony demineralization. IMPRESSION: 1. Right hip prosthesis without visible periprosthetic fracture or acute complicating feature. 2. Moderate degenerative left hip arthropathy. 3. Lower lumbar spondylosis and degenerative disc disease. 4. Bony demineralization. Electronically Signed   By: Gaylyn Rong M.D.   On: 08/04/2023 20:15   DG Chest Port 1 View  Result Date: 08/04/2023 CLINICAL DATA:  Shortness of breath weakness, fatigue.  Fever. EXAM: PORTABLE CHEST 1 VIEW COMPARISON:  02/07/2023 FINDINGS: Atherosclerotic calcification of the aortic arch. Dextroconvex thoracic scoliosis. The lungs appear clear. No blunting of the costophrenic angles. Heart size within normal limits. Degenerative spurring of both humeral heads. Clips project over the right lower neck. IMPRESSION: 1. No acute findings. 2. Dextroconvex thoracic scoliosis. 3. Aortic Atherosclerosis (ICD10-I70.0). Electronically Signed   By: Gaylyn Rong M.D.   On: 08/04/2023 20:11    Procedures Procedures    Medications Ordered in  ED Medications  acetaminophen (TYLENOL) tablet 650 mg (650 mg Oral Given 08/04/23 1459)  lactated ringers bolus 1,000 mL (0 mLs Intravenous Stopped  08/04/23 1652)    And  lactated ringers bolus 500 mL (0 mLs Intravenous Stopped 08/04/23 1748)    And  lactated ringers bolus 250 mL (0 mLs Intravenous Stopped 08/04/23 1748)  metroNIDAZOLE (FLAGYL) IVPB 500 mg (0 mg Intravenous Stopped 08/04/23 1748)  vancomycin (VANCOCIN) IVPB 1000 mg/200 mL premix (0 mg Intravenous Stopped 08/04/23 1847)  ceFEPIme (MAXIPIME) 2 g in sodium chloride 0.9 % 100 mL IVPB (0 g Intravenous Stopped 08/04/23 1733)    ED Course/ Medical Decision Making/ A&P                                 Medical Decision Making Amount and/or Complexity of Data Reviewed Labs: ordered. Radiology: ordered. ECG/medicine tests: ordered.  Risk OTC drugs. Prescription drug management. Decision regarding hospitalization.   This patient presents to the ED for concern of fever, confusion, this involves an extensive number of treatment options, and is a complaint that carries with it a high risk of complications and morbidity.  The differential diagnosis includes sepsis, infection   Co morbidities that complicate the patient evaluation  asthma, htn, hyperthyroidism, cva, ckd, copd, and hx of infections in right hip incisions   Additional history obtained:  Additional history obtained from epic chart review External records from outside source obtained and reviewed including daughter   Lab Tests:  I Ordered, and personally interpreted labs.  The pertinent results include:  cbc with wbc 10.6, lactic nl initially, now elevated at 2.0, inr nl, cmp nl; covid/flu/rsv neg; ua neg,   Imaging Studies ordered:  I ordered imaging studies including cxr, r hip  I independently visualized and interpreted imaging which showed  CXR: No acute findings.  2. Dextroconvex thoracic scoliosis.  3. Aortic Atherosclerosis (ICD10-I70.0).   R  Hip: Right hip prosthesis without visible periprosthetic fracture or  acute complicating feature.  2. Moderate degenerative left hip arthropathy.  3. Lower lumbar spondylosis and degenerative disc disease.  4. Bony demineralization.   I agree with the radiologist interpretation   Cardiac Monitoring:  The patient was maintained on a cardiac monitor.  I personally viewed and interpreted the cardiac monitored which showed an underlying rhythm of: nsr   Medicines ordered and prescription drug management:  I ordered medication including ivfs/abx  for sx  Reevaluation of the patient after these medicines showed that the patient improved I have reviewed the patients home medicines and have made adjustments as needed   Test Considered:  ct   Critical Interventions:  Abx, fluids   Consultations Obtained:  I requested consultation with the orthopedist (Dr. Shon Baton),  and discussed lab and imaging findings as well as pertinent plan -he spoke with Dr. Linna Caprice and requests [facility].  He will see pt in the am. Pt d/w Dr. Margo Aye ([facility]) who will admit.   Problem List / ED Course:  Sepsis:  pt does meet sepsis criteria.  She is given sepsis fluids and iv abx (maxipime, flagyl, and vanc).  BP has improved.  I am concerned pt has infection in her right hip as no other source of infection has been found.  Ortho will see pt in the am.   Reevaluation:  After the interventions noted above, I reevaluated the patient and found that they have :improved   Social Determinants of Health:  Lives at home   Dispostion:  After consideration of the diagnostic results and the patients response to treatment, I feel that the patent would  benefit from admission.  CRITICAL CARE Performed by: Jacalyn Lefevre   Total critical care time: 30 minutes  Critical care time was exclusive of separately billable procedures and treating other patients.  Critical care was necessary to treat or prevent imminent  or life-threatening deterioration.  Critical care was time spent personally by me on the following activities: development of treatment plan with patient and/or surrogate as well as nursing, discussions with consultants, evaluation of patient's response to treatment, examination of patient, obtaining history from patient or surrogate, ordering and performing treatments and interventions, ordering and review of laboratory studies, ordering and review of radiographic studies, pulse oximetry and re-evaluation of patient's condition.           Final Clinical Impression(s) / ED Diagnoses Final diagnoses:  Sepsis with encephalopathy and septic shock, due to unspecified organism [facility])    Rx / DC Orders ED Discharge Orders     None         Jacalyn Lefevre, MD 08/04/23 2019

## 2023-08-04 NOTE — H&P (Addendum)
History and Physical    Patient: Ariel Lambert ZOX:[id number] DOB: [birth year] DOA: 08/04/2023 DOS: the patient was seen and examined on 08/04/2023 PCP: Mila Palmer, MD  Patient coming from: Home>[facility] telemetry  Chief Complaint:  Chief Complaint  Patient presents with   Weakness   HPI: Ariel Lambert is a 87 y.o. female with medical history significant of medical history as noted below.  Of particular importance is the fact that the patient had femoral fracture back in May that was repaired operatively.  Unfortunately patient postoperative course was complicated with a potential infection requiring a revision and patient has had poor healing of the skin since then.  Patient was last seen by orthopedic surgery yesterday when wound dehiscence site was sutured over.  History is obtained primarily from the daughter at the bedside but also from the patient herself to a good extent.  Patient was in her usual state of health till yesterday evening.  Patient is generally ambulatory with a walker, alert and awake and reasonably intelligently conversant with family members and friends.  She ago she occasionally goes out with friends for lunch etc.  However since this morning patient has been noted to be basically extremely sleepy/lethargic and laying recumbent in bed.  Patient was since discovered to have a temperature of 101 F at home.  Further patient has been complaining of low back pain.  There is no complaint from the patient of ear pain sore throat cough trouble breathing chest pain neck pain. patient has a mild headache but not as severe headache.  There is no report of increased pain from the right hip.  No new rash, although patient is generally flushed.  There is no report of diarrhea or vomiting.    Patient was [facility] at around noon time and cared for by the ER attending Dr. Particia Nearing.  Patient was diagnosed with sepsis has received empiric  antibiotics, workup as noted below.  Medical evaluation is sought.  Patient/daughter feel that the patient has generally improved since her initial presentation this morning.  She is more comfortable headache has improved and she is more interactive. Review of Systems: As mentioned in the history of present illness. All other systems reviewed and are negative. Past Medical History:  Diagnosis Date   Allergy    Arthritis    ASTHMA 05/03/2009   Asthma    Carotid stenosis    Chronic kidney disease    " in remission"   COPD (chronic obstructive pulmonary disease) (HCC)    CVA (cerebral infarction)    Dyslipidemia    Headache    Hearing aid worn    B/L   HYPERTENSION 04/30/2007   off of meds since  01/2023   HYPERTHYROIDISM 06/17/2007   Hyperthyroidism    Menopause    Multinodular goiter    OSTEOPOROSIS 04/30/2007   Pneumonia    Stroke [facility])    Wears glasses    Past Surgical History:  Procedure Laterality Date   BACK SURGERY     BREAST EXCISIONAL BIOPSY Left    carotid artery surgery     CATARACT EXTRACTION W/ INTRAOCULAR LENS  IMPLANT, BILATERAL     ENDARTERECTOMY Right 09/29/2019   Procedure: REDO OF RIGHT ENDARTERECTOMY CAROTID;  Surgeon: Maeola Harman, MD;  Location: [facility] OR;  Service: Vascular;  Laterality: Right;   INCISION AND DRAINAGE OF WOUND Right 04/25/2023   Procedure: IRRIGATION AND DEBRIDEMENT HIP WOUND; APPLICATION OF NEGATIVE PRESSURE WOUND VAC;  Surgeon: Linna Caprice,  Arlys John, MD;  Location: [facility] ORS;  Service: Orthopedics;  Laterality: Right;  220   NECK SURGERY     OTHER SURGICAL HISTORY     discectomy   TOTAL HIP ARTHROPLASTY Right 02/06/2023   Procedure: ANTERIOR TOTAL HIP ARTHROPLASTY;  Surgeon: Samson Frederic, MD;  Location: [facility] OR;  Service: Orthopedics;  Laterality: Right;   TOTAL KNEE ARTHROPLASTY Left 09/30/2021   Procedure: TOTAL KNEE ARTHROPLASTY;  Surgeon: Ollen Gross, MD;  Location: [facility] ORS;  Service: Orthopedics;  Laterality: Left;   WISDOM  TOOTH EXTRACTION     Social History:  reports that she has quit smoking. Her smoking use included cigarettes. She has never used smokeless tobacco. She reports current alcohol use. She reports that she does not use drugs.  Allergies  Allergen Reactions   Cetirizine Other (See Comments)    Stomach pain   Codeine Itching    Severe itching   Hydrocodone Itching   Meperidine Hcl Diarrhea and Nausea And Vomiting    (Demerol)   Nabumetone Rash    (Relafen)   Naproxen Itching and Rash   Sulfa Antibiotics Rash   Sulfonamide Derivatives Rash    Family History  Problem Relation Age of Onset   Heart disease Other    Breast cancer Mother 44    Prior to Admission medications   Medication Sig Start Date End Date Taking? Authorizing Provider  acetaminophen (TYLENOL) 325 MG tablet Take 2 tablets (650 mg total) by mouth every 6 (six) hours as needed for fever, headache or mild pain. 02/11/23   Sheikh, Kateri Mc Latif, DO  albuterol (PROVENTIL HFA;VENTOLIN HFA) 108 (90 BASE) MCG/ACT inhaler Inhale 2 puffs into the lungs every 6 (six) hours as needed. Patient taking differently: Inhale 2 puffs into the lungs every 6 (six) hours as needed for wheezing or shortness of breath (Asthma). 04/20/14   Leslye Peer, MD  aspirin EC 81 MG tablet Take 81 mg by mouth daily. Swallow whole.    [provider]  azelastine (ASTELIN) 0.1 % nasal spray Place 2 sprays into both nostrils every evening. Use in each nostril as directed    [provider]  Biotin 5000 MCG CAPS Take 5,000 mcg by mouth daily.    [provider]  Calcium Carb-Cholecalciferol (CALCIUM 600+D3) 600-20 MG-MCG TABS Take 1 tablet by mouth 2 (two) times daily.    [provider]  diphenhydrAMINE (BENADRYL) 25 MG tablet 25 mg daily as needed for allergies.    [provider]  docusate sodium (COLACE) 100 MG capsule Take 100 mg by mouth daily.    [provider]  ezetimibe-simvastatin (VYTORIN) 10-20  MG per tablet Take 1 tablet by mouth daily after breakfast.    [provider]  fluticasone (FLONASE) 50 MCG/ACT nasal spray Place 2 sprays into both nostrils daily as needed for allergies or rhinitis.    [provider]  hydrocortisone cream 1 % Apply 1 Application topically daily as needed (Ezema). Patient not taking: Reported on 07/15/2023    [provider]  hydroxypropyl methylcellulose / hypromellose (ISOPTO TEARS / GONIOVISC) 2.5 % ophthalmic solution Place 1 drop into both eyes daily.    [provider]  levalbuterol Pauline Aus) 0.63 MG/3ML nebulizer solution Take 3 mLs (0.63 mg total) by nebulization every 4 (four) hours. Patient taking differently: Take 0.63 mg by nebulization every 6 (six) hours as needed for wheezing or shortness of breath. 09/07/15   Rhetta Mura, MD  loratadine (ALLERGY RELIEF) 10 MG tablet Take 10 mg  by mouth daily.    [provider]  MAGNESIUM GLUCONATE PO Take 200 mg by mouth daily.    [provider]  methimazole (TAPAZOLE) 10 MG tablet Alternate 5 mg one day and 10 mg the next day 05/04/23   Altamese Edgar, MD  mirtazapine (REMERON SOL-TAB) 30 MG disintegrating tablet Take 30 mg by mouth at bedtime.    [provider]  montelukast (SINGULAIR) 10 MG tablet Take 10 mg by mouth at bedtime.    [provider]  Multiple Vitamin (MULTIVITAMIN WITH MINERALS) TABS tablet Take 1 tablet by mouth daily.    [provider]  mupirocin ointment (BACTROBAN) 2 % Place 1 Application into the nose 2 (two) times daily. Patient not taking: Reported on 04/21/2023 02/11/23   Marguerita Merles Latif, DO  ondansetron (ZOFRAN) 4 MG tablet Take 1 tablet (4 mg total) by mouth every 6 (six) hours as needed for nausea. Patient not taking: Reported on 07/15/2023 02/11/23   Marguerita Merles Latif, DO  polyethylene glycol (MIRALAX / GLYCOLAX) 17 g packet Take 17 g by mouth 2 (two) times daily. Patient not taking:  Reported on 04/21/2023 02/11/23   Marguerita Merles Latif, DO  senna (SENOKOT) 8.6 MG TABS tablet Take 1 tablet (8.6 mg total) by mouth 2 (two) times daily. 02/11/23   Marguerita Merles Latif, DO  sulfamethoxazole-trimethoprim (BACTRIM) 400-80 MG tablet Take 1 tablet by mouth 2 (two) times daily.    [provider]  SYMBICORT 160-4.5 MCG/ACT inhaler Inhale 2 puffs into the lungs 2 (two) times daily. Patient not taking: Reported on 07/15/2023 07/09/15   [provider]  traZODone (DESYREL) 100 MG tablet Take 100 mg by mouth at bedtime. Patient not taking: Reported on 07/15/2023    [provider]  triamcinolone ointment (KENALOG) 0.1 % Apply 1 Application topically daily as needed (unknown).    [provider]    Physical Exam: Vitals:   08/04/23 1823 08/04/23 1900 08/04/23 2000 08/04/23 2123  BP:  112/73 127/72 129/88  Pulse:  72 72 80  Resp:  16 11 18   Temp: 98.8 F (37.1 C)   98.4 F (36.9 C)  TempSrc: Oral   Oral  SpO2:  97% 97% 96%  Weight:       General: Thin appearing lady no distress in bed.  Patient does appear markedly fatigued/sleepy to me.  However she does engage in a conversation and appears reasonably coherent in recalling events of the day.  She is not bluffing.  Asks daughter for clarification when she cannot remember something.  Patient does not appear to be distressed in terms of pain or other physiologic parameters. Respiratory exam: Bilateral intravesicular Cardiovascular exam S1-S2 normal Abdomen all quadrants are soft nontender Extremities warm without edema Musculoskeletal exam.  Right hip site incision has single fresh appearing suture.  The incision appears to be approximately 3 cm long.  However it is hard for me to accurately measure as the incision is actually inverted with skin folds being pulled out by the suture.  There is no marked tenderness at the site no spreading erythema.  I could not elicit any discharge or  fluctuation. Patient is reporting pain/tenderness at the L2-L3 area.  There is no nuchal rigidity.  Neck movements are free.  Mouth appears dry. Patinet is thirsty Data Reviewed:  Labs on Admission:  Results for orders placed or performed during the hospital encounter of 08/04/23 (from the past 24 hour(s))  Culture, blood (Routine x 2)  Status: None (Preliminary result)   Collection Time: 08/04/23  2:29 PM   Specimen: BLOOD RIGHT ARM  Result Value Ref Range   Specimen Description      BLOOD RIGHT ARM Performed at [facility], [address] [place], [place] [address]    Special Requests      Blood Culture adequate volume BOTTLES DRAWN AEROBIC AND ANAEROBIC Performed at [facility], [address] [place], [place] [address]    Culture PENDING    Report Status PENDING   Culture, blood (Routine x 2)     Status: None (Preliminary result)   Collection Time: 08/04/23  2:34 PM   Specimen: BLOOD LEFT ARM  Result Value Ref Range   Specimen Description      BLOOD LEFT ARM Performed at [facility], [address] [place], [place] [address]    Special Requests      Blood Culture adequate volume BOTTLES DRAWN AEROBIC AND ANAEROBIC Performed at [facility], [address] [place], [place] [address]    Culture PENDING    Report Status PENDING   Comprehensive metabolic panel     Status: Abnormal   Collection Time: 08/04/23  2:48 PM  Result Value Ref Range   Sodium 138 135 - 145 mmol/L   Potassium 4.4 3.5 - 5.1 mmol/L   Chloride 102 98 - 111 mmol/L   CO2 26 22 - 32 mmol/L   Glucose, Bld 134 (H) 70 - 99 mg/dL   BUN 19 8 - 23 mg/dL   Creatinine, Ser 2.95 0.44 - 1.00 mg/dL   Calcium 9.4 8.9 - 62.1 mg/dL   Total Protein 6.4 (L) 6.5 - 8.1 g/dL   Albumin 3.5 3.5 - 5.0 g/dL   AST 15 15 - 41 U/L   ALT 19 0 - 44 U/L   Alkaline Phosphatase 74 38 - 126 U/L   Total Bilirubin 0.8 <1.2 mg/dL   GFR, Estimated >30 >86 mL/min   Anion  gap 10 5 - 15  Lactic acid, plasma     Status: None   Collection Time: 08/04/23  2:48 PM  Result Value Ref Range   Lactic Acid, Venous 1.8 0.5 - 1.9 mmol/L  CBC with Differential     Status: Abnormal   Collection Time: 08/04/23  2:48 PM  Result Value Ref Range   WBC 10.6 (H) 4.0 - 10.5 K/uL   RBC 4.02 3.87 - 5.11 MIL/uL   Hemoglobin 12.9 12.0 - 15.0 g/dL   HCT 57.8 46.9 - 62.9 %   MCV 97.8 80.0 - 100.0 fL   MCH 32.1 26.0 - 34.0 pg   MCHC 32.8 30.0 - 36.0 g/dL   RDW 52.8 (H) 41.3 - 24.4 %   Platelets 243 150 - 400 K/uL   nRBC 0.0 0.0 - 0.2 %   Neutrophils Relative % 94 %   Neutro Abs 9.9 (H) 1.7 - 7.7 K/uL   Lymphocytes Relative 1 %   Lymphs Abs 0.1 (L) 0.7 - 4.0 K/uL   Monocytes Relative 5 %   Monocytes Absolute 0.5 0.1 - 1.0 K/uL   Eosinophils Relative 0 %   Eosinophils Absolute 0.0 0.0 - 0.5 K/uL   Basophils Relative 0 %   Basophils Absolute 0.0 0.0 - 0.1 K/uL   Immature Granulocytes 0 %   Abs Immature Granulocytes 0.04 0.00 - 0.07 K/uL  Protime-INR     Status: None   Collection Time: 08/04/23  2:48 PM  Result Value Ref  Range   Prothrombin Time 14.5 11.4 - 15.2 seconds   INR 1.1 0.8 - 1.2  Resp panel by RT-PCR (RSV, Flu A&B, Covid) Anterior Nasal Swab     Status: None   Collection Time: 08/04/23  4:03 PM   Specimen: Anterior Nasal Swab  Result Value Ref Range   SARS Coronavirus 2 by RT PCR NEGATIVE NEGATIVE   Influenza A by PCR NEGATIVE NEGATIVE   Influenza B by PCR NEGATIVE NEGATIVE   Resp Syncytial Virus by PCR NEGATIVE NEGATIVE  Lactic acid, plasma     Status: Abnormal   Collection Time: 08/04/23  5:32 PM  Result Value Ref Range   Lactic Acid, Venous 2.0 (HH) 0.5 - 1.9 mmol/L  Urinalysis, w/ Reflex to Culture (Infection Suspected) -Urine, Clean Catch     Status: Abnormal   Collection Time: 08/04/23  5:32 PM  Result Value Ref Range   Specimen Source URINE, CATHETERIZED    Color, Urine YELLOW YELLOW   APPearance CLEAR CLEAR   Specific Gravity, Urine 1.031 (H)  1.005 - 1.030   pH 7.0 5.0 - 8.0   Glucose, UA NEGATIVE NEGATIVE mg/dL   Hgb urine dipstick NEGATIVE NEGATIVE   Bilirubin Urine NEGATIVE NEGATIVE   Ketones, ur NEGATIVE NEGATIVE mg/dL   Protein, ur 30 (A) NEGATIVE mg/dL   Nitrite NEGATIVE NEGATIVE   Leukocytes,Ua NEGATIVE NEGATIVE   RBC / HPF 0-5 0 - 5 RBC/hpf   WBC, UA 0-5 0 - 5 WBC/hpf   Bacteria, UA NONE SEEN NONE SEEN   Squamous Epithelial / HPF 0-5 0 - 5 /HPF   Mucus PRESENT   Lactic acid, plasma     Status: None   Collection Time: 08/04/23  8:09 PM  Result Value Ref Range   Lactic Acid, Venous 1.6 0.5 - 1.9 mmol/L   Basic Metabolic Panel: Recent Labs  Lab 08/04/23 1448  NA 138  K 4.4  CL 102  CO2 26  GLUCOSE 134*  BUN 19  CREATININE 0.65  CALCIUM 9.4   Liver Function Tests: Recent Labs  Lab 08/04/23 1448  AST 15  ALT 19  ALKPHOS 74  BILITOT 0.8  PROT 6.4*  ALBUMIN 3.5   No results for input(s): "LIPASE", "AMYLASE" in the last 168 hours. No results for input(s): "AMMONIA" in the last 168 hours. CBC: Recent Labs  Lab 08/04/23 1448  WBC 10.6*  NEUTROABS 9.9*  HGB 12.9  HCT 39.3  MCV 97.8  PLT 243   Cardiac Enzymes: No results for input(s): "CKTOTAL", "CKMB", "CKMBINDEX", "TROPONINIHS" in the last 168 hours.  BNP (last 3 results) No results for input(s): "PROBNP" in the last 8760 hours. CBG: No results for input(s): "GLUCAP" in the last 168 hours.  Radiological Exams on Admission:  DG Hip Unilat W or Wo Pelvis 2-3 Views Right  Result Date: 08/04/2023 CLINICAL DATA:  Right hip pain EXAM: DG HIP (WITH OR WITHOUT PELVIS) 2-3V RIGHT COMPARISON:  02/09/2023 CT scan FINDINGS: Right hip prosthesis noted with single cerclage wire along the proximal femur. No visible periprosthetic fracture. No acute bony findings. Moderate degenerative left hip arthropathy. Lower lumbar spondylosis and degenerative disc disease. Vascular calcifications. Bony demineralization. IMPRESSION: 1. Right hip prosthesis without  visible periprosthetic fracture or acute complicating feature. 2. Moderate degenerative left hip arthropathy. 3. Lower lumbar spondylosis and degenerative disc disease. 4. Bony demineralization. Electronically Signed   By: Gaylyn Rong M.D.   On: 08/04/2023 20:15   DG Chest Port 1 View  Result Date: 08/04/2023 CLINICAL DATA:  Shortness of breath weakness, fatigue.  Fever. EXAM: PORTABLE CHEST 1 VIEW COMPARISON:  02/07/2023 FINDINGS: Atherosclerotic calcification of the aortic arch. Dextroconvex thoracic scoliosis. The lungs appear clear. No blunting of the costophrenic angles. Heart size within normal limits. Degenerative spurring of both humeral heads. Clips project over the right lower neck. IMPRESSION: 1. No acute findings. 2. Dextroconvex thoracic scoliosis. 3. Aortic Atherosclerosis (ICD10-I70.0). Electronically Signed   By: Gaylyn Rong M.D.   On: 08/04/2023 20:11    EKG: Independently reviewed. nsr   Assessment and Plan: * Sepsis with encephalopathy (HCC) Although no fever is recorded in the medical record, patient daughter reports a temperature of 101 F at home.  Patient has leukocytosis and patient's blood pressure was soft at initial presentation.  Patient received 1.5 L of fluid and blood pressure seems to have improved mentation seems to have improved.  Patient has gotten lactic acid of 2.0 initially improved.  At this time patient still appears to be dry and I will continue 100 cc an hour of fluid overnight with reevaluation in the morning.  With regards to the focus of infection.  Respiratory panel, limited as negative.  Principal concern is the fact that the patient is complaining of low back pain and obviously has had an open sore in the right hip since May.  I do not appreciate any cross-sectional imaging in the chart of the affected areas thus far.  I think it is time to do cross-sectional imaging of low back and right hip.  To best guide our further treatment.  In the  interim I will continue patient on empiric vancomycin and zosyn for suspected osteomyelitis of the sites. Blood cultures pending.  Patient's mentation, per daughter has improved.  Patient did not demonstrate any marked confusion/hallucination or focal deficits to me.  No nuchal rigidity.  I think this is due to metabolic encephalopathy from sepsis we will continue with clinical monitoring in this regard.   Tropoinin pending add on.  Daughter provided medication list as shown. Meds were ordered. Please folow up after pharmacy input. Held anti HTN regimen due to hypotension in ER.     Media Information  Document Information  Photos  Med list  08/04/2023 22:11  Attached To:  Hospital Encounter on 08/04/23  Source Information  Nolberto Hanlon, MD  [id number]  Document History      Advance Care Planning:   Code Status: Prior full code. Discussed with daughter/patient at bedside.  Consults: ortho consult Is pending at this time - called by ER provider. They will evaluate patinet in AM  Family Communication: daughter at bedside. Careplan discussed.  Severity of Illness: The appropriate patient status for this patient is INPATIENT. Inpatient status is judged to be reasonable and necessary in order to provide the required intensity of service to ensure the patient's safety. The patient's presenting symptoms, physical exam findings, and initial radiographic and laboratory data in the context of their chronic comorbidities is felt to place them at high risk for further clinical deterioration. Furthermore, it is not anticipated that the patient will be medically stable for discharge from the hospital within 2 midnights of admission.   * I certify that at the point of admission it is my clinical judgment that the patient will require inpatient hospital care spanning beyond 2 midnights from the point of admission due to high intensity of service, high risk for further deterioration and high  frequency of surveillance required.*  Author: Nolberto Hanlon, MD 08/04/2023 9:48 PM  For on call review [email or web address].

## 2023-08-04 NOTE — Progress Notes (Signed)
ED Pharmacy Antibiotic Sign Off An antibiotic consult was received from an ED provider for vancomycin and cefepime per pharmacy dosing for sepsis. A chart review was completed to assess appropriateness.   The following one time order(s) were placed:  Cefepime 2gm IV x 1 Vancomycin 1g IV x 1  Further antibiotic and/or antibiotic pharmacy consults should be ordered by the admitting provider if indicated.   Thank you for allowing pharmacy to be a part of this patient's care.   Jovanie Verge, Drake Leach, [facility]  Clinical Pharmacist 08/04/23 3:48 PM

## 2023-08-04 NOTE — ED Triage Notes (Signed)
Here by POV from home with daughter for general weakness, fatigue, and "out of it (lethargy and confusion)", also endorses fever 101.0. Sx onset this am. Seen by home health nurse this am, encouraged to come to ED. Hip surgery in May, but incision won't heal, seen by orthopedist yesterday. Some sutures placed, and some removed. Recent antibiotics, but none currently.

## 2023-08-04 NOTE — Progress Notes (Signed)
Notified provider of need to order repeat lactic acid. ° °

## 2023-08-04 NOTE — Progress Notes (Signed)
Pt has arrived to unit/room 1518 via stretcher, by [facility]. Pt is warm, dry, no visible distress.

## 2023-08-04 NOTE — Progress Notes (Signed)
On call provider at bedside, along with daughter.

## 2023-08-04 NOTE — Assessment & Plan Note (Addendum)
08-04-2023 Although no fever is recorded in the medical record, patient daughter reports a temperature of 101 F at home.  Patient has leukocytosis and patient's blood pressure was soft at initial presentation.  Patient received 1.5 L of fluid and blood pressure seems to have improved mentation seems to have improved.  Patient has gotten lactic acid of 2.0 initially improved.  At this time patient still appears to be dry and I will continue 100 cc an hour of fluid overnight with reevaluation in the morning.  With regards to the focus of infection.  Respiratory panel, limited as negative.  Principal concern is the fact that the patient is complaining of low back pain and obviously has had an open sore in the right hip since May.  I do not appreciate any cross-sectional imaging in the chart of the affected areas thus far.  I think it is time to do cross-sectional imaging of low back and right hip.  To best guide our further treatment.  In the interim I will continue patient on empiric vancomycin and zosyn for suspected osteomyelitis of the sites. Blood cultures pending.  Patient's mentation, per daughter has improved.  Patient did not demonstrate any marked confusion/hallucination or focal deficits to me.  No nuchal rigidity.  I think this is due to metabolic encephalopathy from sepsis we will continue with clinical monitoring in this regard.  08-05-2023 likely due to discitis. Was present on admission. WBC 10.6, lactic acid 2.0. MRI lumbar spine shows discitis. Now resolved.  08-09-2023 blood cx negative. Right hip synovial cultures are 4 days negative.  08-10-2023 synovial cx negative(final). Blood cx negative(finale). Complete IV ABX see below

## 2023-08-04 NOTE — Sepsis Progress Note (Signed)
[facility] will follow per sepsis protocol  

## 2023-08-04 NOTE — Progress Notes (Signed)
Pharmacy Antibiotic Note  Ariel Lambert is a 87 y.o. female admitted on 08/04/2023 with sepsis.  Pharmacy has been consulted for Vanc, zosyn dosing.  Active Problem(s): weakness, lethargic, fever, low back pain  PMH:  - femoral fracture back in May that was repaired operatively. Unfortunately patient postoperative course was complicated with a potential infection requiring a revision and patient has had poor healing of the skin since then. last seen by orthopedic surgery yesterday when wound dehiscence site was sutured over.   ID: Sepsis from wound infection.  Tmax 99.3, WBC 10.6, Scr <1  Vanco 11/20>> Zosyn 11/20>>  Plan: Vanco 1g in ED Vancomycin 750mg  IV Q 24 hrs. Goal AUC 400-550. Expected AUC: 483 SCr used: 0.8    Weight: 56.5 kg (124 lb 9 oz)  Temp (24hrs), Avg:98.8 F (37.1 C), Min:98.4 F (36.9 C), Max:99.3 F (37.4 C)  Recent Labs  Lab 08/04/23 1448 08/04/23 1732 08/04/23 2009 08/04/23 2215 08/04/23 2250  WBC 10.6*  --   --   --   --   CREATININE 0.65  --   --   --  0.51  LATICACIDVEN 1.8 2.0* 1.6 1.2  --     Estimated Creatinine Clearance: 40.7 mL/min (by C-G formula based on SCr of 0.51 mg/dL).    Allergies  Allergen Reactions   Cetirizine Other (See Comments)    Stomach pain   Codeine Itching    Severe itching   Hydrocodone Itching   Meperidine Hcl Diarrhea and Nausea And Vomiting    (Demerol)   Nabumetone Rash    (Relafen)   Naproxen Itching and Rash   Sulfa Antibiotics Rash   Sulfonamide Derivatives Rash     Ariel Fassnacht S. Ariel Lambert, PharmD, BCPS Clinical Staff Pharmacist [email or web address] Ariel Lambert 08/04/2023 11:26 PM

## 2023-08-05 ENCOUNTER — Encounter (HOSPITAL_COMMUNITY): Payer: Self-pay | Admitting: Internal Medicine

## 2023-08-05 ENCOUNTER — Inpatient Hospital Stay (HOSPITAL_COMMUNITY): Payer: Medicare PPO

## 2023-08-05 DIAGNOSIS — M4646 Discitis, unspecified, lumbar region: Secondary | ICD-10-CM

## 2023-08-05 DIAGNOSIS — E871 Hypo-osmolality and hyponatremia: Secondary | ICD-10-CM

## 2023-08-05 DIAGNOSIS — I10 Essential (primary) hypertension: Secondary | ICD-10-CM

## 2023-08-05 DIAGNOSIS — J449 Chronic obstructive pulmonary disease, unspecified: Secondary | ICD-10-CM

## 2023-08-05 DIAGNOSIS — A419 Sepsis, unspecified organism: Secondary | ICD-10-CM | POA: Diagnosis not present

## 2023-08-05 DIAGNOSIS — E0590 Thyrotoxicosis, unspecified without thyrotoxic crisis or storm: Secondary | ICD-10-CM

## 2023-08-05 LAB — BASIC METABOLIC PANEL
Anion gap: 8 (ref 5–15)
BUN: 17 mg/dL (ref 8–23)
CO2: 23 mmol/L (ref 22–32)
Calcium: 8.3 mg/dL — ABNORMAL LOW (ref 8.9–10.3)
Chloride: 101 mmol/L (ref 98–111)
Creatinine, Ser: 0.52 mg/dL (ref 0.44–1.00)
GFR, Estimated: >60 mL/min (ref >60)
Glucose, Bld: 101 mg/dL — ABNORMAL HIGH (ref 70–99)
Potassium: 3.6 mmol/L (ref 3.5–5.1)
Sodium: 132 mmol/L — ABNORMAL LOW (ref 135–145)

## 2023-08-05 LAB — TSH: TSH: 0.255 u[IU]/mL — ABNORMAL LOW (ref 0.350–4.500)

## 2023-08-05 LAB — CBC
HCT: 38.4 % (ref 36.0–46.0)
Hemoglobin: 12.3 g/dL (ref 12.0–15.0)
MCH: 32.3 pg (ref 26.0–34.0)
MCHC: 32 g/dL (ref 30.0–36.0)
MCV: 100.8 fL — ABNORMAL HIGH (ref 80.0–100.0)
Platelets: 188 10*3/uL (ref 150–400)
RBC: 3.81 MIL/uL — ABNORMAL LOW (ref 3.87–5.11)
RDW: 16.7 % — ABNORMAL HIGH (ref 11.5–15.5)
WBC: 7.1 10*3/uL (ref 4.0–10.5)
nRBC: 0 % (ref 0.0–0.2)

## 2023-08-05 LAB — SEDIMENTATION RATE: Sed Rate: 17 mm/h (ref 0–22)

## 2023-08-05 LAB — C-REACTIVE PROTEIN: CRP: 10.2 mg/dL — ABNORMAL HIGH (ref <1.0)

## 2023-08-05 LAB — SYNOVIAL CELL COUNT + DIFF, W/ CRYSTALS
Eosinophils-Synovial: 2 % — ABNORMAL HIGH (ref 0–1)
Lymphocytes-Synovial Fld: 24 % — ABNORMAL HIGH (ref 0–20)
Monocyte-Macrophage-Synovial Fluid: 33 % — ABNORMAL LOW (ref 50–90)
Neutrophil, Synovial: 51 % — ABNORMAL HIGH (ref 0–25)
WBC, Synovial: 390 /mm3 — ABNORMAL HIGH (ref 0–200)

## 2023-08-05 LAB — CK: Total CK: 19 U/L — ABNORMAL LOW (ref 38–234)

## 2023-08-05 LAB — APTT: aPTT: 26 s (ref 24–36)

## 2023-08-05 MED ORDER — DIPHENHYDRAMINE-ZINC ACETATE 2-0.1 % EX CREA
TOPICAL_CREAM | Freq: Two times a day (BID) | CUTANEOUS | Status: DC | PRN
  Filled 2023-08-05: qty 28

## 2023-08-05 MED ORDER — LIDOCAINE HCL 1 % IJ SOLN
INTRAMUSCULAR | Status: AC
  Filled 2023-08-05: qty 20

## 2023-08-05 MED ORDER — SODIUM CHLORIDE 0.9 % IV SOLN
2.0000 g | INTRAVENOUS | Status: DC
  Administered 2023-08-05 – 2023-08-06 (×2): 2 g via INTRAVENOUS
  Filled 2023-08-05 (×2): qty 20

## 2023-08-05 MED ORDER — DAPTOMYCIN-SODIUM CHLORIDE 500-0.9 MG/50ML-% IV SOLN
8.0000 mg/kg | Freq: Every day | INTRAVENOUS | Status: AC
  Administered 2023-08-05 – 2023-08-07 (×3): 500 mg via INTRAVENOUS
  Filled 2023-08-05 (×3): qty 50

## 2023-08-05 NOTE — Progress Notes (Signed)
Pharmacy Antibiotic Note  Ariel Lambert is a 87 y.o. female admitted on 08/04/2023 with sepsis, early discitis. Patient had right femoral neck fracture s/p hemiarthroplasty in may 2024 complicated with ecoli superficial wound infection s/p I xD in August 2024. She was seen by orthopedics for wound dehiscence on 11/18 and has continued to feel poorly since then. MRI of spine is showing L4-S1 early discitis. She was empirically started on vancomycin and Zosyn therapy on 11/19. Pharmacy is now being consulted for daptomycin dosing, per ID, to minimize AKI risk.  Plan: Stop vancomycin and Zosyn Start IV daptomycin 8mg /kg once daily Start IV ceftriaxone 2g once daily, per ID Check initial CK today and then weekly thereafter  Monitor cultures and overall clinical picture  Weight: 56.5 kg (124 lb 9 oz)  Temp (24hrs), Avg:98.5 F (36.9 C), Min:97.8 F (36.6 C), Max:99.1 F (37.3 C)  Recent Labs  Lab 08/04/23 1448 08/04/23 1732 08/04/23 2009 08/04/23 2215 08/04/23 2250 08/05/23 0032  WBC 10.6*  --   --   --   --  7.1  CREATININE 0.65  --   --   --  0.51 0.52  LATICACIDVEN 1.8 2.0* 1.6 1.2  --   --     Estimated Creatinine Clearance: 40.7 mL/min (by C-G formula based on SCr of 0.52 mg/dL).    Allergies  Allergen Reactions   Cetirizine Other (See Comments)    Stomach pain   Codeine Itching    Severe itching   Hydrocodone Itching   Meperidine Hcl Diarrhea and Nausea And Vomiting    (Demerol)   Nabumetone Rash    (Relafen)   Naproxen Itching and Rash   Sulfa Antibiotics Rash   Sulfonamide Derivatives Rash    Antibiotics this admission: 11/19 cefepime x1 11/19 Flagyl x1 11/19 vancomycin x1 11/20 Zosyn x1 11/20 daptomycin >> 11/20 ceftriaxone >>  Microbiology data: 11/19 Bcx: NG <24hrs 11/20 R hip aspirate cx: in process   Cherylin Mylar, PharmD Clinical Pharmacist  11/20/20245:18 PM

## 2023-08-05 NOTE — Assessment & Plan Note (Addendum)
08-05-2023 repeat BMP in AM.  08-06-2023 BMP pending today.  08-07-2023 resolved. Na yesterday 139. Likely due to poor po intake.  08-08-2023 Na 139 today.  08-10-2023 DC Na 141

## 2023-08-05 NOTE — Hospital Course (Addendum)
HPI:  Ariel Lambert is a 87 y.o. female with medical history significant of medical history as noted below.  Of particular importance is the fact that the patient had femoral fracture back in May that was repaired operatively.  Unfortunately patient postoperative course was complicated with a potential infection requiring a revision and patient has had poor healing of the skin since then.  Patient was last seen by orthopedic surgery yesterday when wound dehiscence site was sutured over.   History is obtained primarily from the daughter at the bedside but also from the patient herself to a good extent.  Patient was in her usual state of health till yesterday evening.  Patient is generally ambulatory with a walker, alert and awake and reasonably intelligently conversant with family members and friends.  She ago she occasionally goes out with friends for lunch etc.   However since this morning patient has been noted to be basically extremely sleepy/lethargic and laying recumbent in bed.  Patient was since discovered to have a temperature of 101 F at home.  Further patient has been complaining of low back pain.  There is no complaint from the patient of ear pain sore throat cough trouble breathing chest pain neck pain. patient has a mild headache but not as severe headache.  There is no report of increased pain from the right hip.  No new rash, although patient is generally flushed.  There is no report of diarrhea or vomiting.     Patient was initially taken to the [facility] at around noon time and cared for by the ER attending Dr. Particia Nearing.  Patient was diagnosed with sepsis has received empiric antibiotics, workup as noted below.  Medical evaluation is sought.   Patient/daughter feel that the patient has generally improved since her initial presentation this morning.  She is more comfortable headache has improved and she is more interactive.  Significant Events: Admitted 08/04/2023 for  sepsis with encephalopathy   Significant Labs: Admission WBC 10.6, lactic acid 2.0, BP 87/64(responded to IVF)  Significant Imaging Studies: Admission MRI lumbar spine shows Some enhancement in the discs at L4-L5 and L5-S1, with irregularity of the adjacent endplates at L5-S1 and small foci of increased T2 signal and enhancement at the L4 endplate, which could represent early discitis-osteomyelitis versus the sequela of degenerative disc disease. Correlate with ESR and CRP. 2. Minimal enhancement in some of the left cauda equina nerve roots at L4 and L5, with some mild nerve root thickening, suggestive of arachnoiditis. 3. L3-L4 mild-to-moderate spinal canal stenosis. 4. L1-L2 and L2-L3 mild spinal canal stenosis. 5. L1-L2 mild spinal canal stenosis. 6. L5-S1 mild left neural foraminal narrowing. 7. Narrowing of the lateral recesses at L1-L2, L2-L3, L3-L4, and L4-L5 could affect the descending L2, L3, L4, and L5 nerve roots, respectively.  Antibiotic Therapy: Anti-infectives (From admission, onward)    Start     Dose/Rate Route Frequency Ordered Stop   08/05/23 1745  vancomycin (VANCOREADY) IVPB 750 mg/150 mL        750 mg 150 mL/hr over 60 Minutes Intravenous Every 24 hours 08/04/23 2326     08/05/23 0000  piperacillin-tazobactam (ZOSYN) IVPB 3.375 g        3.375 g 12.5 mL/hr over 240 Minutes Intravenous Every 8 hours 08/04/23 2259     08/04/23 1515  metroNIDAZOLE (FLAGYL) IVPB 500 mg        500 mg 100 mL/hr over 60 Minutes Intravenous  Once 08/04/23 1505 08/04/23 1748   08/04/23 1515  vancomycin (VANCOCIN) IVPB 1000 mg/200 mL premix        1,000 mg 200 mL/hr over 60 Minutes Intravenous  Once 08/04/23 1508 08/04/23 1847   08/04/23 1515  ceFEPIme (MAXIPIME) 2 g in sodium chloride 0.9 % 100 mL IVPB        2 g 200 mL/hr over 30 Minutes Intravenous  Once 08/04/23 1508 08/04/23 1733       Procedures:   Consultants: orthopedics

## 2023-08-05 NOTE — Assessment & Plan Note (Addendum)
08-05-2023 MRI lumbar spine shows discitis. Ortho consulted and order right hip aspiration. Remains on IV zosyn/vanco. Will enlist assistance of ID to guide abx management and duration.  08-06-2023 Right hip synovial fluid shows WBC 390 per ml. Does not appear to be septic joint. Septic joint criteria of >50K WBC per ml is the common criteria for septic arthritis.  Synovial fluid cx negative thus far.  Placed on cubicin and rocephin yesterday(08-05-2023) per ID consult. Expected duration of treatment will be 6 weeks. Changed to IV rocephin and IV daptomycin yesterday. Discussed with pt's dtr lisa hill. She states that her brother-in-law is a Careers adviser in [place]. Pt's family is [place] would be willing to take patient into their home and provide care for patient for the next 6-8 weeks. Pt's grand-dtr that lives in [place] is also a PA.  Discussed with dtr lisa that she would be in charge of getting a homecare company name as our SW/CM do not know of any homecare companies in [place]. Homecare company would need to be able to provide IV ABX, HHRN and HHPT for patient. We could order a wheelchair for patient. Dtr would drive patient down to [place] on Monday, August 10, 2023. Pt could have a temporary foley catheter for the drive to [place] and this could be removed by family when pt arrives to their home in [place]. Discussed with Dr. Drue Second who gives approval to place PICC line today.  08-07-2023 PICC line placed 08-06-2023.  home IV ABX with daptomycin 8mg /kg/day plus ceftriaxone 2gm IV daily. End date:Jan 2nd 2025. Plan for DC on Monday, August 10, 2023  08-08-2023 will place foley catheter tomorrow night. This can be removed by pt's son-in-law that is a Careers adviser in [place]. Pharmacy to dose IV ABX early on Monday AM so pt can be discharged.  08-09-2023 will place foley tonight. Had messaged pharmacist to adjust IV ABX administration time so pt is ready to leave by 8 AM Monday morning.  08-10-2023  ready for DC. Will DC with foley and PICC line. Foley to be removed today after pt arrives in [place].  End Date abx Sep 17, 2023.  Remains on Daptomyin IV 500 every day (8 mg/kg/day) and IV rocephin 2 gram every day.

## 2023-08-05 NOTE — Progress Notes (Signed)
PT Cancellation Note  Patient Details Name: MAKENZE BRAMANTE MRN: [id number] DOB: [birth year]   Cancelled Treatment:    Reason Eval/Treat Not Completed: Medical issues which prohibited therapy Recen aspiration of hip, hip MRI results pending. Will check back tomorrow. Blanchard Kelch PT Acute Rehabilitation Services Office [phone] Weekend pager-[phone]   Jalayia, Elizardo 08/05/2023, 3:33 PM

## 2023-08-05 NOTE — Assessment & Plan Note (Addendum)
08-05-2023 stable.  08-10-2023 remains on singulair 10 mg at bedtime, dulera (200-5) 2 puffs bid, prn xopenex and prn albuterol

## 2023-08-05 NOTE — Assessment & Plan Note (Addendum)
08-05-2023 continue methimazole  08-10-2023 continue with methimazole at discharge. Alternating doses of 5 mg every other day and 10 mg every other day

## 2023-08-05 NOTE — Consult Note (Signed)
ORTHOPAEDIC CONSULTATION  REQUESTING PHYSICIAN: Carollee Herter, DO  PCP:  Mila Palmer, MD  Chief Complaint: evaluate right hip  HPI: Ariel Lambert is a 87 y.o. female, well-known to me, for her right hip.  She underwent right hip hemiarthroplasty by myself on 02/06/2023 for displaced femoral neck fracture.  At that same time, she was treated for an Enterococcus UTI.  Postoperatively, she developed a superficial pansensitive E. coli wound infection, and I took her to the operating room for superficial debridement and closure on 04/25/2023.  Since that time, she has had difficulty healing the wound.  I saw her in the office this week, and she was found to have a small subcentimeter area where the skin had not healed.  There was no underlying fluid collection or evidence of infection.  I placed nylon sutures.  Subsequently she had mental status changes yesterday and was lethargic.  She reported a temperature of 101 degrees at home.  She came to the emergency department and was admitted for sepsis criteria.  She was started on IV Zosyn and vancomycin.  In the ER, her serum white blood cell count was 10.6, and this morning it is normal at 7.1.  UA was negative.  She has been afebrile since arrival.  Patient reports that she does not have any right hip pain.  She has been having low back pain.  Last night, she had an MRI of the lumbar spine.  She had disc enhancement at L4-5 and L5-S1 with other changes consistent with early discitis/osteomyelitis.  She was also found to have multilevel mild to moderate canal stenosis and neuroforaminal narrowing.  She also had an MRI of the right hip last night that has not been read by radiology as of yet.  Past Medical History:  Diagnosis Date   Allergy    Arthritis    ASTHMA 05/03/2009   Asthma    Carotid stenosis    Chronic kidney disease    " in remission"   COPD (chronic obstructive pulmonary disease) (HCC)    CVA (cerebral infarction)     Dyslipidemia    Headache    Hearing aid worn    B/L   HYPERTENSION 04/30/2007   off of meds since  01/2023   HYPERTHYROIDISM 06/17/2007   Hyperthyroidism    Menopause    Multinodular goiter    OSTEOPOROSIS 04/30/2007   Pneumonia    Stroke [facility])    Wears glasses    Past Surgical History:  Procedure Laterality Date   BACK SURGERY     BREAST EXCISIONAL BIOPSY Left    carotid artery surgery     CATARACT EXTRACTION W/ INTRAOCULAR LENS  IMPLANT, BILATERAL     ENDARTERECTOMY Right 09/29/2019   Procedure: REDO OF RIGHT ENDARTERECTOMY CAROTID;  Surgeon: Maeola Harman, MD;  Location: [facility] OR;  Service: Vascular;  Laterality: Right;   INCISION AND DRAINAGE OF WOUND Right 04/25/2023   Procedure: IRRIGATION AND DEBRIDEMENT HIP WOUND; APPLICATION OF NEGATIVE PRESSURE WOUND VAC;  Surgeon: Samson Frederic, MD;  Location: [facility] ORS;  Service: Orthopedics;  Laterality: Right;  220   NECK SURGERY     OTHER SURGICAL HISTORY     discectomy   TOTAL HIP ARTHROPLASTY Right 02/06/2023   Procedure: ANTERIOR TOTAL HIP ARTHROPLASTY;  Surgeon: Samson Frederic, MD;  Location: [facility] OR;  Service: Orthopedics;  Laterality: Right;   TOTAL KNEE ARTHROPLASTY Left 09/30/2021   Procedure: TOTAL KNEE ARTHROPLASTY;  Surgeon: Ollen Gross, MD;  Location: [facility] ORS;  Service: Orthopedics;  Laterality: Left;   WISDOM TOOTH EXTRACTION     Social History   Socioeconomic History   Marital status: Widowed    Spouse name: Not on file   Number of children: Not on file   Years of education: Not on file   Highest education level: Not on file  Occupational History   Occupation: Teacher    Employer: RETIRED  Tobacco Use   Smoking status: Former    Types: Cigarettes   Smokeless tobacco: Never   Tobacco comments:    "Quit smoking cigarettes in my 40's "  Vaping Use   Vaping status: Never Used  Substance and Sexual Activity   Alcohol use: Yes    Alcohol/week: 0.0 standard drinks of alcohol    Comment: Socially    Drug use: No   Sexual activity: Not on file  Other Topics Concern   Not on file  Social History Narrative   Lives alone.   Social Determinants of Health   Financial Resource Strain: Not on file  Food Insecurity: No Food Insecurity (08/04/2023)   Hunger Vital Sign    Worried About Running Out of Food in the Last Year: Never true    Ran Out of Food in the Last Year: Never true  Transportation Needs: No Transportation Needs (08/04/2023)   PRAPARE - Administrator, Civil Service (Medical): No    Lack of Transportation (Non-Medical): No  Physical Activity: Not on file  Stress: Not on file  Social Connections: Not on file   Family History  Problem Relation Age of Onset   Heart disease Other    Breast cancer Mother 63   Allergies  Allergen Reactions   Cetirizine Other (See Comments)    Stomach pain   Codeine Itching    Severe itching   Hydrocodone Itching   Meperidine Hcl Diarrhea and Nausea And Vomiting    (Demerol)   Nabumetone Rash    (Relafen)   Naproxen Itching and Rash   Sulfa Antibiotics Rash   Sulfonamide Derivatives Rash   Prior to Admission medications   Medication Sig Start Date End Date Taking? Authorizing Provider  acetaminophen (TYLENOL) 325 MG tablet Take 2 tablets (650 mg total) by mouth every 6 (six) hours as needed for fever, headache or mild pain. 02/11/23   Sheikh, Kateri Mc Latif, DO  albuterol (PROVENTIL HFA;VENTOLIN HFA) 108 (90 BASE) MCG/ACT inhaler Inhale 2 puffs into the lungs every 6 (six) hours as needed. Patient taking differently: Inhale 2 puffs into the lungs every 6 (six) hours as needed for wheezing or shortness of breath (Asthma). 04/20/14   Leslye Peer, MD  aspirin EC 81 MG tablet Take 81 mg by mouth daily. Swallow whole.    [provider]  azelastine (ASTELIN) 0.1 % nasal spray Place 2 sprays into both nostrils every evening. Use in each nostril as directed    [provider]  Biotin 5000 MCG CAPS Take 5,000 mcg  by mouth daily.    [provider]  Calcium Carb-Cholecalciferol (CALCIUM 600+D3) 600-20 MG-MCG TABS Take 1 tablet by mouth 2 (two) times daily.    [provider]  diphenhydrAMINE (BENADRYL) 25 MG tablet 25 mg daily as needed for allergies.    [provider]  docusate sodium (COLACE) 100 MG capsule Take 100 mg by mouth daily.    [provider]  ezetimibe-simvastatin (VYTORIN) 10-20 MG per tablet Take 1 tablet by mouth daily after breakfast.    [provider]  fluticasone (FLONASE) 50 MCG/ACT nasal spray Place 2 sprays into both nostrils daily as needed for allergies or rhinitis.    [provider]  hydrocortisone cream 1 % Apply 1 Application topically daily as needed (Ezema). Patient not taking: Reported on 07/15/2023    [provider]  hydroxypropyl methylcellulose / hypromellose (ISOPTO TEARS / GONIOVISC) 2.5 % ophthalmic solution Place 1 drop into both eyes daily.    [provider]  levalbuterol Pauline Aus) 0.63 MG/3ML nebulizer solution Take 3 mLs (0.63 mg total) by nebulization every 4 (four) hours. Patient taking differently: Take 0.63 mg by nebulization every 6 (six) hours as needed for wheezing or shortness of breath. 09/07/15   Rhetta Mura, MD  loratadine (ALLERGY RELIEF) 10 MG tablet Take 10 mg by mouth daily.    [provider]  MAGNESIUM GLUCONATE PO Take 200 mg by mouth daily.    [provider]  methimazole (TAPAZOLE) 10 MG tablet Alternate 5 mg one day and 10 mg the next day 05/04/23   Altamese Oostburg, MD  mirtazapine (REMERON SOL-TAB) 30 MG disintegrating tablet Take 30 mg by mouth at bedtime.    [provider]  montelukast (SINGULAIR) 10 MG tablet Take 10 mg by mouth at bedtime.    [provider]  Multiple Vitamin (MULTIVITAMIN WITH MINERALS) TABS tablet Take 1 tablet by mouth daily.    [provider]  mupirocin ointment (BACTROBAN) 2 % Place 1  Application into the nose 2 (two) times daily. Patient not taking: Reported on 04/21/2023 02/11/23   Marguerita Merles Latif, DO  ondansetron (ZOFRAN) 4 MG tablet Take 1 tablet (4 mg total) by mouth every 6 (six) hours as needed for nausea. Patient not taking: Reported on 07/15/2023 02/11/23   Marguerita Merles Latif, DO  polyethylene glycol (MIRALAX / GLYCOLAX) 17 g packet Take 17 g by mouth 2 (two) times daily. Patient not taking: Reported on 04/21/2023 02/11/23   Marguerita Merles Latif, DO  senna (SENOKOT) 8.6 MG TABS tablet Take 1 tablet (8.6 mg total) by mouth 2 (two) times daily. 02/11/23   Marguerita Merles Latif, DO  sulfamethoxazole-trimethoprim (BACTRIM) 400-80 MG tablet Take 1 tablet by mouth 2 (two) times daily.    [provider]  SYMBICORT 160-4.5 MCG/ACT inhaler Inhale 2 puffs into the lungs 2 (two) times daily. Patient not taking: Reported on 07/15/2023 07/09/15   [provider]  traZODone (DESYREL) 100 MG tablet Take 100 mg by mouth at bedtime. Patient not taking: Reported on 07/15/2023    [provider]  triamcinolone ointment (KENALOG) 0.1 % Apply 1 Application topically daily as needed (unknown).    [provider]   MR Lumbar Spine W Wo Contrast  Result Date: 08/05/2023 CLINICAL DATA:  Patient septic, concern for osteomyelitis; weakness EXAM: MRI LUMBAR SPINE WITHOUT AND WITH CONTRAST TECHNIQUE: Multiplanar and multiecho pulse sequences of the lumbar spine were obtained without and with intravenous contrast. CONTRAST:  5mL GADAVIST GADOBUTROL 1 MMOL/ML IV SOLN COMPARISON:  None Available. FINDINGS: Segmentation:  5 lumbar type vertebral bodies. Alignment: Levocurvature. 2 mm anterolisthesis of L2 on L3. 5 mm anterolisthesis of L3 on L4. 4 mm retrolisthesis of L4 on L5. Vertebrae: No evidence of acute fracture or suspicious osseous lesion. Some enhancement is noted in the disc spaces of L4-L5 and L5-S1 (series 11, image 5). The adjacent endplates at L5-S1 appear  irregular, with increased T2 signal and minimal enhancement (series 11, images 6-9). Degenerative changes are noted at the L4 endplate, with small foci  of increased T2 signal and enhancement (series 11, image 9 and series 7, image 9 Endplate degenerative changes, with increased T2 signal and some contrast enhancement at T11-T12, eccentric the left, L1-L2, eccentric to the right, and L3-L4, eccentric to the left, compatible with edema and inflammation, most likely Modic type 1 endplate changes, without abnormal signal disc spaces. Additional Modic type 3 endplate changes, with low T1 and T2 signal, are noted at L2-L3, eccentric to the right. Conus medullaris and cauda equina: Conus extends to the L1 level. Minimal enhancement in some of the left cauda equina nerve roots at L4 (series 12, image 23 and L5 (series 12, image 30) with some mild nerve root thickening (series 9, image 23). No epidural collection. Paraspinal and other soft tissues: Atrophy of the posterior inferior paraspinous muscles, right psoas, and right iliacus. No lymphadenopathy. Disc levels: T11-T12: Seen only on the sagittal images. Mild disc bulge with left subarticular protrusion. No spinal canal stenosis or neural foraminal narrowing. T12-L1: Mild disc bulge with right foraminal and extreme lateral protrusion. Mild facet arthropathy. Mild facet arthropathy. No spinal canal stenosis or neural foraminal narrowing. L1-L2: Right eccentric disc bulge. Mild facet arthropathy. Narrowing of the right lateral recess. Mild spinal canal stenosis. No neural foraminal narrowing. L2-L3: Mild disc bulge. Mild facet arthropathy. Narrowing of the right-greater-than-left lateral recess. Mild spinal canal stenosis. No neural foraminal narrowing. L3-L4: Anterolisthesis with disc unroofing and moderate disc bulge. Moderate facet arthropathy. Ligamentum flavum hypertrophy. Narrowing of the left-greater-than-right lateral recess. Mild to moderate spinal canal stenosis.  No neural foraminal narrowing. L4-L5: Retrolisthesis and mild disc bulge. Mild facet arthropathy. Narrowing of the lateral recesses. No spinal canal stenosis or neural foraminal narrowing. L5-S1: Mild disc bulge, which may contact the exiting L5 nerve roots, with superimposed central protrusion. Mild-to-moderate facet arthropathy. No spinal canal stenosis. Mild left neural foraminal narrowing. IMPRESSION: 1. Some enhancement in the discs at L4-L5 and L5-S1, with irregularity of the adjacent endplates at L5-S1 and small foci of increased T2 signal and enhancement at the L4 endplate, which could represent early discitis-osteomyelitis versus the sequela of degenerative disc disease. Correlate with ESR and CRP. 2. Minimal enhancement in some of the left cauda equina nerve roots at L4 and L5, with some mild nerve root thickening, suggestive of arachnoiditis. 3. L3-L4 mild-to-moderate spinal canal stenosis. 4. L1-L2 and L2-L3 mild spinal canal stenosis. 5. L1-L2 mild spinal canal stenosis. 6. L5-S1 mild left neural foraminal narrowing. 7. Narrowing of the lateral recesses at L1-L2, L2-L3, L3-L4, and L4-L5 could affect the descending L2, L3, L4, and L5 nerve roots, respectively. Electronically Signed   By: Wiliam Ke M.D.   On: 08/05/2023 01:29   DG Hip Unilat W or Wo Pelvis 2-3 Views Right  Result Date: 08/04/2023 CLINICAL DATA:  Right hip pain EXAM: DG HIP (WITH OR WITHOUT PELVIS) 2-3V RIGHT COMPARISON:  02/09/2023 CT scan FINDINGS: Right hip prosthesis noted with single cerclage wire along the proximal femur. No visible periprosthetic fracture. No acute bony findings. Moderate degenerative left hip arthropathy. Lower lumbar spondylosis and degenerative disc disease. Vascular calcifications. Bony demineralization. IMPRESSION: 1. Right hip prosthesis without visible periprosthetic fracture or acute complicating feature. 2. Moderate degenerative left hip arthropathy. 3. Lower lumbar spondylosis and degenerative  disc disease. 4. Bony demineralization. Electronically Signed   By: Gaylyn Rong M.D.   On: 08/04/2023 20:15   DG Chest Port 1 View  Result Date: 08/04/2023 CLINICAL DATA:  Shortness of breath weakness, fatigue.  Fever. EXAM: PORTABLE CHEST  1 VIEW COMPARISON:  02/07/2023 FINDINGS: Atherosclerotic calcification of the aortic arch. Dextroconvex thoracic scoliosis. The lungs appear clear. No blunting of the costophrenic angles. Heart size within normal limits. Degenerative spurring of both humeral heads. Clips project over the right lower neck. IMPRESSION: 1. No acute findings. 2. Dextroconvex thoracic scoliosis. 3. Aortic Atherosclerosis (ICD10-I70.0). Electronically Signed   By: Gaylyn Rong M.D.   On: 08/04/2023 20:11    Positive ROS: All other systems have been reviewed and were otherwise negative with the exception of those mentioned in the HPI and as above.  Physical Exam: General: Alert, no acute distress Cardiovascular: No pedal edema Respiratory: No cyanosis, no use of accessory musculature GI: No organomegaly, abdomen is soft and non-tender Skin: No lesions in the area of chief complaint Neurologic: Sensation intact distally Psychiatric: Patient is competent for consent with normal mood and affect Lymphatic: No axillary or cervical lymphadenopathy  MUSCULOSKELETAL: Examination of the right hip reveals a benign incision.  In the center, she has 3 nylon sutures.  There is no erythema or drainage.  No fluctuance.  No obvious subcutaneous fluid collection.  She has painless manipulation of the hip.  Neurovascularly intact distally.  Assessment: Sepsis.  MRI lumbar spine consistent with discitis.  Status post right hip hemiarthroplasty for displaced femoral neck fracture, complicated by superficial E. coli infection.  The infection appears resolved.  Plan: I discussed the findings with the patient.  Her hip incision looks great.  I went over her imaging and laboratory data.   The MRI read of the hip is pending.  In order to to definitively rule out her hip joint as a source of infection, I have ordered a fluoroscopic guided right hip aspiration by IR: Please send fluid for stat cell count with differential, Gram stain, aerobic/anaerobic culture.  The MRI of the lumbar spine is suggestive of discitis, which makes sense due to the patient's reported back pain.  Defer treatment to primary team/ID.  I have ordered a sed rate and C-reactive protein this morning.  Will follow right hip aspiration.  Jonette Pesa, MD [phone]    08/05/2023 8:09 AM

## 2023-08-05 NOTE — Procedures (Signed)
Interventional Radiology Procedure Note  Procedure: Right hip joint aspiration yielding 4 mL golden synovial fluid  Complications: None  Estimated Blood Loss: None  Recommendations: - labs sent   Signed,  Sterling Big, MD

## 2023-08-05 NOTE — Progress Notes (Signed)
PROGRESS NOTE    Ariel Lambert  WUJ:[id number] DOB: 12-15-1935 DOA: 08/04/2023 PCP: Mila Palmer, MD  Subjective: Pt seen and examined. Discussed with pt's dtr lisa hill. Pt would likely need SNF at discharge. Dtr wants pt to go to [facility].  Went for right hip aspiration today. ID consulted for abx management.   Hospital Course: HPI:  Ariel Lambert is a 87 y.o. female with medical history significant of medical history as noted below.  Of particular importance is the fact that the patient had femoral fracture back in May that was repaired operatively.  Unfortunately patient postoperative course was complicated with a potential infection requiring a revision and patient has had poor healing of the skin since then.  Patient was last seen by orthopedic surgery yesterday when wound dehiscence site was sutured over.   History is obtained primarily from the daughter at the bedside but also from the patient herself to a good extent.  Patient was in her usual state of health till yesterday evening.  Patient is generally ambulatory with a walker, alert and awake and reasonably intelligently conversant with family members and friends.  She ago she occasionally goes out with friends for lunch etc.   However since this morning patient has been noted to be basically extremely sleepy/lethargic and laying recumbent in bed.  Patient was since discovered to have a temperature of 101 F at home.  Further patient has been complaining of low back pain.  There is no complaint from the patient of ear pain sore throat cough trouble breathing chest pain neck pain. patient has a mild headache but not as severe headache.  There is no report of increased pain from the right hip.  No new rash, although patient is generally flushed.  There is no report of diarrhea or vomiting.     Patient was initially taken to the [facility] at around noon time and cared for by the ER attending Dr. Particia Nearing.   Patient was diagnosed with sepsis has received empiric antibiotics, workup as noted below.  Medical evaluation is sought.   Patient/daughter feel that the patient has generally improved since her initial presentation this morning.  She is more comfortable headache has improved and she is more interactive.  Significant Events: Admitted 08/04/2023 for sepsis with encephalopathy   Significant Labs: Admission WBC 10.6, lactic acid 2.0, BP 87/64(responded to IVF)  Significant Imaging Studies: Admission MRI lumbar spine shows Some enhancement in the discs at L4-L5 and L5-S1, with irregularity of the adjacent endplates at L5-S1 and small foci of increased T2 signal and enhancement at the L4 endplate, which could represent early discitis-osteomyelitis versus the sequela of degenerative disc disease. Correlate with ESR and CRP. 2. Minimal enhancement in some of the left cauda equina nerve roots at L4 and L5, with some mild nerve root thickening, suggestive of arachnoiditis. 3. L3-L4 mild-to-moderate spinal canal stenosis. 4. L1-L2 and L2-L3 mild spinal canal stenosis. 5. L1-L2 mild spinal canal stenosis. 6. L5-S1 mild left neural foraminal narrowing. 7. Narrowing of the lateral recesses at L1-L2, L2-L3, L3-L4, and L4-L5 could affect the descending L2, L3, L4, and L5 nerve roots, respectively.  Antibiotic Therapy: Anti-infectives (From admission, onward)    Start     Dose/Rate Route Frequency Ordered Stop   08/05/23 1745  vancomycin (VANCOREADY) IVPB 750 mg/150 mL        750 mg 150 mL/hr over 60 Minutes Intravenous Every 24 hours 08/04/23 2326     08/05/23 0000  piperacillin-tazobactam (ZOSYN)  IVPB 3.375 g        3.375 g 12.5 mL/hr over 240 Minutes Intravenous Every 8 hours 08/04/23 2259     08/04/23 1515  metroNIDAZOLE (FLAGYL) IVPB 500 mg        500 mg 100 mL/hr over 60 Minutes Intravenous  Once 08/04/23 1505 08/04/23 1748   08/04/23 1515  vancomycin (VANCOCIN) IVPB 1000 mg/200 mL premix         1,000 mg 200 mL/hr over 60 Minutes Intravenous  Once 08/04/23 1508 08/04/23 1847   08/04/23 1515  ceFEPIme (MAXIPIME) 2 g in sodium chloride 0.9 % 100 mL IVPB        2 g 200 mL/hr over 30 Minutes Intravenous  Once 08/04/23 1508 08/04/23 1733       Procedures:   Consultants: orthopedics    Assessment and Plan: * Sepsis with encephalopathy (HCC) 08-04-2023 Although no fever is recorded in the medical record, patient daughter reports a temperature of 101 F at home.  Patient has leukocytosis and patient's blood pressure was soft at initial presentation.  Patient received 1.5 L of fluid and blood pressure seems to have improved mentation seems to have improved.  Patient has gotten lactic acid of 2.0 initially improved.  At this time patient still appears to be dry and I will continue 100 cc an hour of fluid overnight with reevaluation in the morning.  With regards to the focus of infection.  Respiratory panel, limited as negative.  Principal concern is the fact that the patient is complaining of low back pain and obviously has had an open sore in the right hip since May.  I do not appreciate any cross-sectional imaging in the chart of the affected areas thus far.  I think it is time to do cross-sectional imaging of low back and right hip.  To best guide our further treatment.  In the interim I will continue patient on empiric vancomycin and zosyn for suspected osteomyelitis of the sites. Blood cultures pending.  Patient's mentation, per daughter has improved.  Patient did not demonstrate any marked confusion/hallucination or focal deficits to me.  No nuchal rigidity.  I think this is due to metabolic encephalopathy from sepsis we will continue with clinical monitoring in this regard.  08-05-2023 likely due to discitis. Was present on admission. WBC 10.6, lactic acid 2.0. MRI lumbar spine shows discitis. Now resolved.  Lumbar discitis 08-05-2023 MRI lumbar spine shows discitis. Ortho consulted  and order right hip aspiration. Remains on IV zosyn/vanco. Will enlist assistance of ID to guide abx management and duration.  Hyponatremia 08-05-2023 repeat BMP in AM.  Chronic obstructive pulmonary disease (HCC) 08-05-2023 stable.  Hyperthyroidism 08-05-2023 continue methimazole  Essential hypertension 08-05-2023 BP meds on hold due to hypotension yesterday in ER(87/64)       DVT prophylaxis: enoxaparin (LOVENOX) injection 40 mg Start: 08/05/23 0800 SCDs Start: 08/04/23 2210     Code Status: Full Code Family Communication: discussed at bedside with dtr lisa hill Disposition Plan: will likely need SNF. Dtr wants [facility] Reason for continuing need for hospitalization: remains on IV abx.  Objective: Vitals:   08/05/23 0120 08/05/23 0530 08/05/23 0930 08/05/23 1324  BP: (!) 146/78 108/70 107/67 (!) 98/58  Pulse: (!) 109 72 82 74  Resp: 19 17  15   Temp: 99 F (37.2 C) 98 F (36.7 C) 99.1 F (37.3 C) 98.4 F (36.9 C)  TempSrc:   Oral Oral  SpO2: (!) 89% 92%  95%  Weight:  Intake/Output Summary (Last 24 hours) at 08/05/2023 1553 Last data filed at 08/05/2023 1034 Gross per 24 hour  Intake 460.83 ml  Output 300 ml  Net 160.83 ml   Filed Weights   08/04/23 1426  Weight: 56.5 kg    Examination:  Physical Exam Vitals and nursing note reviewed.  Constitutional:      General: She is not in acute distress.    Appearance: She is normal weight. She is not toxic-appearing or diaphoretic.     Comments: Appears chronically ill  HENT:     Head: Normocephalic and atraumatic.     Nose: Nose normal.  Eyes:     General: No scleral icterus. Cardiovascular:     Rate and Rhythm: Normal rate and regular rhythm.     Pulses: Normal pulses.  Pulmonary:     Effort: Pulmonary effort is normal.     Breath sounds: Normal breath sounds.  Abdominal:     General: Bowel sounds are normal. There is no distension.     Palpations: Abdomen is soft.  Skin:    General: Skin  is warm and dry.     Capillary Refill: Capillary refill takes less than 2 seconds.  Neurological:     Mental Status: She is oriented to person, place, and time.     Data Reviewed: I have personally reviewed following labs and imaging studies  CBC: Recent Labs  Lab 08/04/23 1448 08/05/23 0032  WBC 10.6* 7.1  NEUTROABS 9.9*  --   HGB 12.9 12.3  HCT 39.3 38.4  MCV 97.8 100.8*  PLT 243 188   Basic Metabolic Panel: Recent Labs  Lab 08/04/23 1448 08/04/23 2250 08/05/23 0032  NA 138  --  132*  K 4.4  --  3.6  CL 102  --  101  CO2 26  --  23  GLUCOSE 134*  --  101*  BUN 19  --  17  CREATININE 0.65 0.51 0.52  CALCIUM 9.4  --  8.3*   GFR: Estimated Creatinine Clearance: 40.7 mL/min (by C-G formula based on SCr of 0.52 mg/dL). Liver Function Tests: Recent Labs  Lab 08/04/23 1448  AST 15  ALT 19  ALKPHOS 74  BILITOT 0.8  PROT 6.4*  ALBUMIN 3.5   Coagulation Profile: Recent Labs  Lab 08/04/23 1448  INR 1.1   Thyroid Function Tests: Recent Labs    08/05/23 0032  TSH 0.255*   Sepsis Labs: Recent Labs  Lab 08/04/23 1448 08/04/23 1732 08/04/23 2009 08/04/23 2215  LATICACIDVEN 1.8 2.0* 1.6 1.2    Recent Results (from the past 240 hour(s))  Culture, blood (Routine x 2)     Status: None (Preliminary result)   Collection Time: 08/04/23  2:29 PM   Specimen: BLOOD RIGHT ARM  Result Value Ref Range Status   Specimen Description   Final    BLOOD RIGHT ARM Performed at [facility], [address], [place], [place] [address]    Special Requests   Final    Blood Culture adequate volume BOTTLES DRAWN AEROBIC AND ANAEROBIC Performed at [facility], [address] [place], [place] [address]    Culture   Final    NO GROWTH < 24 HOURS Performed at [facility], [address] [place], [place] [address]    Report Status PENDING  Incomplete  Culture, blood (Routine x 2)     Status: None (Preliminary result)   Collection Time:  08/04/23  2:34 PM   Specimen: BLOOD  LEFT ARM  Result Value Ref Range Status   Specimen Description   Final    BLOOD LEFT ARM Performed at [facility], [address] [place], [place] [address]    Special Requests   Final    Blood Culture adequate volume BOTTLES DRAWN AEROBIC AND ANAEROBIC Performed at [facility], [address], [place], [place] [address]    Culture   Final    NO GROWTH < 24 HOURS Performed at [facility], [address] [place], [place] [address]    Report Status PENDING  Incomplete  Resp panel by RT-PCR (RSV, Flu A&B, Covid) Anterior Nasal Swab     Status: None   Collection Time: 08/04/23  4:03 PM   Specimen: Anterior Nasal Swab  Result Value Ref Range Status   SARS Coronavirus 2 by RT PCR NEGATIVE NEGATIVE Final    Comment: (NOTE) SARS-CoV-2 target nucleic acids are NOT DETECTED.  The SARS-CoV-2 RNA is generally detectable in upper respiratory specimens during the acute phase of infection. The lowest concentration of SARS-CoV-2 viral copies this assay can detect is 138 copies/mL. A negative result does not preclude SARS-Cov-2 infection and should not be used as the sole basis for treatment or other patient management decisions. A negative result may occur with  improper specimen collection/handling, submission of specimen other than nasopharyngeal swab, presence of viral mutation(s) within the areas targeted by this assay, and inadequate number of viral copies(<138 copies/mL). A negative result must be combined with clinical observations, patient history, and epidemiological information. The expected result is Negative.  Fact Sheet for Patients:  BloggerCourse.com  Fact Sheet for Healthcare Providers:  SeriousBroker.it  This test is no t yet approved or cleared by the [place] FDA and  has been authorized for detection and/or diagnosis of SARS-CoV-2 by FDA under  an Emergency Use Authorization (EUA). This EUA will remain  in effect (meaning this test can be used) for the duration of the COVID-19 declaration under Section 564(b)(1) of the Act, 21 U.S.C.section 360bbb-3(b)(1), unless the authorization is terminated  or revoked sooner.       Influenza A by PCR NEGATIVE NEGATIVE Final   Influenza B by PCR NEGATIVE NEGATIVE Final    Comment: (NOTE) The Xpert Xpress SARS-CoV-2/FLU/RSV plus assay is intended as an aid in the diagnosis of influenza from Nasopharyngeal swab specimens and should not be used as a sole basis for treatment. Nasal washings and aspirates are unacceptable for Xpert Xpress SARS-CoV-2/FLU/RSV testing.  Fact Sheet for Patients: BloggerCourse.com  Fact Sheet for Healthcare Providers: SeriousBroker.it  This test is not yet approved or cleared by the [place] FDA and has been authorized for detection and/or diagnosis of SARS-CoV-2 by FDA under an Emergency Use Authorization (EUA). This EUA will remain in effect (meaning this test can be used) for the duration of the COVID-19 declaration under Section 564(b)(1) of the Act, 21 U.S.C. section 360bbb-3(b)(1), unless the authorization is terminated or revoked.     Resp Syncytial Virus by PCR NEGATIVE NEGATIVE Final    Comment: (NOTE) Fact Sheet for Patients: BloggerCourse.com  Fact Sheet for Healthcare Providers: SeriousBroker.it  This test is not yet approved or cleared by the [place] FDA and has been authorized for detection and/or diagnosis of SARS-CoV-2 by FDA under an Emergency Use Authorization (EUA). This EUA will remain in effect (meaning this test can be used) for the duration of the COVID-19 declaration under Section 564(b)(1) of the Act, 21 U.S.C. section 360bbb-3(b)(1),  unless the authorization is terminated or revoked.  Performed at [facility], [address] [place], [place] [address]      Radiology Studies: MR Lumbar Spine W Wo Contrast  Result Date: 08/05/2023 CLINICAL DATA:  Patient septic, concern for osteomyelitis; weakness EXAM: MRI LUMBAR SPINE WITHOUT AND WITH CONTRAST TECHNIQUE: Multiplanar and multiecho pulse sequences of the lumbar spine were obtained without and with intravenous contrast. CONTRAST:  5mL GADAVIST GADOBUTROL 1 MMOL/ML IV SOLN COMPARISON:  None Available. FINDINGS: Segmentation:  5 lumbar type vertebral bodies. Alignment: Levocurvature. 2 mm anterolisthesis of L2 on L3. 5 mm anterolisthesis of L3 on L4. 4 mm retrolisthesis of L4 on L5. Vertebrae: No evidence of acute fracture or suspicious osseous lesion. Some enhancement is noted in the disc spaces of L4-L5 and L5-S1 (series 11, image 5). The adjacent endplates at L5-S1 appear irregular, with increased T2 signal and minimal enhancement (series 11, images 6-9). Degenerative changes are noted at the L4 endplate, with small foci of increased T2 signal and enhancement (series 11, image 9 and series 7, image 9 Endplate degenerative changes, with increased T2 signal and some contrast enhancement at T11-T12, eccentric the left, L1-L2, eccentric to the right, and L3-L4, eccentric to the left, compatible with edema and inflammation, most likely Modic type 1 endplate changes, without abnormal signal disc spaces. Additional Modic type 3 endplate changes, with low T1 and T2 signal, are noted at L2-L3, eccentric to the right. Conus medullaris and cauda equina: Conus extends to the L1 level. Minimal enhancement in some of the left cauda equina nerve roots at L4 (series 12, image 23 and L5 (series 12, image 30) with some mild nerve root thickening (series 9, image 23). No epidural collection. Paraspinal and other soft tissues: Atrophy of the posterior inferior paraspinous muscles, right psoas, and right iliacus. No lymphadenopathy. Disc levels: T11-T12: Seen only on  the sagittal images. Mild disc bulge with left subarticular protrusion. No spinal canal stenosis or neural foraminal narrowing. T12-L1: Mild disc bulge with right foraminal and extreme lateral protrusion. Mild facet arthropathy. Mild facet arthropathy. No spinal canal stenosis or neural foraminal narrowing. L1-L2: Right eccentric disc bulge. Mild facet arthropathy. Narrowing of the right lateral recess. Mild spinal canal stenosis. No neural foraminal narrowing. L2-L3: Mild disc bulge. Mild facet arthropathy. Narrowing of the right-greater-than-left lateral recess. Mild spinal canal stenosis. No neural foraminal narrowing. L3-L4: Anterolisthesis with disc unroofing and moderate disc bulge. Moderate facet arthropathy. Ligamentum flavum hypertrophy. Narrowing of the left-greater-than-right lateral recess. Mild to moderate spinal canal stenosis. No neural foraminal narrowing. L4-L5: Retrolisthesis and mild disc bulge. Mild facet arthropathy. Narrowing of the lateral recesses. No spinal canal stenosis or neural foraminal narrowing. L5-S1: Mild disc bulge, which may contact the exiting L5 nerve roots, with superimposed central protrusion. Mild-to-moderate facet arthropathy. No spinal canal stenosis. Mild left neural foraminal narrowing. IMPRESSION: 1. Some enhancement in the discs at L4-L5 and L5-S1, with irregularity of the adjacent endplates at L5-S1 and small foci of increased T2 signal and enhancement at the L4 endplate, which could represent early discitis-osteomyelitis versus the sequela of degenerative disc disease. Correlate with ESR and CRP. 2. Minimal enhancement in some of the left cauda equina nerve roots at L4 and L5, with some mild nerve root thickening, suggestive of arachnoiditis. 3. L3-L4 mild-to-moderate spinal canal stenosis. 4. L1-L2 and L2-L3 mild spinal canal stenosis. 5. L1-L2 mild spinal canal stenosis. 6. L5-S1 mild left neural foraminal narrowing. 7. Narrowing of the lateral recesses at L1-L2,  L2-L3, L3-L4, and L4-L5 could affect the descending L2, L3, L4, and L5 nerve roots, respectively. Electronically Signed   By: Wiliam Ke M.D.   On: 08/05/2023 01:29   DG Hip Unilat W or Wo Pelvis 2-3 Views Right  Result Date: 08/04/2023 CLINICAL DATA:  Right hip pain EXAM: DG HIP (WITH OR WITHOUT PELVIS) 2-3V RIGHT COMPARISON:  02/09/2023 CT scan FINDINGS: Right hip prosthesis noted with single cerclage wire along the proximal femur. No visible periprosthetic fracture. No acute bony findings. Moderate degenerative left hip arthropathy. Lower lumbar spondylosis and degenerative disc disease. Vascular calcifications. Bony demineralization. IMPRESSION: 1. Right hip prosthesis without visible periprosthetic fracture or acute complicating feature. 2. Moderate degenerative left hip arthropathy. 3. Lower lumbar spondylosis and degenerative disc disease. 4. Bony demineralization. Electronically Signed   By: Gaylyn Rong M.D.   On: 08/04/2023 20:15   DG Chest Port 1 View  Result Date: 08/04/2023 CLINICAL DATA:  Shortness of breath weakness, fatigue.  Fever. EXAM: PORTABLE CHEST 1 VIEW COMPARISON:  02/07/2023 FINDINGS: Atherosclerotic calcification of the aortic arch. Dextroconvex thoracic scoliosis. The lungs appear clear. No blunting of the costophrenic angles. Heart size within normal limits. Degenerative spurring of both humeral heads. Clips project over the right lower neck. IMPRESSION: 1. No acute findings. 2. Dextroconvex thoracic scoliosis. 3. Aortic Atherosclerosis (ICD10-I70.0). Electronically Signed   By: Gaylyn Rong M.D.   On: 08/04/2023 20:11    Scheduled Meds:  acetaminophen  650 mg Oral Q4H while awake   aspirin EC  81 mg Oral Daily   enoxaparin (LOVENOX) injection  40 mg Subcutaneous Q24H   ezetimibe  10 mg Oral Daily   And   simvastatin  20 mg Oral Daily   methIMAzole  10 mg Oral Daily   mirtazapine  30 mg Oral QHS   mometasone-formoterol  2 puff Inhalation BID    montelukast  10 mg Oral QHS   sodium chloride flush  3 mL Intravenous Q12H   Continuous Infusions:  piperacillin-tazobactam (ZOSYN)  IV 3.375 g (08/05/23 0857)   vancomycin       LOS: 1 day   Time spent: 45 minutes  Carollee Herter, DO  [facility]  08/05/2023, 3:53 PM

## 2023-08-05 NOTE — Consult Note (Addendum)
[facility]  Total days of antibiotics 2       Reason for Consult:discitis    Referring Physician:chen  Principal Problem:   Sepsis with encephalopathy [facility]) Active Problems:   Essential hypertension   Hyperthyroidism   Chronic obstructive pulmonary disease (HCC)   Lumbar discitis   Hyponatremia    HPI: Ariel Lambert is a 87 y.o. female with htn, copd, who had right femoral neck fracture s/p hemiarthroplasty in may 2024 complicated with ecoli superficial wound infection s/p I xD  in August 2024 who was seen in orthopedics office for wound dehiscence on 11/18, she continued to feel poorly with lethargy and fever up to !)!F which prompted her to be admitted on 11/19. She complained of low back also noted on physical exam. Labs significant for mild leukocytosis of 10.6. mri of spine shows L4-S1 early discitis. She was empirically started on vanco/piptazo. Due to concern for hip infection, she had MRI of hip also done, which is pending.  Wound cx in August: pan sensitive ecoli. Discharged on 14d of levoflox,previously had been on cefadroxil  Past Medical History:  Diagnosis Date   Allergy    Arthritis    ASTHMA 05/03/2009   Asthma    Carotid stenosis    Chronic kidney disease    " in remission"   COPD (chronic obstructive pulmonary disease) (HCC)    CVA (cerebral infarction)    Dyslipidemia    Headache    Hearing aid worn    B/L   HYPERTENSION 04/30/2007   off of meds since  01/2023   HYPERTHYROIDISM 06/17/2007   Hyperthyroidism    Menopause    Multinodular goiter    OSTEOPOROSIS 04/30/2007   Pneumonia    Stroke [facility])    Surgical site infection 04/23/2023   Thyrotoxicosis 06/17/2007   Qualifier: Diagnosis of   By: Everardo All MD, Gregary Signs A        Wears glasses     Allergies:  Allergies  Allergen Reactions   Cetirizine Other (See Comments)    Stomach pain   Codeine Itching    Severe itching   Hydrocodone Itching   Meperidine Hcl  Diarrhea and Nausea And Vomiting    (Demerol)   Nabumetone Rash    (Relafen)   Naproxen Itching and Rash   Sulfa Antibiotics Rash   Sulfonamide Derivatives Rash    Current antibiotics:   MEDICATIONS:  acetaminophen  650 mg Oral Q4H while awake   aspirin EC  81 mg Oral Daily   enoxaparin (LOVENOX) injection  40 mg Subcutaneous Q24H   ezetimibe  10 mg Oral Daily   And   simvastatin  20 mg Oral Daily   methIMAzole  10 mg Oral Daily   mirtazapine  30 mg Oral QHS   mometasone-formoterol  2 puff Inhalation BID   montelukast  10 mg Oral QHS   sodium chloride flush  3 mL Intravenous Q12H    Social History   Tobacco Use   Smoking status: Former    Types: Cigarettes   Smokeless tobacco: Never   Tobacco comments:    "Quit smoking cigarettes in my 40's "  Vaping Use   Vaping status: Never Used  Substance Use Topics   Alcohol use: Yes    Alcohol/week: 0.0 standard drinks of alcohol    Comment: Socially   Drug use: No    Family History  Problem Relation Age of Onset   Heart disease Other    Breast  cancer Mother 13    Review of Systems -   Constitutional: Negative for fever, chills, diaphoresis, activity change, appetite change, fatigue and unexpected weight change.  HENT: Negative for congestion, sore throat, rhinorrhea, sneezing, trouble swallowing and sinus pressure.  Eyes: Negative for photophobia and visual disturbance.  Respiratory: Negative for cough, chest tightness, shortness of breath, wheezing and stridor.  Cardiovascular: Negative for chest pain, palpitations and leg swelling.  Gastrointestinal: Negative for nausea, vomiting, abdominal pain, diarrhea, constipation, blood in stool, abdominal distention and anal bleeding.  Genitourinary: Negative for dysuria, hematuria, flank pain and difficulty urinating.  Musculoskeletal: mild back pain. Negative for myalgias, back pain, joint swelling, arthralgias and gait problem.  Skin: Negative for color change, pallor,  rash and wound.  Neurological: Negative for dizziness, tremors, weakness and light-headedness.  Hematological: Negative for adenopathy. Does not bruise/bleed easily.  Psychiatric/Behavioral: Negative for behavioral problems, confusion, sleep disturbance, dysphoric mood, decreased concentration and agitation.     OBJECTIVE: Temp:  [98 F (36.7 C)-99.1 F (37.3 C)] 98.4 F (36.9 C) (11/20 1324) Pulse Rate:  [72-109] 74 (11/20 1324) Resp:  [11-20] 15 (11/20 1324) BP: (98-146)/(58-88) 98/58 (11/20 1324) SpO2:  [89 %-97 %] 95 % (11/20 1324) Physical Exam  Constitutional:  oriented to person, only. appears well-developed and well-nourished. No distress.  HENT: [place]/AT, PERRLA, no scleral icterus Mouth/Throat: Oropharynx is clear and moist. No oropharyngeal exudate.  Cardiovascular: Normal rate, regular rhythm and normal heart sounds. Exam reveals no gallop and no friction rub.  No murmur heard.  Pulmonary/Chest: Effort normal and breath sounds normal. No respiratory distress.  has no wheezes.  Neck = supple, no nuchal rigidity Abdominal: Soft. Bowel sounds are normal.  exhibits no distension. There is no tenderness.  Hip= stitch to incision but no surrounding erythema or drainage Lymphadenopathy: no cervical adenopathy. No axillary adenopathy Neurological: alert and oriented to person, place, and time.  Skin: Skin is warm and dry. No rash noted. No erythema.  Psychiatric: a normal mood and affect.  behavior is normal.    LABS: Results for orders placed or performed during the hospital encounter of 08/04/23 (from the past 48 hour(s))  Culture, blood (Routine x 2)     Status: None (Preliminary result)   Collection Time: 08/04/23  2:29 PM   Specimen: BLOOD RIGHT ARM  Result Value Ref Range   Specimen Description      BLOOD RIGHT ARM Performed at [facility], [address] [place], [place] [address]    Special Requests      Blood Culture adequate volume BOTTLES DRAWN AEROBIC  AND ANAEROBIC Performed at [facility], [address], [place], [place] [address]    Culture      NO GROWTH < 24 HOURS Performed at [facility], [address], [place], [place] [address]    Report Status PENDING   Culture, blood (Routine x 2)     Status: None (Preliminary result)   Collection Time: 08/04/23  2:34 PM   Specimen: BLOOD LEFT ARM  Result Value Ref Range   Specimen Description      BLOOD LEFT ARM Performed at [facility], [address] [place], [place] [address]    Special Requests      Blood Culture adequate volume BOTTLES DRAWN AEROBIC AND ANAEROBIC Performed at [facility], [address] [place], [place] [address]    Culture      NO GROWTH < 24 HOURS Performed at [facility], [address] [place], [place] [address]    Report Status PENDING   Comprehensive metabolic panel     Status: Abnormal   Collection Time: 08/04/23  2:48 PM  Result Value Ref Range   Sodium 138 135 - 145 mmol/L   Potassium 4.4 3.5 - 5.1 mmol/L   Chloride 102 98 - 111 mmol/L   CO2 26 22 - 32 mmol/L   Glucose, Bld 134 (H) 70 - 99 mg/dL    Comment: Glucose reference range applies only to samples taken after fasting for at least 8 hours.   BUN 19 8 - 23 mg/dL   Creatinine, Ser 6.04 0.44 - 1.00 mg/dL   Calcium 9.4 8.9 - 54.0 mg/dL   Total Protein 6.4 (L) 6.5 - 8.1 g/dL   Albumin 3.5 3.5 - 5.0 g/dL   AST 15 15 - 41 U/L   ALT 19 0 - 44 U/L   Alkaline Phosphatase 74 38 - 126 U/L   Total Bilirubin 0.8 <1.2 mg/dL   GFR, Estimated >98 >11 mL/min    Comment: (NOTE) Calculated using the CKD-EPI Creatinine Equation (2021)    Anion gap 10 5 - 15    Comment: Performed at [facility], [address], [place], [place] [address]  Lactic acid, plasma     Status: None   Collection Time: 08/04/23  2:48 PM  Result Value Ref Range   Lactic Acid, Venous 1.8 0.5 - 1.9 mmol/L    Comment: Performed at [facility], [address] [place], [place] [address]  CBC with Differential     Status: Abnormal   Collection Time: 08/04/23  2:48 PM  Result Value Ref Range   WBC 10.6 (H) 4.0 - 10.5 K/uL   RBC 4.02 3.87 - 5.11 MIL/uL   Hemoglobin 12.9 12.0 - 15.0 g/dL   HCT 13.0 86.5 - 78.4 %   MCV 97.8 80.0 - 100.0 fL   MCH 32.1 26.0 - 34.0 pg   MCHC 32.8 30.0 - 36.0 g/dL   RDW 69.6 (H) 29.5 - 28.4 %   Platelets 243 150 - 400 K/uL   nRBC 0.0 0.0 - 0.2 %   Neutrophils Relative % 94 %   Neutro Abs 9.9 (H) 1.7 - 7.7 K/uL   Lymphocytes Relative 1 %   Lymphs Abs 0.1 (L) 0.7 - 4.0 K/uL   Monocytes Relative 5 %   Monocytes Absolute 0.5 0.1 - 1.0 K/uL   Eosinophils Relative 0 %   Eosinophils Absolute 0.0 0.0 - 0.5 K/uL   Basophils Relative 0 %   Basophils Absolute 0.0 0.0 - 0.1 K/uL   Immature Granulocytes 0 %   Abs Immature Granulocytes 0.04 0.00 - 0.07 K/uL    Comment: Performed at [facility], [address] [place], [place] [address]  Protime-INR     Status: None   Collection Time: 08/04/23  2:48 PM  Result Value Ref Range   Prothrombin Time 14.5 11.4 - 15.2 seconds   INR 1.1 0.8 - 1.2    Comment: (NOTE) INR goal varies based on device and disease states. Performed at [facility], [address] [place], [place] [address]   Resp panel by RT-PCR (RSV, Flu A&B, Covid) Anterior Nasal Swab     Status: None   Collection Time: 08/04/23  4:03 PM   Specimen: Anterior Nasal Swab  Result Value Ref Range   SARS Coronavirus 2 by RT PCR NEGATIVE NEGATIVE    Comment: (NOTE) SARS-CoV-2  target nucleic acids are NOT DETECTED.  The SARS-CoV-2 RNA is generally detectable in upper respiratory specimens during the acute phase of infection. The lowest concentration of SARS-CoV-2 viral copies this assay can detect is 138 copies/mL. A negative result does not preclude SARS-Cov-2 infection and should not be used as the sole basis for treatment  or other patient management decisions. A negative result may occur with  improper specimen collection/handling, submission of specimen other than nasopharyngeal swab, presence of viral mutation(s) within the areas targeted by this assay, and inadequate number of viral copies(<138 copies/mL). A negative result must be combined with clinical observations, patient history, and epidemiological information. The expected result is Negative.  Fact Sheet for Patients:  BloggerCourse.com  Fact Sheet for Healthcare Providers:  SeriousBroker.it  This test is no t yet approved or cleared by the [place] FDA and  has been authorized for detection and/or diagnosis of SARS-CoV-2 by FDA under an Emergency Use Authorization (EUA). This EUA will remain  in effect (meaning this test can be used) for the duration of the COVID-19 declaration under Section 564(b)(1) of the Act, 21 U.S.C.section 360bbb-3(b)(1), unless the authorization is terminated  or revoked sooner.       Influenza A by PCR NEGATIVE NEGATIVE   Influenza B by PCR NEGATIVE NEGATIVE    Comment: (NOTE) The Xpert Xpress SARS-CoV-2/FLU/RSV plus assay is intended as an aid in the diagnosis of influenza from Nasopharyngeal swab specimens and should not be used as a sole basis for treatment. Nasal washings and aspirates are unacceptable for Xpert Xpress SARS-CoV-2/FLU/RSV testing.  Fact Sheet for Patients: BloggerCourse.com  Fact Sheet for Healthcare Providers: SeriousBroker.it  This test is not yet approved or cleared by the [place] FDA and has been authorized for detection and/or diagnosis of SARS-CoV-2 by FDA under an Emergency Use Authorization (EUA). This EUA will remain in effect (meaning this test can be used) for the duration of the COVID-19 declaration under Section 564(b)(1) of the Act, 21 U.S.C. section  360bbb-3(b)(1), unless the authorization is terminated or revoked.     Resp Syncytial Virus by PCR NEGATIVE NEGATIVE    Comment: (NOTE) Fact Sheet for Patients: BloggerCourse.com  Fact Sheet for Healthcare Providers: SeriousBroker.it  This test is not yet approved or cleared by the [place] FDA and has been authorized for detection and/or diagnosis of SARS-CoV-2 by FDA under an Emergency Use Authorization (EUA). This EUA will remain in effect (meaning this test can be used) for the duration of the COVID-19 declaration under Section 564(b)(1) of the Act, 21 U.S.C. section 360bbb-3(b)(1), unless the authorization is terminated or revoked.  Performed at [facility], [address], [place], [place] [address]   Lactic acid, plasma     Status: Abnormal   Collection Time: 08/04/23  5:32 PM  Result Value Ref Range   Lactic Acid, Venous 2.0 (HH) 0.5 - 1.9 mmol/L    Comment: CRITICAL RESULT CALLED TO, READ BACK BY AND VERIFIED WITH: AMANDA CALLIHAM RN,08/04/2023 @ 1814 BY JYANG Performed at [facility], [address] [place], [place] [address]   Urinalysis, w/ Reflex to Culture (Infection Suspected) -Urine, Clean Catch     Status: Abnormal   Collection Time: 08/04/23  5:32 PM  Result Value Ref Range   Specimen Source URINE, CATHETERIZED    Color, Urine YELLOW YELLOW   APPearance CLEAR CLEAR   Specific Gravity, Urine 1.031 (H) 1.005 - 1.030   pH 7.0 5.0 - 8.0   Glucose, UA NEGATIVE  NEGATIVE mg/dL   Hgb urine dipstick NEGATIVE NEGATIVE   Bilirubin Urine NEGATIVE NEGATIVE   Ketones, ur NEGATIVE NEGATIVE mg/dL   Protein, ur 30 (A) NEGATIVE mg/dL   Nitrite NEGATIVE NEGATIVE   Leukocytes,Ua NEGATIVE NEGATIVE   RBC / HPF 0-5 0 - 5 RBC/hpf   WBC, UA 0-5 0 - 5 WBC/hpf    Comment:        Reflex urine culture not performed if WBC <=10, OR if Squamous epithelial cells >5. If Squamous  epithelial cells >5 suggest recollection.    Bacteria, UA NONE SEEN NONE SEEN   Squamous Epithelial / HPF 0-5 0 - 5 /HPF   Mucus PRESENT     Comment: Performed at [facility], [address] [place], [place] [address]  Lactic acid, plasma     Status: None   Collection Time: 08/04/23  8:09 PM  Result Value Ref Range   Lactic Acid, Venous 1.6 0.5 - 1.9 mmol/L    Comment: Performed at [facility], [address] [place], [place] [address]  Lactic acid, plasma     Status: None   Collection Time: 08/04/23 10:15 PM  Result Value Ref Range   Lactic Acid, Venous 1.2 0.5 - 1.9 mmol/L    Comment: Performed at [facility], [address], [place], [place] [address]  Troponin I (High Sensitivity)     Status: None   Collection Time: 08/04/23 10:15 PM  Result Value Ref Range   Troponin I (High Sensitivity) 9 <18 ng/L    Comment: (NOTE) Elevated high sensitivity troponin I (hsTnI) values and significant  changes across serial measurements may suggest ACS but many other  chronic and acute conditions are known to elevate hsTnI results.  Refer to the "Links" section for chest pain algorithms and additional  guidance. Performed at [facility], [address] [place], [place] [address]   Creatinine, serum     Status: None   Collection Time: 08/04/23 10:50 PM  Result Value Ref Range   Creatinine, Ser 0.51 0.44 - 1.00 mg/dL   GFR, Estimated >29 >56 mL/min    Comment: (NOTE) Calculated using the CKD-EPI Creatinine Equation (2021) Performed at [facility], [address], [place], [place] [address]   TSH     Status: Abnormal   Collection Time: 08/05/23 12:32 AM  Result Value Ref Range   TSH 0.255 (L) 0.350 - 4.500 uIU/mL    Comment: Performed by a 3rd Generation assay with a functional sensitivity of <=0.01 uIU/mL. Performed at [facility], [address]  [place], [place] [address]   APTT     Status: None   Collection Time: 08/05/23 12:32 AM  Result Value Ref Range   aPTT 26 24 - 36 seconds    Comment: Performed at [facility], [address], [place], [place] [address]  Basic metabolic panel     Status: Abnormal   Collection Time: 08/05/23 12:32 AM  Result Value Ref Range   Sodium 132 (L) 135 - 145 mmol/L   Potassium 3.6 3.5 - 5.1 mmol/L   Chloride 101 98 - 111 mmol/L   CO2 23 22 - 32 mmol/L   Glucose, Bld 101 (H) 70 - 99 mg/dL    Comment: Glucose reference range applies only to samples taken after fasting for at least 8 hours.   BUN 17 8 - 23 mg/dL   Creatinine, Ser 5.28 0.44 - 1.00 mg/dL   Calcium  8.3 (L) 8.9 - 10.3 mg/dL   GFR, Estimated >66 >06 mL/min    Comment: (NOTE) Calculated using the CKD-EPI Creatinine Equation (2021)    Anion gap 8 5 - 15    Comment: Performed at [facility], [address], [place], [place] [address]  CBC     Status: Abnormal   Collection Time: 08/05/23 12:32 AM  Result Value Ref Range   WBC 7.1 4.0 - 10.5 K/uL   RBC 3.81 (L) 3.87 - 5.11 MIL/uL   Hemoglobin 12.3 12.0 - 15.0 g/dL   HCT 10.9 32.3 - 55.7 %   MCV 100.8 (H) 80.0 - 100.0 fL   MCH 32.3 26.0 - 34.0 pg   MCHC 32.0 30.0 - 36.0 g/dL   RDW 32.2 (H) 02.5 - 42.7 %   Platelets 188 150 - 400 K/uL   nRBC 0.0 0.0 - 0.2 %    Comment: Performed at [facility] [address] [place], [place] [address]  Sedimentation rate     Status: None   Collection Time: 08/05/23  8:25 AM  Result Value Ref Range   Sed Rate 17 0 - 22 mm/hr    Comment: Performed at [facility], [address], [place], [place] [address]  C-reactive protein     Status: Abnormal   Collection Time: 08/05/23  8:25 AM  Result Value Ref Range   CRP 10.2 (H) <1.0 mg/dL    Comment: Performed at [facility], [address], [place], [place] [address]    MICRO: Blood cx Right hip aspirate  pending IMAGING: MR Lumbar Spine W Wo Contrast  Result Date: 08/05/2023 CLINICAL DATA:  Patient septic, concern for osteomyelitis; weakness EXAM: MRI LUMBAR SPINE WITHOUT AND WITH CONTRAST TECHNIQUE: Multiplanar and multiecho pulse sequences of the lumbar spine were obtained without and with intravenous contrast. CONTRAST:  5mL GADAVIST GADOBUTROL 1 MMOL/ML IV SOLN COMPARISON:  None Available. FINDINGS: Segmentation:  5 lumbar type vertebral bodies. Alignment: Levocurvature. 2 mm anterolisthesis of L2 on L3. 5 mm anterolisthesis of L3 on L4. 4 mm retrolisthesis of L4 on L5. Vertebrae: No evidence of acute fracture or suspicious osseous lesion. Some enhancement is noted in the disc spaces of L4-L5 and L5-S1 (series 11, image 5). The adjacent endplates at L5-S1 appear irregular, with increased T2 signal and minimal enhancement (series 11, images 6-9). Degenerative changes are noted at the L4 endplate, with small foci of increased T2 signal and enhancement (series 11, image 9 and series 7, image 9 Endplate degenerative changes, with increased T2 signal and some contrast enhancement at T11-T12, eccentric the left, L1-L2, eccentric to the right, and L3-L4, eccentric to the left, compatible with edema and inflammation, most likely Modic type 1 endplate changes, without abnormal signal disc spaces. Additional Modic type 3 endplate changes, with low T1 and T2 signal, are noted at L2-L3, eccentric to the right. Conus medullaris and cauda equina: Conus extends to the L1 level. Minimal enhancement in some of the left cauda equina nerve roots at L4 (series 12, image 23 and L5 (series 12, image 30) with some mild nerve root thickening (series 9, image 23). No epidural collection. Paraspinal and other soft tissues: Atrophy of the posterior inferior paraspinous muscles, right psoas, and right iliacus. No lymphadenopathy. Disc levels: T11-T12: Seen only on the sagittal images. Mild disc bulge with left subarticular protrusion.  No spinal canal stenosis or neural foraminal narrowing. T12-L1: Mild disc bulge with right foraminal and extreme lateral  protrusion. Mild facet arthropathy. Mild facet arthropathy. No spinal canal stenosis or neural foraminal narrowing. L1-L2: Right eccentric disc bulge. Mild facet arthropathy. Narrowing of the right lateral recess. Mild spinal canal stenosis. No neural foraminal narrowing. L2-L3: Mild disc bulge. Mild facet arthropathy. Narrowing of the right-greater-than-left lateral recess. Mild spinal canal stenosis. No neural foraminal narrowing. L3-L4: Anterolisthesis with disc unroofing and moderate disc bulge. Moderate facet arthropathy. Ligamentum flavum hypertrophy. Narrowing of the left-greater-than-right lateral recess. Mild to moderate spinal canal stenosis. No neural foraminal narrowing. L4-L5: Retrolisthesis and mild disc bulge. Mild facet arthropathy. Narrowing of the lateral recesses. No spinal canal stenosis or neural foraminal narrowing. L5-S1: Mild disc bulge, which may contact the exiting L5 nerve roots, with superimposed central protrusion. Mild-to-moderate facet arthropathy. No spinal canal stenosis. Mild left neural foraminal narrowing. IMPRESSION: 1. Some enhancement in the discs at L4-L5 and L5-S1, with irregularity of the adjacent endplates at L5-S1 and small foci of increased T2 signal and enhancement at the L4 endplate, which could represent early discitis-osteomyelitis versus the sequela of degenerative disc disease. Correlate with ESR and CRP. 2. Minimal enhancement in some of the left cauda equina nerve roots at L4 and L5, with some mild nerve root thickening, suggestive of arachnoiditis. 3. L3-L4 mild-to-moderate spinal canal stenosis. 4. L1-L2 and L2-L3 mild spinal canal stenosis. 5. L1-L2 mild spinal canal stenosis. 6. L5-S1 mild left neural foraminal narrowing. 7. Narrowing of the lateral recesses at L1-L2, L2-L3, L3-L4, and L4-L5 could affect the descending L2, L3, L4, and L5  nerve roots, respectively. Electronically Signed   By: Wiliam Ke M.D.   On: 08/05/2023 01:29   DG Hip Unilat W or Wo Pelvis 2-3 Views Right  Result Date: 08/04/2023 CLINICAL DATA:  Right hip pain EXAM: DG HIP (WITH OR WITHOUT PELVIS) 2-3V RIGHT COMPARISON:  02/09/2023 CT scan FINDINGS: Right hip prosthesis noted with single cerclage wire along the proximal femur. No visible periprosthetic fracture. No acute bony findings. Moderate degenerative left hip arthropathy. Lower lumbar spondylosis and degenerative disc disease. Vascular calcifications. Bony demineralization. IMPRESSION: 1. Right hip prosthesis without visible periprosthetic fracture or acute complicating feature. 2. Moderate degenerative left hip arthropathy. 3. Lower lumbar spondylosis and degenerative disc disease. 4. Bony demineralization. Electronically Signed   By: Gaylyn Rong M.D.   On: 08/04/2023 20:15   DG Chest Port 1 View  Result Date: 08/04/2023 CLINICAL DATA:  Shortness of breath weakness, fatigue.  Fever. EXAM: PORTABLE CHEST 1 VIEW COMPARISON:  02/07/2023 FINDINGS: Atherosclerotic calcification of the aortic arch. Dextroconvex thoracic scoliosis. The lungs appear clear. No blunting of the costophrenic angles. Heart size within normal limits. Degenerative spurring of both humeral heads. Clips project over the right lower neck. IMPRESSION: 1. No acute findings. 2. Dextroconvex thoracic scoliosis. 3. Aortic Atherosclerosis (ICD10-I70.0). Electronically Signed   By: Gaylyn Rong M.D.   On: 08/04/2023 20:11    HISTORICAL MICRO/IMAGING  Assessment/Plan:  87yo F with lumbar-sacral early discitis   - recommend to change to daptomycin and ceftriaxone to minimize aki - will follow culture results from today's hip aspirate and mri of hip to see if she also has septic arthritis though on exam it looks well healed no surrounding erythema  - recommend to get TTE if she is bacteremic - for the time being plan to treat for  6 wk of iv abtx - will check ck since starting daptomycin Adalind Weitz B. Drue Second MD MPH [facility] [phone]

## 2023-08-05 NOTE — Subjective & Objective (Addendum)
Pt seen and examined.  Right hip synovial fluid cx negative at 72 hours Blood cx negative at 96 hours  Family still planning on taking pt to [place] at discharge.  Dtr lisa at bedside today.  Plan to insert foley on Sunday night. Will get Monday AM dose of IV abx early. And then DC from hospital.

## 2023-08-05 NOTE — Assessment & Plan Note (Addendum)
08-05-2023 BP meds on hold due to hypotension yesterday in ER(87/64)  08-06-2023 BP elevated this AM. Will restart ARB today.  08-07-2023 BP improved after starting ARB. Will check CMP in AM.  08-08-2023 BUN 16, scr 0.5. avapro 75 mg daily started yesterday. BP has improvedl

## 2023-08-06 ENCOUNTER — Other Ambulatory Visit: Payer: Self-pay

## 2023-08-06 DIAGNOSIS — A419 Sepsis, unspecified organism: Secondary | ICD-10-CM | POA: Diagnosis not present

## 2023-08-06 DIAGNOSIS — M4646 Discitis, unspecified, lumbar region: Secondary | ICD-10-CM | POA: Diagnosis not present

## 2023-08-06 DIAGNOSIS — J449 Chronic obstructive pulmonary disease, unspecified: Secondary | ICD-10-CM | POA: Diagnosis not present

## 2023-08-06 DIAGNOSIS — M4627 Osteomyelitis of vertebra, lumbosacral region: Secondary | ICD-10-CM | POA: Diagnosis not present

## 2023-08-06 DIAGNOSIS — E871 Hypo-osmolality and hyponatremia: Secondary | ICD-10-CM | POA: Diagnosis not present

## 2023-08-06 LAB — BASIC METABOLIC PANEL
Anion gap: 6 (ref 5–15)
BUN: 11 mg/dL (ref 8–23)
CO2: 24 mmol/L (ref 22–32)
Calcium: 8.3 mg/dL — ABNORMAL LOW (ref 8.9–10.3)
Chloride: 109 mmol/L (ref 98–111)
Creatinine, Ser: 0.46 mg/dL (ref 0.44–1.00)
GFR, Estimated: >60 mL/min (ref >60)
Glucose, Bld: 86 mg/dL (ref 70–99)
Potassium: 3.4 mmol/L — ABNORMAL LOW (ref 3.5–5.1)
Sodium: 139 mmol/L (ref 135–145)

## 2023-08-06 LAB — CREATININE, SERUM
Creatinine, Ser: 0.46 mg/dL (ref 0.44–1.00)
GFR, Estimated: >60 mL/min (ref >60)

## 2023-08-06 MED ORDER — SODIUM CHLORIDE 0.9% FLUSH
10.0000 mL | Freq: Two times a day (BID) | INTRAVENOUS | Status: DC
  Administered 2023-08-06 – 2023-08-09 (×6): 10 mL

## 2023-08-06 MED ORDER — SODIUM CHLORIDE 0.9% FLUSH: 10.0000 mL | INTRAVENOUS | Status: DC | PRN

## 2023-08-06 MED ORDER — IRBESARTAN 75 MG PO TABS
75.0000 mg | ORAL_TABLET | Freq: Every day | ORAL | Status: DC
  Administered 2023-08-06 – 2023-08-10 (×5): 75 mg via ORAL
  Filled 2023-08-06 (×5): qty 1

## 2023-08-06 MED ORDER — CHLORHEXIDINE GLUCONATE CLOTH 2 % EX PADS
6.0000 | MEDICATED_PAD | Freq: Every day | CUTANEOUS | Status: DC
  Administered 2023-08-07 – 2023-08-09 (×3): 6 via TOPICAL

## 2023-08-06 NOTE — Evaluation (Signed)
Physical Therapy Evaluation Patient Details Name: Ariel Lambert MRN: [id number] DOB: 16-Apr-1936 Today's Date: 08/06/2023  History of Present Illness  87 yo female admitted 08/04/23 with fever, AMS,,confusion. Patient has had ongoing right hip  infections. PMH: R hip hemiarthroplasty anterior approach on 02/06/23 , S/P Iand D  right hip,HTN, hyper thyroidism, COPD, STroke.  MRI -Some enhancement in the discs at L4-L5 and L5-S1, with  irregularity of the adjacent endplates at L5-S1 and small foci of  increased T2 signal and enhancement at the L4 endplate, which could  represent early discitis-osteomyelitis versus the sequela of  degenerative disc disease  Clinical Impression  Pt admitted with above diagnosis.  Pt currently with functional limitations due to the deficits listed below (see PT Problem List). Pt will benefit from acute skilled PT to increase their independence and safety with mobility to allow discharge.   The patient presents with significant gait deviation, ataxic like. Patient took  ~ 4 small slow steps using RW, then began to have decreased control of the Legs and her trunk would sway backwards, therapist required to provide max support  to maintain balance. Attempted ambulation 2 more times with RW x ~ 4' with similar presentation of ataxia-like movements.  Patient reports ambulatory without  issue using rollator  PTA. Patient resides with a daughter.  Will need further evaluation of patient gait and safety  to make further recommendations for DME and follow up therapy.  Notes indicate family interested in patient traveling to another state to be with family.      If plan is discharge home, recommend the following: Two people to help with walking and/or transfers;A little help with bathing/dressing/bathroom;Assistance with cooking/housework;Assist for transportation;Help with stairs or ramp for entrance   Can travel by private vehicle        Equipment Recommendations  Wheelchair (measurements PT);Wheelchair cushion (measurements PT)  Recommendations for Other Services       Functional Status Assessment Patient has had a recent decline in their functional status and demonstrates the ability to make significant improvements in function in a reasonable and predictable amount of time.     Precautions / Restrictions Precautions Precautions: Fall Precaution Comments: ataxic Restrictions Weight Bearing Restrictions: No      Mobility  Bed Mobility               General bed mobility comments: in recliner    Transfers Overall transfer level: Needs assistance Equipment used: Rolling walker (2 wheels) Transfers: Sit to/from Stand Sit to Stand: Max assist, +2 safety/equipment           General transfer comment: max support to power up to stand, max support to stand  and balance to  pull up briefs, noted trunk sway    Ambulation/Gait Ambulation/Gait assistance: +2 safety/equipment, +2 physical assistance, Mod assist, Max assist Gait Distance (Feet): 4 Feet (x 3) Assistive device: Rolling walker (2 wheels) Gait Pattern/deviations: Step-to pattern, Ataxic, Trunk flexed Gait velocity: decr     General Gait Details: Patient able to statrt taking small steps , then without warning the patient's trunk bgan to sway and arch and  required max support to balance. Recliner close by. Patient rested and performed 2 more short walks with similar rsponses, with ataxic like movements of the legs.  Stairs            Wheelchair Mobility     Tilt Bed    Modified Rankin (Stroke Patients Only)       Balance Overall balance  assessment: Needs assistance Sitting-balance support: Bilateral upper extremity supported, Feet supported Sitting balance-Leahy Scale: Fair     Standing balance support: Bilateral upper extremity supported, Reliant on assistive device for balance, During functional activity Standing balance-Leahy Scale: Poor                                Pertinent Vitals/Pain Pain Assessment Pain Assessment: No/denies pain    Home Living Family/patient expects to be discharged to:: Private residence Living Arrangements: Children Available Help at Discharge: Family;Available PRN/intermittently;Available 24 hours/day Type of Home: House Home Access: Stairs to enter Entrance Stairs-Rails: Left Entrance Stairs-Number of Steps: 3   Home Layout: One level Home Equipment: Agricultural consultant (2 wheels);BSC/3in1;Shower seat;Cane - single point;Grab bars - tub/shower;Rollator (4 wheels)      Prior Function Prior Level of Function : Independent/Modified Independent             Mobility Comments: PTA pt wakling withrollator recently started ADLs Comments: no assist with ADL     Extremity/Trunk Assessment   Upper Extremity Assessment Upper Extremity Assessment: Overall WFL for tasks assessed    Lower Extremity Assessment Lower Extremity Assessment: RLE deficits/detail;LLE deficits/detail RLE Deficits / Details: decreased hip flexion in sitting,  reports decreased sensation in ankle to toes, appreciates LT LLE Deficits / Details: WFL AROM, reports decreased senastion inankle to toes, appreciates light touch    Cervical / Trunk Assessment Cervical / Trunk Assessment: Kyphotic  Communication   Communication Communication: Hearing impairment  Cognition Arousal: Alert Behavior During Therapy: WFL for tasks assessed/performed Overall Cognitive Status: Within Functional Limits for tasks assessed                                          General Comments      Exercises     Assessment/Plan    PT Assessment Patient needs continued PT services  PT Problem List Decreased strength;Decreased balance;Decreased mobility;Decreased activity tolerance;Impaired sensation;Decreased safety awareness;Decreased knowledge of use of DME       PT Treatment Interventions DME instruction;Therapeutic  activities;Gait training;Therapeutic exercise;Patient/family education;Functional mobility training;Wheelchair mobility training;Neuromuscular re-education    PT Goals (Current goals can be found in the Care Plan section)  Acute Rehab PT Goals Patient Stated Goal: to walk PT Goal Formulation: With patient Time For Goal Achievement: 08/20/23 Potential to Achieve Goals: Fair    Frequency Min 1X/week     Co-evaluation               AM-PAC PT "6 Clicks" Mobility  Outcome Measure Help needed turning from your back to your side while in a flat bed without using bedrails?: A Lot Help needed moving from lying on your back to sitting on the side of a flat bed without using bedrails?: A Lot Help needed moving to and from a bed to a chair (including a wheelchair)?: A Lot Help needed standing up from a chair using your arms (e.g., wheelchair or bedside chair)?: A Lot Help needed to walk in hospital room?: Total Help needed climbing 3-5 steps with a railing? : Total 6 Click Score: 10    End of Session Equipment Utilized During Treatment: Gait belt Activity Tolerance: Treatment limited secondary to medical complications (Comment) Patient left: in chair;with call bell/phone within reach Nurse Communication: Mobility status (ataxia) PT Visit Diagnosis: Muscle weakness (generalized) (M62.81);Other abnormalities of  gait and mobility (R26.89);Difficulty in walking, not elsewhere classified (R26.2)    Time: 1025-1050 PT Time Calculation (min) (ACUTE ONLY): 25 min   Charges:   PT Evaluation $PT Eval Low Complexity: 1 Low PT Treatments $Gait Training: 8-22 mins PT General Charges $$ ACUTE PT VISIT: 1 Visit         Blanchard Kelch PT Acute Rehabilitation Services Office [phone] Weekend [phone]   Kristanna, Maden 08/06/2023, 1:55 PM

## 2023-08-06 NOTE — Progress Notes (Signed)
PROGRESS NOTE    LADY FETHERSTON  QMV:[id number] DOB: 04/13/1936 DOA: 08/04/2023 PCP: Mila Palmer, MD  Subjective: Pt seen and examined.  Right hip synovial fluid shows WBC 390 per ml.   Afebrile.  Changed to IV rocephin and IV daptomycin yesterday.  Discussed with pt's dtr lisa hill. She states that her brother-in-law is a Careers adviser in [place]. Pt's family is [place] would be willing to take patient into their home and provide care for patient for the next 6-8 weeks. Pt's grand-dtr that lives in [place] is also a PA.  Discussed with dtr lisa that she would be in charge of getting a homecare company name as our SW/CM do not know of any homecare companies in [place]. Homecare company would need to be able to provide IV ABX, HHRN and HHPT for patient.  We could order a wheelchair for patient.  Dtr would drive patient down to [place] on Monday, August 10, 2023.  Pt could have a temporary foley catheter for the drive to [place] and this could be removed by family when pt arrives to their home in [place].  Discussed with Dr. Drue Second who gives approval to place PICC line today.   Hospital Course: HPI:  GEANA LELLA is a 87 y.o. female with medical history significant of medical history as noted below.  Of particular importance is the fact that the patient had femoral fracture back in May that was repaired operatively.  Unfortunately patient postoperative course was complicated with a potential infection requiring a revision and patient has had poor healing of the skin since then.  Patient was last seen by orthopedic surgery yesterday when wound dehiscence site was sutured over.   History is obtained primarily from the daughter at the bedside but also from the patient herself to a good extent.  Patient was in her usual state of health till yesterday evening.  Patient is generally ambulatory with a walker, alert and awake and reasonably intelligently conversant with family  members and friends.  She ago she occasionally goes out with friends for lunch etc.   However since this morning patient has been noted to be basically extremely sleepy/lethargic and laying recumbent in bed.  Patient was since discovered to have a temperature of 101 F at home.  Further patient has been complaining of low back pain.  There is no complaint from the patient of ear pain sore throat cough trouble breathing chest pain neck pain. patient has a mild headache but not as severe headache.  There is no report of increased pain from the right hip.  No new rash, although patient is generally flushed.  There is no report of diarrhea or vomiting.     [facility] at around noon time and cared for by the ER attending Dr. Particia Nearing.  Patient was diagnosed with sepsis has received empiric antibiotics, workup as noted below.  Medical evaluation is sought.   Patient/daughter feel that the patient has generally improved since her initial presentation this morning.  She is more comfortable headache has improved and she is more interactive.  Significant Events: Admitted 08/04/2023 for sepsis with encephalopathy   Significant Labs: Admission WBC 10.6, lactic acid 2.0, BP 87/64(responded to IVF)  Significant Imaging Studies: Admission MRI lumbar spine shows Some enhancement in the discs at L4-L5 and L5-S1, with irregularity of the adjacent endplates at L5-S1 and small foci of increased T2 signal and enhancement at the L4 endplate, which could represent early discitis-osteomyelitis versus  the sequela of degenerative disc disease. Correlate with ESR and CRP. 2. Minimal enhancement in some of the left cauda equina nerve roots at L4 and L5, with some mild nerve root thickening, suggestive of arachnoiditis. 3. L3-L4 mild-to-moderate spinal canal stenosis. 4. L1-L2 and L2-L3 mild spinal canal stenosis. 5. L1-L2 mild spinal canal stenosis. 6. L5-S1 mild left neural foraminal  narrowing. 7. Narrowing of the lateral recesses at L1-L2, L2-L3, L3-L4, and L4-L5 could affect the descending L2, L3, L4, and L5 nerve roots, respectively.  Antibiotic Therapy: Anti-infectives (From admission, onward)    Start     Dose/Rate Route Frequency Ordered Stop   08/05/23 1745  vancomycin (VANCOREADY) IVPB 750 mg/150 mL        750 mg 150 mL/hr over 60 Minutes Intravenous Every 24 hours 08/04/23 2326     08/05/23 0000  piperacillin-tazobactam (ZOSYN) IVPB 3.375 g        3.375 g 12.5 mL/hr over 240 Minutes Intravenous Every 8 hours 08/04/23 2259     08/04/23 1515  metroNIDAZOLE (FLAGYL) IVPB 500 mg        500 mg 100 mL/hr over 60 Minutes Intravenous  Once 08/04/23 1505 08/04/23 1748   08/04/23 1515  vancomycin (VANCOCIN) IVPB 1000 mg/200 mL premix        1,000 mg 200 mL/hr over 60 Minutes Intravenous  Once 08/04/23 1508 08/04/23 1847   08/04/23 1515  ceFEPIme (MAXIPIME) 2 g in sodium chloride 0.9 % 100 mL IVPB        2 g 200 mL/hr over 30 Minutes Intravenous  Once 08/04/23 1508 08/04/23 1733       Procedures:   Consultants: orthopedics    Assessment and Plan: * Sepsis with encephalopathy (HCC) 08-04-2023 Although no fever is recorded in the medical record, patient daughter reports a temperature of 101 F at home.  Patient has leukocytosis and patient's blood pressure was soft at initial presentation.  Patient received 1.5 L of fluid and blood pressure seems to have improved mentation seems to have improved.  Patient has gotten lactic acid of 2.0 initially improved.  At this time patient still appears to be dry and I will continue 100 cc an hour of fluid overnight with reevaluation in the morning.  With regards to the focus of infection.  Respiratory panel, limited as negative.  Principal concern is the fact that the patient is complaining of low back pain and obviously has had an open sore in the right hip since May.  I do not appreciate any cross-sectional imaging in the  chart of the affected areas thus far.  I think it is time to do cross-sectional imaging of low back and right hip.  To best guide our further treatment.  In the interim I will continue patient on empiric vancomycin and zosyn for suspected osteomyelitis of the sites. Blood cultures pending.  Patient's mentation, per daughter has improved.  Patient did not demonstrate any marked confusion/hallucination or focal deficits to me.  No nuchal rigidity.  I think this is due to metabolic encephalopathy from sepsis we will continue with clinical monitoring in this regard.  08-05-2023 likely due to discitis. Was present on admission. WBC 10.6, lactic acid 2.0. MRI lumbar spine shows discitis. Now resolved.  Lumbar discitis 08-05-2023 MRI lumbar spine shows discitis. Ortho consulted and order right hip aspiration. Remains on IV zosyn/vanco. Will enlist assistance of ID to guide abx management and duration.  08-06-2023 Right hip synovial fluid shows WBC 390 per ml. Does not  appear to be septic joint. Septic joint criteria of >50K WBC per ml is the common criteria for septic arthritis.  Synovial fluid cx negative thus far.  Placed on cubicin and rocephin yesterday(08-05-2023) per ID consult. Expected duration of treatment will be 6 weeks. Changed to IV rocephin and IV daptomycin yesterday. Discussed with pt's dtr lisa hill. She states that her brother-in-law is a Careers adviser in [place]. Pt's family is [place] be willing to take patient into their home and provide care for patient for the next 6-8 weeks. Pt's grand-dtr that lives in [place] is also a PA.  Discussed with dtr lisa that she would be in charge of getting a homecare company name as our SW/CM do not know of any homecare companies in [place]. Homecare company would need to be able to provide IV ABX, HHRN and HHPT for patient. We could order a wheelchair for patient. Dtr would drive patient down to [place] on Monday, August 10, 2023. Pt could have a  temporary foley catheter for the drive to [place] and this could be removed by family when pt arrives to their home in [place]. Discussed with Dr. Drue Second who gives approval to place PICC line today.   Hyponatremia 08-05-2023 repeat BMP in AM.  08-06-2023 BMP pending today.  Chronic obstructive pulmonary disease (HCC) 08-05-2023 stable.  Hyperthyroidism 08-05-2023 continue methimazole  Essential hypertension 08-05-2023 BP meds on hold due to hypotension yesterday in ER(87/64)  08-06-2023 BP elevated this AM. Will restart ARB today.       DVT prophylaxis: enoxaparin (LOVENOX) injection 40 mg Start: 08/05/23 0800 SCDs Start: 08/04/23 2210    Code Status: Full Code Family Communication: discussed with pt and dtr lisa hill at bedside Disposition Plan: likely will go to live with family in [place]. Pt has son-in-law who is a Careers adviser in [place] and a grand-dtr that is a PA in [place]. Reason for continuing need for hospitalization: remains on IV ABX.  Objective: Vitals:   08/05/23 1605 08/05/23 1836 08/05/23 1947 08/06/23 0458  BP: 116/71  130/76 (!) 170/87  Pulse: 66  63 66  Resp: 16  16 17   Temp: 97.8 F (36.6 C)  97.9 F (36.6 C) 97.9 F (36.6 C)  TempSrc: Oral     SpO2: 96%  97% 97%  Weight:  56.5 kg    Height:  5\' 2"  (1.575 m)      Intake/Output Summary (Last 24 hours) at 08/06/2023 1305 Last data filed at 08/06/2023 0519 Gross per 24 hour  Intake 580 ml  Output 1250 ml  Net -670 ml   Filed Weights   08/04/23 1426 08/05/23 1836  Weight: 56.5 kg 56.5 kg    Examination:  Physical Exam Vitals and nursing note reviewed.  Constitutional:      General: She is not in acute distress.    Appearance: She is not toxic-appearing or diaphoretic.  HENT:     Head: Normocephalic and atraumatic.     Nose: Nose normal.  Eyes:     General: No scleral icterus. Cardiovascular:     Rate and Rhythm: Normal rate.  Pulmonary:     Effort: Pulmonary effort is normal.      Breath sounds: Normal breath sounds.  Abdominal:     General: Abdomen is flat.  Skin:    General: Skin is warm and dry.     Capillary Refill: Capillary refill takes less than 2 seconds.  Neurological:     Mental Status: She is alert and oriented to person,  place, and time.     Data Reviewed: I have personally reviewed following labs and imaging studies  CBC: Recent Labs  Lab 08/04/23 1448 08/05/23 0032  WBC 10.6* 7.1  NEUTROABS 9.9*  --   HGB 12.9 12.3  HCT 39.3 38.4  MCV 97.8 100.8*  PLT 243 188   Basic Metabolic Panel: Recent Labs  Lab 08/04/23 1448 08/04/23 2250 08/05/23 0032 08/06/23 0557  NA 138  --  132*  --   K 4.4  --  3.6  --   CL 102  --  101  --   CO2 26  --  23  --   GLUCOSE 134*  --  101*  --   BUN 19  --  17  --   CREATININE 0.65 0.51 0.52 0.46  CALCIUM 9.4  --  8.3*  --    GFR: Estimated Creatinine Clearance: 40.7 mL/min (by C-G formula based on SCr of 0.46 mg/dL). Liver Function Tests: Recent Labs  Lab 08/04/23 1448  AST 15  ALT 19  ALKPHOS 74  BILITOT 0.8  PROT 6.4*  ALBUMIN 3.5   Coagulation Profile: Recent Labs  Lab 08/04/23 1448  INR 1.1   Cardiac Enzymes: Recent Labs  Lab 08/05/23 1750  CKTOTAL 19*   Thyroid Function Tests: Recent Labs    08/05/23 0032  TSH 0.255*   Sepsis Labs: Recent Labs  Lab 08/04/23 1448 08/04/23 1732 08/04/23 2009 08/04/23 2215  LATICACIDVEN 1.8 2.0* 1.6 1.2    Recent Results (from the past 240 hour(s))  Culture, blood (Routine x 2)     Status: None (Preliminary result)   Collection Time: 08/04/23  2:29 PM   Specimen: BLOOD RIGHT ARM  Result Value Ref Range Status   Specimen Description   Final    BLOOD RIGHT ARM Performed at [facility], [address] [place], [place] [address]    Special Requests   Final    Blood Culture adequate volume BOTTLES DRAWN AEROBIC AND ANAEROBIC Performed at [facility], [address], [place], [place] [address]     Culture   Final    NO GROWTH 2 DAYS Performed at [facility], [address], [place], [place] [address]    Report Status PENDING  Incomplete  Culture, blood (Routine x 2)     Status: None (Preliminary result)   Collection Time: 08/04/23  2:34 PM   Specimen: BLOOD LEFT ARM  Result Value Ref Range Status   Specimen Description   Final    BLOOD LEFT ARM Performed at [facility], [address], [place], [place] [address]    Special Requests   Final    Blood Culture adequate volume BOTTLES DRAWN AEROBIC AND ANAEROBIC Performed at [facility], [address], [place], [place] [address]    Culture   Final    NO GROWTH 2 DAYS Performed at [facility], [address] [place], [place] [address]    Report Status PENDING  Incomplete  Resp panel by RT-PCR (RSV, Flu A&B, Covid) Anterior Nasal Swab     Status: None   Collection Time: 08/04/23  4:03 PM   Specimen: Anterior Nasal Swab  Result Value Ref Range Status   SARS Coronavirus 2 by RT PCR NEGATIVE NEGATIVE Final    Comment: (NOTE) SARS-CoV-2 target nucleic acids are NOT DETECTED.  The SARS-CoV-2 RNA is generally detectable in upper respiratory specimens during the acute phase of infection. The lowest concentration  of SARS-CoV-2 viral copies this assay can detect is 138 copies/mL. A negative result does not preclude SARS-Cov-2 infection and should not be used as the sole basis for treatment or other patient management decisions. A negative result may occur with  improper specimen collection/handling, submission of specimen other than nasopharyngeal swab, presence of viral mutation(s) within the areas targeted by this assay, and inadequate number of viral copies(<138 copies/mL). A negative result must be combined with clinical observations, patient history, and epidemiological information. The expected result is Negative.  Fact Sheet for Patients:   BloggerCourse.com  Fact Sheet for Healthcare Providers:  SeriousBroker.it  This test is no t yet approved or cleared by the [place] FDA and  has been authorized for detection and/or diagnosis of SARS-CoV-2 by FDA under an Emergency Use Authorization (EUA). This EUA will remain  in effect (meaning this test can be used) for the duration of the COVID-19 declaration under Section 564(b)(1) of the Act, 21 U.S.C.section 360bbb-3(b)(1), unless the authorization is terminated  or revoked sooner.       Influenza A by PCR NEGATIVE NEGATIVE Final   Influenza B by PCR NEGATIVE NEGATIVE Final    Comment: (NOTE) The Xpert Xpress SARS-CoV-2/FLU/RSV plus assay is intended as an aid in the diagnosis of influenza from Nasopharyngeal swab specimens and should not be used as a sole basis for treatment. Nasal washings and aspirates are unacceptable for Xpert Xpress SARS-CoV-2/FLU/RSV testing.  Fact Sheet for Patients: BloggerCourse.com  Fact Sheet for Healthcare Providers: SeriousBroker.it  This test is not yet approved or cleared by the [place] FDA and has been authorized for detection and/or diagnosis of SARS-CoV-2 by FDA under an Emergency Use Authorization (EUA). This EUA will remain in effect (meaning this test can be used) for the duration of the COVID-19 declaration under Section 564(b)(1) of the Act, 21 U.S.C. section 360bbb-3(b)(1), unless the authorization is terminated or revoked.     Resp Syncytial Virus by PCR NEGATIVE NEGATIVE Final    Comment: (NOTE) Fact Sheet for Patients: BloggerCourse.com  Fact Sheet for Healthcare Providers: SeriousBroker.it  This test is not yet approved or cleared by the [place] FDA and has been authorized for detection and/or diagnosis of SARS-CoV-2 by FDA under an Emergency Use  Authorization (EUA). This EUA will remain in effect (meaning this test can be used) for the duration of the COVID-19 declaration under Section 564(b)(1) of the Act, 21 U.S.C. section 360bbb-3(b)(1), unless the authorization is terminated or revoked.  Performed at [facility], [address] [place], [place] [address]   Body fluid culture w Gram Stain     Status: None (Preliminary result)   Collection Time: 08/05/23  2:18 PM   Specimen: Synovium; Body Fluid  Result Value Ref Range Status   Specimen Description SYNOVIAL  Final   Special Requests R HIP  Final   Gram Stain NO WBC SEEN NO ORGANISMS SEEN   Final   Culture   Final    NO GROWTH < 24 HOURS Performed at [facility], [address] [place], [place] [address]    Report Status PENDING  Incomplete     Radiology Studies: [place] EKG SITE RITE  Result Date: 08/06/2023 If Site Rite image not attached, placement could not be confirmed due to current cardiac rhythm.  MR HIP RIGHT W WO CONTRAST  Result Date: 08/05/2023 CLINICAL DATA:  Sepsis and osteomyelitis. EXAM: MRI OF THE RIGHT HIP WITHOUT AND WITH CONTRAST TECHNIQUE: Multiplanar, multisequence MR imaging was  performed both before and after administration of intravenous contrast. CONTRAST:  5mL GADAVIST GADOBUTROL 1 MMOL/ML IV SOLN COMPARISON:  Radiographs 08/04/2023 FINDINGS: BONES AND JOINTS: Prominent field heterogeneity related to the right hip implant. Unfortunately this reduces diagnostic sensitivity and specificity, with poor and heterogeneous fat saturation all around the implant. This substantially lower sensitivity in detecting marrow edema in the adjacent bony structures No obvious dropout of T1 signal in the bony pelvis near the implant to imply active osteomyelitis. I do not see a substantial bony erosion or obvious periprosthetic fluid collection along the visualized part of the prosthesis. Moderate degenerative spurring and loss of articular  space in the contralateral (left) hip joint. MUSCULOTENDINOUS: Sensitivity for muscle edema is adversely affected by field heterogeneity due to the hip implant. STIR images were not obtained. Regional bilateral muscular atrophy mildly greater on the right than the left. OTHER: Distended urinary bladder. No significant regional adenopathy is observed. IMPRESSION: 1. Prominent field heterogeneity related to the right hip implant, reducing diagnostic sensitivity and specificity. No obvious dropout of T1 signal in the bony pelvis near the implant to imply active osteomyelitis. I do not see a substantial bony erosion or obvious periprosthetic fluid collection along the visualized part of the prosthesis. 2. Moderate degenerative spurring and loss of articular space in the contralateral (left) hip joint. 3. Regional bilateral muscular atrophy mildly greater on the right than the left. 4. Distended urinary bladder. Electronically Signed   By: Gaylyn Rong M.D.   On: 08/05/2023 16:32   IR US DRAIN/INJ MAJOR JOINT/BURSA  Result Date: 08/05/2023 INDICATION: 87 year old female with possible right hip joint infection. She presents to IR for aspiration under imaging guidance. EXAM: IR DRAIN/INJ MAJOR JOINT/BURSA MEDICATIONS: The patient is currently admitted to the hospital and receiving intravenous antibiotics. The antibiotics were administered within an appropriate time frame prior to the initiation of the procedure. ANESTHESIA/SEDATION: None. COMPLICATIONS: None immediate. PROCEDURE: Informed written consent was obtained from the patient after a thorough discussion of the procedural risks, benefits and alternatives. All questions were addressed. Maximal Sterile Barrier Technique was utilized including caps, mask, sterile gowns, sterile gloves, sterile drape, hand hygiene and skin antiseptic. A timeout was performed prior to the initiation of the procedure. Right hip was interrogated with ultrasound. No large fluid  collection visualized. Therefore, fluoroscopic imaging was used to localize the neck of the hip arthroplasty prosthesis. The skin was marked and then sterilely prepped and draped in the standard fashion using chlorhexidine skin prep. Local anesthesia was attained by infiltration with 1% lidocaine. Under fluoroscopic guidance, a 20 gauge 3-1/2 inch needle was advanced into the joint space. Aspiration yields approximately 5 mL of golden synovial fluid. This was sent to the lab for further evaluation. The needle was removed and a Band-Aid applied. IMPRESSION: Successful aspiration of 5 mL golden synovial fluid which was sent for Gram stain, culture, cell count with differential and crystal analysis. Electronically Signed   By: Malachy Moan M.D.   On: 08/05/2023 15:59   MR Lumbar Spine W Wo Contrast  Result Date: 08/05/2023 CLINICAL DATA:  Patient septic, concern for osteomyelitis; weakness EXAM: MRI LUMBAR SPINE WITHOUT AND WITH CONTRAST TECHNIQUE: Multiplanar and multiecho pulse sequences of the lumbar spine were obtained without and with intravenous contrast. CONTRAST:  5mL GADAVIST GADOBUTROL 1 MMOL/ML IV SOLN COMPARISON:  None Available. FINDINGS: Segmentation:  5 lumbar type vertebral bodies. Alignment: Levocurvature. 2 mm anterolisthesis of L2 on L3. 5 mm anterolisthesis of L3 on L4. 4 mm retrolisthesis of  L4 on L5. Vertebrae: No evidence of acute fracture or suspicious osseous lesion. Some enhancement is noted in the disc spaces of L4-L5 and L5-S1 (series 11, image 5). The adjacent endplates at L5-S1 appear irregular, with increased T2 signal and minimal enhancement (series 11, images 6-9). Degenerative changes are noted at the L4 endplate, with small foci of increased T2 signal and enhancement (series 11, image 9 and series 7, image 9 Endplate degenerative changes, with increased T2 signal and some contrast enhancement at T11-T12, eccentric the left, L1-L2, eccentric to the right, and L3-L4, eccentric  to the left, compatible with edema and inflammation, most likely Modic type 1 endplate changes, without abnormal signal disc spaces. Additional Modic type 3 endplate changes, with low T1 and T2 signal, are noted at L2-L3, eccentric to the right. Conus medullaris and cauda equina: Conus extends to the L1 level. Minimal enhancement in some of the left cauda equina nerve roots at L4 (series 12, image 23 and L5 (series 12, image 30) with some mild nerve root thickening (series 9, image 23). No epidural collection. Paraspinal and other soft tissues: Atrophy of the posterior inferior paraspinous muscles, right psoas, and right iliacus. No lymphadenopathy. Disc levels: T11-T12: Seen only on the sagittal images. Mild disc bulge with left subarticular protrusion. No spinal canal stenosis or neural foraminal narrowing. T12-L1: Mild disc bulge with right foraminal and extreme lateral protrusion. Mild facet arthropathy. Mild facet arthropathy. No spinal canal stenosis or neural foraminal narrowing. L1-L2: Right eccentric disc bulge. Mild facet arthropathy. Narrowing of the right lateral recess. Mild spinal canal stenosis. No neural foraminal narrowing. L2-L3: Mild disc bulge. Mild facet arthropathy. Narrowing of the right-greater-than-left lateral recess. Mild spinal canal stenosis. No neural foraminal narrowing. L3-L4: Anterolisthesis with disc unroofing and moderate disc bulge. Moderate facet arthropathy. Ligamentum flavum hypertrophy. Narrowing of the left-greater-than-right lateral recess. Mild to moderate spinal canal stenosis. No neural foraminal narrowing. L4-L5: Retrolisthesis and mild disc bulge. Mild facet arthropathy. Narrowing of the lateral recesses. No spinal canal stenosis or neural foraminal narrowing. L5-S1: Mild disc bulge, which may contact the exiting L5 nerve roots, with superimposed central protrusion. Mild-to-moderate facet arthropathy. No spinal canal stenosis. Mild left neural foraminal narrowing.  IMPRESSION: 1. Some enhancement in the discs at L4-L5 and L5-S1, with irregularity of the adjacent endplates at L5-S1 and small foci of increased T2 signal and enhancement at the L4 endplate, which could represent early discitis-osteomyelitis versus the sequela of degenerative disc disease. Correlate with ESR and CRP. 2. Minimal enhancement in some of the left cauda equina nerve roots at L4 and L5, with some mild nerve root thickening, suggestive of arachnoiditis. 3. L3-L4 mild-to-moderate spinal canal stenosis. 4. L1-L2 and L2-L3 mild spinal canal stenosis. 5. L1-L2 mild spinal canal stenosis. 6. L5-S1 mild left neural foraminal narrowing. 7. Narrowing of the lateral recesses at L1-L2, L2-L3, L3-L4, and L4-L5 could affect the descending L2, L3, L4, and L5 nerve roots, respectively. Electronically Signed   By: Wiliam Ke M.D.   On: 08/05/2023 01:29   DG Hip Unilat W or Wo Pelvis 2-3 Views Right  Result Date: 08/04/2023 CLINICAL DATA:  Right hip pain EXAM: DG HIP (WITH OR WITHOUT PELVIS) 2-3V RIGHT COMPARISON:  02/09/2023 CT scan FINDINGS: Right hip prosthesis noted with single cerclage wire along the proximal femur. No visible periprosthetic fracture. No acute bony findings. Moderate degenerative left hip arthropathy. Lower lumbar spondylosis and degenerative disc disease. Vascular calcifications. Bony demineralization. IMPRESSION: 1. Right hip prosthesis without visible periprosthetic fracture or  acute complicating feature. 2. Moderate degenerative left hip arthropathy. 3. Lower lumbar spondylosis and degenerative disc disease. 4. Bony demineralization. Electronically Signed   By: Gaylyn Rong M.D.   On: 08/04/2023 20:15   DG Chest Port 1 View  Result Date: 08/04/2023 CLINICAL DATA:  Shortness of breath weakness, fatigue.  Fever. EXAM: PORTABLE CHEST 1 VIEW COMPARISON:  02/07/2023 FINDINGS: Atherosclerotic calcification of the aortic arch. Dextroconvex thoracic scoliosis. The lungs appear clear.  No blunting of the costophrenic angles. Heart size within normal limits. Degenerative spurring of both humeral heads. Clips project over the right lower neck. IMPRESSION: 1. No acute findings. 2. Dextroconvex thoracic scoliosis. 3. Aortic Atherosclerosis (ICD10-I70.0). Electronically Signed   By: Gaylyn Rong M.D.   On: 08/04/2023 20:11    Scheduled Meds:  acetaminophen  650 mg Oral Q4H while awake   aspirin EC  81 mg Oral Daily   enoxaparin (LOVENOX) injection  40 mg Subcutaneous Q24H   ezetimibe  10 mg Oral Daily   And   simvastatin  20 mg Oral Daily   irbesartan  75 mg Oral Daily   methIMAzole  10 mg Oral Daily   mirtazapine  30 mg Oral QHS   mometasone-formoterol  2 puff Inhalation BID   montelukast  10 mg Oral QHS   sodium chloride flush  3 mL Intravenous Q12H   Continuous Infusions:  cefTRIAXone (ROCEPHIN)  IV 2 g (08/05/23 2108)   DAPTOmycin 500 mg (08/05/23 1903)     LOS: 2 days   Time spent: 45 minutes  Carollee Herter, DO  [facility]  08/06/2023, 1:05 PM

## 2023-08-06 NOTE — Progress Notes (Addendum)
[facility]    Date of Admission:  08/04/2023   Total days of antibiotics 3   ID: Ariel Lambert is a 87 y.o. female with   Principal Problem:   Sepsis with encephalopathy (HCC) Active Problems:   Essential hypertension   Hyperthyroidism   Chronic obstructive pulmonary disease (HCC)   Lumbar discitis   Hyponatremia    Subjective: Afebrile, feeling better, denies back pain.  Medications:   acetaminophen  650 mg Oral Q4H while awake   aspirin EC  81 mg Oral Daily   enoxaparin (LOVENOX) injection  40 mg Subcutaneous Q24H   ezetimibe  10 mg Oral Daily   And   simvastatin  20 mg Oral Daily   irbesartan  75 mg Oral Daily   methIMAzole  10 mg Oral Daily   mirtazapine  30 mg Oral QHS   mometasone-formoterol  2 puff Inhalation BID   montelukast  10 mg Oral QHS   sodium chloride flush  3 mL Intravenous Q12H    Objective: Vital signs in last 24 hours: Temp:  [97.9 F (36.6 C)-98.4 F (36.9 C)] 98.4 F (36.9 C) (11/21 1342) Pulse Rate:  [63-79] 79 (11/21 1342) Resp:  [15-17] 15 (11/21 1342) BP: (110-170)/(76-89) 110/89 (11/21 1342) SpO2:  [97 %] 97 % (11/21 1342) Weight:  [56.5 kg] 56.5 kg (11/20 1836)  Physical Exam  Constitutional:  oriented to person, place, and time. appears well-developed and well-nourished. No distress.  HENT: [place]/AT, PERRLA, no scleral icterus Mouth/Throat: Oropharynx is clear and moist. No oropharyngeal exudate.  Cardiovascular: Normal rate, regular rhythm and normal heart sounds. Exam reveals no gallop and no friction rub.  No murmur heard.  Pulmonary/Chest: Effort normal and breath sounds normal. No respiratory distress.  has no wheezes.  Neck = supple, no nuchal rigidity Abdominal: Soft. Bowel sounds are normal.  exhibits no distension. There is no tenderness.  Lymphadenopathy: no cervical adenopathy. No axillary adenopathy Neurological: alert and oriented to person, place, and time.  Skin: Skin is warm  and dry. No rash noted. No erythema.  Psychiatric: a normal mood and affect.  behavior is normal.    Lab Results Recent Labs    08/04/23 1448 08/04/23 2250 08/05/23 0032 08/06/23 0557  WBC 10.6*  --  7.1  --   HGB 12.9  --  12.3  --   HCT 39.3  --  38.4  --   NA 138  --  132* 139  K 4.4  --  3.6 3.4*  CL 102  --  101 109  CO2 26  --  23 24  BUN 19  --  17 11  CREATININE 0.65   < > 0.52 0.46  0.46   < > = values in this interval not displayed.   Liver Panel Recent Labs    08/04/23 1448  PROT 6.4*  ALBUMIN 3.5  AST 15  ALT 19  ALKPHOS 74  BILITOT 0.8   Sedimentation Rate Recent Labs    08/05/23 0825  ESRSEDRATE 17   C-Reactive Protein Recent Labs    08/05/23 0825  CRP 10.2*    Microbiology: Blood cx ngtd Studies/Results: [place] EKG SITE RITE  Result Date: 08/06/2023 If Site Rite image not attached, placement could not be confirmed due to current cardiac rhythm.  MR HIP RIGHT W WO CONTRAST  Result Date: 08/05/2023 CLINICAL DATA:  Sepsis and osteomyelitis. EXAM: MRI OF THE RIGHT HIP WITHOUT AND WITH CONTRAST TECHNIQUE: Multiplanar, multisequence MR imaging  was performed both before and after administration of intravenous contrast. CONTRAST:  5mL GADAVIST GADOBUTROL 1 MMOL/ML IV SOLN COMPARISON:  Radiographs 08/04/2023 FINDINGS: BONES AND JOINTS: Prominent field heterogeneity related to the right hip implant. Unfortunately this reduces diagnostic sensitivity and specificity, with poor and heterogeneous fat saturation all around the implant. This substantially lower sensitivity in detecting marrow edema in the adjacent bony structures No obvious dropout of T1 signal in the bony pelvis near the implant to imply active osteomyelitis. I do not see a substantial bony erosion or obvious periprosthetic fluid collection along the visualized part of the prosthesis. Moderate degenerative spurring and loss of articular space in the contralateral (left) hip joint.  MUSCULOTENDINOUS: Sensitivity for muscle edema is adversely affected by field heterogeneity due to the hip implant. STIR images were not obtained. Regional bilateral muscular atrophy mildly greater on the right than the left. OTHER: Distended urinary bladder. No significant regional adenopathy is observed. IMPRESSION: 1. Prominent field heterogeneity related to the right hip implant, reducing diagnostic sensitivity and specificity. No obvious dropout of T1 signal in the bony pelvis near the implant to imply active osteomyelitis. I do not see a substantial bony erosion or obvious periprosthetic fluid collection along the visualized part of the prosthesis. 2. Moderate degenerative spurring and loss of articular space in the contralateral (left) hip joint. 3. Regional bilateral muscular atrophy mildly greater on the right than the left. 4. Distended urinary bladder. Electronically Signed   By: Gaylyn Rong M.D.   On: 08/05/2023 16:32   IR US DRAIN/INJ MAJOR JOINT/BURSA  Result Date: 08/05/2023 INDICATION: 87 year old female with possible right hip joint infection. She presents to IR for aspiration under imaging guidance. EXAM: IR DRAIN/INJ MAJOR JOINT/BURSA MEDICATIONS: The patient is currently admitted to the hospital and receiving intravenous antibiotics. The antibiotics were administered within an appropriate time frame prior to the initiation of the procedure. ANESTHESIA/SEDATION: None. COMPLICATIONS: None immediate. PROCEDURE: Informed written consent was obtained from the patient after a thorough discussion of the procedural risks, benefits and alternatives. All questions were addressed. Maximal Sterile Barrier Technique was utilized including caps, mask, sterile gowns, sterile gloves, sterile drape, hand hygiene and skin antiseptic. A timeout was performed prior to the initiation of the procedure. Right hip was interrogated with ultrasound. No large fluid collection visualized. Therefore, fluoroscopic  imaging was used to localize the neck of the hip arthroplasty prosthesis. The skin was marked and then sterilely prepped and draped in the standard fashion using chlorhexidine skin prep. Local anesthesia was attained by infiltration with 1% lidocaine. Under fluoroscopic guidance, a 20 gauge 3-1/2 inch needle was advanced into the joint space. Aspiration yields approximately 5 mL of golden synovial fluid. This was sent to the lab for further evaluation. The needle was removed and a Band-Aid applied. IMPRESSION: Successful aspiration of 5 mL golden synovial fluid which was sent for Gram stain, culture, cell count with differential and crystal analysis. Electronically Signed   By: Malachy Moan M.D.   On: 08/05/2023 15:59   MR Lumbar Spine W Wo Contrast  Result Date: 08/05/2023 CLINICAL DATA:  Patient septic, concern for osteomyelitis; weakness EXAM: MRI LUMBAR SPINE WITHOUT AND WITH CONTRAST TECHNIQUE: Multiplanar and multiecho pulse sequences of the lumbar spine were obtained without and with intravenous contrast. CONTRAST:  5mL GADAVIST GADOBUTROL 1 MMOL/ML IV SOLN COMPARISON:  None Available. FINDINGS: Segmentation:  5 lumbar type vertebral bodies. Alignment: Levocurvature. 2 mm anterolisthesis of L2 on L3. 5 mm anterolisthesis of L3 on L4. 4 mm retrolisthesis  of L4 on L5. Vertebrae: No evidence of acute fracture or suspicious osseous lesion. Some enhancement is noted in the disc spaces of L4-L5 and L5-S1 (series 11, image 5). The adjacent endplates at L5-S1 appear irregular, with increased T2 signal and minimal enhancement (series 11, images 6-9). Degenerative changes are noted at the L4 endplate, with small foci of increased T2 signal and enhancement (series 11, image 9 and series 7, image 9 Endplate degenerative changes, with increased T2 signal and some contrast enhancement at T11-T12, eccentric the left, L1-L2, eccentric to the right, and L3-L4, eccentric to the left, compatible with edema and  inflammation, most likely Modic type 1 endplate changes, without abnormal signal disc spaces. Additional Modic type 3 endplate changes, with low T1 and T2 signal, are noted at L2-L3, eccentric to the right. Conus medullaris and cauda equina: Conus extends to the L1 level. Minimal enhancement in some of the left cauda equina nerve roots at L4 (series 12, image 23 and L5 (series 12, image 30) with some mild nerve root thickening (series 9, image 23). No epidural collection. Paraspinal and other soft tissues: Atrophy of the posterior inferior paraspinous muscles, right psoas, and right iliacus. No lymphadenopathy. Disc levels: T11-T12: Seen only on the sagittal images. Mild disc bulge with left subarticular protrusion. No spinal canal stenosis or neural foraminal narrowing. T12-L1: Mild disc bulge with right foraminal and extreme lateral protrusion. Mild facet arthropathy. Mild facet arthropathy. No spinal canal stenosis or neural foraminal narrowing. L1-L2: Right eccentric disc bulge. Mild facet arthropathy. Narrowing of the right lateral recess. Mild spinal canal stenosis. No neural foraminal narrowing. L2-L3: Mild disc bulge. Mild facet arthropathy. Narrowing of the right-greater-than-left lateral recess. Mild spinal canal stenosis. No neural foraminal narrowing. L3-L4: Anterolisthesis with disc unroofing and moderate disc bulge. Moderate facet arthropathy. Ligamentum flavum hypertrophy. Narrowing of the left-greater-than-right lateral recess. Mild to moderate spinal canal stenosis. No neural foraminal narrowing. L4-L5: Retrolisthesis and mild disc bulge. Mild facet arthropathy. Narrowing of the lateral recesses. No spinal canal stenosis or neural foraminal narrowing. L5-S1: Mild disc bulge, which may contact the exiting L5 nerve roots, with superimposed central protrusion. Mild-to-moderate facet arthropathy. No spinal canal stenosis. Mild left neural foraminal narrowing. IMPRESSION: 1. Some enhancement in the discs  at L4-L5 and L5-S1, with irregularity of the adjacent endplates at L5-S1 and small foci of increased T2 signal and enhancement at the L4 endplate, which could represent early discitis-osteomyelitis versus the sequela of degenerative disc disease. Correlate with ESR and CRP. 2. Minimal enhancement in some of the left cauda equina nerve roots at L4 and L5, with some mild nerve root thickening, suggestive of arachnoiditis. 3. L3-L4 mild-to-moderate spinal canal stenosis. 4. L1-L2 and L2-L3 mild spinal canal stenosis. 5. L1-L2 mild spinal canal stenosis. 6. L5-S1 mild left neural foraminal narrowing. 7. Narrowing of the lateral recesses at L1-L2, L2-L3, L3-L4, and L4-L5 could affect the descending L2, L3, L4, and L5 nerve roots, respectively. Electronically Signed   By: Wiliam Ke M.D.   On: 08/05/2023 01:29     Assessment/Plan: 87yo F with lumbar-sacral osteomyelitis - will plan to treat for 6 wk with daptomcyin and ceftriaxone 2gm iv daily - plan to get picc line - will arrange for follow up in 6-7 wk at conclusion of iv abtx  Diagnosis: discitis  Culture Result: culture negative  Allergies  Allergen Reactions   Cetirizine Other (See Comments)    Stomach pain   Codeine Itching    Severe itching   Hydrocodone Itching  Meperidine Hcl Diarrhea and Nausea And Vomiting    (Demerol)   Nabumetone Rash    (Relafen)   Naproxen Itching and Rash   Sulfa Antibiotics Rash   Sulfonamide Derivatives Rash    OPAT Orders Discharge antibiotics to be given via PICC line Discharge antibiotics: Per pharmacy protocol daptomycin 8mg /kg/day plus ceftriaxone 2gm IV daily Aim for Vancomycin trough 15-20 or AUC 400-550 (unless otherwise indicated) Duration: 6 wk End Date: Jan 6th 2025  [facility] Care Per Protocol:  Home health RN for IV administration and teaching; PICC line care and labs.    Labs weekly while on IV antibiotics: __x CBC with differential _x_ BMP  _x_ CRP _x_ ESR  x__ CK  _x_  Please pull PIC at completion of IV antibiotics   Fax weekly labs to [phone]  Clinic Follow Up Appt: 6 wk  @ [facility]   [facility] for Infectious Diseases Pager: [phone]  08/06/2023, 4:28 PM

## 2023-08-06 NOTE — Progress Notes (Addendum)
PHARMACY CONSULT NOTE FOR:  OUTPATIENT  PARENTERAL ANTIBIOTIC THERAPY (OPAT)  Indication: Discitis/osteo Regimen: Daptomycin 500 mg IV every 24 hours and Rocephin 2g IV every 24 hours End date: 09/17/23  IV antibiotic discharge orders are pended. To discharging provider:  please sign these orders via discharge navigator,  Select New Orders & click on the button choice - Manage This Unsigned Work.     Thank you for allowing pharmacy to be a part of this patient's care.  Georgina Pillion, PharmD, BCPS, BCIDP Infectious Diseases Clinical Pharmacist 08/06/2023 4:10 PM   **Pharmacist phone directory can now be found on [email or web address] ([email or web address]).  Listed under [facility].

## 2023-08-06 NOTE — TOC Initial Note (Addendum)
Transition of Care [facility]) - Initial/Assessment Note    Patient Details  Name: Ariel Lambert MRN: [id number] Date of Birth: 26-Jul-1936  Transition of Care [facility]) CM/SW Contact:    Otelia Santee, LCSW Phone Number: 08/06/2023, 2:07 PM  Clinical Narrative:                 Pt from home alone. Pt is planned to discharge with IV abx. Pt and family would like for pt to stay with family in [place], [place] at discharge. Pt's daughter, [name] who lives in [place] is to reach out to CSW to provide home care agency information.  CSW will assist in arranging home IV abx and home health services for pt once information received from pt's daughter.   ADDENDUM: Pam w/ [facility] will be following for IV abx. They will work on preparing everything for pt at their [facility] [facility] and transition her care to [place] office when pt discharges to [place].   Expected Discharge Plan: Home w Home Health Services Barriers to Discharge: Continued Medical Work up   Patient Goals and CMS Choice Patient states their goals for this hospitalization and ongoing recovery are:: For pt to stay with family at discharge and receive [facility] CMS Medicare.gov Compare Post Acute Care list provided to:: Patient Choice offered to / list presented to : Patient [place] ownership interest in [facility].provided to::  (NA)    Expected Discharge Plan and Services In-house Referral: Clinical Social Work Discharge Planning Services: NA Post Acute Care Choice: Home Health, Durable Medical Equipment Living arrangements for the past 2 months: Single Family Home                                      Prior Living Arrangements/Services Living arrangements for the past 2 months: Single Family Home Lives with:: Self Patient language and need for interpreter reviewed:: Yes Do you feel safe going back to the place where you live?: Yes      Need for Family Participation in Patient Care: Yes (Comment) Care giver support system  in place?: No (comment)   Criminal Activity/Legal Involvement Pertinent to Current Situation/Hospitalization: No - Comment as needed  Activities of Daily Living   ADL Screening (condition at time of admission) Independently performs ADLs?: Yes (appropriate for developmental age) Is the patient deaf or have difficulty hearing?: Yes Does the patient have difficulty seeing, even when wearing glasses/contacts?: No Does the patient have difficulty concentrating, remembering, or making decisions?: No  Permission Sought/Granted Permission sought to share information with : Facility Medical sales representative, Family Supports Permission granted to share information with : Yes, Verbal Permission Granted  Share Information with NAME: Daine Gip, and [name]  Permission granted to share info w AGENCY: [facility]  Permission granted to share info w Relationship: Daughters     Emotional Assessment Appearance:: Appears stated age Attitude/Demeanor/Rapport: Engaged Affect (typically observed): Accepting, Pleasant Orientation: : Oriented to Self, Oriented to Place, Oriented to  Time, Oriented to Situation Alcohol / Substance Use: Not Applicable Psych Involvement: No (comment)  Admission diagnosis:  Sepsis with encephalopathy (HCC) [A41.9, R65.20, G93.41] Sepsis with encephalopathy and septic shock, due to unspecified organism (HCC) [A41.9, R65.21, G93.41] Patient Active Problem List   Diagnosis Date Noted   Lumbar discitis 08/05/2023   Hyponatremia 08/05/2023   Sepsis with encephalopathy (HCC) 08/04/2023   Constipation 02/07/2023   S/P right hip fracture 02/05/2023   Hyperthyroidism  02/05/2023   Hypertension 02/05/2023   Chronic obstructive pulmonary disease (HCC) 02/05/2023   Primary osteoarthritis of left knee 09/30/2021   Anemia 10/11/2020   Carotid artery stenosis 10/11/2020   Cerebral infarction due to unspecified occlusion or stenosis of left middle cerebral artery (HCC) 10/11/2020   Cervical  spondylosis without myelopathy 10/11/2020   Chronic kidney disease, stage 1 10/11/2020   Decreased estrogen level 10/11/2020   Hardening of the aorta (main artery of the heart) (HCC) 10/11/2020   Hematuria 10/11/2020   History of carotid endarterectomy 10/11/2020   Impairment of balance 10/11/2020   Insomnia 10/11/2020   Major depression in complete remission (HCC) 10/11/2020   Chronic nephritic syndrome, focal and segmental glomerular lesions 10/11/2020   Non-toxic goiter 10/11/2020   Occlusion and stenosis of bilateral carotid arteries 10/11/2020   Overactive bladder 10/11/2020   Protein calorie malnutrition (HCC) 10/11/2020   Psoriasis 10/11/2020   Vitamin D deficiency 10/11/2020   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/29/2019   Bilateral knee pain 07/01/2019   Neck nodule 06/22/2019   COPD exacerbation (HCC) 09/02/2015   Asthma exacerbation 09/02/2015   Allergic rhinitis 03/16/2013   Nontoxic multinodular goiter 12/30/2010   ASTHMA 05/03/2009   HYPOKALEMIA 06/13/2008   Essential hypertension 04/30/2007   OSTEOPOROSIS 04/30/2007   PCP:  Mila Palmer, MD Pharmacy:   [id number] - [place], [place] - [address] AT [address] [place] [address] Phone: [phone] Fax: [phone]     Social Determinants of Health (SDOH) Social History: SDOH Screenings   Food Insecurity: No Food Insecurity (08/04/2023)  Housing: Patient Declined (08/04/2023)  Transportation Needs: No Transportation Needs (08/04/2023)  Utilities: Not At Risk (08/04/2023)  Tobacco Use: Medium Risk (08/04/2023)   SDOH Interventions:     Readmission Risk Interventions    08/06/2023    2:04 PM  Readmission Risk Prevention Plan  Transportation Screening Complete  PCP or Specialist Appt within 5-7 Days Complete  Home Care Screening Complete  Medication Review (RN CM) Complete

## 2023-08-06 NOTE — Plan of Care (Signed)
  Problem: Education: Goal: Knowledge of General Education information will improve Description Including pain rating scale, medication(s)/side effects and non-pharmacologic comfort measures Outcome: Progressing   Problem: Health Behavior/Discharge Planning: Goal: Ability to manage health-related needs will improve Outcome: Progressing   

## 2023-08-06 NOTE — Progress Notes (Signed)
Peripherally Inserted Central Catheter Placement  The IV Nurse has discussed with the patient and/or persons authorized to consent for the patient, the purpose of this procedure and the potential benefits and risks involved with this procedure.  The benefits include less needle sticks, lab draws from the catheter, and the patient may be discharged home with the catheter. Risks include, but not limited to, infection, bleeding, blood clot (thrombus formation), and puncture of an artery; nerve damage and irregular heartbeat and possibility to perform a PICC exchange if needed/ordered by physician.  Alternatives to this procedure were also discussed.  Bard Power PICC patient education guide, fact sheet on infection prevention and patient information card has been provided to patient /or left at bedside.    PICC Placement Documentation  PICC Single Lumen 08/06/23 Right Basilic 38 cm 0 cm (Active)  Indication for Insertion or Continuance of Line Prolonged intravenous therapies 08/06/23 2134  Exposed Catheter (cm) 0 cm 08/06/23 2134  Line Status Flushed;Saline locked;Blood return noted 08/06/23 2134  Dressing Type Transparent;Securing device 08/06/23 2134  Dressing Status Antimicrobial disc in place 08/06/23 2134  Line Care Connections checked and tightened 08/06/23 2134  Line Adjustment (NICU/IV Team Only) No 08/06/23 2134  Dressing Intervention New dressing;Adhesive placed at insertion site (IV team only) 08/06/23 2134  Dressing Change Due 08/13/23 08/06/23 2134       Elenore Paddy 08/06/2023, 9:35 PM

## 2023-08-06 NOTE — Plan of Care (Signed)
No acute events this shift. The patient has rested comfortably throughout the shift. Her pain has been well managed with scheduled tylenol. VSS. Fall precautions in place. Will continue to monitor.  Problem: Education: Goal: Knowledge of General Education information will improve Description: Including pain rating scale, medication(s)/side effects and non-pharmacologic comfort measures Outcome: Progressing   Problem: Health Behavior/Discharge Planning: Goal: Ability to manage health-related needs will improve Outcome: Progressing   Problem: Clinical Measurements: Goal: Ability to maintain clinical measurements within normal limits will improve Outcome: Progressing Goal: Will remain free from infection Outcome: Progressing Goal: Diagnostic test results will improve Outcome: Progressing Goal: Respiratory complications will improve Outcome: Progressing Goal: Cardiovascular complication will be avoided Outcome: Progressing   Problem: Activity: Goal: Risk for activity intolerance will decrease Outcome: Progressing   Problem: Nutrition: Goal: Adequate nutrition will be maintained Outcome: Progressing   Problem: Coping: Goal: Level of anxiety will decrease Outcome: Progressing   Problem: Elimination: Goal: Will not experience complications related to bowel motility Outcome: Progressing Goal: Will not experience complications related to urinary retention Outcome: Progressing   Problem: Pain Management: Goal: General experience of comfort will improve Outcome: Progressing   Problem: Safety: Goal: Ability to remain free from injury will improve Outcome: Progressing   Problem: Skin Integrity: Goal: Risk for impaired skin integrity will decrease Outcome: Progressing

## 2023-08-07 DIAGNOSIS — M4627 Osteomyelitis of vertebra, lumbosacral region: Secondary | ICD-10-CM | POA: Diagnosis not present

## 2023-08-07 DIAGNOSIS — A419 Sepsis, unspecified organism: Secondary | ICD-10-CM | POA: Diagnosis not present

## 2023-08-07 DIAGNOSIS — M4646 Discitis, unspecified, lumbar region: Secondary | ICD-10-CM | POA: Diagnosis not present

## 2023-08-07 DIAGNOSIS — I10 Essential (primary) hypertension: Secondary | ICD-10-CM | POA: Diagnosis not present

## 2023-08-07 DIAGNOSIS — J449 Chronic obstructive pulmonary disease, unspecified: Secondary | ICD-10-CM | POA: Diagnosis not present

## 2023-08-07 MED ORDER — SODIUM CHLORIDE 0.9 % IV SOLN
2.0000 g | INTRAVENOUS | Status: AC
  Administered 2023-08-09: 2 g via INTRAVENOUS
  Filled 2023-08-07: qty 20

## 2023-08-07 MED ORDER — SODIUM CHLORIDE 0.9 % IV SOLN
2.0000 g | INTRAVENOUS | Status: AC
  Administered 2023-08-08: 2 g via INTRAVENOUS
  Filled 2023-08-07: qty 20

## 2023-08-07 MED ORDER — DAPTOMYCIN-SODIUM CHLORIDE 500-0.9 MG/50ML-% IV SOLN
500.0000 mg | Freq: Every day | INTRAVENOUS | Status: AC
  Administered 2023-08-08: 500 mg via INTRAVENOUS
  Filled 2023-08-07: qty 50

## 2023-08-07 MED ORDER — DAPTOMYCIN-SODIUM CHLORIDE 500-0.9 MG/50ML-% IV SOLN
500.0000 mg | Freq: Every day | INTRAVENOUS | Status: AC
  Administered 2023-08-10: 500 mg via INTRAVENOUS
  Filled 2023-08-07: qty 50

## 2023-08-07 MED ORDER — SODIUM CHLORIDE 0.9 % IV SOLN
2.0000 g | INTRAVENOUS | Status: AC
  Administered 2023-08-07: 2 g via INTRAVENOUS
  Filled 2023-08-07: qty 20

## 2023-08-07 MED ORDER — HYDRALAZINE HCL 20 MG/ML IJ SOLN
5.0000 mg | Freq: Three times a day (TID) | INTRAMUSCULAR | Status: AC | PRN
  Administered 2023-08-08: 5 mg via INTRAVENOUS
  Filled 2023-08-07: qty 1

## 2023-08-07 MED ORDER — DAPTOMYCIN-SODIUM CHLORIDE 500-0.9 MG/50ML-% IV SOLN
500.0000 mg | Freq: Every day | INTRAVENOUS | Status: AC
  Administered 2023-08-09: 500 mg via INTRAVENOUS
  Filled 2023-08-07: qty 50

## 2023-08-07 MED ORDER — DAPTOMYCIN IV (FOR PTA / DISCHARGE USE ONLY): 500.0000 mg | INTRAVENOUS | 0 refills | Status: DC

## 2023-08-07 MED ORDER — SODIUM CHLORIDE 0.9 % IV SOLN
2.0000 g | INTRAVENOUS | Status: AC
  Administered 2023-08-10: 2 g via INTRAVENOUS
  Filled 2023-08-07: qty 20

## 2023-08-07 MED ORDER — CEFTRIAXONE IV (FOR PTA / DISCHARGE USE ONLY): 2.0000 g | INTRAVENOUS | 0 refills | Status: DC

## 2023-08-07 NOTE — TOC Progression Note (Signed)
Transition of Care [facility]) - Progression Note    Patient Details  Name: Ariel Lambert MRN: [id number] Date of Birth: Sep 15, 1936  Transition of Care [facility]) CM/SW Contact  Otelia Santee, LCSW Phone Number: 08/07/2023, 10:47 AM  Clinical Narrative:    Pt will be staying with her daughter, [name] at [address] [place] [address] at discharge.  Referral has been faxed to [facility] in [place], [place] [phone]) for home health PT, RN, aide services.  TOC will follow up with [facility] once referral has been reviewed to confirm home health services.  [facility] continuing to follow for IV abx equipment and infusions at discharge.  Expected Discharge Plan: Home w Home Health Services Barriers to Discharge: Continued Medical Work up  Expected Discharge Plan and Services In-house Referral: Clinical Social Work Discharge Planning Services: NA Post Acute Care Choice: Home Health, Durable Medical Equipment Living arrangements for the past 2 months: Single Family Home                                       Social Determinants of Health (SDOH) Interventions SDOH Screenings   Food Insecurity: No Food Insecurity (08/04/2023)  Housing: Patient Declined (08/04/2023)  Transportation Needs: No Transportation Needs (08/04/2023)  Utilities: Not At Risk (08/04/2023)  Tobacco Use: Medium Risk (08/04/2023)    Readmission Risk Interventions    08/06/2023    2:04 PM  Readmission Risk Prevention Plan  Transportation Screening Complete  PCP or Specialist Appt within 5-7 Days Complete  Home Care Screening Complete  Medication Review (RN CM) Complete

## 2023-08-07 NOTE — Evaluation (Signed)
Occupational Therapy Evaluation Patient Details Name: Ariel Lambert MRN: [id number] DOB: 12/03/1935 Today's Date: 08/07/2023   History of Present Illness 87 yo female admitted 08/04/23 with fever, AMS,,confusion. Patient has had ongoing right hip  infections. PMH: R hip hemiarthroplasty anterior approach on 02/06/23 , S/P Iand D  right hip,HTN, hyper thyroidism, COPD, STroke.  MRI -Some enhancement in the discs at L4-L5 and L5-S1, with  irregularity of the adjacent endplates at L5-S1 and small foci of  increased T2 signal and enhancement at the L4 endplate, which could  represent early discitis-osteomyelitis versus the sequela of  degenerative disc disease   Clinical Impression   Prior to hospital admission, pt was using rollator for mobility in the home, hx of fall early summer 2024/fall 2024, and requires assist from daughter for LB dressing (socks). Pt is planning on moving in with family located in [place] that can provide 24/7 assist  Pt currently requires +2 assistance for safety for functional mobility with RW as pt has sudden posterior LOB with movements that present similar to ataxia.  Balance and overall motor coordination improved with increased reps of sit <> stand transfers. Pt able to take  ~4 steps forwards/backwards with minA + RW, 2 LOB overall requiring max A to correct. Currently setup for grooming tasks in seated position, minA for UB/LB dressing and +2 mod-max A for stand pivot to [facility]. Educated pt, daughter and RN on safe toilet transfer recommendations (BSC placed in bathroom). RN updated of pt's mention of L shoulder sharp pain and bilat LE numbness that improved after bouts of standing (see flowsheet for further details).  Pt would benefit from skilled OT services to address noted impairments and functional limitations (see below for any additional details) in order to maximize safety and independence while minimizing falls risk and caregiver burden. Anticipate the need for  follow up [facility] OT services upon acute hospital DC.        If plan is discharge home, recommend the following: Two people to help with walking and/or transfers;A lot of help with bathing/dressing/bathroom;Assistance with cooking/housework;Direct supervision/assist for medications management;Direct supervision/assist for financial management;Assist for transportation;Help with stairs or ramp for entrance    Functional Status Assessment  Patient has had a recent decline in their functional status and demonstrates the ability to make significant improvements in function in a reasonable and predictable amount of time.  Equipment Recommendations  None recommended by OT (has all necessary DME (or family to provide in [place]))       Precautions / Restrictions Precautions Precautions: Fall Precaution Comments: ataxic Restrictions Weight Bearing Restrictions: No      Mobility Bed Mobility               General bed mobility comments: in recliner    Transfers Overall transfer level: Needs assistance Equipment used: Rolling walker (2 wheels) Transfers: Sit to/from Stand Sit to Stand: Max assist, +2 safety/equipment           General transfer comment: min A to power up to stand, requires max A to correct sudden posterior LOB once in standing. Pt able to march in place seated and standing, sudden ataxic presentation in RLE with increased hip activation. Pt completes 4x sit to stand transfers, improving each time with gross motor coordination, able to perform without cues to push up from recliner. Given increased reps, pt gradually improves with steadiness. 2 LOB overall, max A to correct.      Balance Overall balance assessment: Needs assistance Sitting-balance support:  Bilateral upper extremity supported, Feet supported Sitting balance-Leahy Scale: Fair     Standing balance support: Bilateral upper extremity supported, Reliant on assistive device for balance, During functional  activity Standing balance-Leahy Scale: Poor Standing balance comment: able to stand and march in place with min A for support and safety. takes forward/backward steps using RW, close minA for safety throughout. cues for increased step length.                           ADL either performed or assessed with clinical judgement   ADL Overall ADL's : Needs assistance/impaired Eating/Feeding: Set up;Sitting   Grooming: Set up;Sitting   Upper Body Bathing: Set up;Sitting   Lower Body Bathing: Minimal assistance;Sitting/lateral leans;Sit to/from stand   Upper Body Dressing : Minimal assistance;Sitting   Lower Body Dressing: Minimal assistance;Sit to/from stand;Sitting/lateral leans   Toilet Transfer: +2 for safety/equipment;Cueing for sequencing;Cueing for safety;Stand-pivot;BSC/3in1;Rolling walker (2 wheels) Toilet Transfer Details (indicate cue type and reason): +2 for safety as pt has sudden posterior LOB and R leg ataxic presentation. Once standing, pt LOB posteriorly, requiring max A to correct Toileting- Clothing Manipulation and Hygiene: Maximal assistance;Sit to/from stand;Sitting/lateral lean;Cueing for sequencing;Cueing for safety       Functional mobility during ADLs: +2 for safety/equipment;Rolling walker (2 wheels);Cueing for sequencing;Cueing for safety General ADL Comments: Pt functioning below baseline with ADL performane. Self reports feeling weaker than normal given her time in bed. Educated pt, daughter [name], and RN about safe toilet transfer recommendations (+2 stand pivot to [facility] given pt's sudden LOB in standing).     Vision Baseline Vision/History: 1 Wears glasses Patient Visual Report: No change from baseline              Pertinent Vitals/Pain Pain Assessment Pain Assessment: No/denies pain (pt denying pain currently, but describing prior pain in L shoulder with "sharp, stabbing going down left side" that started within the last 24h. Pt did not tell RN  or daughter this was happening until OT eval. RN notified, and in room to assess)     Extremity/Trunk Assessment Upper Extremity Assessment Upper Extremity Assessment: RUE deficits/detail;Overall WFL for tasks assessed (ROM WFL BUE; fair + bilat grip strength)   Lower Extremity Assessment Lower Extremity Assessment: Defer to PT evaluation RLE Deficits / Details: decreased hip flexion in sitting,  reports decreased sensation in ankle to toes, appreciates LT LLE Deficits / Details: WFL AROM, reports decreased senastion inankle to toes, appreciates light touch   Cervical / Trunk Assessment Cervical / Trunk Assessment: Kyphotic   Communication Communication Communication: Hearing impairment   Cognition Arousal: Alert Behavior During Therapy: WFL for tasks assessed/performed Overall Cognitive Status: Within Functional Limits for tasks assessed                                       General Comments  Pt initally reporting that BLE were "numb and tingling" after standing. Sensation went away after time standing and increased reps    Exercises Exercises: General Lower Extremity General Exercises - Lower Extremity Hip Flexion/Marching: 10 reps, Both, AAROM, Seated, Standing   Shoulder Instructions      Home Living Family/patient expects to be discharged to:: Private residence Living Arrangements: Children Available Help at Discharge: Family;Available PRN/intermittently;Available 24 hours/day Type of Home: House Home Access: Stairs to enter [employer] of Steps: 3 Entrance Stairs-Rails: Left Home Layout:  One level     Bathroom Shower/Tub: Tub/shower unit;Walk-in shower   Bathroom Toilet: Standard Bathroom Accessibility: Yes How Accessible: Accessible via walker Home Equipment: Rolling Walker (2 wheels);BSC/3in1;Shower seat;Cane - single point;Grab bars - tub/shower;Rollator (4 wheels)          Prior Functioning/Environment Prior Level of Function  : Independent/Modified Independent             Mobility Comments: using rollator ADLs Comments: daugther helps with LB dressing and IADLs        OT Problem List: Decreased activity tolerance;Decreased range of motion;Decreased strength;Impaired balance (sitting and/or standing);Decreased coordination;Decreased knowledge of use of DME or AE      OT Treatment/Interventions: Self-care/ADL training;Therapeutic exercise;Neuromuscular education;Energy conservation;DME and/or AE instruction;Therapeutic activities;Patient/family education;Balance training    OT Goals(Current goals can be found in the care plan section) Acute Rehab OT Goals Patient Stated Goal: to move to [place] OT Goal Formulation: With patient/family Time For Goal Achievement: 08/21/23 Potential to Achieve Goals: Fair  OT Frequency: Min 1X/week       AM-PAC OT "6 Clicks" Daily Activity     Outcome Measure Help from another person eating meals?: None Help from another person taking care of personal grooming?: None Help from another person toileting, which includes using toliet, bedpan, or urinal?: A Lot Help from another person bathing (including washing, rinsing, drying)?: A Lot Help from another person to put on and taking off regular upper body clothing?: A Little Help from another person to put on and taking off regular lower body clothing?: A Lot 6 Click Score: 17   End of Session Equipment Utilized During Treatment: Rolling walker (2 wheels);Gait belt Nurse Communication: Mobility status;Other (comment) (recommendation for safe toilet transfer - BSC placed in bathroom by OT)  Activity Tolerance: Patient tolerated treatment well Patient left: in chair;with call bell/phone within reach;with family/visitor present;with nursing/sitter in room (no chair alarm in room (pt was up in recliner upon arrival). OT places chair alarm outside door as staff present in room with pt and alerts RN. Daughter in room with pt.)  OT  Visit Diagnosis: Unsteadiness on feet (R26.81);Ataxia, unspecified (R27.0);Muscle weakness (generalized) (M62.81);Other abnormalities of gait and mobility (R26.89)                Time: 1252-1340 OT Time Calculation (min): 48 min Charges:  OT General Charges $OT Visit: 1 Visit OT Evaluation $OT Eval Moderate Complexity: 1 Mod OT Treatments $Self Care/Home Management : 23-37 mins Aimar Shrewsbury L. Destenee Guerry, OTR/L  08/07/23, 2:11 PM

## 2023-08-07 NOTE — Progress Notes (Addendum)
PROGRESS NOTE    FANTASY COMPITELLO  WUJ:[id number] DOB: 12/19/1935 DOA: 08/04/2023 PCP: Mila Palmer, MD  Subjective: Pt seen and examined.  Right hip synovial fluid cx negative at 48 hours Blood cx negative at 72 hours  Right UE PICC placed yesterday.  ID ordering home IV ABX with daptomycin 8mg /kg/day plus ceftriaxone 2gm IV daily. End date:Jan 2nd 2025  Family still planning on taking pt to [place] at discharge.  Home health orders written.  Dtr lisa not at bedside today.   Hospital Course: HPI:  ADARAH LUEVANO is a 87 y.o. female with medical history significant of medical history as noted below.  Of particular importance is the fact that the patient had femoral fracture back in May that was repaired operatively.  Unfortunately patient postoperative course was complicated with a potential infection requiring a revision and patient has had poor healing of the skin since then.  Patient was last seen by orthopedic surgery yesterday when wound dehiscence site was sutured over.   History is obtained primarily from the daughter at the bedside but also from the patient herself to a good extent.  Patient was in her usual state of health till yesterday evening.  Patient is generally ambulatory with a walker, alert and awake and reasonably intelligently conversant with family members and friends.  She ago she occasionally goes out with friends for lunch etc.   However since this morning patient has been noted to be basically extremely sleepy/lethargic and laying recumbent in bed.  Patient was since discovered to have a temperature of 101 F at home.  Further patient has been complaining of low back pain.  There is no complaint from the patient of ear pain sore throat cough trouble breathing chest pain neck pain. patient has a mild headache but not as severe headache.  There is no report of increased pain from the right hip.  No new rash, although patient is generally flushed.   There is no report of diarrhea or vomiting.     [facility] at around noon time and cared for by the ER attending Dr. Particia Nearing.  Patient was diagnosed with sepsis has received empiric antibiotics, workup as noted below.  Medical evaluation is sought.   Patient/daughter feel that the patient has generally improved since her initial presentation this morning.  She is more comfortable headache has improved and she is more interactive.  Significant Events: Admitted 08/04/2023 for sepsis with encephalopathy   Significant Labs: Admission WBC 10.6, lactic acid 2.0, BP 87/64(responded to IVF)  Significant Imaging Studies: Admission MRI lumbar spine shows Some enhancement in the discs at L4-L5 and L5-S1, with irregularity of the adjacent endplates at L5-S1 and small foci of increased T2 signal and enhancement at the L4 endplate, which could represent early discitis-osteomyelitis versus the sequela of degenerative disc disease. Correlate with ESR and CRP. 2. Minimal enhancement in some of the left cauda equina nerve roots at L4 and L5, with some mild nerve root thickening, suggestive of arachnoiditis. 3. L3-L4 mild-to-moderate spinal canal stenosis. 4. L1-L2 and L2-L3 mild spinal canal stenosis. 5. L1-L2 mild spinal canal stenosis. 6. L5-S1 mild left neural foraminal narrowing. 7. Narrowing of the lateral recesses at L1-L2, L2-L3, L3-L4, and L4-L5 could affect the descending L2, L3, L4, and L5 nerve roots, respectively.  Antibiotic Therapy: Anti-infectives (From admission, onward)    Start     Dose/Rate Route Frequency Ordered Stop   08/05/23 1745  vancomycin (VANCOREADY) IVPB 750  mg/150 mL        750 mg 150 mL/hr over 60 Minutes Intravenous Every 24 hours 08/04/23 2326     08/05/23 0000  piperacillin-tazobactam (ZOSYN) IVPB 3.375 g        3.375 g 12.5 mL/hr over 240 Minutes Intravenous Every 8 hours 08/04/23 2259     08/04/23 1515  metroNIDAZOLE (FLAGYL) IVPB  500 mg        500 mg 100 mL/hr over 60 Minutes Intravenous  Once 08/04/23 1505 08/04/23 1748   08/04/23 1515  vancomycin (VANCOCIN) IVPB 1000 mg/200 mL premix        1,000 mg 200 mL/hr over 60 Minutes Intravenous  Once 08/04/23 1508 08/04/23 1847   08/04/23 1515  ceFEPIme (MAXIPIME) 2 g in sodium chloride 0.9 % 100 mL IVPB        2 g 200 mL/hr over 30 Minutes Intravenous  Once 08/04/23 1508 08/04/23 1733       Procedures:   Consultants: orthopedics    Assessment and Plan: * Sepsis with encephalopathy (HCC) 08-04-2023 Although no fever is recorded in the medical record, patient daughter reports a temperature of 101 F at home.  Patient has leukocytosis and patient's blood pressure was soft at initial presentation.  Patient received 1.5 L of fluid and blood pressure seems to have improved mentation seems to have improved.  Patient has gotten lactic acid of 2.0 initially improved.  At this time patient still appears to be dry and I will continue 100 cc an hour of fluid overnight with reevaluation in the morning.  With regards to the focus of infection.  Respiratory panel, limited as negative.  Principal concern is the fact that the patient is complaining of low back pain and obviously has had an open sore in the right hip since May.  I do not appreciate any cross-sectional imaging in the chart of the affected areas thus far.  I think it is time to do cross-sectional imaging of low back and right hip.  To best guide our further treatment.  In the interim I will continue patient on empiric vancomycin and zosyn for suspected osteomyelitis of the sites. Blood cultures pending.  Patient's mentation, per daughter has improved.  Patient did not demonstrate any marked confusion/hallucination or focal deficits to me.  No nuchal rigidity.  I think this is due to metabolic encephalopathy from sepsis we will continue with clinical monitoring in this regard.  08-05-2023 likely due to discitis. Was  present on admission. WBC 10.6, lactic acid 2.0. MRI lumbar spine shows discitis. Now resolved.  Lumbar discitis 08-05-2023 MRI lumbar spine shows discitis. Ortho consulted and order right hip aspiration. Remains on IV zosyn/vanco. Will enlist assistance of ID to guide abx management and duration.  08-06-2023 Right hip synovial fluid shows WBC 390 per ml. Does not appear to be septic joint. Septic joint criteria of >50K WBC per ml is the common criteria for septic arthritis.  Synovial fluid cx negative thus far.  Placed on cubicin and rocephin yesterday(08-05-2023) per ID consult. Expected duration of treatment will be 6 weeks. Changed to IV rocephin and IV daptomycin yesterday. Discussed with pt's dtr lisa hill. She states that her brother-in-law is a Careers adviser in [place]. Pt's family is [place] would be willing to take patient into their home and provide care for patient for the next 6-8 weeks. Pt's grand-dtr that lives in [place] is also a PA.  Discussed with dtr lisa that she would be in charge of getting a  homecare company name as our SW/CM do not know of any homecare companies in [place]. Homecare company would need to be able to provide IV ABX, HHRN and HHPT for patient. We could order a wheelchair for patient. Dtr would drive patient down to [place] on Monday, August 10, 2023. Pt could have a temporary foley catheter for the drive to [place] and this could be removed by family when pt arrives to their home in [place]. Discussed with Dr. Drue Second who gives approval to place PICC line today.  08-07-2023 PICC line placed 08-06-2023.  home IV ABX with daptomycin 8mg /kg/day plus ceftriaxone 2gm IV daily. End date:Jan 2nd 2025. Plan for DC on Monday, August 10, 2023  Hyponatremia 08-05-2023 repeat BMP in AM.  08-06-2023 BMP pending today.  08-07-2023 resolved. Na yesterday 139. Likely due to poor po intake.  Chronic obstructive pulmonary disease (HCC) 08-05-2023  stable.  Hyperthyroidism 08-05-2023 continue methimazole  Essential hypertension 08-05-2023 BP meds on hold due to hypotension yesterday in ER(87/64)  08-06-2023 BP elevated this AM. Will restart ARB today.  08-07-2023 BP improved after starting ARB. Will check CMP in AM.   DVT prophylaxis: enoxaparin (LOVENOX) injection 40 mg Start: 08/05/23 0800 SCDs Start: 08/04/23 2210     Code Status: Full Code Family Communication: spoke to pt's dtr lisa lhill on the phone Disposition Plan: will DC to pt's dtr home in [place] on Monday, August 09, 2024. Plan to insert foley catheter on Sunday night so pt does not have to get out of car to urinate on her way to [place]. Pt's son-in-law is a Careers adviser in [place] and can remove foley when pt arrives to [place]. Reason for continuing need for hospitalization: remains on IV ABX. Plan for DC on Monday, August 10, 2023.  Objective: Vitals:   08/07/23 0445 08/07/23 0603 08/07/23 0754 08/07/23 1156  BP: (!) 175/109 (!) 168/102  128/86  Pulse: 65   67  Resp: 16   18  Temp: 98.4 F (36.9 C)   98.5 F (36.9 C)  TempSrc: Oral     SpO2: 96%  95% 97%  Weight:      Height:        Intake/Output Summary (Last 24 hours) at 08/07/2023 1310 Last data filed at 08/07/2023 1230 Gross per 24 hour  Intake 577.75 ml  Output 1150 ml  Net -572.25 ml   Filed Weights   08/04/23 1426 08/05/23 1836  Weight: 56.5 kg 56.5 kg    Examination:  Physical Exam Vitals and nursing note reviewed.  Constitutional:      General: She is not in acute distress.    Appearance: She is not toxic-appearing.  HENT:     Head: Normocephalic and atraumatic.     Nose: Nose normal.  Eyes:     General: No scleral icterus. Cardiovascular:     Rate and Rhythm: Normal rate and regular rhythm.  Pulmonary:     Effort: Pulmonary effort is normal.  Abdominal:     General: Abdomen is flat. Bowel sounds are normal.  Musculoskeletal:     Comments: Right UE PICC line.   Skin:    General: Skin is warm and dry.     Capillary Refill: Capillary refill takes less than 2 seconds.  Neurological:     General: No focal deficit present.     Mental Status: She is alert and oriented to person, place, and time.     Data Reviewed: I have personally reviewed following labs and imaging studies  CBC: Recent  Labs  Lab 08/04/23 1448 08/05/23 0032  WBC 10.6* 7.1  NEUTROABS 9.9*  --   HGB 12.9 12.3  HCT 39.3 38.4  MCV 97.8 100.8*  PLT 243 188   Basic Metabolic Panel: Recent Labs  Lab 08/04/23 1448 08/04/23 2250 08/05/23 0032 08/06/23 0557  NA 138  --  132* 139  K 4.4  --  3.6 3.4*  CL 102  --  101 109  CO2 26  --  23 24  GLUCOSE 134*  --  101* 86  BUN 19  --  17 11  CREATININE 0.65 0.51 0.52 0.46  0.46  CALCIUM 9.4  --  8.3* 8.3*   GFR: Estimated Creatinine Clearance: 40.7 mL/min (by C-G formula based on SCr of 0.46 mg/dL). Liver Function Tests: Recent Labs  Lab 08/04/23 1448  AST 15  ALT 19  ALKPHOS 74  BILITOT 0.8  PROT 6.4*  ALBUMIN 3.5   Coagulation Profile: Recent Labs  Lab 08/04/23 1448  INR 1.1   Cardiac Enzymes: Recent Labs  Lab 08/05/23 1750  CKTOTAL 19*   Thyroid Function Tests: Recent Labs    08/05/23 0032  TSH 0.255*   Sepsis Labs: Recent Labs  Lab 08/04/23 1448 08/04/23 1732 08/04/23 2009 08/04/23 2215  LATICACIDVEN 1.8 2.0* 1.6 1.2    Recent Results (from the past 240 hour(s))  Culture, blood (Routine x 2)     Status: None (Preliminary result)   Collection Time: 08/04/23  2:29 PM   Specimen: BLOOD RIGHT ARM  Result Value Ref Range Status   Specimen Description   Final    BLOOD RIGHT ARM Performed at [facility], [address] [place], [place] [address]    Special Requests   Final    Blood Culture adequate volume BOTTLES DRAWN AEROBIC AND ANAEROBIC Performed at [facility], [address] [place], [place] [address]    Culture   Final    NO GROWTH 3 DAYS Performed  at [facility], [address], [place], [place] [address]    Report Status PENDING  Incomplete  Culture, blood (Routine x 2)     Status: None (Preliminary result)   Collection Time: 08/04/23  2:34 PM   Specimen: BLOOD LEFT ARM  Result Value Ref Range Status   Specimen Description   Final    BLOOD LEFT ARM Performed at [facility], [address] [place], [place] [address]    Special Requests   Final    Blood Culture adequate volume BOTTLES DRAWN AEROBIC AND ANAEROBIC Performed at [facility], [address] [place], [place] [address]    Culture   Final    NO GROWTH 3 DAYS Performed at [facility], [address] [place], [place] [address]    Report Status PENDING  Incomplete  Resp panel by RT-PCR (RSV, Flu A&B, Covid) Anterior Nasal Swab     Status: None   Collection Time: 08/04/23  4:03 PM   Specimen: Anterior Nasal Swab  Result Value Ref Range Status   SARS Coronavirus 2 by RT PCR NEGATIVE NEGATIVE Final    Comment: (NOTE) SARS-CoV-2 target nucleic acids are NOT DETECTED.  The SARS-CoV-2 RNA is generally detectable in upper respiratory specimens during the acute phase of infection. The lowest concentration of SARS-CoV-2 viral copies this assay can detect is 138 copies/mL. A negative result does not preclude SARS-Cov-2 infection and should not be used as the sole basis for treatment or other patient  management decisions. A negative result may occur with  improper specimen collection/handling, submission of specimen other than nasopharyngeal swab, presence of viral mutation(s) within the areas targeted by this assay, and inadequate number of viral copies(<138 copies/mL). A negative result must be combined with clinical observations, patient history, and epidemiological information. The expected result is Negative.  Fact Sheet for Patients:  BloggerCourse.com  Fact Sheet for Healthcare Providers:   SeriousBroker.it  This test is no t yet approved or cleared by the [place] FDA and  has been authorized for detection and/or diagnosis of SARS-CoV-2 by FDA under an Emergency Use Authorization (EUA). This EUA will remain  in effect (meaning this test can be used) for the duration of the COVID-19 declaration under Section 564(b)(1) of the Act, 21 U.S.C.section 360bbb-3(b)(1), unless the authorization is terminated  or revoked sooner.       Influenza A by PCR NEGATIVE NEGATIVE Final   Influenza B by PCR NEGATIVE NEGATIVE Final    Comment: (NOTE) The Xpert Xpress SARS-CoV-2/FLU/RSV plus assay is intended as an aid in the diagnosis of influenza from Nasopharyngeal swab specimens and should not be used as a sole basis for treatment. Nasal washings and aspirates are unacceptable for Xpert Xpress SARS-CoV-2/FLU/RSV testing.  Fact Sheet for Patients: BloggerCourse.com  Fact Sheet for Healthcare Providers: SeriousBroker.it  This test is not yet approved or cleared by the [place] FDA and has been authorized for detection and/or diagnosis of SARS-CoV-2 by FDA under an Emergency Use Authorization (EUA). This EUA will remain in effect (meaning this test can be used) for the duration of the COVID-19 declaration under Section 564(b)(1) of the Act, 21 U.S.C. section 360bbb-3(b)(1), unless the authorization is terminated or revoked.     Resp Syncytial Virus by PCR NEGATIVE NEGATIVE Final    Comment: (NOTE) Fact Sheet for Patients: BloggerCourse.com  Fact Sheet for Healthcare Providers: SeriousBroker.it  This test is not yet approved or cleared by the [place] FDA and has been authorized for detection and/or diagnosis of SARS-CoV-2 by FDA under an Emergency Use Authorization (EUA). This EUA will remain in effect (meaning this test can be used) for  the duration of the COVID-19 declaration under Section 564(b)(1) of the Act, 21 U.S.C. section 360bbb-3(b)(1), unless the authorization is terminated or revoked.  Performed at [facility], [address], [place], [place] [address]   Body fluid culture w Gram Stain     Status: None (Preliminary result)   Collection Time: 08/05/23  2:18 PM   Specimen: Synovium; Body Fluid  Result Value Ref Range Status   Specimen Description SYNOVIAL  Final   Special Requests R HIP  Final   Gram Stain NO WBC SEEN NO ORGANISMS SEEN   Final   Culture   Final    NO GROWTH 2 DAYS Performed at [facility], [address], [place], [place] [address]    Report Status PENDING  Incomplete     Radiology Studies: [place] EKG SITE RITE  Result Date: 08/06/2023 If Site Rite image not attached, placement could not be confirmed due to current cardiac rhythm.   Scheduled Meds:  acetaminophen  650 mg Oral Q4H while awake   aspirin EC  81 mg Oral Daily   Chlorhexidine Gluconate Cloth  6 each Topical Daily   enoxaparin (LOVENOX) injection  40 mg Subcutaneous Q24H   ezetimibe  10 mg Oral Daily   And   simvastatin  20 mg Oral Daily   irbesartan  75 mg  Oral Daily   methIMAzole  10 mg Oral Daily   mirtazapine  30 mg Oral QHS   mometasone-formoterol  2 puff Inhalation BID   montelukast  10 mg Oral QHS   sodium chloride flush  10-40 mL Intracatheter Q12H   sodium chloride flush  3 mL Intravenous Q12H   Continuous Infusions:  cefTRIAXone (ROCEPHIN)  IV Stopped (08/06/23 2224)   DAPTOmycin Stopped (08/06/23 1337)     LOS: 3 days   Time spent: 35 minutes  Carollee Herter, DO  [facility]  08/07/2023, 1:10 PM

## 2023-08-07 NOTE — Progress Notes (Signed)
Physical Therapy Treatment Patient Details Name: Ariel Lambert MRN: [id number] DOB: 03/23/1936 Today's Date: 08/07/2023   History of Present Illness 87 yo female admitted 08/04/23 with fever, AMS,,confusion. Patient has had ongoing right hip  infections. PMH: R hip hemiarthroplasty anterior approach on 02/06/23 , S/P Iand D  right hip,HTN, hyper thyroidism, COPD, STroke.  MRI -Some enhancement in the discs at L4-L5 and L5-S1, with  irregularity of the adjacent endplates at L5-S1 and small foci of  increased T2 signal and enhancement at the L4 endplate, which could  represent early discitis-osteomyelitis versus the sequela of  degenerative disc disease    PT Comments  Pt sitting in recliner most of the day. Pt reports feeling much better in regards to mobility then earlier.  Pt able to stand and ambulate 120 ft in hallway with RW.  No ataxic gait or movement observed this afternoon however followed pt with recliner for safety.  Pt and family anticipate d/c on Monday (driving to [place] to stay with daughter and son-in-law and setting up care there).    If plan is discharge home, recommend the following: A little help with bathing/dressing/bathroom;Assistance with cooking/housework;Assist for transportation;Help with stairs or ramp for entrance;A little help with walking and/or transfers   Can travel by private vehicle        Equipment Recommendations  Wheelchair (measurements PT);Wheelchair cushion (measurements PT)    Recommendations for Other Services       Precautions / Restrictions Precautions Precautions: Fall Precaution Comments: ataxic Restrictions Weight Bearing Restrictions: No     Mobility  Bed Mobility Overal bed mobility: Needs Assistance Bed Mobility: Sit to Supine       Sit to supine: Mod assist   General bed mobility comments: pt in recliner on arrival; assist for bil LEs onto bed    Transfers Overall transfer level: Needs assistance Equipment used:  Rolling walker (2 wheels) Transfers: Sit to/from Stand Sit to Stand: Contact guard assist           General transfer comment: cues for UE and LE positioning    Ambulation/Gait Ambulation/Gait assistance: Contact guard assist, +2 safety/equipment Gait Distance (Feet): 120 Feet Assistive device: Rolling walker (2 wheels) Gait Pattern/deviations: Step-through pattern, Decreased stride length, Narrow base of support       General Gait Details: small reciprocal steps, cues for posture as tolerated; recliner following for safety however not required; no ataxic movement observed this afternoon   Stairs             Wheelchair Mobility     Tilt Bed    Modified Rankin (Stroke Patients Only)       Balance                                            Cognition Arousal: Alert Behavior During Therapy: WFL for tasks assessed/performed Overall Cognitive Status: Within Functional Limits for tasks assessed                                          Exercises      General Comments General comments (skin integrity, edema, etc.): Pt initally reporting that BLE were "numb and tingling" after standing. Sensation went away after time standing and increased reps      Pertinent Vitals/Pain Pain  Assessment Pain Assessment: No/denies pain    Home Living Family/patient expects to be discharged to:: Private residence Living Arrangements: Children Available Help at Discharge: Family;Available PRN/intermittently;Available 24 hours/day Type of Home: House Home Access: Stairs to enter Entrance Stairs-Rails: Left Entrance Stairs-Number of Steps: 3   Home Layout: One level Home Equipment: Agricultural consultant (2 wheels);BSC/3in1;Shower seat;Cane - single point;Grab bars - tub/shower;Rollator (4 wheels)      Prior Function            PT Goals (current goals can now be found in the care plan section) Progress towards PT goals: Progressing toward  goals    Frequency    Min 1X/week      PT Plan      Co-evaluation              AM-PAC PT "6 Clicks" Mobility   Outcome Measure  Help needed turning from your back to your side while in a flat bed without using bedrails?: A Lot Help needed moving from lying on your back to sitting on the side of a flat bed without using bedrails?: A Lot Help needed moving to and from a bed to a chair (including a wheelchair)?: A Little Help needed standing up from a chair using your arms (e.g., wheelchair or bedside chair)?: A Little Help needed to walk in hospital room?: A Little Help needed climbing 3-5 steps with a railing? : A Lot 6 Click Score: 15    End of Session Equipment Utilized During Treatment: Gait belt Activity Tolerance: Patient tolerated treatment well Patient left: in bed;with call bell/phone within reach;with family/visitor present   PT Visit Diagnosis: Difficulty in walking, not elsewhere classified (R26.2);Muscle weakness (generalized) (M62.81)     Time: 1425-1440 PT Time Calculation (min) (ACUTE ONLY): 15 min  Charges:    $Gait Training: 8-22 mins PT General Charges $$ ACUTE PT VISIT: 1 Visit                     Paulino Door, DPT Physical Therapist Acute Rehabilitation Services Office: [phone]    Janan Halter [place] 08/07/2023, 3:47 PM

## 2023-08-07 NOTE — Plan of Care (Signed)
  Problem: Education: Goal: Knowledge of General Education information will improve Description Including pain rating scale, medication(s)/side effects and non-pharmacologic comfort measures Outcome: Progressing   

## 2023-08-07 NOTE — Progress Notes (Signed)
[facility]    Date of Admission:  08/04/2023   Total days of antibiotics 4   ID: Ariel Lambert is a 87 y.o. female with lumbro sacral discitis, hx of right hip hemiarthroplasty in may but poor healing.  Principal Problem:   Sepsis with encephalopathy [facility]) Active Problems:   Essential hypertension   Hyperthyroidism   Chronic obstructive pulmonary disease (HCC)   Lumbar discitis   Hyponatremia    Subjective: Afebrile, denies back pain, eating well. Did not have issues with her picc line placed yesterday to right upper arm.  Medications:   acetaminophen  650 mg Oral Q4H while awake   aspirin EC  81 mg Oral Daily   Chlorhexidine Gluconate Cloth  6 each Topical Daily   enoxaparin (LOVENOX) injection  40 mg Subcutaneous Q24H   ezetimibe  10 mg Oral Daily   And   simvastatin  20 mg Oral Daily   irbesartan  75 mg Oral Daily   methIMAzole  10 mg Oral Daily   mirtazapine  30 mg Oral QHS   mometasone-formoterol  2 puff Inhalation BID   montelukast  10 mg Oral QHS   sodium chloride flush  10-40 mL Intracatheter Q12H   sodium chloride flush  3 mL Intravenous Q12H    Objective: Vital signs in last 24 hours: Temp:  [98.4 F (36.9 C)-98.6 F (37 C)] 98.5 F (36.9 C) (11/22 1156) Pulse Rate:  [65-73] 67 (11/22 1156) Resp:  [16-18] 18 (11/22 1156) BP: (128-175)/(78-109) 128/86 (11/22 1156) SpO2:  [94 %-98 %] 97 % (11/22 1156) Physical Exam  Constitutional:  oriented to person, place.  appears well-developed and well-nourished. No distress.  HENT: [place]/AT, PERRLA, no scleral icterus Mouth/Throat: Oropharynx is clear and moist. No oropharyngeal exudate.  Cardiovascular: Normal rate, regular rhythm and normal heart sounds. Exam reveals no gallop and no friction rub.  No murmur heard.  Pulmonary/Chest: Effort normal and breath sounds normal. No respiratory distress.  has no wheezes.  Ext: right Picc line is c/d/i Lymphadenopathy: no cervical  adenopathy. No axillary adenopathy Neurological: alert and oriented to person, place, and time.  Skin: Skin is warm and dry. No rash noted. No erythema.  Psychiatric: a normal mood and affect.  behavior is normal.    Lab Results Recent Labs    08/05/23 0032 08/06/23 0557  WBC 7.1  --   HGB 12.3  --   HCT 38.4  --   NA 132* 139  K 3.6 3.4*  CL 101 109  CO2 23 24  BUN 17 11  CREATININE 0.52 0.46  0.46    Sedimentation Rate Recent Labs    08/05/23 0825  ESRSEDRATE 17   C-Reactive Protein Recent Labs    08/05/23 0825  CRP 10.2*    Microbiology: reviewed Studies/Results: [place] EKG SITE RITE  Result Date: 08/06/2023 If Site Rite image not attached, placement could not be confirmed due to current cardiac rhythm.    Assessment/Plan: 87yo F with possible early discitis-osteomyelitis to L4-L5-S1.  Currently on daptomycin and ceftriaxone for culture negative discitis - plan to treat for 6 wk ( closer to 7 wk) of IV therapy, then we will see back in the ID clinic on jan 6th for transition to orals and repeat imaging  Spent in discussing treatment plan with patient, side effects of abtx, precautions, and coordination of care.  - opat orders below: Diagnosis: discitis   Culture Result: culture negative   Allergies  Allergies  Allergen Reactions   Cetirizine Other (See Comments)      Stomach pain   Codeine Itching      Severe itching   Hydrocodone Itching   Meperidine Hcl Diarrhea and Nausea And Vomiting      (Demerol)   Nabumetone Rash      (Relafen)   Naproxen Itching and Rash   Sulfa Antibiotics Rash   Sulfonamide Derivatives Rash        OPAT Orders Discharge antibiotics to be given via PICC line Discharge antibiotics: Per pharmacy protocol daptomycin 8mg /kg/day plus ceftriaxone 2gm IV daily Aim for Vancomycin trough 15-20 or AUC 400-550 (unless otherwise indicated) Duration: 6 wk End Date: Jan 6th 2025   [facility] Care Per Protocol:    Home health RN for IV administration and teaching; PICC line care and labs.     Labs weekly while on IV antibiotics: __x CBC with differential _x_ BMP   _x_ CRP _x_ ESR   x__ CK   _x_ Please pull PIC at completion of IV antibiotics     Fax weekly labs to [phone]    -- I have personally spent 50 minutes involved in face-to-face and non-face-to-face activities for this patient on the day of the visit. Professional time spent includes the following activities: Preparing to see the patient (review of tests), Obtaining and/or reviewing separately obtained history (admission/discharge record), Performing a medically appropriate examination and/or evaluation , Ordering medications/tests/procedures, referring and communicating with other health care professionals, Documenting clinical information in the EMR, Independently interpreting results (not separately reported), Communicating results to the patient/family/caregiver, Counseling and educating the patient/family/caregiver and Care coordination (not separately reported).    [facility] Pager: [phone]  08/07/2023, 3:24 PM

## 2023-08-07 NOTE — Plan of Care (Signed)
No acute events this shift. The patient has rested comfortably throughout the shift. Her pain has been well controlled with scheduled tylenol. VSS. Fall precautions in place. Will continue to monitor.  Problem: Education: Goal: Knowledge of General Education information will improve Description: Including pain rating scale, medication(s)/side effects and non-pharmacologic comfort measures Outcome: Progressing   Problem: Health Behavior/Discharge Planning: Goal: Ability to manage health-related needs will improve Outcome: Progressing   Problem: Clinical Measurements: Goal: Ability to maintain clinical measurements within normal limits will improve Outcome: Progressing Goal: Will remain free from infection Outcome: Progressing Goal: Diagnostic test results will improve Outcome: Progressing Goal: Respiratory complications will improve Outcome: Progressing Goal: Cardiovascular complication will be avoided Outcome: Progressing   Problem: Activity: Goal: Risk for activity intolerance will decrease Outcome: Progressing   Problem: Nutrition: Goal: Adequate nutrition will be maintained Outcome: Progressing   Problem: Coping: Goal: Level of anxiety will decrease Outcome: Progressing   Problem: Elimination: Goal: Will not experience complications related to bowel motility Outcome: Progressing Goal: Will not experience complications related to urinary retention Outcome: Progressing   Problem: Pain Management: Goal: General experience of comfort will improve Outcome: Progressing   Problem: Safety: Goal: Ability to remain free from injury will improve Outcome: Progressing   Problem: Skin Integrity: Goal: Risk for impaired skin integrity will decrease Outcome: Progressing

## 2023-08-08 DIAGNOSIS — I10 Essential (primary) hypertension: Secondary | ICD-10-CM | POA: Diagnosis not present

## 2023-08-08 DIAGNOSIS — J449 Chronic obstructive pulmonary disease, unspecified: Secondary | ICD-10-CM | POA: Diagnosis not present

## 2023-08-08 DIAGNOSIS — A419 Sepsis, unspecified organism: Secondary | ICD-10-CM | POA: Diagnosis not present

## 2023-08-08 DIAGNOSIS — M4646 Discitis, unspecified, lumbar region: Secondary | ICD-10-CM | POA: Diagnosis not present

## 2023-08-08 LAB — COMPREHENSIVE METABOLIC PANEL
ALT: 43 U/L (ref 0–44)
AST: 41 U/L (ref 15–41)
Albumin: 2.9 g/dL — ABNORMAL LOW (ref 3.5–5.0)
Alkaline Phosphatase: 74 U/L (ref 38–126)
Anion gap: 8 (ref 5–15)
BUN: 16 mg/dL (ref 8–23)
CO2: 26 mmol/L (ref 22–32)
Calcium: 8.6 mg/dL — ABNORMAL LOW (ref 8.9–10.3)
Chloride: 105 mmol/L (ref 98–111)
Creatinine, Ser: 0.5 mg/dL (ref 0.44–1.00)
GFR, Estimated: >60 mL/min (ref >60)
Glucose, Bld: 90 mg/dL (ref 70–99)
Potassium: 4.1 mmol/L (ref 3.5–5.1)
Sodium: 139 mmol/L (ref 135–145)
Total Bilirubin: <0.2 mg/dL (ref <1.2)
Total Protein: 6.1 g/dL — ABNORMAL LOW (ref 6.5–8.1)

## 2023-08-08 LAB — TROPONIN I (HIGH SENSITIVITY)
Troponin I (High Sensitivity): 18 ng/L — ABNORMAL HIGH (ref <18)
Troponin I (High Sensitivity): 15 ng/L (ref <18)

## 2023-08-08 MED ORDER — CALCIUM CARBONATE ANTACID 500 MG PO CHEW
1.0000 | CHEWABLE_TABLET | Freq: Four times a day (QID) | ORAL | Status: DC | PRN
  Administered 2023-08-09: 200 mg via ORAL
  Filled 2023-08-08: qty 1

## 2023-08-08 MED ORDER — MORPHINE SULFATE (PF) 2 MG/ML IV SOLN
1.0000 mg | Freq: Once | INTRAVENOUS | Status: AC
  Administered 2023-08-08: 1 mg via INTRAVENOUS
  Filled 2023-08-08: qty 1

## 2023-08-08 NOTE — Progress Notes (Signed)
PT c/o medial chest pain 9/10  B/P 170/124. Adm PRN HDLZ ON call notified, morphine and EKG (-). Trop ordered--> result 18, reordered for 0700. Pt last stated pain 3. No other s/s of MI

## 2023-08-08 NOTE — Progress Notes (Signed)
Pharmacy Antibiotic Note  Ariel Lambert is a 87 y.o. female admitted on 08/04/2023 with sepsis, early discitis. Patient had right femoral neck fracture s/p hemiarthroplasty in may 2024 complicated with ecoli superficial wound infection s/p I xD in August 2024. She was seen by orthopedics for wound dehiscence on 11/18 and has continued to feel poorly since then. MRI of spine is showing L4-S1 early discitis. She was empirically started on vancomycin and Zosyn therapy on 11/19. Pharmacy consulted for daptomycin dosing, per ID, to minimize AKI risk.  ID following and OPAT note complete - planning daptomycin/ceftriaxone therapy thru 09/17/23. Most recent CK 19 on 11/20--stable.  Plan: Continue IV daptomycin 500mg  q24hrs thru 09/17/23 Continue IV ceftriaxone 2g q24hrs thru 09/17/23 Check weekly CK  Height: 5\' 2"  (157.5 cm) Weight: 56.5 kg (124 lb 9 oz) IBW/kg (Calculated) : 50.1  Temp (24hrs), Avg:98.2 F (36.8 C), Min:97.9 F (36.6 C), Max:98.5 F (36.9 C)  Recent Labs  Lab 08/04/23 1448 08/04/23 1732 08/04/23 2009 08/04/23 2215 08/04/23 2250 08/05/23 0032 08/06/23 0557 08/08/23 0317  WBC 10.6*  --   --   --   --  7.1  --   --   CREATININE 0.65  --   --   --  0.51 0.52 0.46  0.46 0.50  LATICACIDVEN 1.8 2.0* 1.6 1.2  --   --   --   --     Estimated Creatinine Clearance: 40.7 mL/min (by C-G formula based on SCr of 0.5 mg/dL).    Allergies  Allergen Reactions   Cetirizine Other (See Comments)    Stomach pain   Codeine Itching    Severe itching   Hydrocodone Itching   Meperidine Hcl Diarrhea and Nausea And Vomiting    (Demerol)   Nabumetone Rash    (Relafen)   Naproxen Itching and Rash   Sulfa Antibiotics Rash   Sulfonamide Derivatives Rash    Antibiotics this admission: 11/19 cefepime x1 11/19 Flagyl x1 11/19 vancomycin x1 11/20 Zosyn x1 11/20 daptomycin >> (09/17/23) 11/20 ceftriaxone >> (09/17/23)  Microbiology data: 11/19 Bcx: NG x4 days 11/20 R hip aspirate  cx: NG x2 days   Cherylin Mylar, PharmD Clinical Pharmacist  11/23/20248:58 AM

## 2023-08-08 NOTE — Progress Notes (Signed)
Mobility Specialist - Progress Note   08/08/23 0842  Mobility  Activity Transferred from bed to chair  Level of Assistance Contact guard assist, steadying assist  Assistive Device Front wheel walker  Range of Motion/Exercises Active  Activity Response Tolerated well  Mobility Referral Yes  $Mobility charge 1 Mobility  Mobility Specialist Start Time (ACUTE ONLY) [id number]  Mobility Specialist Stop Time (ACUTE ONLY) 0841  Mobility Specialist Time Calculation (min) (ACUTE ONLY) 9 min   Received in bed and agreed to mobility. No issues, wants to ambulate later. Left in chair with all needs met.  Marilynne Halsted Mobility Specialist

## 2023-08-08 NOTE — Progress Notes (Signed)
    Patient Name: Ariel Lambert           DOB: 06-Oct-1935  MRN: [id number]      Admission Date: 08/04/2023  Attending Provider: Carollee Herter, DO  Primary Diagnosis: Sepsis with encephalopathy [facility])   Level of care: Telemetry    CROSS COVER NOTE   Date of Service   08/08/2023   [name], 87 y.o. female, was admitted on 08/04/2023 for Sepsis with encephalopathy (HCC).    HPI/Events of Note   Chest Pain Sharp 8/10 mid- sternal chest pain, non- radiating. Does endorse some front chest wall tenderness, but says it feels different from the original chest pain.She mentions that she has similar CP at home that is short in duration and "go away by itself."  Denies lightheadedness, dizziness, diaphoresis, palpitations, SOB, abdominal pain, heartburn, N/V.     RN reassessed vitals, treated elevated BP (SBP 170's) with IV hydralazine   Addendum- CP free, no further discomfort. Troponin 18, repeat due at 0700.    Interventions/ Plan   EKG- NSR, nonspecific ST abnormality  Troponin- pending  IV Morphine, 1 mg        Anthoney Harada, DNP, [facility] [place]

## 2023-08-08 NOTE — Progress Notes (Signed)
PROGRESS NOTE    Ariel Lambert  NFA:[id number] DOB: [birth year] DOA: 08/04/2023 PCP: Mila Palmer, MD  Subjective: Pt seen and examined.  Right hip synovial fluid cx negative at 72 hours Blood cx negative at 96 hours  Family still planning on taking pt to [place] at discharge.  Dtr lisa at bedside today.  Plan to insert foley on Sunday night. Will get Monday AM dose of IV abx early. And then DC from hospital.   Hospital Course: HPI:  Ariel Lambert is a 87 y.o. female with medical history significant of medical history as noted below.  Of particular importance is the fact that the patient had femoral fracture back in May that was repaired operatively.  Unfortunately patient postoperative course was complicated with a potential infection requiring a revision and patient has had poor healing of the skin since then.  Patient was last seen by orthopedic surgery yesterday when wound dehiscence site was sutured over.   History is obtained primarily from the daughter at the bedside but also from the patient herself to a good extent.  Patient was in her usual state of health till yesterday evening.  Patient is generally ambulatory with a walker, alert and awake and reasonably intelligently conversant with family members and friends.  She ago she occasionally goes out with friends for lunch etc.   However since this morning patient has been noted to be basically extremely sleepy/lethargic and laying recumbent in bed.  Patient was since discovered to have a temperature of 101 F at home.  Further patient has been complaining of low back pain.  There is no complaint from the patient of ear pain sore throat cough trouble breathing chest pain neck pain. patient has a mild headache but not as severe headache.  There is no report of increased pain from the right hip.  No new rash, although patient is generally flushed.  There is no report of diarrhea or vomiting.     Patient was  initially taken [facility] at around noon time and cared for by the ER attending Dr. Particia Nearing.  Patient was diagnosed with sepsis has received empiric antibiotics, workup as noted below.  Medical evaluation is sought.   Patient/daughter feel that the patient has generally improved since her initial presentation this morning.  She is more comfortable headache has improved and she is more interactive.  Significant Events: Admitted 08/04/2023 for sepsis with encephalopathy   Significant Labs: Admission WBC 10.6, lactic acid 2.0, BP 87/64(responded to IVF)  Significant Imaging Studies: Admission MRI lumbar spine shows Some enhancement in the discs at L4-L5 and L5-S1, with irregularity of the adjacent endplates at L5-S1 and small foci of increased T2 signal and enhancement at the L4 endplate, which could represent early discitis-osteomyelitis versus the sequela of degenerative disc disease. Correlate with ESR and CRP. 2. Minimal enhancement in some of the left cauda equina nerve roots at L4 and L5, with some mild nerve root thickening, suggestive of arachnoiditis. 3. L3-L4 mild-to-moderate spinal canal stenosis. 4. L1-L2 and L2-L3 mild spinal canal stenosis. 5. L1-L2 mild spinal canal stenosis. 6. L5-S1 mild left neural foraminal narrowing. 7. Narrowing of the lateral recesses at L1-L2, L2-L3, L3-L4, and L4-L5 could affect the descending L2, L3, L4, and L5 nerve roots, respectively.  Antibiotic Therapy: Anti-infectives (From admission, onward)    Start     Dose/Rate Route Frequency Ordered Stop   08/05/23 1745  vancomycin (VANCOREADY) IVPB 750 mg/150 mL  750 mg 150 mL/hr over 60 Minutes Intravenous Every 24 hours 08/04/23 2326     08/05/23 0000  piperacillin-tazobactam (ZOSYN) IVPB 3.375 g        3.375 g 12.5 mL/hr over 240 Minutes Intravenous Every 8 hours 08/04/23 2259     08/04/23 1515  metroNIDAZOLE (FLAGYL) IVPB 500 mg        500 mg 100 mL/hr over 60 Minutes Intravenous   Once 08/04/23 1505 08/04/23 1748   08/04/23 1515  vancomycin (VANCOCIN) IVPB 1000 mg/200 mL premix        1,000 mg 200 mL/hr over 60 Minutes Intravenous  Once 08/04/23 1508 08/04/23 1847   08/04/23 1515  ceFEPIme (MAXIPIME) 2 g in sodium chloride 0.9 % 100 mL IVPB        2 g 200 mL/hr over 30 Minutes Intravenous  Once 08/04/23 1508 08/04/23 1733       Procedures:   Consultants: orthopedics    Assessment and Plan: * Sepsis with encephalopathy (HCC) 08-04-2023 Although no fever is recorded in the medical record, patient daughter reports a temperature of 101 F at home.  Patient has leukocytosis and patient's blood pressure was soft at initial presentation.  Patient received 1.5 L of fluid and blood pressure seems to have improved mentation seems to have improved.  Patient has gotten lactic acid of 2.0 initially improved.  At this time patient still appears to be dry and I will continue 100 cc an hour of fluid overnight with reevaluation in the morning.  With regards to the focus of infection.  Respiratory panel, limited as negative.  Principal concern is the fact that the patient is complaining of low back pain and obviously has had an open sore in the right hip since May.  I do not appreciate any cross-sectional imaging in the chart of the affected areas thus far.  I think it is time to do cross-sectional imaging of low back and right hip.  To best guide our further treatment.  In the interim I will continue patient on empiric vancomycin and zosyn for suspected osteomyelitis of the sites. Blood cultures pending.  Patient's mentation, per daughter has improved.  Patient did not demonstrate any marked confusion/hallucination or focal deficits to me.  No nuchal rigidity.  I think this is due to metabolic encephalopathy from sepsis we will continue with clinical monitoring in this regard.  08-05-2023 likely due to discitis. Was present on admission. WBC 10.6, lactic acid 2.0. MRI lumbar spine  shows discitis. Now resolved.  Lumbar discitis 08-05-2023 MRI lumbar spine shows discitis. Ortho consulted and order right hip aspiration. Remains on IV zosyn/vanco. Will enlist assistance of ID to guide abx management and duration.  08-06-2023 Right hip synovial fluid shows WBC 390 per ml. Does not appear to be septic joint. Septic joint criteria of >50K WBC per ml is the common criteria for septic arthritis.  Synovial fluid cx negative thus far.  Placed on cubicin and rocephin yesterday(08-05-2023) per ID consult. Expected duration of treatment will be 6 weeks. Changed to IV rocephin and IV daptomycin yesterday. Discussed with pt's dtr lisa hill. She states that her brother-in-law is a Careers adviser in [place]. Pt's family is [place] would be willing to take patient into their home and provide care for patient for the next 6-8 weeks. Pt's grand-dtr that lives in [place] is also a PA.  Discussed with dtr lisa that she would be in charge of getting a homecare company name as our SW/CM do not know  of any homecare companies in [place]. Homecare company would need to be able to provide IV ABX, HHRN and HHPT for patient. We could order a wheelchair for patient. Dtr would drive patient down to [place] on Monday, August 10, 2023. Pt could have a temporary foley catheter for the drive to [place] and this could be removed by family when pt arrives to their home in [place]. Discussed with Dr. Drue Second who gives approval to place PICC line today.  08-07-2023 PICC line placed 08-06-2023.  home IV ABX with daptomycin 8mg /kg/day plus ceftriaxone 2gm IV daily. End date:Jan 2nd 2025. Plan for DC on Monday, August 10, 2023  08-08-2023 will place foley catheter tomorrow night. This can be removed by pt's son-in-law that is a Careers adviser in [place]. Pharmacy to dose IV ABX early on Monday AM so pt can be discharged.  Hyponatremia 08-05-2023 repeat BMP in AM.  08-06-2023 BMP pending today.  08-07-2023 resolved. Na  yesterday 139. Likely due to poor po intake.  08-08-2023 Na 139 today.  Chronic obstructive pulmonary disease (HCC) 08-05-2023 stable.  Hyperthyroidism 08-05-2023 continue methimazole  Essential hypertension 08-05-2023 BP meds on hold due to hypotension yesterday in ER(87/64)  08-06-2023 BP elevated this AM. Will restart ARB today.  08-07-2023 BP improved after starting ARB. Will check CMP in AM.  08-08-2023 BUN 16, scr 0.5. avapro 75 mg daily started yesterday. BP has improvedl       DVT prophylaxis: enoxaparin (LOVENOX) injection 40 mg Start: 08/05/23 0800 SCDs Start: 08/04/23 2210     Code Status: Full Code Family Communication: discussed with pt and dtr lisa at bedside Disposition Plan: home Reason for continuing need for hospitalization: remains on IV Abx.  Objective: Vitals:   08/07/23 2044 08/08/23 0554 08/08/23 0824 08/08/23 1241  BP: (!) 155/90 (!) 148/77  139/74  Pulse: 64 67  78  Resp: 16 16  17   Temp: 98.1 F (36.7 C) 97.9 F (36.6 C)  99.2 F (37.3 C)  TempSrc: Oral Oral  Oral  SpO2: 98% 97% 97% 97%  Weight:      Height:        Intake/Output Summary (Last 24 hours) at 08/08/2023 1449 Last data filed at 08/08/2023 1119 Gross per 24 hour  Intake 747.23 ml  Output 825 ml  Net -77.77 ml   Filed Weights   08/04/23 1426 08/05/23 1836  Weight: 56.5 kg 56.5 kg    Examination:  Physical Exam Vitals and nursing note reviewed.  HENT:     Head: Normocephalic and atraumatic.     Nose: Nose normal.  Cardiovascular:     Rate and Rhythm: Normal rate and regular rhythm.  Pulmonary:     Effort: Pulmonary effort is normal.  Abdominal:     General: Bowel sounds are normal.     Palpations: Abdomen is soft.  Musculoskeletal:     Comments: RUE PICC line.  Skin:    General: Skin is warm and dry.     Capillary Refill: Capillary refill takes less than 2 seconds.  Neurological:     Mental Status: She is alert and oriented to person, place, and time.      Data Reviewed: I have personally reviewed following labs and imaging studies  CBC: Recent Labs  Lab 08/04/23 1448 08/05/23 0032  WBC 10.6* 7.1  NEUTROABS 9.9*  --   HGB 12.9 12.3  HCT 39.3 38.4  MCV 97.8 100.8*  PLT 243 188   Basic Metabolic Panel: Recent Labs  Lab 08/04/23 1448 08/04/23  2250 08/05/23 0032 08/06/23 0557 08/08/23 0317  NA 138  --  132* 139 139  K 4.4  --  3.6 3.4* 4.1  CL 102  --  101 109 105  CO2 26  --  23 24 26   GLUCOSE 134*  --  101* 86 90  BUN 19  --  17 11 16   CREATININE 0.65 0.51 0.52 0.46  0.46 0.50  CALCIUM 9.4  --  8.3* 8.3* 8.6*   GFR: Estimated Creatinine Clearance: 40.7 mL/min (by C-G formula based on SCr of 0.5 mg/dL). Liver Function Tests: Recent Labs  Lab 08/04/23 1448 08/08/23 0317  AST 15 41  ALT 19 43  ALKPHOS 74 74  BILITOT 0.8 <0.2  PROT 6.4* 6.1*  ALBUMIN 3.5 2.9*   Coagulation Profile: Recent Labs  Lab 08/04/23 1448  INR 1.1   Cardiac Enzymes: Recent Labs  Lab 08/05/23 1750  CKTOTAL 19*   Sepsis Labs: Recent Labs  Lab 08/04/23 1448 08/04/23 1732 08/04/23 2009 08/04/23 2215  LATICACIDVEN 1.8 2.0* 1.6 1.2    Recent Results (from the past 240 hour(s))  Culture, blood (Routine x 2)     Status: None (Preliminary result)   Collection Time: 08/04/23  2:29 PM   Specimen: BLOOD RIGHT ARM  Result Value Ref Range Status   Specimen Description   Final    BLOOD RIGHT ARM Performed at [facility], [address], [place], [place] [address]    Special Requests   Final    Blood Culture adequate volume BOTTLES DRAWN AEROBIC AND ANAEROBIC Performed at [facility], [address], [place], [place] [address]    Culture   Final    NO GROWTH 4 DAYS Performed at [facility], [address], [place], [place] [address]    Report Status PENDING  Incomplete  Culture, blood (Routine x 2)     Status: None (Preliminary result)   Collection Time: 08/04/23  2:34 PM   Specimen:  BLOOD LEFT ARM  Result Value Ref Range Status   Specimen Description   Final    BLOOD LEFT ARM Performed at [facility], [address], [place], [place] [address]    Special Requests   Final    Blood Culture adequate volume BOTTLES DRAWN AEROBIC AND ANAEROBIC Performed at [facility], [address] [place], [place] [address]    Culture   Final    NO GROWTH 4 DAYS Performed at [facility], [address], [place], [place] [address]    Report Status PENDING  Incomplete  Resp panel by RT-PCR (RSV, Flu A&B, Covid) Anterior Nasal Swab     Status: None   Collection Time: 08/04/23  4:03 PM   Specimen: Anterior Nasal Swab  Result Value Ref Range Status   SARS Coronavirus 2 by RT PCR NEGATIVE NEGATIVE Final    Comment: (NOTE) SARS-CoV-2 target nucleic acids are NOT DETECTED.  The SARS-CoV-2 RNA is generally detectable in upper respiratory specimens during the acute phase of infection. The lowest concentration of SARS-CoV-2 viral copies this assay can detect is 138 copies/mL. A negative result does not preclude SARS-Cov-2 infection and should not be used as the sole basis for treatment or other patient management decisions. A negative result may occur with  improper specimen collection/handling, submission of specimen other than nasopharyngeal swab, presence of viral mutation(s) within the areas targeted by this assay, and inadequate number of viral copies(<138 copies/mL). A negative result must be  combined with clinical observations, patient history, and epidemiological information. The expected result is Negative.  Fact Sheet for Patients:  BloggerCourse.com  Fact Sheet for Healthcare Providers:  SeriousBroker.it  This test is no t yet approved or cleared by the [place] FDA and  has been authorized for detection and/or diagnosis of SARS-CoV-2 by FDA under an Emergency Use Authorization  (EUA). This EUA will remain  in effect (meaning this test can be used) for the duration of the COVID-19 declaration under Section 564(b)(1) of the Act, 21 U.S.C.section 360bbb-3(b)(1), unless the authorization is terminated  or revoked sooner.       Influenza A by PCR NEGATIVE NEGATIVE Final   Influenza B by PCR NEGATIVE NEGATIVE Final    Comment: (NOTE) The Xpert Xpress SARS-CoV-2/FLU/RSV plus assay is intended as an aid in the diagnosis of influenza from Nasopharyngeal swab specimens and should not be used as a sole basis for treatment. Nasal washings and aspirates are unacceptable for Xpert Xpress SARS-CoV-2/FLU/RSV testing.  Fact Sheet for Patients: BloggerCourse.com  Fact Sheet for Healthcare Providers: SeriousBroker.it  This test is not yet approved or cleared by the [place] FDA and has been authorized for detection and/or diagnosis of SARS-CoV-2 by FDA under an Emergency Use Authorization (EUA). This EUA will remain in effect (meaning this test can be used) for the duration of the COVID-19 declaration under Section 564(b)(1) of the Act, 21 U.S.C. section 360bbb-3(b)(1), unless the authorization is terminated or revoked.     Resp Syncytial Virus by PCR NEGATIVE NEGATIVE Final    Comment: (NOTE) Fact Sheet for Patients: BloggerCourse.com  Fact Sheet for Healthcare Providers: SeriousBroker.it  This test is not yet approved or cleared by the [place] FDA and has been authorized for detection and/or diagnosis of SARS-CoV-2 by FDA under an Emergency Use Authorization (EUA). This EUA will remain in effect (meaning this test can be used) for the duration of the COVID-19 declaration under Section 564(b)(1) of the Act, 21 U.S.C. section 360bbb-3(b)(1), unless the authorization is terminated or revoked.  Performed at [facility], [address] [place], [place] [address]   Body fluid culture w Gram Stain     Status: None (Preliminary result)   Collection Time: 08/05/23  2:18 PM   Specimen: Synovium; Body Fluid  Result Value Ref Range Status   Specimen Description SYNOVIAL  Final   Special Requests R HIP  Final   Gram Stain NO WBC SEEN NO ORGANISMS SEEN   Final   Culture   Final    NO GROWTH 3 DAYS Performed at [facility], [address], [place], [place] [address]    Report Status PENDING  Incomplete     Radiology Studies: No results found.  Scheduled Meds:  aspirin EC  81 mg Oral Daily   Chlorhexidine Gluconate Cloth  6 each Topical Daily   enoxaparin (LOVENOX) injection  40 mg Subcutaneous Q24H   ezetimibe  10 mg Oral Daily   And   simvastatin  20 mg Oral Daily   irbesartan  75 mg Oral Daily   methIMAzole  10 mg Oral Daily   mirtazapine  30 mg Oral QHS   mometasone-formoterol  2 puff Inhalation BID   montelukast  10 mg Oral QHS   sodium chloride flush  10-40 mL Intracatheter Q12H   sodium chloride flush  3 mL Intravenous Q12H   Continuous Infusions:  [START ON 08/09/2023] cefTRIAXone (ROCEPHIN)  IV     Followed by   [  START ON 08/10/2023] cefTRIAXone (ROCEPHIN)  IV     [START ON 08/09/2023] DAPTOmycin     Followed by   Melene Muller ON 08/10/2023] DAPTOmycin       LOS: 4 days   Time spent: 35 minutes  Carollee Herter, DO  [facility]  08/08/2023, 2:49 PM

## 2023-08-09 DIAGNOSIS — J449 Chronic obstructive pulmonary disease, unspecified: Secondary | ICD-10-CM | POA: Diagnosis not present

## 2023-08-09 DIAGNOSIS — I10 Essential (primary) hypertension: Secondary | ICD-10-CM | POA: Diagnosis not present

## 2023-08-09 DIAGNOSIS — M4646 Discitis, unspecified, lumbar region: Secondary | ICD-10-CM | POA: Diagnosis not present

## 2023-08-09 DIAGNOSIS — A419 Sepsis, unspecified organism: Secondary | ICD-10-CM | POA: Diagnosis not present

## 2023-08-09 LAB — CULTURE, BLOOD (ROUTINE X 2)
Culture: NO GROWTH
Culture: NO GROWTH
Special Requests: ADEQUATE
Special Requests: ADEQUATE

## 2023-08-09 LAB — BODY FLUID CULTURE W GRAM STAIN
Culture: NO GROWTH
Gram Stain: NONE SEEN

## 2023-08-09 NOTE — Progress Notes (Signed)
Occupational Therapy Treatment Patient Details Name: Ariel Lambert MRN: [id number] DOB: 10-Jan-1936 Today's Date: 08/09/2023   History of present illness 87 yo female admitted 08/04/23 with fever, AMS,,confusion. Patient has had ongoing right hip  infections. PMH: R hip hemiarthroplasty anterior approach on 02/06/23 , S/P Iand D  right hip,HTN, hyper thyroidism, COPD, STroke.  MRI -Some enhancement in the discs at L4-L5 and L5-S1, with  irregularity of the adjacent endplates at L5-S1 and small foci of  increased T2 signal and enhancement at the L4 endplate, which could  represent early discitis-osteomyelitis versus the sequela of  degenerative disc disease   OT comments  Patient was able to engage in grooming tasks standing at sink with no LOB. Patient did need A for opening small containers. Patient was encouraged reporting that she feels much better than earlier in the week. Plan remains for patient to d/c with daughter to daughters house in [place] with [facility] to follow. Patient's discharge plan remains appropriate at this time. OT will continue to follow acutely.        If plan is discharge home, recommend the following:  Assistance with cooking/housework;Direct supervision/assist for medications management;Direct supervision/assist for financial management;Assist for transportation;Help with stairs or ramp for entrance;A little help with walking and/or transfers;A little help with bathing/dressing/bathroom   Equipment Recommendations  None recommended by OT       Precautions / Restrictions Precautions Precautions: Fall Precaution Comments: ataxic Restrictions Weight Bearing Restrictions: No          Balance           Standing balance support: Single extremity supported, During functional activity Standing balance-Leahy Scale: Fair Standing balance comment: at sink for grooming                           ADL either performed or assessed with clinical judgement    ADL Overall ADL's : Needs assistance/impaired     Grooming: Standing;Wash/dry face;Wash/dry hands;Oral care;Supervision/safety;Set up Grooming Details (indicate cue type and reason): difficulty opening small containers.         General ADL Comments: needed cues to keep walker with her to transfer to and from sink.      Cognition Arousal: Alert Behavior During Therapy: WFL for tasks assessed/performed Overall Cognitive Status: Within Functional Limits for tasks assessed                                          Exercises Other Exercises Other Exercises: patient participated in 5 sit to stands with RW with proper hand placement with no cues needed supervision for safety.            Pertinent Vitals/ Pain       Pain Assessment Pain Assessment: No/denies pain         Frequency  Min 1X/week        Progress Toward Goals  OT Goals(current goals can now be found in the care plan section)  Progress towards OT goals: Progressing toward goals     Plan         AM-PAC OT "6 Clicks" Daily Activity     Outcome Measure   Help from another person eating meals?: None Help from another person taking care of personal grooming?: None Help from another person toileting, which includes using toliet, bedpan, or urinal?: A Lot Help from another person  bathing (including washing, rinsing, drying)?: A Lot Help from another person to put on and taking off regular upper body clothing?: A Little Help from another person to put on and taking off regular lower body clothing?: A Lot 6 Click Score: 17    End of Session Equipment Utilized During Treatment: Rolling walker (2 wheels);Gait belt  OT Visit Diagnosis: Unsteadiness on feet (R26.81);Ataxia, unspecified (R27.0);Muscle weakness (generalized) (M62.81);Other abnormalities of gait and mobility (R26.89)   Activity Tolerance Patient tolerated treatment well   Patient Left in chair;with call bell/phone within  reach;with nursing/sitter in room   Nurse Communication Mobility status        Time: 1610-9604 OT Time Calculation (min): 12 min  Charges: OT General Charges $OT Visit: 1 Visit OT Treatments $Self Care/Home Management : 8-22 mins  Rosalio Loud, MS Acute Rehabilitation Department Office# [phone]   Selinda Flavin 08/09/2023, 9:48 AM

## 2023-08-09 NOTE — Progress Notes (Signed)
PROGRESS NOTE    Ariel Lambert  ZOX:[id number] DOB: May 29, 1936 DOA: 08/04/2023 PCP: Mila Palmer, MD  Subjective: Pt seen and examined.  Right hip synovial fluid cx negative at 4 days Blood cx negative at final negative at 5 days  Plan to insert foley tonight. Will get Monday AM dose of IV abx early. And then DC from hospital.   Hospital Course: HPI:  Ariel Lambert is a 87 y.o. female with medical history significant of medical history as noted below.  Of particular importance is the fact that the patient had femoral fracture back in May that was repaired operatively.  Unfortunately patient postoperative course was complicated with a potential infection requiring a revision and patient has had poor healing of the skin since then.  Patient was last seen by orthopedic surgery yesterday when wound dehiscence site was sutured over.   History is obtained primarily from the daughter at the bedside but also from the patient herself to a good extent.  Patient was in her usual state of health till yesterday evening.  Patient is generally ambulatory with a walker, alert and awake and reasonably intelligently conversant with family members and friends.  She ago she occasionally goes out with friends for lunch etc.   However since this morning patient has been noted to be basically extremely sleepy/lethargic and laying recumbent in bed.  Patient was since discovered to have a temperature of 101 F at home.  Further patient has been complaining of low back pain.  There is no complaint from the patient of ear pain sore throat cough trouble breathing chest pain neck pain. patient has a mild headache but not as severe headache.  There is no report of increased pain from the right hip.  No new rash, although patient is generally flushed.  There is no report of diarrhea or vomiting.     [facility] at around noon time and cared for by the ER attending  Dr. Particia Nearing.  Patient was diagnosed with sepsis has received empiric antibiotics, workup as noted below.  Medical evaluation is sought.   Patient/daughter feel that the patient has generally improved since her initial presentation this morning.  She is more comfortable headache has improved and she is more interactive.  Significant Events: Admitted 08/04/2023 for sepsis with encephalopathy   Significant Labs: Admission WBC 10.6, lactic acid 2.0, BP 87/64(responded to IVF)  Significant Imaging Studies: Admission MRI lumbar spine shows Some enhancement in the discs at L4-L5 and L5-S1, with irregularity of the adjacent endplates at L5-S1 and small foci of increased T2 signal and enhancement at the L4 endplate, which could represent early discitis-osteomyelitis versus the sequela of degenerative disc disease. Correlate with ESR and CRP. 2. Minimal enhancement in some of the left cauda equina nerve roots at L4 and L5, with some mild nerve root thickening, suggestive of arachnoiditis. 3. L3-L4 mild-to-moderate spinal canal stenosis. 4. L1-L2 and L2-L3 mild spinal canal stenosis. 5. L1-L2 mild spinal canal stenosis. 6. L5-S1 mild left neural foraminal narrowing. 7. Narrowing of the lateral recesses at L1-L2, L2-L3, L3-L4, and L4-L5 could affect the descending L2, L3, L4, and L5 nerve roots, respectively.  Antibiotic Therapy: Anti-infectives (From admission, onward)    Start     Dose/Rate Route Frequency Ordered Stop   08/05/23 1745  vancomycin (VANCOREADY) IVPB 750 mg/150 mL        750 mg 150 mL/hr over 60 Minutes Intravenous Every 24 hours 08/04/23 2326  08/05/23 0000  piperacillin-tazobactam (ZOSYN) IVPB 3.375 g        3.375 g 12.5 mL/hr over 240 Minutes Intravenous Every 8 hours 08/04/23 2259     08/04/23 1515  metroNIDAZOLE (FLAGYL) IVPB 500 mg        500 mg 100 mL/hr over 60 Minutes Intravenous  Once 08/04/23 1505 08/04/23 1748   08/04/23 1515  vancomycin (VANCOCIN) IVPB 1000 mg/200 mL  premix        1,000 mg 200 mL/hr over 60 Minutes Intravenous  Once 08/04/23 1508 08/04/23 1847   08/04/23 1515  ceFEPIme (MAXIPIME) 2 g in sodium chloride 0.9 % 100 mL IVPB        2 g 200 mL/hr over 30 Minutes Intravenous  Once 08/04/23 1508 08/04/23 1733       Procedures:   Consultants: orthopedics    Assessment and Plan: * Sepsis with encephalopathy (HCC) 08-04-2023 Although no fever is recorded in the medical record, patient daughter reports a temperature of 101 F at home.  Patient has leukocytosis and patient's blood pressure was soft at initial presentation.  Patient received 1.5 L of fluid and blood pressure seems to have improved mentation seems to have improved.  Patient has gotten lactic acid of 2.0 initially improved.  At this time patient still appears to be dry and I will continue 100 cc an hour of fluid overnight with reevaluation in the morning.  With regards to the focus of infection.  Respiratory panel, limited as negative.  Principal concern is the fact that the patient is complaining of low back pain and obviously has had an open sore in the right hip since May.  I do not appreciate any cross-sectional imaging in the chart of the affected areas thus far.  I think it is time to do cross-sectional imaging of low back and right hip.  To best guide our further treatment.  In the interim I will continue patient on empiric vancomycin and zosyn for suspected osteomyelitis of the sites. Blood cultures pending.  Patient's mentation, per daughter has improved.  Patient did not demonstrate any marked confusion/hallucination or focal deficits to me.  No nuchal rigidity.  I think this is due to metabolic encephalopathy from sepsis we will continue with clinical monitoring in this regard.  08-05-2023 likely due to discitis. Was present on admission. WBC 10.6, lactic acid 2.0. MRI lumbar spine shows discitis. Now resolved.  08-09-2023 blood cx negative. Right hip synovial cultures are  4 days negative.  Lumbar discitis 08-05-2023 MRI lumbar spine shows discitis. Ortho consulted and order right hip aspiration. Remains on IV zosyn/vanco. Will enlist assistance of ID to guide abx management and duration.  08-06-2023 Right hip synovial fluid shows WBC 390 per ml. Does not appear to be septic joint. Septic joint criteria of >50K WBC per ml is the common criteria for septic arthritis.  Synovial fluid cx negative thus far.  Placed on cubicin and rocephin yesterday(08-05-2023) per ID consult. Expected duration of treatment will be 6 weeks. Changed to IV rocephin and IV daptomycin yesterday. Discussed with pt's dtr lisa hill. She states that her brother-in-law is a Careers adviser in [place]. Pt's family is [place] would be willing to take patient into their home and provide care for patient for the next 6-8 weeks. Pt's grand-dtr that lives in [place] is also a PA.  Discussed with dtr lisa that she would be in charge of getting a homecare company name as our SW/CM do not know of any homecare companies  in [place]. Homecare company would need to be able to provide IV ABX, HHRN and HHPT for patient. We could order a wheelchair for patient. Dtr would drive patient down to [place] on Monday, August 10, 2023. Pt could have a temporary foley catheter for the drive to [place] and this could be removed by family when pt arrives to their home in [place]. Discussed with Dr. Drue Second who gives approval to place PICC line today.  08-07-2023 PICC line placed 08-06-2023.  home IV ABX with daptomycin 8mg /kg/day plus ceftriaxone 2gm IV daily. End date:Jan 2nd 2025. Plan for DC on Monday, August 10, 2023  08-08-2023 will place foley catheter tomorrow night. This can be removed by pt's son-in-law that is a Careers adviser in [place]. Pharmacy to dose IV ABX early on Monday AM so pt can be discharged.  08-09-2023 will place foley tonight. Had messaged pharmacist to adjust IV ABX administration time so pt is ready to leave by  8 AM Monday morning.  Hyponatremia 08-05-2023 repeat BMP in AM.  08-06-2023 BMP pending today.  08-07-2023 resolved. Na yesterday 139. Likely due to poor po intake.  08-08-2023 Na 139 today.  Chronic obstructive pulmonary disease (HCC) 08-05-2023 stable.  Hyperthyroidism 08-05-2023 continue methimazole  Essential hypertension 08-05-2023 BP meds on hold due to hypotension yesterday in ER(87/64)  08-06-2023 BP elevated this AM. Will restart ARB today.  08-07-2023 BP improved after starting ARB. Will check CMP in AM.  08-08-2023 BUN 16, scr 0.5. avapro 75 mg daily started yesterday. BP has improved  08-09-2023 BP stable and improved on avapro 75 mg daily. Will hold HCTZ at discharge.   DVT prophylaxis: enoxaparin (LOVENOX) injection 40 mg Start: 08/05/23 0800 SCDs Start: 08/04/23 2210    Code Status: Full Code Family Communication: discussed with pt's dtr lisa by phone. Disposition Plan: home Reason for continuing need for hospitalization: remains on IV ABX. Plan for DC in AM.  Objective: Vitals:   08/08/23 1937 08/08/23 2004 08/09/23 0504 08/09/23 0945  BP:  (!) 141/85 136/86 111/71  Pulse:  62 65 78  Resp:  16 18   Temp:  97.6 F (36.4 C) 98.6 F (37 C)   TempSrc:  Oral Oral   SpO2: 95% 96% 96%   Weight:      Height:        Intake/Output Summary (Last 24 hours) at 08/09/2023 1148 Last data filed at 08/09/2023 0857 Gross per 24 hour  Intake 705.6 ml  Output 1000 ml  Net -294.4 ml   Filed Weights   08/04/23 1426 08/05/23 1836  Weight: 56.5 kg 56.5 kg    Examination:  Physical Exam Vitals and nursing note reviewed.  Constitutional:      General: She is not in acute distress.    Appearance: She is not toxic-appearing or diaphoretic.  HENT:     Head: Normocephalic and atraumatic.  Pulmonary:     Effort: Pulmonary effort is normal. No respiratory distress.  Abdominal:     General: Abdomen is flat.  Skin:    General: Skin is warm and dry.   Neurological:     General: No focal deficit present.     Mental Status: She is alert and oriented to person, place, and time.     Data Reviewed: I have personally reviewed following labs and imaging studies  CBC: Recent Labs  Lab 08/04/23 1448 08/05/23 0032  WBC 10.6* 7.1  NEUTROABS 9.9*  --   HGB 12.9 12.3  HCT 39.3 38.4  MCV 97.8 100.8*  PLT 243 188   Basic Metabolic Panel: Recent Labs  Lab 08/04/23 1448 08/04/23 2250 08/05/23 0032 08/06/23 0557 08/08/23 0317  NA 138  --  132* 139 139  K 4.4  --  3.6 3.4* 4.1  CL 102  --  101 109 105  CO2 26  --  23 24 26   GLUCOSE 134*  --  101* 86 90  BUN 19  --  17 11 16   CREATININE 0.65 0.51 0.52 0.46  0.46 0.50  CALCIUM 9.4  --  8.3* 8.3* 8.6*   GFR: Estimated Creatinine Clearance: 40.7 mL/min (by C-G formula based on SCr of 0.5 mg/dL). Liver Function Tests: Recent Labs  Lab 08/04/23 1448 08/08/23 0317  AST 15 41  ALT 19 43  ALKPHOS 74 74  BILITOT 0.8 <0.2  PROT 6.4* 6.1*  ALBUMIN 3.5 2.9*   Coagulation Profile: Recent Labs  Lab 08/04/23 1448  INR 1.1   Cardiac Enzymes: Recent Labs  Lab 08/05/23 1750  CKTOTAL 19*   Sepsis Labs: Recent Labs  Lab 08/04/23 1448 08/04/23 1732 08/04/23 2009 08/04/23 2215  LATICACIDVEN 1.8 2.0* 1.6 1.2    Recent Results (from the past 240 hour(s))  Culture, blood (Routine x 2)     Status: None   Collection Time: 08/04/23  2:29 PM   Specimen: BLOOD RIGHT ARM  Result Value Ref Range Status   Specimen Description   Final    BLOOD RIGHT ARM Performed at [facility], [address], [place], [place] [address]    Special Requests   Final    Blood Culture adequate volume BOTTLES DRAWN AEROBIC AND ANAEROBIC Performed at [facility], [address], [place], [place] [address]    Culture   Final    NO GROWTH 5 DAYS Performed at [facility], [address], [place], [place] [address]    Report Status 08/09/2023 FINAL  Final  Culture,  blood (Routine x 2)     Status: None   Collection Time: 08/04/23  2:34 PM   Specimen: BLOOD LEFT ARM  Result Value Ref Range Status   Specimen Description   Final    BLOOD LEFT ARM Performed at [facility], [address] [place], [place] [address]    Special Requests   Final    Blood Culture adequate volume BOTTLES DRAWN AEROBIC AND ANAEROBIC Performed at [facility], [address] [place], [place] [address]    Culture   Final    NO GROWTH 5 DAYS Performed at [facility], [address] [place], [place] [address]    Report Status 08/09/2023 FINAL  Final  Resp panel by RT-PCR (RSV, Flu A&B, Covid) Anterior Nasal Swab     Status: None   Collection Time: 08/04/23  4:03 PM   Specimen: Anterior Nasal Swab  Result Value Ref Range Status   SARS Coronavirus 2 by RT PCR NEGATIVE NEGATIVE Final    Comment: (NOTE) SARS-CoV-2 target nucleic acids are NOT DETECTED.  The SARS-CoV-2 RNA is generally detectable in upper respiratory specimens during the acute phase of infection. The lowest concentration of SARS-CoV-2 viral copies this assay can detect is 138 copies/mL. A negative result does not preclude SARS-Cov-2 infection and should not be used as the sole basis for treatment or other patient management decisions. A negative result may occur with  improper specimen collection/handling, submission of specimen other than nasopharyngeal swab, presence of viral mutation(s) within the areas targeted by this  assay, and inadequate number of viral copies(<138 copies/mL). A negative result must be combined with clinical observations, patient history, and epidemiological information. The expected result is Negative.  Fact Sheet for Patients:  BloggerCourse.com  Fact Sheet for Healthcare Providers:  SeriousBroker.it  This test is no t yet approved or cleared by the [place] FDA and  has been authorized  for detection and/or diagnosis of SARS-CoV-2 by FDA under an Emergency Use Authorization (EUA). This EUA will remain  in effect (meaning this test can be used) for the duration of the COVID-19 declaration under Section 564(b)(1) of the Act, 21 U.S.C.section 360bbb-3(b)(1), unless the authorization is terminated  or revoked sooner.       Influenza A by PCR NEGATIVE NEGATIVE Final   Influenza B by PCR NEGATIVE NEGATIVE Final    Comment: (NOTE) The Xpert Xpress SARS-CoV-2/FLU/RSV plus assay is intended as an aid in the diagnosis of influenza from Nasopharyngeal swab specimens and should not be used as a sole basis for treatment. Nasal washings and aspirates are unacceptable for Xpert Xpress SARS-CoV-2/FLU/RSV testing.  Fact Sheet for Patients: BloggerCourse.com  Fact Sheet for Healthcare Providers: SeriousBroker.it  This test is not yet approved or cleared by the [place] FDA and has been authorized for detection and/or diagnosis of SARS-CoV-2 by FDA under an Emergency Use Authorization (EUA). This EUA will remain in effect (meaning this test can be used) for the duration of the COVID-19 declaration under Section 564(b)(1) of the Act, 21 U.S.C. section 360bbb-3(b)(1), unless the authorization is terminated or revoked.     Resp Syncytial Virus by PCR NEGATIVE NEGATIVE Final    Comment: (NOTE) Fact Sheet for Patients: BloggerCourse.com  Fact Sheet for Healthcare Providers: SeriousBroker.it  This test is not yet approved or cleared by the [place] FDA and has been authorized for detection and/or diagnosis of SARS-CoV-2 by FDA under an Emergency Use Authorization (EUA). This EUA will remain in effect (meaning this test can be used) for the duration of the COVID-19 declaration under Section 564(b)(1) of the Act, 21 U.S.C. section 360bbb-3(b)(1), unless the authorization is  terminated or revoked.  Performed at [facility], [address] [place], [place] [address]   Body fluid culture w Gram Stain     Status: None   Collection Time: 08/05/23  2:18 PM   Specimen: Synovium; Body Fluid  Result Value Ref Range Status   Specimen Description SYNOVIAL  Final   Special Requests R HIP  Final   Gram Stain NO WBC SEEN NO ORGANISMS SEEN   Final   Culture   Final    NO GROWTH 3 DAYS Performed at [facility], [address], [place], [place] [address]    Report Status 08/09/2023 FINAL  Final     Radiology Studies: No results found.  Scheduled Meds:  aspirin EC  81 mg Oral Daily   Chlorhexidine Gluconate Cloth  6 each Topical Daily   enoxaparin (LOVENOX) injection  40 mg Subcutaneous Q24H   ezetimibe  10 mg Oral Daily   And   simvastatin  20 mg Oral Daily   irbesartan  75 mg Oral Daily   methIMAzole  10 mg Oral Daily   mirtazapine  30 mg Oral QHS   mometasone-formoterol  2 puff Inhalation BID   montelukast  10 mg Oral QHS   sodium chloride flush  10-40 mL Intracatheter Q12H   sodium chloride flush  3 mL Intravenous Q12H   Continuous Infusions:  [START ON 08/10/2023]  cefTRIAXone (ROCEPHIN)  IV     [START ON 08/10/2023] DAPTOmycin       LOS: 5 days   Time spent: 35 minutes  Carollee Herter, DO  [facility]  08/09/2023, 11:48 AM

## 2023-08-09 NOTE — Progress Notes (Signed)
Mobility Specialist - Progress Note   08/09/23 1352  Mobility  Activity Ambulated with assistance in hallway  Level of Assistance Contact guard assist, steadying assist  Assistive Device Front wheel walker  Distance Ambulated (ft) 175 ft  Range of Motion/Exercises Active  Activity Response Tolerated well  Mobility Referral Yes  $Mobility charge 1 Mobility  Mobility Specialist Start Time (ACUTE ONLY) 1337  Mobility Specialist Stop Time (ACUTE ONLY) 1348  Mobility Specialist Time Calculation (min) (ACUTE ONLY) 11 min   Received in chair and agreed to mobility. Had no issues throughout session. Returned to chair with all needs met.  Marilynne Halsted Mobility Specialist

## 2023-08-09 NOTE — Progress Notes (Signed)
OT Cancellation Note  Patient Details Name: Ariel Lambert MRN: [id number] DOB: 1936-05-10   Cancelled Treatment:    Reason Eval/Treat Not Completed: Other (comment) Patient is up in recliner just having received breakfast tray. OT to continue to follow and check back as schedule will allow.  Rosalio Loud, MS Acute Rehabilitation Department Office# [phone]  08/09/2023, 8:26 AM

## 2023-08-09 NOTE — Progress Notes (Addendum)
Mobility Specialist - Progress Note   08/09/23 0926  Mobility  Activity Ambulated with assistance in hallway  Level of Assistance Contact guard assist, steadying assist  Assistive Device Front wheel walker  Distance Ambulated (ft) 175 ft  Range of Motion/Exercises Active  Activity Response Tolerated well  Mobility Referral Yes  $Mobility charge 1 Mobility  Mobility Specialist Start Time (ACUTE ONLY) 0909  Mobility Specialist Stop Time (ACUTE ONLY) [id number]  Mobility Specialist Time Calculation (min) (ACUTE ONLY) 14 min   Received in chair and agreed to mobility. Had no issues throughout session. Returned to chair with all needs met. Alarm on.  Marilynne Halsted Mobility Specialist

## 2023-08-10 DIAGNOSIS — I10 Essential (primary) hypertension: Secondary | ICD-10-CM | POA: Diagnosis not present

## 2023-08-10 DIAGNOSIS — A419 Sepsis, unspecified organism: Secondary | ICD-10-CM | POA: Diagnosis not present

## 2023-08-10 DIAGNOSIS — J449 Chronic obstructive pulmonary disease, unspecified: Secondary | ICD-10-CM | POA: Diagnosis not present

## 2023-08-10 DIAGNOSIS — M4646 Discitis, unspecified, lumbar region: Secondary | ICD-10-CM | POA: Diagnosis not present

## 2023-08-10 LAB — COMPREHENSIVE METABOLIC PANEL
ALT: 119 U/L — ABNORMAL HIGH (ref 0–44)
AST: 78 U/L — ABNORMAL HIGH (ref 15–41)
Albumin: 2.8 g/dL — ABNORMAL LOW (ref 3.5–5.0)
Alkaline Phosphatase: 70 U/L (ref 38–126)
Anion gap: 8 (ref 5–15)
BUN: 15 mg/dL (ref 8–23)
CO2: 25 mmol/L (ref 22–32)
Calcium: 8.7 mg/dL — ABNORMAL LOW (ref 8.9–10.3)
Chloride: 108 mmol/L (ref 98–111)
Creatinine, Ser: 0.49 mg/dL (ref 0.44–1.00)
GFR, Estimated: >60 mL/min (ref >60)
Glucose, Bld: 91 mg/dL (ref 70–99)
Potassium: 3.7 mmol/L (ref 3.5–5.1)
Sodium: 141 mmol/L (ref 135–145)
Total Bilirubin: 0.6 mg/dL (ref <1.2)
Total Protein: 5.9 g/dL — ABNORMAL LOW (ref 6.5–8.1)

## 2023-08-10 LAB — CBC WITH DIFFERENTIAL/PLATELET
Abs Immature Granulocytes: 0.05 10*3/uL (ref 0.00–0.07)
Basophils Absolute: 0 10*3/uL (ref 0.0–0.1)
Basophils Relative: 1 %
Eosinophils Absolute: 0 10*3/uL (ref 0.0–0.5)
Eosinophils Relative: 0 %
HCT: 33.5 % — ABNORMAL LOW (ref 36.0–46.0)
Hemoglobin: 10.9 g/dL — ABNORMAL LOW (ref 12.0–15.0)
Immature Granulocytes: 1 %
Lymphocytes Relative: 23 %
Lymphs Abs: 1.1 10*3/uL (ref 0.7–4.0)
MCH: 32.9 pg (ref 26.0–34.0)
MCHC: 32.5 g/dL (ref 30.0–36.0)
MCV: 101.2 fL — ABNORMAL HIGH (ref 80.0–100.0)
Monocytes Absolute: 0.5 10*3/uL (ref 0.1–1.0)
Monocytes Relative: 11 %
Neutro Abs: 3.1 10*3/uL (ref 1.7–7.7)
Neutrophils Relative %: 64 %
Platelets: 179 10*3/uL (ref 150–400)
RBC: 3.31 MIL/uL — ABNORMAL LOW (ref 3.87–5.11)
RDW: 15.8 % — ABNORMAL HIGH (ref 11.5–15.5)
WBC: 4.9 10*3/uL (ref 4.0–10.5)
nRBC: 0 % (ref 0.0–0.2)

## 2023-08-10 LAB — MAGNESIUM: Magnesium: 2.1 mg/dL (ref 1.7–2.4)

## 2023-08-10 MED ORDER — MONTELUKAST SODIUM 10 MG PO TABS: 10.0000 mg | ORAL_TABLET | Freq: Every day | ORAL | 0 refills | Status: AC

## 2023-08-10 MED ORDER — IRBESARTAN 75 MG PO TABS: 75.0000 mg | ORAL_TABLET | Freq: Every day | ORAL | 0 refills | Status: AC

## 2023-08-10 MED ORDER — LEVALBUTEROL HCL 0.63 MG/3ML IN NEBU: 0.6300 mg | INHALATION_SOLUTION | Freq: Four times a day (QID) | RESPIRATORY_TRACT | 0 refills | Status: AC | PRN

## 2023-08-10 MED ORDER — DAPTOMYCIN IV (FOR PTA / DISCHARGE USE ONLY): 500.0000 mg | INTRAVENOUS | 0 refills | Status: AC

## 2023-08-10 MED ORDER — SENNA 8.6 MG PO TABS: 1.0000 | ORAL_TABLET | Freq: Two times a day (BID) | ORAL | 0 refills | Status: AC

## 2023-08-10 MED ORDER — CEFTRIAXONE IV (FOR PTA / DISCHARGE USE ONLY): 2.0000 g | INTRAVENOUS | 0 refills | Status: AC

## 2023-08-10 MED ORDER — ORAL CARE MOUTH RINSE: 15.0000 mL | OROMUCOSAL | Status: DC | PRN

## 2023-08-10 MED ORDER — EZETIMIBE-SIMVASTATIN 10-20 MG PO TABS: 1.0000 | ORAL_TABLET | Freq: Every day | ORAL | 2 refills | Status: AC

## 2023-08-10 MED ORDER — SYMBICORT 160-4.5 MCG/ACT IN AERO: 2.0000 | INHALATION_SPRAY | Freq: Two times a day (BID) | RESPIRATORY_TRACT | 0 refills | Status: AC

## 2023-08-10 MED ORDER — HEPARIN SOD (PORK) LOCK FLUSH 100 UNIT/ML IV SOLN
250.0000 [IU] | INTRAVENOUS | Status: AC | PRN
  Administered 2023-08-10: 250 [IU]

## 2023-08-10 MED ORDER — MIRTAZAPINE 30 MG PO TBDP: 30.0000 mg | ORAL_TABLET | Freq: Every day | ORAL | 2 refills | Status: AC

## 2023-08-10 MED ORDER — ONDANSETRON HCL 4 MG PO TABS: 4.0000 mg | ORAL_TABLET | Freq: Four times a day (QID) | ORAL | 0 refills | Status: AC | PRN

## 2023-08-10 MED ORDER — METHIMAZOLE 10 MG PO TABS: 5.0000 mg | ORAL_TABLET | ORAL | 0 refills | Status: DC

## 2023-08-10 MED ORDER — ALBUTEROL SULFATE HFA 108 (90 BASE) MCG/ACT IN AERS: 2.0000 | INHALATION_SPRAY | Freq: Four times a day (QID) | RESPIRATORY_TRACT | 0 refills | Status: AC | PRN

## 2023-08-10 NOTE — Care Management Important Message (Signed)
Important Message  Patient Details IM Letter given. Name: Ariel Lambert MRN: [id number] Date of Birth: 1936-04-28   Important Message Given:  Yes - Medicare IM     Caren Macadam 08/10/2023, 9:21 AM

## 2023-08-10 NOTE — TOC Transition Note (Signed)
Transition of Care [facility]) - CM/SW Discharge Note   Patient Details  Name: Ariel Lambert MRN: [id number] Date of Birth: [birth year]  Transition of [facility]) CM/SW Contact:  Otelia Santee, LCSW Phone Number: 08/10/2023, 10:35 AM   Clinical Narrative:    Pt t odischarge to stay with family in [place]. Pt will be receiving IV abx and equipment from [facility]. [facility] to provide HHPT/RN/Aide. Wheelchair has been ordered through [employer] and will be delivered to pt's room prior to discharge.    Final next level of care: Home w Home Health Services Barriers to Discharge: Barriers Resolved   Patient Goals and CMS Choice CMS Medicare.gov Compare Post Acute Care list provided to:: Patient Choice offered to / list presented to : Patient  Discharge Placement                         Discharge Plan and Services Additional resources added to the After Visit Summary for   In-house Referral: Clinical Social Work Discharge Planning Services: NA Post Acute Care Choice: Home Health, Durable Medical Equipment          DME Arranged: Wheelchair manual DME Agency: [facility] Date DME Agency Contacted: 08/10/23 Time DME Agency Contacted: [phone] Representative spoke with at DME Agency: Zack            Social Determinants of Health (SDOH) Interventions SDOH Screenings   Food Insecurity: No Food Insecurity (08/04/2023)  Housing: Patient Declined (08/04/2023)  Transportation Needs: No Transportation Needs (08/04/2023)  Utilities: Not At Risk (08/04/2023)  Tobacco Use: Medium Risk (08/04/2023)     Readmission Risk Interventions    08/06/2023    2:04 PM  Readmission Risk Prevention Plan  Transportation Screening Complete  PCP or Specialist Appt within 5-7 Days Complete  Home Care Screening Complete  Medication Review (RN CM) Complete

## 2023-08-10 NOTE — Discharge Summary (Addendum)
[facility] Physician Discharge Summary   Patient name: Ariel Lambert  Admit date:     08/04/2023  Discharge date: 08/10/2023  Attending Physician: Darlin Drop [[id number]]  Discharge Physician: Carollee Herter   PCP: Mila Palmer, MD  Admitted From: Home  Disposition:  Home  Recommendations for Outpatient Follow-up:  Follow up with PCP in 1-2 weeks Follow up with Infectious Disease on 09-21-2023  Home Health:Yes. Home PT, RN, aide Equipment/Devices: PICC LINE   Date Placed: 08-06-2023  Discharge Condition:Stable CODE STATUS:FULL Diet recommendation: Regular Fluid Restriction: None  Hospital Summary: HPI:  Ariel Lambert is a 87 y.o. female with medical history significant of medical history as noted below.  Of particular importance is the fact that the patient had femoral fracture back in May that was repaired operatively.  Unfortunately patient postoperative course was complicated with a potential infection requiring a revision and patient has had poor healing of the skin since then.  Patient was last seen by orthopedic surgery yesterday when wound dehiscence site was sutured over.   History is obtained primarily from the daughter at the bedside but also from the patient herself to a good extent.  Patient was in her usual state of health till yesterday evening.  Patient is generally ambulatory with a walker, alert and awake and reasonably intelligently conversant with family members and friends.  She ago she occasionally goes out with friends for lunch etc.   However since this morning patient has been noted to be basically extremely sleepy/lethargic and laying recumbent in bed.  Patient was since discovered to have a temperature of 101 F at home.  Further patient has been complaining of low back pain.  There is no complaint from the patient of ear pain sore throat cough trouble breathing chest pain neck pain. patient has a mild headache but not as severe  headache.  There is no report of increased pain from the right hip.  No new rash, although patient is generally flushed.  There is no report of diarrhea or vomiting.     [facility] around noon time and cared for by the ER attending Dr. Particia Nearing.  Patient was diagnosed with sepsis has received empiric antibiotics, workup as noted below.  Medical evaluation is sought.   Patient/daughter feel that the patient has generally improved since her initial presentation this morning.  She is more comfortable headache has improved and she is more interactive.  Significant Events: Admitted 08/04/2023 for sepsis with encephalopathy   Significant Labs: Admission WBC 10.6, lactic acid 2.0, BP 87/64(responded to IVF)  Significant Imaging Studies: Admission MRI lumbar spine shows Some enhancement in the discs at L4-L5 and L5-S1, with irregularity of the adjacent endplates at L5-S1 and small foci of increased T2 signal and enhancement at the L4 endplate, which could represent early discitis-osteomyelitis versus the sequela of degenerative disc disease. Correlate with ESR and CRP. 2. Minimal enhancement in some of the left cauda equina nerve roots at L4 and L5, with some mild nerve root thickening, suggestive of arachnoiditis. 3. L3-L4 mild-to-moderate spinal canal stenosis. 4. L1-L2 and L2-L3 mild spinal canal stenosis. 5. L1-L2 mild spinal canal stenosis. 6. L5-S1 mild left neural foraminal narrowing. 7. Narrowing of the lateral recesses at L1-L2, L2-L3, L3-L4, and L4-L5 could affect the descending L2, L3, L4, and L5 nerve roots, respectively.  Antibiotic Therapy: Anti-infectives (From admission, onward)    Start     Dose/Rate Route Frequency Ordered Stop  08/05/23 1745  vancomycin (VANCOREADY) IVPB 750 mg/150 mL        750 mg 150 mL/hr over 60 Minutes Intravenous Every 24 hours 08/04/23 2326     08/05/23 0000  piperacillin-tazobactam (ZOSYN) IVPB 3.375 g        3.375  g 12.5 mL/hr over 240 Minutes Intravenous Every 8 hours 08/04/23 2259     08/04/23 1515  metroNIDAZOLE (FLAGYL) IVPB 500 mg        500 mg 100 mL/hr over 60 Minutes Intravenous  Once 08/04/23 1505 08/04/23 1748   08/04/23 1515  vancomycin (VANCOCIN) IVPB 1000 mg/200 mL premix        1,000 mg 200 mL/hr over 60 Minutes Intravenous  Once 08/04/23 1508 08/04/23 1847   08/04/23 1515  ceFEPIme (MAXIPIME) 2 g in sodium chloride 0.9 % 100 mL IVPB        2 g 200 mL/hr over 30 Minutes Intravenous  Once 08/04/23 1508 08/04/23 1733       Procedures:   Consultants: orthopedics   Hospital Course by Problem: * Sepsis with encephalopathy (HCC) 08-04-2023 Although no fever is recorded in the medical record, patient daughter reports a temperature of 101 F at home.  Patient has leukocytosis and patient's blood pressure was soft at initial presentation.  Patient received 1.5 L of fluid and blood pressure seems to have improved mentation seems to have improved.  Patient has gotten lactic acid of 2.0 initially improved.  At this time patient still appears to be dry and I will continue 100 cc an hour of fluid overnight with reevaluation in the morning.  With regards to the focus of infection.  Respiratory panel, limited as negative.  Principal concern is the fact that the patient is complaining of low back pain and obviously has had an open sore in the right hip since May.  I do not appreciate any cross-sectional imaging in the chart of the affected areas thus far.  I think it is time to do cross-sectional imaging of low back and right hip.  To best guide our further treatment.  In the interim I will continue patient on empiric vancomycin and zosyn for suspected osteomyelitis of the sites. Blood cultures pending.  Patient's mentation, per daughter has improved.  Patient did not demonstrate any marked confusion/hallucination or focal deficits to me.  No nuchal rigidity.  I think this is due to metabolic  encephalopathy from sepsis we will continue with clinical monitoring in this regard.  08-05-2023 likely due to discitis. Was present on admission. WBC 10.6, lactic acid 2.0. MRI lumbar spine shows discitis. Now resolved.  08-09-2023 blood cx negative. Right hip synovial cultures are 4 days negative.  08-10-2023 synovial cx negative(final). Blood cx negative(finale). Complete IV ABX see below  Lumbar discitis 08-05-2023 MRI lumbar spine shows discitis. Ortho consulted and order right hip aspiration. Remains on IV zosyn/vanco. Will enlist assistance of ID to guide abx management and duration.  08-06-2023 Right hip synovial fluid shows WBC 390 per ml. Does not appear to be septic joint. Septic joint criteria of >50K WBC per ml is the common criteria for septic arthritis.  Synovial fluid cx negative thus far.  Placed on cubicin and rocephin yesterday(08-05-2023) per ID consult. Expected duration of treatment will be 6 weeks. Changed to IV rocephin and IV daptomycin yesterday. Discussed with pt's dtr lisa hill. She states that her brother-in-law is a Careers adviser in [place]. Pt's family is [place] would be willing to take patient into their home and  provide care for patient for the next 6-8 weeks. Pt's grand-dtr that lives in [place] is also a PA.  Discussed with dtr lisa that she would be in charge of getting a homecare company name as our SW/CM do not know of any homecare companies in [place]. Homecare company would need to be able to provide IV ABX, HHRN and HHPT for patient. We could order a wheelchair for patient. Dtr would drive patient down to [place] on Monday, August 10, 2023. Pt could have a temporary foley catheter for the drive to [place] and this could be removed by family when pt arrives to their home in [place]. Discussed with Dr. Drue Second who gives approval to place PICC line today.  08-07-2023 PICC line placed 08-06-2023.  home IV ABX with daptomycin 8mg /kg/day plus ceftriaxone 2gm IV daily.  End date:Jan 2nd 2025. Plan for DC on Monday, August 10, 2023  08-08-2023 will place foley catheter tomorrow night. This can be removed by pt's son-in-law that is a Careers adviser in [place]. Pharmacy to dose IV ABX early on Monday AM so pt can be discharged.  08-09-2023 will place foley tonight. Had messaged pharmacist to adjust IV ABX administration time so pt is ready to leave by 8 AM Monday morning.  08-10-2023 ready for DC. Will DC with foley and PICC line. Foley to be removed today after pt arrives in [place].  End Date abx Sep 17, 2023.  Remains on Daptomyin IV 500 every day (8 mg/kg/day) and IV rocephin 2 gram every day.  Hyponatremia 08-05-2023 repeat BMP in AM.  08-06-2023 BMP pending today.  08-07-2023 resolved. Na yesterday 139. Likely due to poor po intake.  08-08-2023 Na 139 today.  08-10-2023 DC Na 141  Chronic obstructive pulmonary disease (HCC) 08-05-2023 stable.  08-10-2023 remains on singulair 10 mg at bedtime, dulera (200-5) 2 puffs bid, prn xopenex and prn albuterol  Hyperthyroidism 08-05-2023 continue methimazole  08-10-2023 continue with methimazole at discharge. Alternating doses of 5 mg every other day and 10 mg every other day   Essential hypertension 08-05-2023 BP meds on hold due to hypotension yesterday in ER(87/64)  08-06-2023 BP elevated this AM. Will restart ARB today.  08-07-2023 BP improved after starting ARB. Will check CMP in AM.  08-08-2023 BUN 16, scr 0.5. avapro 75 mg daily started yesterday. BP has improved  08-09-2023 BP stable and improved on avapro 75 mg daily. Will hold HCTZ at discharge.  08-10-2023 BP stable. Will DC on Avapro 75 mg daily. Stop hydrochlorothiazide at discharge    Discharge Diagnoses:  Principal Problem:   Sepsis with encephalopathy [facility]) Active Problems:   Lumbar discitis   Essential hypertension   Hyperthyroidism   Chronic obstructive pulmonary disease (HCC)   Hyponatremia   Discharge  Instructions  Discharge Instructions     [facility] pharmacist to adjust dose for Vancomycin, Aminoglycosides and other anti-infective therapies as requested by physician.   Complete by: As directed    [facility] to provide Cath Flo 2mg    Complete by: As directed    Administer for PICC line occlusion and as ordered by physician for other access device issues.   Anaphylaxis Kit: Provided to treat any anaphylactic reaction to the medication being provided to the patient if First Dose or when requested by physician   Complete by: As directed    Epinephrine 1mg /ml vial / amp: Administer 0.3mg  (0.45ml) subcutaneously once for moderate to severe anaphylaxis, nurse to call physician and pharmacy when reaction occurs and call 911  if needed for immediate care   Diphenhydramine 50mg /ml IV vial: Administer 25-50mg  IV/IM PRN for first dose reaction, rash, itching, mild reaction, nurse to call physician and pharmacy when reaction occurs   Sodium Chloride 0.9% NS IV: Administer if needed for hypovolemic blood pressure drop or as ordered by physician after call to physician with anaphylactic reaction   Call MD for:  hives   Complete by: As directed    Call MD for:  persistant dizziness or light-headedness   Complete by: As directed    Call MD for:  persistant nausea and vomiting   Complete by: As directed    Call MD for:  redness, tenderness, or signs of infection (pain, swelling, redness, odor or green/yellow discharge around incision site)   Complete by: As directed    Call MD for:  severe uncontrolled pain   Complete by: As directed    Call MD for:  temperature >100.4   Complete by: As directed    Change dressing on IV access line weekly and PRN   Complete by: As directed    Diet - low sodium heart healthy   Complete by: As directed    Discharge instructions   Complete by: As directed    1. Remove foley catheter as soon as you arrive in [place] today. Your son-in-law  should be able to remove catheter.  May sure that foley balloon is deflated before removal of catheter. Please drink plenty of water after foley removal.  If you do not urinate within 6 hours of removing foley catheter, please seek medical attention at the closest Emergency department.  2. Follow up with Infectious Disease on September 21, 2023 as scheduled.   Flush IV access with Sodium Chloride 0.9% and Heparin 10 units/ml or 100 units/ml   Complete by: As directed    Home infusion instructions - [facility]   Complete by: As directed    Instructions: Flush IV access with Sodium Chloride 0.9% and Heparin 10units/ml or 100units/ml   Change dressing on IV access line: Weekly and PRN   Instructions Cath Flo 2mg : Administer for PICC Line occlusion and as ordered by physician for other access device   [facility] pharmacist to adjust dose for: Vancomycin, Aminoglycosides and other anti-infective therapies as requested by physician   Increase activity slowly   Complete by: As directed    Method of administration may be changed at the discretion of home infusion pharmacist based upon assessment of the patient and/or caregiver's ability to self-administer the medication ordered   Complete by: As directed    No wound care   Complete by: As directed       Allergies as of 08/10/2023       Reactions   Cetirizine Other (See Comments)   Stomach pain   Codeine Itching   Severe itching   Hydrocodone Itching   Meperidine Hcl Diarrhea, Nausea And Vomiting   (Demerol)   Nabumetone Rash   (Relafen)   Naproxen Itching, Rash   Sulfa Antibiotics Rash   Sulfonamide Derivatives Rash        Medication List     STOP taking these medications    sulfamethoxazole-trimethoprim 400-80 MG tablet Commonly known as: BACTRIM   traZODone 100 MG tablet Commonly known as: DESYREL   valsartan-hydrochlorothiazide 80-12.5 MG tablet Commonly known as: DIOVAN-HCT       TAKE these  medications    acetaminophen 325 MG tablet Commonly known as: TYLENOL Take 2 tablets (650 mg  total) by mouth every 6 (six) hours as needed for fever, headache or mild pain.   albuterol 108 (90 Base) MCG/ACT inhaler Commonly known as: VENTOLIN HFA Inhale 2 puffs into the lungs every 6 (six) hours as needed for wheezing or shortness of breath (Asthma).   Allergy Relief 10 MG tablet Generic drug: loratadine Take 10 mg by mouth daily.   aspirin EC 81 MG tablet Take 81 mg by mouth daily. Swallow whole.   azelastine 0.1 % nasal spray Commonly known as: ASTELIN Place 2 sprays into both nostrils every evening. Use in each nostril as directed   Biotin 5000 MCG Caps Take 5,000 mcg by mouth daily.   Calcium 600+D3 600-20 MG-MCG Tabs Generic drug: Calcium Carb-Cholecalciferol Take 1 tablet by mouth 2 (two) times daily.   cefTRIAXone IVPB Commonly known as: ROCEPHIN Inject 2 g into the vein daily. Indication:  Discitis/osteo First Dose: Yes Last Day of Therapy:  09/17/23 Labs - Once weekly:  CBC/D and BMP, Labs - Once weekly: ESR and CRP Method of administration: IV Push Pull PICC line after abx course completed Method of administration may be changed at the discretion of home infusion pharmacist based upon assessment of the patient and/or caregiver's ability to self-administer the medication ordered.   daptomycin IVPB Commonly known as: CUBICIN Inject 500 mg into the vein daily. Indication:  Discitis/osteo First Dose: Yes Last Day of Therapy:  09/17/23 Labs - Once weekly:  CBC/D, BMP, and CPK Labs - Once weekly: ESR and CRP Method of administration: IV Push Pull PICC line after completion of IV antibiotics Method of administration may be changed at the discretion of home infusion pharmacist based upon assessment of the patient and/or caregiver's ability to self-administer the medication ordered.   diphenhydrAMINE 25 MG tablet Commonly known as: BENADRYL Take 25 mg by mouth daily  as needed for allergies.   ezetimibe-simvastatin 10-20 MG tablet Commonly known as: VYTORIN Take 1 tablet by mouth daily after breakfast.   fluticasone 50 MCG/ACT nasal spray Commonly known as: FLONASE Place 2 sprays into both nostrils daily as needed for allergies or rhinitis.   hydrocortisone cream 1 % Apply 1 Application topically daily as needed (Ezema).   hydroxypropyl methylcellulose / hypromellose 2.5 % ophthalmic solution Commonly known as: ISOPTO TEARS / GONIOVISC Place 1 drop into both eyes daily.   irbesartan 75 MG tablet Commonly known as: AVAPRO Take 1 tablet (75 mg total) by mouth daily.   levalbuterol 0.63 MG/3ML nebulizer solution Commonly known as: XOPENEX Take 3 mLs (0.63 mg total) by nebulization every 6 (six) hours as needed for up to 120 doses for wheezing or shortness of breath.   MAGNESIUM GLUCONATE PO Take 400 mg by mouth daily.   methimazole 10 MG tablet Commonly known as: TAPAZOLE Take 0.5-1 tablets (5-10 mg total) by mouth See admin instructions. Alternate 5 mg one day and 10 mg the next day   mirtazapine 30 MG disintegrating tablet Commonly known as: REMERON SOL-TAB Take 1 tablet (30 mg total) by mouth at bedtime.   montelukast 10 MG tablet Commonly known as: SINGULAIR Take 1 tablet (10 mg total) by mouth at bedtime.   multivitamin with minerals Tabs tablet Take 1 tablet by mouth daily.   ondansetron 4 MG tablet Commonly known as: ZOFRAN Take 1 tablet (4 mg total) by mouth every 6 (six) hours as needed for nausea.   senna 8.6 MG Tabs tablet Commonly known as: SENOKOT Take 1 tablet (8.6 mg total) by mouth 2 (two)  times daily.   Symbicort 160-4.5 MCG/ACT inhaler Generic drug: budesonide-formoterol Inhale 2 puffs into the lungs 2 (two) times daily.   triamcinolone ointment 0.1 % Commonly known as: KENALOG Apply 1 Application topically daily as needed (unknown).               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  A walker will not resolve issue with performing activities of daily living. A wheelchair will allow patient to safely perform daily activities. Patient can safely propel the wheelchair in the home or has a caregiver who can provide assistance. Length of need Lifetime. Accessories: elevating leg rests (ELRs), wheel locks, extensions and anti-tippers.   08/07/23 1316              Discharge Care Instructions  (From admission, onward)           Start     Ordered   08/07/23 0000  Change dressing on IV access line weekly and PRN  (Home infusion instructions - [facility] )        08/07/23 1407            Allergies  Allergen Reactions   Cetirizine Other (See Comments)    Stomach pain   Codeine Itching    Severe itching   Hydrocodone Itching   Meperidine Hcl Diarrhea and Nausea And Vomiting    (Demerol)   Nabumetone Rash    (Relafen)   Naproxen Itching and Rash   Sulfa Antibiotics Rash   Sulfonamide Derivatives Rash    Discharge Exam: Vitals:   08/10/23 0455 08/10/23 0520  BP: (!) 161/100 (!) 159/95  Pulse: 66 65  Resp: 18   Temp: 98.4 F (36.9 C)   SpO2: 95%     Physical Exam Vitals and nursing note reviewed.  HENT:     Head: Normocephalic and atraumatic.     Nose: Nose normal.  Eyes:     General: No scleral icterus. Cardiovascular:     Rate and Rhythm: Normal rate and regular rhythm.  Pulmonary:     Effort: Pulmonary effort is normal.     Breath sounds: Normal breath sounds.  Abdominal:     General: Bowel sounds are normal.  Genitourinary:    Comments: +foley catheter Musculoskeletal:     Comments: RUE picc line  Skin:    General: Skin is warm  and dry.     Capillary Refill: Capillary refill takes less than 2 seconds.  Neurological:     Mental Status: She is alert and oriented to person, place, and time.     The results of significant diagnostics from this hospitalization (including imaging, microbiology, ancillary and laboratory) are listed below for reference.    Microbiology: Recent Results (from the past 240 hour(s))  Culture, blood (Routine x 2)     Status: None   Collection Time: 08/04/23  2:29 PM   Specimen: BLOOD RIGHT ARM  Result Value Ref Range Status   Specimen Description   Final    BLOOD RIGHT ARM Performed at [facility], [address] [place], [place] [address]    Special Requests  Final    Blood Culture adequate volume BOTTLES DRAWN AEROBIC AND ANAEROBIC Performed at [facility], [address], [place], [place] [address]    Culture   Final    NO GROWTH 5 DAYS Performed at [facility], [address], [place], [place] [address]    Report Status 08/09/2023 FINAL  Final  Culture, blood (Routine x 2)     Status: None   Collection Time: 08/04/23  2:34 PM   Specimen: BLOOD LEFT ARM  Result Value Ref Range Status   Specimen Description   Final    BLOOD LEFT ARM Performed at [facility], [address], [place], [place] [address]    Special Requests   Final    Blood Culture adequate volume BOTTLES DRAWN AEROBIC AND ANAEROBIC Performed at [facility], [address] [place], [place] [address]    Culture   Final    NO GROWTH 5 DAYS Performed at [facility], [address], [place], [place] [address]    Report Status 08/09/2023 FINAL  Final  Resp panel by RT-PCR (RSV, Flu A&B, Covid) Anterior Nasal Swab     Status: None   Collection Time: 08/04/23  4:03 PM   Specimen: Anterior Nasal Swab  Result Value Ref Range Status   SARS Coronavirus 2 by RT PCR NEGATIVE NEGATIVE Final    Comment: (NOTE) SARS-CoV-2 target nucleic acids are  NOT DETECTED.  The SARS-CoV-2 RNA is generally detectable in upper respiratory specimens during the acute phase of infection. The lowest concentration of SARS-CoV-2 viral copies this assay can detect is 138 copies/mL. A negative result does not preclude SARS-Cov-2 infection and should not be used as the sole basis for treatment or other patient management decisions. A negative result may occur with  improper specimen collection/handling, submission of specimen other than nasopharyngeal swab, presence of viral mutation(s) within the areas targeted by this assay, and inadequate number of viral copies(<138 copies/mL). A negative result must be combined with clinical observations, patient history, and epidemiological information. The expected result is Negative.  Fact Sheet for Patients:  BloggerCourse.com  Fact Sheet for Healthcare Providers:  SeriousBroker.it  This test is no t yet approved or cleared by the [place] FDA and  has been authorized for detection and/or diagnosis of SARS-CoV-2 by FDA under an Emergency Use Authorization (EUA). This EUA will remain  in effect (meaning this test can be used) for the duration of the COVID-19 declaration under Section 564(b)(1) of the Act, 21 U.S.C.section 360bbb-3(b)(1), unless the authorization is terminated  or revoked sooner.       Influenza A by PCR NEGATIVE NEGATIVE Final   Influenza B by PCR NEGATIVE NEGATIVE Final    Comment: (NOTE) The Xpert Xpress SARS-CoV-2/FLU/RSV plus assay is intended as an aid in the diagnosis of influenza from Nasopharyngeal swab specimens and should not be used as a sole basis for treatment. Nasal washings and aspirates are unacceptable for Xpert Xpress SARS-CoV-2/FLU/RSV testing.  Fact Sheet for Patients: BloggerCourse.com  Fact Sheet for Healthcare Providers: SeriousBroker.it  This test is not  yet approved or cleared by the [place] FDA and has been authorized for detection and/or diagnosis of SARS-CoV-2 by FDA under an Emergency Use Authorization (EUA). This EUA will remain in effect (meaning this test can be used) for the duration of the COVID-19 declaration under Section 564(b)(1) of the Act, 21 U.S.C. section 360bbb-3(b)(1), unless the authorization is terminated or revoked.  Resp Syncytial Virus by PCR NEGATIVE NEGATIVE Final    Comment: (NOTE) Fact Sheet for Patients: BloggerCourse.com  Fact Sheet for Healthcare Providers: SeriousBroker.it  This test is not yet approved or cleared by the [place] FDA and has been authorized for detection and/or diagnosis of SARS-CoV-2 by FDA under an Emergency Use Authorization (EUA). This EUA will remain in effect (meaning this test can be used) for the duration of the COVID-19 declaration under Section 564(b)(1) of the Act, 21 U.S.C. section 360bbb-3(b)(1), unless the authorization is terminated or revoked.  Performed at [facility], [address] [place], [place] [address]   Body fluid culture w Gram Stain     Status: None   Collection Time: 08/05/23  2:18 PM   Specimen: Synovium; Body Fluid  Result Value Ref Range Status   Specimen Description SYNOVIAL  Final   Special Requests R HIP  Final   Gram Stain NO WBC SEEN NO ORGANISMS SEEN   Final   Culture   Final    NO GROWTH 3 DAYS Performed at [facility], [address] [place], [place] [address]    Report Status 08/09/2023 FINAL  Final     Labs:  Basic Metabolic Panel: Recent Labs  Lab 08/04/23 1448 08/04/23 2250 08/05/23 0032 08/06/23 0557 08/08/23 0317 08/10/23 0336  NA 138  --  132* 139 139 141  K 4.4  --  3.6 3.4* 4.1 3.7  CL 102  --  101 109 105 108  CO2 26  --  23 24 26 25   GLUCOSE 134*  --  101* 86 90 91  BUN 19  --  17 11 16 15   CREATININE 0.65 0.51 0.52  0.46  0.46 0.50 0.49  CALCIUM 9.4  --  8.3* 8.3* 8.6* 8.7*  MG  --   --   --   --   --  2.1   Liver Function Tests: Recent Labs  Lab 08/04/23 1448 08/08/23 0317 08/10/23 0336  AST 15 41 78*  ALT 19 43 119*  ALKPHOS 74 74 70  BILITOT 0.8 <0.2 0.6  PROT 6.4* 6.1* 5.9*  ALBUMIN 3.5 2.9* 2.8*   CBC: Recent Labs  Lab 08/04/23 1448 08/05/23 0032 08/10/23 0336  WBC 10.6* 7.1 4.9  NEUTROABS 9.9*  --  3.1  HGB 12.9 12.3 10.9*  HCT 39.3 38.4 33.5*  MCV 97.8 100.8* 101.2*  PLT 243 188 179   Cardiac Enzymes: Recent Labs  Lab 08/05/23 1750  CKTOTAL 19*   Lab Results  Component Value Date/Time   CRP 10.2 (H) 08/05/2023 08:25 AM   ESRSEDRATE 17 08/05/2023 08:25 AM    Lab Results  Component Value Date/Time   TSH 0.255 (L) 08/05/2023 12:32 AM   TSH 1.93 07/15/2023 10:15 AM    Urinalysis    Component Value Date/Time   COLORURINE YELLOW 08/04/2023 1732   APPEARANCEUR CLEAR 08/04/2023 1732   LABSPEC 1.031 (H) 08/04/2023 1732   PHURINE 7.0 08/04/2023 1732   GLUCOSEU NEGATIVE 08/04/2023 1732   HGBUR NEGATIVE 08/04/2023 1732   BILIRUBINUR NEGATIVE 08/04/2023 1732   KETONESUR NEGATIVE 08/04/2023 1732   PROTEINUR 30 (A) 08/04/2023 1732   UROBILINOGEN 0.2 03/22/2008 0735   NITRITE NEGATIVE 08/04/2023 1732   LEUKOCYTESUR NEGATIVE 08/04/2023 1732   Sepsis Labs Recent Labs  Lab 08/04/23 1448 08/05/23 0032 08/10/23 0336  WBC 10.6* 7.1 4.9   Synovial cell count + diff, w/ crystals Order: [id number] Status: Final result     Visible to  patient: Yes (not seen)     Next appt: 09/21/2023 at 10:15 AM in Infectious Diseases Judyann Munson, MD)   0 Result Notes    Component Ref Range & Units 5 d ago  Color, Synovial YELLOW YELLOW  Appearance-Synovial CLEAR HAZY Abnormal   WBC, Synovial 0 - 200 /cu mm 390 High   Neutrophil, Synovial 0 - 25 % 51 High   Lymphocytes-Synovial Fld 0 - 20 % 24 High   Monocyte-Macrophage-Synovial Fluid 50 - 90 % 33 Low    Eosinophils-Synovial 0 - 1 % 2 High       Microbiology Recent Results (from the past 240 hour(s))  Culture, blood (Routine x 2)     Status: None   Collection Time: 08/04/23  2:29 PM   Specimen: BLOOD RIGHT ARM  Result Value Ref Range Status   Specimen Description   Final    BLOOD RIGHT ARM Performed at [facility], [address] [place], [place] [address]    Special Requests   Final    Blood Culture adequate volume BOTTLES DRAWN AEROBIC AND ANAEROBIC Performed at [facility], [address] [place], [place] [address]    Culture   Final    NO GROWTH 5 DAYS Performed at [facility], [address] [place], [place] [address]    Report Status 08/09/2023 FINAL  Final  Culture, blood (Routine x 2)     Status: None   Collection Time: 08/04/23  2:34 PM   Specimen: BLOOD LEFT ARM  Result Value Ref Range Status   Specimen Description   Final    BLOOD LEFT ARM Performed at [facility], [address], [place], [place] [address]    Special Requests   Final    Blood Culture adequate volume BOTTLES DRAWN AEROBIC AND ANAEROBIC Performed at [facility], [address], [place], [place] [address]    Culture   Final    NO GROWTH 5 DAYS Performed at [facility], [address] [place], [place] [address]    Report Status 08/09/2023 FINAL  Final  Resp panel by RT-PCR (RSV, Flu A&B, Covid) Anterior Nasal Swab     Status: None   Collection Time: 08/04/23  4:03 PM   Specimen: Anterior Nasal Swab  Result Value Ref Range Status   SARS Coronavirus 2 by RT PCR NEGATIVE NEGATIVE Final    Comment: (NOTE) SARS-CoV-2 target nucleic acids are NOT DETECTED.  The SARS-CoV-2 RNA is generally detectable in upper respiratory specimens during the acute phase of infection. The lowest concentration of SARS-CoV-2 viral copies this assay can detect is 138 copies/mL. A negative result does not preclude SARS-Cov-2 infection and  should not be used as the sole basis for treatment or other patient management decisions. A negative result may occur with  improper specimen collection/handling, submission of specimen other than nasopharyngeal swab, presence of viral mutation(s) within the areas targeted by this assay, and inadequate number of viral copies(<138 copies/mL). A negative result must be combined with clinical observations, patient history, and epidemiological information. The expected result is Negative.  Fact Sheet for Patients:  BloggerCourse.com  Fact Sheet for Healthcare Providers:  SeriousBroker.it  This test is no t yet approved or cleared by the [place] FDA and  has been authorized for detection and/or diagnosis of SARS-CoV-2 by FDA under an Emergency Use Authorization (EUA). This EUA will remain  in effect (meaning this test can be used) for the duration of  the COVID-19 declaration under Section 564(b)(1) of the Act, 21 U.S.C.section 360bbb-3(b)(1), unless the authorization is terminated  or revoked sooner.       Influenza A by PCR NEGATIVE NEGATIVE Final   Influenza B by PCR NEGATIVE NEGATIVE Final    Comment: (NOTE) The Xpert Xpress SARS-CoV-2/FLU/RSV plus assay is intended as an aid in the diagnosis of influenza from Nasopharyngeal swab specimens and should not be used as a sole basis for treatment. Nasal washings and aspirates are unacceptable for Xpert Xpress SARS-CoV-2/FLU/RSV testing.  Fact Sheet for Patients: BloggerCourse.com  Fact Sheet for Healthcare Providers: SeriousBroker.it  This test is not yet approved or cleared by the [place] FDA and has been authorized for detection and/or diagnosis of SARS-CoV-2 by FDA under an Emergency Use Authorization (EUA). This EUA will remain in effect (meaning this test can be used) for the duration of the COVID-19 declaration  under Section 564(b)(1) of the Act, 21 U.S.C. section 360bbb-3(b)(1), unless the authorization is terminated or revoked.     Resp Syncytial Virus by PCR NEGATIVE NEGATIVE Final    Comment: (NOTE) Fact Sheet for Patients: BloggerCourse.com  Fact Sheet for Healthcare Providers: SeriousBroker.it  This test is not yet approved or cleared by the [place] FDA and has been authorized for detection and/or diagnosis of SARS-CoV-2 by FDA under an Emergency Use Authorization (EUA). This EUA will remain in effect (meaning this test can be used) for the duration of the COVID-19 declaration under Section 564(b)(1) of the Act, 21 U.S.C. section 360bbb-3(b)(1), unless the authorization is terminated or revoked.  Performed at [facility], [address] [place], [place] [address]   Body fluid culture w Gram Stain     Status: None   Collection Time: 08/05/23  2:18 PM   Specimen: Synovium; Body Fluid  Result Value Ref Range Status   Specimen Description SYNOVIAL  Final   Special Requests R HIP  Final   Gram Stain NO WBC SEEN NO ORGANISMS SEEN   Final   Culture   Final    NO GROWTH 3 DAYS Performed at [facility] [place] [facility], [address], [place], [place] [address]    Report Status 08/09/2023 FINAL  Final    Procedures/Studies: [place] EKG SITE RITE  Result Date: 08/06/2023 If Site Rite image not attached, placement could not be confirmed due to current cardiac rhythm.  MR HIP RIGHT W WO CONTRAST  Result Date: 08/05/2023 CLINICAL DATA:  Sepsis and osteomyelitis. EXAM: MRI OF THE RIGHT HIP WITHOUT AND WITH CONTRAST TECHNIQUE: Multiplanar, multisequence MR imaging was performed both before and after administration of intravenous contrast. CONTRAST:  5mL GADAVIST GADOBUTROL 1 MMOL/ML IV SOLN COMPARISON:  Radiographs 08/04/2023 FINDINGS: BONES AND JOINTS: Prominent field heterogeneity related to the right hip implant.  Unfortunately this reduces diagnostic sensitivity and specificity, with poor and heterogeneous fat saturation all around the implant. This substantially lower sensitivity in detecting marrow edema in the adjacent bony structures No obvious dropout of T1 signal in the bony pelvis near the implant to imply active osteomyelitis. I do not see a substantial bony erosion or obvious periprosthetic fluid collection along the visualized part of the prosthesis. Moderate degenerative spurring and loss of articular space in the contralateral (left) hip joint. MUSCULOTENDINOUS: Sensitivity for muscle edema is adversely affected by field heterogeneity due to the hip implant. STIR images were not obtained. Regional bilateral muscular atrophy mildly greater on the right than the left. OTHER: Distended urinary bladder. No significant regional adenopathy is observed.  IMPRESSION: 1. Prominent field heterogeneity related to the right hip implant, reducing diagnostic sensitivity and specificity. No obvious dropout of T1 signal in the bony pelvis near the implant to imply active osteomyelitis. I do not see a substantial bony erosion or obvious periprosthetic fluid collection along the visualized part of the prosthesis. 2. Moderate degenerative spurring and loss of articular space in the contralateral (left) hip joint. 3. Regional bilateral muscular atrophy mildly greater on the right than the left. 4. Distended urinary bladder. Electronically Signed   By: Gaylyn Rong M.D.   On: 08/05/2023 16:32   IR US DRAIN/INJ MAJOR JOINT/BURSA  Result Date: 08/05/2023 INDICATION: 87 year old female with possible right hip joint infection. She presents to IR for aspiration under imaging guidance. EXAM: IR DRAIN/INJ MAJOR JOINT/BURSA MEDICATIONS: The patient is currently admitted to the hospital and receiving intravenous antibiotics. The antibiotics were administered within an appropriate time frame prior to the initiation of the procedure.  ANESTHESIA/SEDATION: None. COMPLICATIONS: None immediate. PROCEDURE: Informed written consent was obtained from the patient after a thorough discussion of the procedural risks, benefits and alternatives. All questions were addressed. Maximal Sterile Barrier Technique was utilized including caps, mask, sterile gowns, sterile gloves, sterile drape, hand hygiene and skin antiseptic. A timeout was performed prior to the initiation of the procedure. Right hip was interrogated with ultrasound. No large fluid collection visualized. Therefore, fluoroscopic imaging was used to localize the neck of the hip arthroplasty prosthesis. The skin was marked and then sterilely prepped and draped in the standard fashion using chlorhexidine skin prep. Local anesthesia was attained by infiltration with 1% lidocaine. Under fluoroscopic guidance, a 20 gauge 3-1/2 inch needle was advanced into the joint space. Aspiration yields approximately 5 mL of golden synovial fluid. This was sent to the lab for further evaluation. The needle was removed and a Band-Aid applied. IMPRESSION: Successful aspiration of 5 mL golden synovial fluid which was sent for Gram stain, culture, cell count with differential and crystal analysis. Electronically Signed   By: Malachy Moan M.D.   On: 08/05/2023 15:59   MR Lumbar Spine W Wo Contrast  Result Date: 08/05/2023 CLINICAL DATA:  Patient septic, concern for osteomyelitis; weakness EXAM: MRI LUMBAR SPINE WITHOUT AND WITH CONTRAST TECHNIQUE: Multiplanar and multiecho pulse sequences of the lumbar spine were obtained without and with intravenous contrast. CONTRAST:  5mL GADAVIST GADOBUTROL 1 MMOL/ML IV SOLN COMPARISON:  None Available. FINDINGS: Segmentation:  5 lumbar type vertebral bodies. Alignment: Levocurvature. 2 mm anterolisthesis of L2 on L3. 5 mm anterolisthesis of L3 on L4. 4 mm retrolisthesis of L4 on L5. Vertebrae: No evidence of acute fracture or suspicious osseous lesion. Some enhancement is  noted in the disc spaces of L4-L5 and L5-S1 (series 11, image 5). The adjacent endplates at L5-S1 appear irregular, with increased T2 signal and minimal enhancement (series 11, images 6-9). Degenerative changes are noted at the L4 endplate, with small foci of increased T2 signal and enhancement (series 11, image 9 and series 7, image 9 Endplate degenerative changes, with increased T2 signal and some contrast enhancement at T11-T12, eccentric the left, L1-L2, eccentric to the right, and L3-L4, eccentric to the left, compatible with edema and inflammation, most likely Modic type 1 endplate changes, without abnormal signal disc spaces. Additional Modic type 3 endplate changes, with low T1 and T2 signal, are noted at L2-L3, eccentric to the right. Conus medullaris and cauda equina: Conus extends to the L1 level. Minimal enhancement in some of the left cauda equina nerve roots  at L4 (series 12, image 23 and L5 (series 12, image 30) with some mild nerve root thickening (series 9, image 23). No epidural collection. Paraspinal and other soft tissues: Atrophy of the posterior inferior paraspinous muscles, right psoas, and right iliacus. No lymphadenopathy. Disc levels: T11-T12: Seen only on the sagittal images. Mild disc bulge with left subarticular protrusion. No spinal canal stenosis or neural foraminal narrowing. T12-L1: Mild disc bulge with right foraminal and extreme lateral protrusion. Mild facet arthropathy. Mild facet arthropathy. No spinal canal stenosis or neural foraminal narrowing. L1-L2: Right eccentric disc bulge. Mild facet arthropathy. Narrowing of the right lateral recess. Mild spinal canal stenosis. No neural foraminal narrowing. L2-L3: Mild disc bulge. Mild facet arthropathy. Narrowing of the right-greater-than-left lateral recess. Mild spinal canal stenosis. No neural foraminal narrowing. L3-L4: Anterolisthesis with disc unroofing and moderate disc bulge. Moderate facet arthropathy. Ligamentum flavum  hypertrophy. Narrowing of the left-greater-than-right lateral recess. Mild to moderate spinal canal stenosis. No neural foraminal narrowing. L4-L5: Retrolisthesis and mild disc bulge. Mild facet arthropathy. Narrowing of the lateral recesses. No spinal canal stenosis or neural foraminal narrowing. L5-S1: Mild disc bulge, which may contact the exiting L5 nerve roots, with superimposed central protrusion. Mild-to-moderate facet arthropathy. No spinal canal stenosis. Mild left neural foraminal narrowing. IMPRESSION: 1. Some enhancement in the discs at L4-L5 and L5-S1, with irregularity of the adjacent endplates at L5-S1 and small foci of increased T2 signal and enhancement at the L4 endplate, which could represent early discitis-osteomyelitis versus the sequela of degenerative disc disease. Correlate with ESR and CRP. 2. Minimal enhancement in some of the left cauda equina nerve roots at L4 and L5, with some mild nerve root thickening, suggestive of arachnoiditis. 3. L3-L4 mild-to-moderate spinal canal stenosis. 4. L1-L2 and L2-L3 mild spinal canal stenosis. 5. L1-L2 mild spinal canal stenosis. 6. L5-S1 mild left neural foraminal narrowing. 7. Narrowing of the lateral recesses at L1-L2, L2-L3, L3-L4, and L4-L5 could affect the descending L2, L3, L4, and L5 nerve roots, respectively. Electronically Signed   By: Wiliam Ke M.D.   On: 08/05/2023 01:29   DG Hip Unilat W or Wo Pelvis 2-3 Views Right  Result Date: 08/04/2023 CLINICAL DATA:  Right hip pain EXAM: DG HIP (WITH OR WITHOUT PELVIS) 2-3V RIGHT COMPARISON:  02/09/2023 CT scan FINDINGS: Right hip prosthesis noted with single cerclage wire along the proximal femur. No visible periprosthetic fracture. No acute bony findings. Moderate degenerative left hip arthropathy. Lower lumbar spondylosis and degenerative disc disease. Vascular calcifications. Bony demineralization. IMPRESSION: 1. Right hip prosthesis without visible periprosthetic fracture or acute  complicating feature. 2. Moderate degenerative left hip arthropathy. 3. Lower lumbar spondylosis and degenerative disc disease. 4. Bony demineralization. Electronically Signed   By: Gaylyn Rong M.D.   On: 08/04/2023 20:15   DG Chest Port 1 View  Result Date: 08/04/2023 CLINICAL DATA:  Shortness of breath weakness, fatigue.  Fever. EXAM: PORTABLE CHEST 1 VIEW COMPARISON:  02/07/2023 FINDINGS: Atherosclerotic calcification of the aortic arch. Dextroconvex thoracic scoliosis. The lungs appear clear. No blunting of the costophrenic angles. Heart size within normal limits. Degenerative spurring of both humeral heads. Clips project over the right lower neck. IMPRESSION: 1. No acute findings. 2. Dextroconvex thoracic scoliosis. 3. Aortic Atherosclerosis (ICD10-I70.0). Electronically Signed   By: Gaylyn Rong M.D.   On: 08/04/2023 20:11   US THYROID  Result Date: 07/31/2023 CLINICAL DATA:  Goiter. EXAM: THYROID ULTRASOUND TECHNIQUE: Ultrasound examination of the thyroid gland and adjacent soft tissues was performed. COMPARISON:  Remote  prior thyroid ultrasound 07/01/2019 FINDINGS: Parenchymal Echotexture: Markedly heterogenous Isthmus: 0.8 cm Right lobe: 5.1 x 2.1 x 2.4 cm Left lobe: 5.0 x 2.5 x 2.0 cm _________________________________________________________ Estimated total number of nodules >/= 1 cm: 6-10 Number of spongiform nodules >/=  2 cm not described below (TR1): 0 Number of mixed cystic and solid nodules >/= 1.5 cm not described below (TR2): 0 _________________________________________________________ Diffusely enlarged and heterogeneous thyroid gland most consistent with multinodular goiter. Mild hypervascularity on color Doppler imaging. Nodule # 1: No interval change in the size or appearance of the solid isoechoic nodule in the thyroid isthmus measuring up to 1.3 cm. TI-RADS category 3. Given size (<1.4 cm) and appearance, this nodule does NOT meet TI-RADS criteria for biopsy or dedicated  follow-up. Nodule # 8: Continued stability of isoechoic solid nodule in the left lower gland at 1.9 x 1.8 x 1.7 cm compared to 1.9 x 1.8 x 1.8 cm previously. This exam confirms 4 years of stability. Lesion remains consistent with TI-RADS category 3. *Given size (>/= 1.5 - 2.4 cm) and appearance, a follow-up ultrasound in 1 year should be considered based on TI-RADS criteria. Numerous additional small solid isoechoic nodules, cysts in other benign-appearing lesions are scattered throughout the entirety of the gland. IMPRESSION: 1. Very similar appearance of diffusely enlarged, heterogeneous, multinodular and hypervascular thyroid gland consistent with diffuse goitrous change versus chronic thyroiditis. 2. Confirmed 4 year stability of 1.9 cm TI-RADS category 3 nodule in the left lower gland. This lesion meets criteria for 1 additional follow-up ultrasound in October of 2025 to confirm 5 years of stability and thus benignity. 3. Additional small scattered thyroid cysts and nodules are also noted but do not warrant further consideration. The above is in keeping with the ACR TI-RADS recommendations - J Am Coll Radiol 2017;14:587-595. Electronically Signed   By: Malachy Moan M.D.   On: 07/31/2023 07:23   MM 3D SCREENING MAMMOGRAM BILATERAL BREAST  Result Date: 07/31/2023 CLINICAL DATA:  Screening. EXAM: DIGITAL SCREENING BILATERAL MAMMOGRAM WITH TOMOSYNTHESIS AND CAD TECHNIQUE: Bilateral screening digital craniocaudal and mediolateral oblique mammograms were obtained. Bilateral screening digital breast tomosynthesis was performed. The images were evaluated with computer-aided detection. COMPARISON:  Previous exam(s). ACR Breast Density Category b: There are scattered areas of fibroglandular density. FINDINGS: There are no findings suspicious for malignancy. IMPRESSION: No mammographic evidence of malignancy. A result letter of this screening mammogram will be mailed directly to the patient. RECOMMENDATION:  Screening mammogram in one year. (Code:[id number]-B-01Y) BI-RADS CATEGORY  1: Negative. Electronically Signed   By: Frederico Hamman M.D.   On: 07/31/2023 07:20    EGK: NSR    Time coordinating discharge: 45 mins  SIGNED:  Carollee Herter, DO [facility] 08/10/23, 9:21 AM

## 2023-08-10 NOTE — Progress Notes (Signed)
PROGRESS NOTE    Ariel Lambert  VPX:[id number] DOB: 07-20-1936 DOA: 08/04/2023 PCP: Mila Palmer, MD  Subjective: Pt seen and examined.  Ready for DC today. [facility] [place]. [address] [place], [place] [address]     Hospital Course: HPI:  Ariel Lambert is a 87 y.o. female with medical history significant of medical history as noted below.  Of particular importance is the fact that the patient had femoral fracture back in May that was repaired operatively.  Unfortunately patient postoperative course was complicated with a potential infection requiring a revision and patient has had poor healing of the skin since then.  Patient was last seen by orthopedic surgery yesterday when wound dehiscence site was sutured over.   History is obtained primarily from the daughter at the bedside but also from the patient herself to a good extent.  Patient was in her usual state of health till yesterday evening.  Patient is generally ambulatory with a walker, alert and awake and reasonably intelligently conversant with family members and friends.  She ago she occasionally goes out with friends for lunch etc.   However since this morning patient has been noted to be basically extremely sleepy/lethargic and laying recumbent in bed.  Patient was since discovered to have a temperature of 101 F at home.  Further patient has been complaining of low back pain.  There is no complaint from the patient of ear pain sore throat cough trouble breathing chest pain neck pain. patient has a mild headache but not as severe headache.  There is no report of increased pain from the right hip.  No new rash, although patient is generally flushed.  There is no report of diarrhea or vomiting.     [facility] around noon time and cared for by the ER attending Dr. Particia Nearing.  Patient was diagnosed with sepsis has received empiric antibiotics, workup as noted  below.  Medical evaluation is sought.   Patient/daughter feel that the patient has generally improved since her initial presentation this morning.  She is more comfortable headache has improved and she is more interactive.  Significant Events: Admitted 08/04/2023 for sepsis with encephalopathy   Significant Labs: Admission WBC 10.6, lactic acid 2.0, BP 87/64(responded to IVF)  Significant Imaging Studies: Admission MRI lumbar spine shows Some enhancement in the discs at L4-L5 and L5-S1, with irregularity of the adjacent endplates at L5-S1 and small foci of increased T2 signal and enhancement at the L4 endplate, which could represent early discitis-osteomyelitis versus the sequela of degenerative disc disease. Correlate with ESR and CRP. 2. Minimal enhancement in some of the left cauda equina nerve roots at L4 and L5, with some mild nerve root thickening, suggestive of arachnoiditis. 3. L3-L4 mild-to-moderate spinal canal stenosis. 4. L1-L2 and L2-L3 mild spinal canal stenosis. 5. L1-L2 mild spinal canal stenosis. 6. L5-S1 mild left neural foraminal narrowing. 7. Narrowing of the lateral recesses at L1-L2, L2-L3, L3-L4, and L4-L5 could affect the descending L2, L3, L4, and L5 nerve roots, respectively.  Antibiotic Therapy: Anti-infectives (From admission, onward)    Start     Dose/Rate Route Frequency Ordered Stop   08/05/23 1745  vancomycin (VANCOREADY) IVPB 750 mg/150 mL        750 mg 150 mL/hr over 60 Minutes Intravenous Every 24 hours 08/04/23 2326     08/05/23 0000  piperacillin-tazobactam (ZOSYN) IVPB 3.375 g        3.375  g 12.5 mL/hr over 240 Minutes Intravenous Every 8 hours 08/04/23 2259     08/04/23 1515  metroNIDAZOLE (FLAGYL) IVPB 500 mg        500 mg 100 mL/hr over 60 Minutes Intravenous  Once 08/04/23 1505 08/04/23 1748   08/04/23 1515  vancomycin (VANCOCIN) IVPB 1000 mg/200 mL premix        1,000 mg 200 mL/hr over 60 Minutes Intravenous  Once 08/04/23 1508 08/04/23 1847    08/04/23 1515  ceFEPIme (MAXIPIME) 2 g in sodium chloride 0.9 % 100 mL IVPB        2 g 200 mL/hr over 30 Minutes Intravenous  Once 08/04/23 1508 08/04/23 1733       Procedures:   Consultants: orthopedics    Assessment and Plan: * Sepsis with encephalopathy (HCC) 08-04-2023 Although no fever is recorded in the medical record, patient daughter reports a temperature of 101 F at home.  Patient has leukocytosis and patient's blood pressure was soft at initial presentation.  Patient received 1.5 L of fluid and blood pressure seems to have improved mentation seems to have improved.  Patient has gotten lactic acid of 2.0 initially improved.  At this time patient still appears to be dry and I will continue 100 cc an hour of fluid overnight with reevaluation in the morning.  With regards to the focus of infection.  Respiratory panel, limited as negative.  Principal concern is the fact that the patient is complaining of low back pain and obviously has had an open sore in the right hip since May.  I do not appreciate any cross-sectional imaging in the chart of the affected areas thus far.  I think it is time to do cross-sectional imaging of low back and right hip.  To best guide our further treatment.  In the interim I will continue patient on empiric vancomycin and zosyn for suspected osteomyelitis of the sites. Blood cultures pending.  Patient's mentation, per daughter has improved.  Patient did not demonstrate any marked confusion/hallucination or focal deficits to me.  No nuchal rigidity.  I think this is due to metabolic encephalopathy from sepsis we will continue with clinical monitoring in this regard.  08-05-2023 likely due to discitis. Was present on admission. WBC 10.6, lactic acid 2.0. MRI lumbar spine shows discitis. Now resolved.  08-09-2023 blood cx negative. Right hip synovial cultures are 4 days negative.  08-10-2023 synovial cx negative(final). Blood cx negative(finale). Complete IV  ABX see below  Lumbar discitis 08-05-2023 MRI lumbar spine shows discitis. Ortho consulted and order right hip aspiration. Remains on IV zosyn/vanco. Will enlist assistance of ID to guide abx management and duration.  08-06-2023 Right hip synovial fluid shows WBC 390 per ml. Does not appear to be septic joint. Septic joint criteria of >50K WBC per ml is the common criteria for septic arthritis.  Synovial fluid cx negative thus far.  Placed on cubicin and rocephin yesterday(08-05-2023) per ID consult. Expected duration of treatment will be 6 weeks. Changed to IV rocephin and IV daptomycin yesterday. Discussed with pt's dtr lisa hill. She states that her brother-in-law is a Careers adviser in [place]. Pt's family is [place] would be willing to take patient into their home and provide care for patient for the next 6-8 weeks. Pt's grand-dtr that lives in [place] is also a PA.  Discussed with dtr lisa that she would be in charge of getting a homecare company name as our SW/CM do not know of any homecare companies in [place]. Homecare  company would need to be able to provide IV ABX, HHRN and HHPT for patient. We could order a wheelchair for patient. Dtr would drive patient down to [place] on Monday, August 10, 2023. Pt could have a temporary foley catheter for the drive to [place] and this could be removed by family when pt arrives to their home in [place]. Discussed with Dr. Drue Second who gives approval to place PICC line today.  08-07-2023 PICC line placed 08-06-2023.  home IV ABX with daptomycin 8mg /kg/day plus ceftriaxone 2gm IV daily. End date:Jan 2nd 2025. Plan for DC on Monday, August 10, 2023  08-08-2023 will place foley catheter tomorrow night. This can be removed by pt's son-in-law that is a Careers adviser in [place]. Pharmacy to dose IV ABX early on Monday AM so pt can be discharged.  08-09-2023 will place foley tonight. Had messaged pharmacist to adjust IV ABX administration time so pt is ready to leave by 8  AM Monday morning.  08-10-2023 ready for DC. Will DC with foley and PICC line. Foley to be removed today after pt arrives in [place].  End Date abx Sep 17, 2023.  Remains on Daptomyin IV 500 every day (8 mg/kg/day) and IV rocephin 2 gram every day.  Hyponatremia 08-05-2023 repeat BMP in AM.  08-06-2023 BMP pending today.  08-07-2023 resolved. Na yesterday 139. Likely due to poor po intake.  08-08-2023 Na 139 today.  08-10-2023 DC Na 141  Chronic obstructive pulmonary disease (HCC) 08-05-2023 stable.  08-10-2023 remains on singulair 10 mg at bedtime, dulera (200-5) 2 puffs bid, prn xopenex and prn albuterol  Hyperthyroidism 08-05-2023 continue methimazole  08-10-2023 continue with methimazole at discharge. Alternating doses of 5 mg every other day and 10 mg every other day   Essential hypertension 08-05-2023 BP meds on hold due to hypotension yesterday in ER(87/64)  08-06-2023 BP elevated this AM. Will restart ARB today.  08-07-2023 BP improved after starting ARB. Will check CMP in AM.  08-08-2023 BUN 16, scr 0.5. avapro 75 mg daily started yesterday. BP has improved  08-09-2023 BP stable and improved on avapro 75 mg daily. Will hold HCTZ at discharge.  08-10-2023 BP stable. Will DC on Avapro 75 mg daily. Stop hydrochlorothiazide at discharge       DVT prophylaxis: enoxaparin (LOVENOX) injection 40 mg Start: 08/05/23 0800 SCDs Start: 08/04/23 2210    Code Status: Full Code Family Communication: no family at bedside Disposition Plan: DC to pt's dtr home in [place] today. Reason for continuing need for hospitalization: DC today  Objective: Vitals:   08/09/23 1955 08/09/23 2043 08/10/23 0455 08/10/23 0520  BP: (!) 140/91  (!) 161/100 (!) 159/95  Pulse: 69  66 65  Resp: 18  18   Temp: 98 F (36.7 C)  98.4 F (36.9 C)   TempSrc: Oral  Oral   SpO2: 98% 97% 95%   Weight:      Height:        Intake/Output Summary (Last 24 hours) at 08/10/2023 0708 Last data  filed at 08/10/2023 0500 Gross per 24 hour  Intake 530 ml  Output 750 ml  Net -220 ml   Filed Weights   08/04/23 1426 08/05/23 1836  Weight: 56.5 kg 56.5 kg    Examination:  Physical Exam Vitals and nursing note reviewed.  HENT:     Head: Normocephalic and atraumatic.     Nose: Nose normal.  Eyes:     General: No scleral icterus. Cardiovascular:     Rate and Rhythm: Normal  rate and regular rhythm.  Pulmonary:     Effort: Pulmonary effort is normal.     Breath sounds: Normal breath sounds.  Abdominal:     General: Bowel sounds are normal.  Genitourinary:    Comments: +foley catheter Musculoskeletal:     Comments: RUE picc line  Skin:    General: Skin is warm and dry.     Capillary Refill: Capillary refill takes less than 2 seconds.  Neurological:     Mental Status: She is alert and oriented to person, place, and time.     Data Reviewed: I have personally reviewed following labs and imaging studies  CBC: Recent Labs  Lab 08/04/23 1448 08/05/23 0032 08/10/23 0336  WBC 10.6* 7.1 4.9  NEUTROABS 9.9*  --  3.1  HGB 12.9 12.3 10.9*  HCT 39.3 38.4 33.5*  MCV 97.8 100.8* 101.2*  PLT 243 188 179   Basic Metabolic Panel: Recent Labs  Lab 08/04/23 1448 08/04/23 2250 08/05/23 0032 08/06/23 0557 08/08/23 0317 08/10/23 0336  NA 138  --  132* 139 139 141  K 4.4  --  3.6 3.4* 4.1 3.7  CL 102  --  101 109 105 108  CO2 26  --  23 24 26 25   GLUCOSE 134*  --  101* 86 90 91  BUN 19  --  17 11 16 15   CREATININE 0.65 0.51 0.52 0.46  0.46 0.50 0.49  CALCIUM 9.4  --  8.3* 8.3* 8.6* 8.7*  MG  --   --   --   --   --  2.1   GFR: Estimated Creatinine Clearance: 40.7 mL/min (by C-G formula based on SCr of 0.49 mg/dL). Liver Function Tests: Recent Labs  Lab 08/04/23 1448 08/08/23 0317 08/10/23 0336  AST 15 41 78*  ALT 19 43 119*  ALKPHOS 74 74 70  BILITOT 0.8 <0.2 0.6  PROT 6.4* 6.1* 5.9*  ALBUMIN 3.5 2.9* 2.8*   Coagulation Profile: Recent Labs  Lab  08/04/23 1448  INR 1.1   Cardiac Enzymes: Recent Labs  Lab 08/05/23 1750  CKTOTAL 19*   Sepsis Labs: Recent Labs  Lab 08/04/23 1448 08/04/23 1732 08/04/23 2009 08/04/23 2215  LATICACIDVEN 1.8 2.0* 1.6 1.2    Recent Results (from the past 240 hour(s))  Culture, blood (Routine x 2)     Status: None   Collection Time: 08/04/23  2:29 PM   Specimen: BLOOD RIGHT ARM  Result Value Ref Range Status   Specimen Description   Final    BLOOD RIGHT ARM Performed at [facility], [address] [place], [place] [address]    Special Requests   Final    Blood Culture adequate volume BOTTLES DRAWN AEROBIC AND ANAEROBIC Performed at [facility], [address] [place], [place] [address]    Culture   Final    NO GROWTH 5 DAYS Performed at [facility], [address] [place], [place] [address]    Report Status 08/09/2023 FINAL  Final  Culture, blood (Routine x 2)     Status: None   Collection Time: 08/04/23  2:34 PM   Specimen: BLOOD LEFT ARM  Result Value Ref Range Status   Specimen Description   Final    BLOOD LEFT ARM Performed at [facility], [address], [place], [place] [address]    Special Requests   Final    Blood Culture adequate volume BOTTLES DRAWN AEROBIC AND ANAEROBIC Performed at [facility], [address] [place], [place] [address]    Culture   Final    NO GROWTH 5 DAYS Performed at [facility], [address], [place], [place] [address]    Report Status 08/09/2023 FINAL  Final  Resp panel by RT-PCR (RSV, Flu A&B, Covid) Anterior Nasal Swab     Status: None   Collection Time: 08/04/23  4:03 PM   Specimen: Anterior Nasal Swab  Result Value Ref Range Status   SARS Coronavirus 2 by RT PCR NEGATIVE NEGATIVE Final    Comment: (NOTE) SARS-CoV-2 target nucleic acids are NOT DETECTED.  The SARS-CoV-2 RNA is generally detectable in upper respiratory specimens during the acute phase of  infection. The lowest concentration of SARS-CoV-2 viral copies this assay can detect is 138 copies/mL. A negative result does not preclude SARS-Cov-2 infection and should not be used as the sole basis for treatment or other patient management decisions. A negative result may occur with  improper specimen collection/handling, submission of specimen other than nasopharyngeal swab, presence of viral mutation(s) within the areas targeted by this assay, and inadequate number of viral copies(<138 copies/mL). A negative result must be combined with clinical observations, patient history, and epidemiological information. The expected result is Negative.  Fact Sheet for Patients:  BloggerCourse.com  Fact Sheet for Healthcare Providers:  SeriousBroker.it  This test is no t yet approved or cleared by the [place] FDA and  has been authorized for detection and/or diagnosis of SARS-CoV-2 by FDA under an Emergency Use Authorization (EUA). This EUA will remain  in effect (meaning this test can be used) for the duration of the COVID-19 declaration under Section 564(b)(1) of the Act, 21 U.S.C.section 360bbb-3(b)(1), unless the authorization is terminated  or revoked sooner.       Influenza A by PCR NEGATIVE NEGATIVE Final   Influenza B by PCR NEGATIVE NEGATIVE Final    Comment: (NOTE) The Xpert Xpress SARS-CoV-2/FLU/RSV plus assay is intended as an aid in the diagnosis of influenza from Nasopharyngeal swab specimens and should not be used as a sole basis for treatment. Nasal washings and aspirates are unacceptable for Xpert Xpress SARS-CoV-2/FLU/RSV testing.  Fact Sheet for Patients: BloggerCourse.com  Fact Sheet for Healthcare Providers: SeriousBroker.it  This test is not yet approved or cleared by the [place] FDA and has been authorized for detection and/or diagnosis of SARS-CoV-2  by FDA under an Emergency Use Authorization (EUA). This EUA will remain in effect (meaning this test can be used) for the duration of the COVID-19 declaration under Section 564(b)(1) of the Act, 21 U.S.C. section 360bbb-3(b)(1), unless the authorization is terminated or revoked.     Resp Syncytial Virus by PCR NEGATIVE NEGATIVE Final    Comment: (NOTE) Fact Sheet for Patients: BloggerCourse.com  Fact Sheet for Healthcare Providers: SeriousBroker.it  This test is not yet approved or cleared by the [place] FDA and has been authorized for detection and/or diagnosis of SARS-CoV-2 by FDA under an Emergency Use Authorization (EUA). This EUA will remain in effect (meaning this test can be used) for the duration of the COVID-19 declaration under Section 564(b)(1) of the Act, 21 U.S.C. section 360bbb-3(b)(1), unless the authorization is terminated or revoked.  Performed at [facility], [address], [place], [place] [address]   Body fluid culture w Gram Stain     Status: None   Collection Time: 08/05/23  2:18 PM   Specimen: Synovium; Body Fluid  Result Value Ref Range Status   Specimen Description  SYNOVIAL  Final   Special Requests R HIP  Final   Gram Stain NO WBC SEEN NO ORGANISMS SEEN   Final   Culture   Final    NO GROWTH 3 DAYS Performed at [facility], [address] [place], [place] [address]    Report Status 08/09/2023 FINAL  Final     Radiology Studies: No results found.  Scheduled Meds:  aspirin EC  81 mg Oral Daily   Chlorhexidine Gluconate Cloth  6 each Topical Daily   enoxaparin (LOVENOX) injection  40 mg Subcutaneous Q24H   ezetimibe  10 mg Oral Daily   And   simvastatin  20 mg Oral Daily   irbesartan  75 mg Oral Daily   methIMAzole  10 mg Oral Daily   mirtazapine  30 mg Oral QHS   mometasone-formoterol  2 puff Inhalation BID   montelukast  10 mg Oral QHS   sodium  chloride flush  10-40 mL Intracatheter Q12H   sodium chloride flush  3 mL Intravenous Q12H   Continuous Infusions:  DAPTOmycin       LOS: 6 days   Time spent: 40 minutes  Carollee Herter, DO  [facility]  08/10/2023, 7:08 AM

## 2023-08-10 NOTE — Consult Note (Signed)
[facility] Liaison Consult Note    08/10/2023  Ariel Lambert 1935/09/20 [id number]  Insurance:   Primary Care Provider: Mila Palmer, MD with [facility] provider this provider is listed for the transition of care follow up appointments  and [facility] calls   [facility] Liaison screened the patient remotely at [facility].    The patient was screened for  day readmission hospitalization with noted medium high rising risk score for unplanned readmission risk 3 hospital admissions in 6 months.  The patient was assessed for potential Community Care Coordination service needs for post hospital transition for care coordination. Review of patient's electronic medical record reveals patient is transitioning to family in [place] to have ongoing IV antibiotic therapy and HH.   Plan:   Referral request for community care coordination: Will update [facility] Transition team of transition out of state per inpatient TOC notes.   [facility]BCI Community Care, Population Health does not replace or interfere with any arrangements made by the Inpatient Transition of Care team.   For questions contact:   Charlesetta Shanks, RN, BSN, CCM [facility] Liaison Direct Dial: [phone] or secure chat Email: [email or web address]

## 2023-08-11 DIAGNOSIS — I10 Essential (primary) hypertension: Secondary | ICD-10-CM | POA: Diagnosis not present

## 2023-08-11 DIAGNOSIS — R531 Weakness: Secondary | ICD-10-CM | POA: Diagnosis not present

## 2023-08-11 DIAGNOSIS — J449 Chronic obstructive pulmonary disease, unspecified: Secondary | ICD-10-CM | POA: Diagnosis not present

## 2023-08-11 DIAGNOSIS — R262 Difficulty in walking, not elsewhere classified: Secondary | ICD-10-CM | POA: Diagnosis not present

## 2023-08-11 DIAGNOSIS — M4627 Osteomyelitis of vertebra, lumbosacral region: Secondary | ICD-10-CM | POA: Diagnosis not present

## 2023-08-11 DIAGNOSIS — Z452 Encounter for adjustment and management of vascular access device: Secondary | ICD-10-CM | POA: Diagnosis not present

## 2023-08-11 DIAGNOSIS — J45909 Unspecified asthma, uncomplicated: Secondary | ICD-10-CM | POA: Diagnosis not present

## 2023-08-11 DIAGNOSIS — M4646 Discitis, unspecified, lumbar region: Secondary | ICD-10-CM | POA: Diagnosis not present

## 2023-08-11 DIAGNOSIS — E039 Hypothyroidism, unspecified: Secondary | ICD-10-CM | POA: Diagnosis not present

## 2023-08-12 ENCOUNTER — Telehealth: Payer: Self-pay

## 2023-08-14 DIAGNOSIS — I10 Essential (primary) hypertension: Secondary | ICD-10-CM | POA: Diagnosis not present

## 2023-08-14 DIAGNOSIS — Z452 Encounter for adjustment and management of vascular access device: Secondary | ICD-10-CM | POA: Diagnosis not present

## 2023-08-14 DIAGNOSIS — M4646 Discitis, unspecified, lumbar region: Secondary | ICD-10-CM | POA: Diagnosis not present

## 2023-08-14 DIAGNOSIS — M4627 Osteomyelitis of vertebra, lumbosacral region: Secondary | ICD-10-CM | POA: Diagnosis not present

## 2023-08-14 DIAGNOSIS — R262 Difficulty in walking, not elsewhere classified: Secondary | ICD-10-CM | POA: Diagnosis not present

## 2023-08-14 DIAGNOSIS — J449 Chronic obstructive pulmonary disease, unspecified: Secondary | ICD-10-CM | POA: Diagnosis not present

## 2023-08-14 DIAGNOSIS — J45909 Unspecified asthma, uncomplicated: Secondary | ICD-10-CM | POA: Diagnosis not present

## 2023-08-14 DIAGNOSIS — R531 Weakness: Secondary | ICD-10-CM | POA: Diagnosis not present

## 2023-08-14 DIAGNOSIS — E039 Hypothyroidism, unspecified: Secondary | ICD-10-CM | POA: Diagnosis not present

## 2023-08-14 NOTE — Telephone Encounter (Signed)
Home Health RN, Nehemiah Settle, called [facility] regarding coadministration of statin with Daptomycin. Patient's initial CK wnl, informed her that we will be monitoring this, and to inform us if patient experiences significant myalgias.

## 2023-08-17 DIAGNOSIS — Z452 Encounter for adjustment and management of vascular access device: Secondary | ICD-10-CM | POA: Diagnosis not present

## 2023-08-17 DIAGNOSIS — M4646 Discitis, unspecified, lumbar region: Secondary | ICD-10-CM | POA: Diagnosis not present

## 2023-08-17 DIAGNOSIS — E039 Hypothyroidism, unspecified: Secondary | ICD-10-CM | POA: Diagnosis not present

## 2023-08-17 DIAGNOSIS — M869 Osteomyelitis, unspecified: Secondary | ICD-10-CM | POA: Diagnosis not present

## 2023-08-17 DIAGNOSIS — R531 Weakness: Secondary | ICD-10-CM | POA: Diagnosis not present

## 2023-08-17 DIAGNOSIS — R262 Difficulty in walking, not elsewhere classified: Secondary | ICD-10-CM | POA: Diagnosis not present

## 2023-08-17 DIAGNOSIS — J449 Chronic obstructive pulmonary disease, unspecified: Secondary | ICD-10-CM | POA: Diagnosis not present

## 2023-08-17 DIAGNOSIS — J45909 Unspecified asthma, uncomplicated: Secondary | ICD-10-CM | POA: Diagnosis not present

## 2023-08-17 DIAGNOSIS — I10 Essential (primary) hypertension: Secondary | ICD-10-CM | POA: Diagnosis not present

## 2023-08-17 DIAGNOSIS — M4627 Osteomyelitis of vertebra, lumbosacral region: Secondary | ICD-10-CM | POA: Diagnosis not present

## 2023-08-20 DIAGNOSIS — R531 Weakness: Secondary | ICD-10-CM | POA: Diagnosis not present

## 2023-08-20 DIAGNOSIS — J45909 Unspecified asthma, uncomplicated: Secondary | ICD-10-CM | POA: Diagnosis not present

## 2023-08-20 DIAGNOSIS — J449 Chronic obstructive pulmonary disease, unspecified: Secondary | ICD-10-CM | POA: Diagnosis not present

## 2023-08-20 DIAGNOSIS — M4646 Discitis, unspecified, lumbar region: Secondary | ICD-10-CM | POA: Diagnosis not present

## 2023-08-20 DIAGNOSIS — R262 Difficulty in walking, not elsewhere classified: Secondary | ICD-10-CM | POA: Diagnosis not present

## 2023-08-20 DIAGNOSIS — I10 Essential (primary) hypertension: Secondary | ICD-10-CM | POA: Diagnosis not present

## 2023-08-20 DIAGNOSIS — Z452 Encounter for adjustment and management of vascular access device: Secondary | ICD-10-CM | POA: Diagnosis not present

## 2023-08-20 DIAGNOSIS — E039 Hypothyroidism, unspecified: Secondary | ICD-10-CM | POA: Diagnosis not present

## 2023-08-20 DIAGNOSIS — M4627 Osteomyelitis of vertebra, lumbosacral region: Secondary | ICD-10-CM | POA: Diagnosis not present

## 2023-08-22 DIAGNOSIS — M4627 Osteomyelitis of vertebra, lumbosacral region: Secondary | ICD-10-CM | POA: Diagnosis not present

## 2023-08-22 DIAGNOSIS — R262 Difficulty in walking, not elsewhere classified: Secondary | ICD-10-CM | POA: Diagnosis not present

## 2023-08-22 DIAGNOSIS — E039 Hypothyroidism, unspecified: Secondary | ICD-10-CM | POA: Diagnosis not present

## 2023-08-22 DIAGNOSIS — R531 Weakness: Secondary | ICD-10-CM | POA: Diagnosis not present

## 2023-08-22 DIAGNOSIS — J45909 Unspecified asthma, uncomplicated: Secondary | ICD-10-CM | POA: Diagnosis not present

## 2023-08-22 DIAGNOSIS — J449 Chronic obstructive pulmonary disease, unspecified: Secondary | ICD-10-CM | POA: Diagnosis not present

## 2023-08-22 DIAGNOSIS — M4646 Discitis, unspecified, lumbar region: Secondary | ICD-10-CM | POA: Diagnosis not present

## 2023-08-22 DIAGNOSIS — I10 Essential (primary) hypertension: Secondary | ICD-10-CM | POA: Diagnosis not present

## 2023-08-22 DIAGNOSIS — Z452 Encounter for adjustment and management of vascular access device: Secondary | ICD-10-CM | POA: Diagnosis not present

## 2023-08-24 DIAGNOSIS — M4646 Discitis, unspecified, lumbar region: Secondary | ICD-10-CM | POA: Diagnosis not present

## 2023-08-24 DIAGNOSIS — J45909 Unspecified asthma, uncomplicated: Secondary | ICD-10-CM | POA: Diagnosis not present

## 2023-08-24 DIAGNOSIS — Z452 Encounter for adjustment and management of vascular access device: Secondary | ICD-10-CM | POA: Diagnosis not present

## 2023-08-24 DIAGNOSIS — M869 Osteomyelitis, unspecified: Secondary | ICD-10-CM | POA: Diagnosis not present

## 2023-08-24 DIAGNOSIS — R262 Difficulty in walking, not elsewhere classified: Secondary | ICD-10-CM | POA: Diagnosis not present

## 2023-08-24 DIAGNOSIS — E039 Hypothyroidism, unspecified: Secondary | ICD-10-CM | POA: Diagnosis not present

## 2023-08-24 DIAGNOSIS — J449 Chronic obstructive pulmonary disease, unspecified: Secondary | ICD-10-CM | POA: Diagnosis not present

## 2023-08-24 DIAGNOSIS — I10 Essential (primary) hypertension: Secondary | ICD-10-CM | POA: Diagnosis not present

## 2023-08-24 DIAGNOSIS — R531 Weakness: Secondary | ICD-10-CM | POA: Diagnosis not present

## 2023-08-24 DIAGNOSIS — M4627 Osteomyelitis of vertebra, lumbosacral region: Secondary | ICD-10-CM | POA: Diagnosis not present

## 2023-08-25 ENCOUNTER — Telehealth: Payer: Self-pay

## 2023-08-25 NOTE — Telephone Encounter (Signed)
Verbal orders given to Nedra Hai, Charity fundraiser at [facility] in [place], [place] - repeat BMP. RN stated there patient hemoglobin was low also. I asked that they repeat BMP and CBC just to be sure.    Marchelle Folks if you need to speak with [facility] call back is [phone].    Shanetra Blumenstock Lesli Albee, CMA

## 2023-08-25 NOTE — Telephone Encounter (Signed)
Awesome - thank you so much Tiffany!

## 2023-08-25 NOTE — Telephone Encounter (Signed)
[place], RN at [facility] in [place], [place] called stating that the lab called her to report critical lab on patient - Calcium is 3.2, Labs are being faxed to our office as well.   Patient is currently in [place] for the holidays.    Ariel Lambert Lesli Albee, CMA

## 2023-08-25 NOTE — Telephone Encounter (Signed)
Honestly all of her BMP from 12/9 on [facility] link is all out of whack. Glucose is 50, sodium 147, chloride 129, CO2 10.Marland KitchenMarland KitchenI would have them repeat as able. Might be a sample issue since her other BMPs have been fine.

## 2023-08-26 DIAGNOSIS — R262 Difficulty in walking, not elsewhere classified: Secondary | ICD-10-CM | POA: Diagnosis not present

## 2023-08-26 DIAGNOSIS — J449 Chronic obstructive pulmonary disease, unspecified: Secondary | ICD-10-CM | POA: Diagnosis not present

## 2023-08-26 DIAGNOSIS — M4627 Osteomyelitis of vertebra, lumbosacral region: Secondary | ICD-10-CM | POA: Diagnosis not present

## 2023-08-26 DIAGNOSIS — M4646 Discitis, unspecified, lumbar region: Secondary | ICD-10-CM | POA: Diagnosis not present

## 2023-08-26 DIAGNOSIS — I10 Essential (primary) hypertension: Secondary | ICD-10-CM | POA: Diagnosis not present

## 2023-08-26 DIAGNOSIS — E039 Hypothyroidism, unspecified: Secondary | ICD-10-CM | POA: Diagnosis not present

## 2023-08-26 DIAGNOSIS — Z452 Encounter for adjustment and management of vascular access device: Secondary | ICD-10-CM | POA: Diagnosis not present

## 2023-08-26 DIAGNOSIS — J45909 Unspecified asthma, uncomplicated: Secondary | ICD-10-CM | POA: Diagnosis not present

## 2023-08-26 DIAGNOSIS — R531 Weakness: Secondary | ICD-10-CM | POA: Diagnosis not present

## 2023-08-27 DIAGNOSIS — J45909 Unspecified asthma, uncomplicated: Secondary | ICD-10-CM | POA: Diagnosis not present

## 2023-08-27 DIAGNOSIS — M4646 Discitis, unspecified, lumbar region: Secondary | ICD-10-CM | POA: Diagnosis not present

## 2023-08-27 DIAGNOSIS — R531 Weakness: Secondary | ICD-10-CM | POA: Diagnosis not present

## 2023-08-27 DIAGNOSIS — Z452 Encounter for adjustment and management of vascular access device: Secondary | ICD-10-CM | POA: Diagnosis not present

## 2023-08-27 DIAGNOSIS — J449 Chronic obstructive pulmonary disease, unspecified: Secondary | ICD-10-CM | POA: Diagnosis not present

## 2023-08-27 DIAGNOSIS — R262 Difficulty in walking, not elsewhere classified: Secondary | ICD-10-CM | POA: Diagnosis not present

## 2023-08-27 DIAGNOSIS — I10 Essential (primary) hypertension: Secondary | ICD-10-CM | POA: Diagnosis not present

## 2023-08-27 DIAGNOSIS — E039 Hypothyroidism, unspecified: Secondary | ICD-10-CM | POA: Diagnosis not present

## 2023-08-27 DIAGNOSIS — M4627 Osteomyelitis of vertebra, lumbosacral region: Secondary | ICD-10-CM | POA: Diagnosis not present

## 2023-08-27 NOTE — Telephone Encounter (Signed)
Spoke with patient and her daughter, Ariel Lambert, about low hemoglobin results. Dr. Drue Second recommends her going to the emergency room ASAP for possible blood transfusion. She denied any heart palpitations, dizziness, fatigue, or other concerns. They stated their [facility] nurse was collecting the repeat blood draw for today as I called. They said they would speak with their family and then make a decision. I asked them to please let me know what their plans are and emphasized the importance of treating low hemoglobin quickly. We may not have repeat results until next week. I will check [facility] Link tomorrow morning.  [name], PharmD, CPP, BCIDP, AAHIVP Clinical Pharmacist Practitioner Infectious Diseases Clinical Pharmacist [facility] for [facility]

## 2023-08-27 NOTE — Telephone Encounter (Signed)
Still have not received any updated labs. Sent secure chat to Dr. Drue Second about any management recommendations.

## 2023-08-28 DIAGNOSIS — E039 Hypothyroidism, unspecified: Secondary | ICD-10-CM | POA: Diagnosis not present

## 2023-08-28 DIAGNOSIS — R262 Difficulty in walking, not elsewhere classified: Secondary | ICD-10-CM | POA: Diagnosis not present

## 2023-08-28 DIAGNOSIS — R531 Weakness: Secondary | ICD-10-CM | POA: Diagnosis not present

## 2023-08-28 DIAGNOSIS — J449 Chronic obstructive pulmonary disease, unspecified: Secondary | ICD-10-CM | POA: Diagnosis not present

## 2023-08-28 DIAGNOSIS — Z452 Encounter for adjustment and management of vascular access device: Secondary | ICD-10-CM | POA: Diagnosis not present

## 2023-08-28 DIAGNOSIS — M4627 Osteomyelitis of vertebra, lumbosacral region: Secondary | ICD-10-CM | POA: Diagnosis not present

## 2023-08-28 DIAGNOSIS — I10 Essential (primary) hypertension: Secondary | ICD-10-CM | POA: Diagnosis not present

## 2023-08-28 DIAGNOSIS — M4646 Discitis, unspecified, lumbar region: Secondary | ICD-10-CM | POA: Diagnosis not present

## 2023-08-28 DIAGNOSIS — J45909 Unspecified asthma, uncomplicated: Secondary | ICD-10-CM | POA: Diagnosis not present

## 2023-08-28 NOTE — Telephone Encounter (Signed)
Reviewed repeat labs from 12/12 which all returned normal. Hemoglobin within normal range. All BMP within normal range. Relayed lab results to patient who was relieved.   [name], PharmD, CPP, BCIDP, AAHIVP Clinical Pharmacist Practitioner Infectious Diseases Clinical Pharmacist [facility] for [facility]

## 2023-09-01 DIAGNOSIS — I10 Essential (primary) hypertension: Secondary | ICD-10-CM | POA: Diagnosis not present

## 2023-09-01 DIAGNOSIS — J449 Chronic obstructive pulmonary disease, unspecified: Secondary | ICD-10-CM | POA: Diagnosis not present

## 2023-09-01 DIAGNOSIS — M4627 Osteomyelitis of vertebra, lumbosacral region: Secondary | ICD-10-CM | POA: Diagnosis not present

## 2023-09-01 DIAGNOSIS — R262 Difficulty in walking, not elsewhere classified: Secondary | ICD-10-CM | POA: Diagnosis not present

## 2023-09-01 DIAGNOSIS — M4646 Discitis, unspecified, lumbar region: Secondary | ICD-10-CM | POA: Diagnosis not present

## 2023-09-01 DIAGNOSIS — Z5181 Encounter for therapeutic drug level monitoring: Secondary | ICD-10-CM | POA: Diagnosis not present

## 2023-09-01 DIAGNOSIS — J45909 Unspecified asthma, uncomplicated: Secondary | ICD-10-CM | POA: Diagnosis not present

## 2023-09-01 DIAGNOSIS — Z452 Encounter for adjustment and management of vascular access device: Secondary | ICD-10-CM | POA: Diagnosis not present

## 2023-09-01 DIAGNOSIS — E039 Hypothyroidism, unspecified: Secondary | ICD-10-CM | POA: Diagnosis not present

## 2023-09-01 DIAGNOSIS — Z792 Long term (current) use of antibiotics: Secondary | ICD-10-CM | POA: Diagnosis not present

## 2023-09-01 DIAGNOSIS — R531 Weakness: Secondary | ICD-10-CM | POA: Diagnosis not present

## 2023-09-03 DIAGNOSIS — R262 Difficulty in walking, not elsewhere classified: Secondary | ICD-10-CM | POA: Diagnosis not present

## 2023-09-03 DIAGNOSIS — J45909 Unspecified asthma, uncomplicated: Secondary | ICD-10-CM | POA: Diagnosis not present

## 2023-09-03 DIAGNOSIS — I10 Essential (primary) hypertension: Secondary | ICD-10-CM | POA: Diagnosis not present

## 2023-09-03 DIAGNOSIS — Z452 Encounter for adjustment and management of vascular access device: Secondary | ICD-10-CM | POA: Diagnosis not present

## 2023-09-03 DIAGNOSIS — M4627 Osteomyelitis of vertebra, lumbosacral region: Secondary | ICD-10-CM | POA: Diagnosis not present

## 2023-09-03 DIAGNOSIS — J449 Chronic obstructive pulmonary disease, unspecified: Secondary | ICD-10-CM | POA: Diagnosis not present

## 2023-09-03 DIAGNOSIS — R531 Weakness: Secondary | ICD-10-CM | POA: Diagnosis not present

## 2023-09-03 DIAGNOSIS — M4646 Discitis, unspecified, lumbar region: Secondary | ICD-10-CM | POA: Diagnosis not present

## 2023-09-03 DIAGNOSIS — E039 Hypothyroidism, unspecified: Secondary | ICD-10-CM | POA: Diagnosis not present

## 2023-09-04 DIAGNOSIS — M4646 Discitis, unspecified, lumbar region: Secondary | ICD-10-CM | POA: Diagnosis not present

## 2023-09-07 DIAGNOSIS — E039 Hypothyroidism, unspecified: Secondary | ICD-10-CM | POA: Diagnosis not present

## 2023-09-07 DIAGNOSIS — I10 Essential (primary) hypertension: Secondary | ICD-10-CM | POA: Diagnosis not present

## 2023-09-07 DIAGNOSIS — J449 Chronic obstructive pulmonary disease, unspecified: Secondary | ICD-10-CM | POA: Diagnosis not present

## 2023-09-07 DIAGNOSIS — M4627 Osteomyelitis of vertebra, lumbosacral region: Secondary | ICD-10-CM | POA: Diagnosis not present

## 2023-09-07 DIAGNOSIS — Z452 Encounter for adjustment and management of vascular access device: Secondary | ICD-10-CM | POA: Diagnosis not present

## 2023-09-07 DIAGNOSIS — M4646 Discitis, unspecified, lumbar region: Secondary | ICD-10-CM | POA: Diagnosis not present

## 2023-09-07 DIAGNOSIS — R262 Difficulty in walking, not elsewhere classified: Secondary | ICD-10-CM | POA: Diagnosis not present

## 2023-09-07 DIAGNOSIS — J45909 Unspecified asthma, uncomplicated: Secondary | ICD-10-CM | POA: Diagnosis not present

## 2023-09-07 DIAGNOSIS — R531 Weakness: Secondary | ICD-10-CM | POA: Diagnosis not present

## 2023-09-09 DIAGNOSIS — M4646 Discitis, unspecified, lumbar region: Secondary | ICD-10-CM | POA: Diagnosis not present

## 2023-09-11 ENCOUNTER — Telehealth: Payer: Self-pay

## 2023-09-11 DIAGNOSIS — M4646 Discitis, unspecified, lumbar region: Secondary | ICD-10-CM | POA: Diagnosis not present

## 2023-09-11 NOTE — Telephone Encounter (Signed)
Results from 09/07/23  CRP 15 (ref range < 10)  CBC and ESR not completed due to sample hemolyzation. Spoke with Leigh at [facility] and requested these be repeated. She states it will likely be Monday 12/30 before these can be collected.   Call back for [facility] is [phone] if any changes need to be made.   Sandie Ano, RN

## 2023-09-16 DIAGNOSIS — M4646 Discitis, unspecified, lumbar region: Secondary | ICD-10-CM | POA: Diagnosis not present

## 2023-09-17 ENCOUNTER — Telehealth: Payer: Self-pay | Admitting: Internal Medicine

## 2023-09-17 NOTE — Telephone Encounter (Signed)
 Per Dr. Luiz: can you extend the daptomycin  and ceftriaxone  by one more week so that the end date is 1/13, then pull picc line then.  Gave verbal orders to extend OPAT to 09/28/23 and pull PICC after last dose to Tarita with [facility] in [place].   Update sent to Holley Herring, RN with [facility].   Called patient and her daughter back to provide update, no answer. Left HIPAA compliant voicemail requesting callback.   [facility]: [phone]   Duwaine JONETTA Lowe, RN

## 2023-09-17 NOTE — Telephone Encounter (Signed)
 Notified Dr. Drue Second, she will review patient's chart to see if any additional intervention is needed.   Sandie Ano, RN

## 2023-09-17 NOTE — Telephone Encounter (Signed)
 Nianna's daughter, Olam, called to cancel her upcoming appointment. She stated the patient will be staying with her in [place]  for about 3 more weeks and will reschedule when she returns. She believes the infection is gone and they have no concerns. Olam and Kadedra can be reached at [phone].

## 2023-09-18 ENCOUNTER — Telehealth: Payer: Self-pay | Admitting: Internal Medicine

## 2023-09-18 DIAGNOSIS — I10 Essential (primary) hypertension: Secondary | ICD-10-CM | POA: Diagnosis not present

## 2023-09-18 DIAGNOSIS — J45909 Unspecified asthma, uncomplicated: Secondary | ICD-10-CM | POA: Diagnosis not present

## 2023-09-18 DIAGNOSIS — Z452 Encounter for adjustment and management of vascular access device: Secondary | ICD-10-CM | POA: Diagnosis not present

## 2023-09-18 DIAGNOSIS — M4646 Discitis, unspecified, lumbar region: Secondary | ICD-10-CM | POA: Diagnosis not present

## 2023-09-18 DIAGNOSIS — E039 Hypothyroidism, unspecified: Secondary | ICD-10-CM | POA: Diagnosis not present

## 2023-09-18 DIAGNOSIS — J449 Chronic obstructive pulmonary disease, unspecified: Secondary | ICD-10-CM | POA: Diagnosis not present

## 2023-09-18 DIAGNOSIS — R531 Weakness: Secondary | ICD-10-CM | POA: Diagnosis not present

## 2023-09-18 DIAGNOSIS — R262 Difficulty in walking, not elsewhere classified: Secondary | ICD-10-CM | POA: Diagnosis not present

## 2023-09-18 DIAGNOSIS — M4627 Osteomyelitis of vertebra, lumbosacral region: Secondary | ICD-10-CM | POA: Diagnosis not present

## 2023-09-18 MED ORDER — CEPHALEXIN 500 MG PO CAPS: 500.0000 mg | ORAL_CAPSULE | Freq: Three times a day (TID) | ORAL | 0 refills | Status: DC

## 2023-09-18 MED ORDER — DOXYCYCLINE HYCLATE 100 MG PO TABS: 100.0000 mg | ORAL_TABLET | Freq: Two times a day (BID) | ORAL | 0 refills | Status: DC

## 2023-09-18 NOTE — Telephone Encounter (Signed)
 Patient's daughter stating she does not want to extend IV abx for her mother. She feels her mother's infection is clear. She reports patient is having diarrhea, abdominal pain and vomiting She feels it is due to the IV abx. Per Dr. Luiz she will reach out to the patient's daughter. Metzli Pollick ONEIDA Ligas, CMA

## 2023-09-18 NOTE — Telephone Encounter (Signed)
 Will plan on stopping iv abtx on 1/6. Plan to switch her to oral abtx of doxy plus cephalexin. We will see her back in the office in 4-5 wk.plan to repeat imaging of spine.

## 2023-09-21 ENCOUNTER — Inpatient Hospital Stay: Payer: Medicare PPO | Admitting: Internal Medicine

## 2023-09-22 DIAGNOSIS — R531 Weakness: Secondary | ICD-10-CM | POA: Diagnosis not present

## 2023-09-22 DIAGNOSIS — M4646 Discitis, unspecified, lumbar region: Secondary | ICD-10-CM | POA: Diagnosis not present

## 2023-09-22 DIAGNOSIS — R262 Difficulty in walking, not elsewhere classified: Secondary | ICD-10-CM | POA: Diagnosis not present

## 2023-09-22 DIAGNOSIS — Z452 Encounter for adjustment and management of vascular access device: Secondary | ICD-10-CM | POA: Diagnosis not present

## 2023-09-22 DIAGNOSIS — M4627 Osteomyelitis of vertebra, lumbosacral region: Secondary | ICD-10-CM | POA: Diagnosis not present

## 2023-09-22 DIAGNOSIS — J449 Chronic obstructive pulmonary disease, unspecified: Secondary | ICD-10-CM | POA: Diagnosis not present

## 2023-09-22 DIAGNOSIS — J45909 Unspecified asthma, uncomplicated: Secondary | ICD-10-CM | POA: Diagnosis not present

## 2023-09-22 DIAGNOSIS — I10 Essential (primary) hypertension: Secondary | ICD-10-CM | POA: Diagnosis not present

## 2023-09-22 DIAGNOSIS — E039 Hypothyroidism, unspecified: Secondary | ICD-10-CM | POA: Diagnosis not present

## 2023-09-23 DIAGNOSIS — R531 Weakness: Secondary | ICD-10-CM | POA: Diagnosis not present

## 2023-09-23 DIAGNOSIS — R262 Difficulty in walking, not elsewhere classified: Secondary | ICD-10-CM | POA: Diagnosis not present

## 2023-09-23 DIAGNOSIS — M4627 Osteomyelitis of vertebra, lumbosacral region: Secondary | ICD-10-CM | POA: Diagnosis not present

## 2023-09-23 DIAGNOSIS — I10 Essential (primary) hypertension: Secondary | ICD-10-CM | POA: Diagnosis not present

## 2023-09-23 DIAGNOSIS — Z452 Encounter for adjustment and management of vascular access device: Secondary | ICD-10-CM | POA: Diagnosis not present

## 2023-09-23 DIAGNOSIS — J449 Chronic obstructive pulmonary disease, unspecified: Secondary | ICD-10-CM | POA: Diagnosis not present

## 2023-09-23 DIAGNOSIS — M4646 Discitis, unspecified, lumbar region: Secondary | ICD-10-CM | POA: Diagnosis not present

## 2023-09-23 DIAGNOSIS — J45909 Unspecified asthma, uncomplicated: Secondary | ICD-10-CM | POA: Diagnosis not present

## 2023-09-23 DIAGNOSIS — E039 Hypothyroidism, unspecified: Secondary | ICD-10-CM | POA: Diagnosis not present

## 2023-09-29 DIAGNOSIS — Z452 Encounter for adjustment and management of vascular access device: Secondary | ICD-10-CM | POA: Diagnosis not present

## 2023-09-29 DIAGNOSIS — R262 Difficulty in walking, not elsewhere classified: Secondary | ICD-10-CM | POA: Diagnosis not present

## 2023-09-29 DIAGNOSIS — R531 Weakness: Secondary | ICD-10-CM | POA: Diagnosis not present

## 2023-09-29 DIAGNOSIS — M4646 Discitis, unspecified, lumbar region: Secondary | ICD-10-CM | POA: Diagnosis not present

## 2023-09-29 DIAGNOSIS — M4627 Osteomyelitis of vertebra, lumbosacral region: Secondary | ICD-10-CM | POA: Diagnosis not present

## 2023-09-29 DIAGNOSIS — J45909 Unspecified asthma, uncomplicated: Secondary | ICD-10-CM | POA: Diagnosis not present

## 2023-09-29 DIAGNOSIS — J449 Chronic obstructive pulmonary disease, unspecified: Secondary | ICD-10-CM | POA: Diagnosis not present

## 2023-09-29 DIAGNOSIS — I10 Essential (primary) hypertension: Secondary | ICD-10-CM | POA: Diagnosis not present

## 2023-09-29 DIAGNOSIS — E039 Hypothyroidism, unspecified: Secondary | ICD-10-CM | POA: Diagnosis not present

## 2023-09-30 ENCOUNTER — Other Ambulatory Visit: Payer: Self-pay

## 2023-09-30 DIAGNOSIS — E0590 Thyrotoxicosis, unspecified without thyrotoxic crisis or storm: Secondary | ICD-10-CM

## 2023-10-05 IMAGING — CT CT HEAD W/O CM
4 series · 16 of 47 positions shown, 18 images · non-contrast
Comparison: Head CT 10/06/2021

CLINICAL DATA: Mental status change, unknown cause



[Series 2: head wo · axial · 0.42mm/px · z∈[+1002,+1102]mm · 7 of 28 slices shown, 9 images]
[im 4/28  brain]
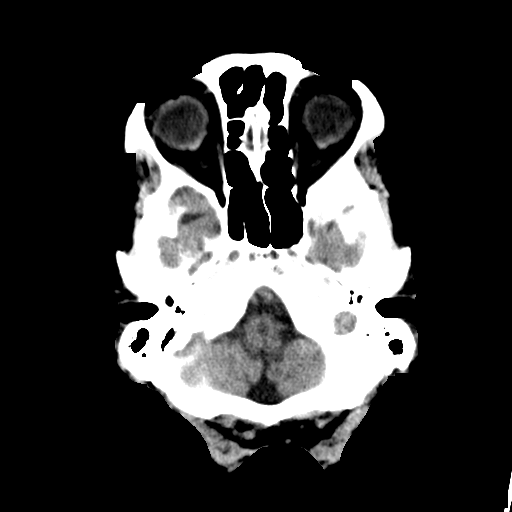
[im 4/28  bone]
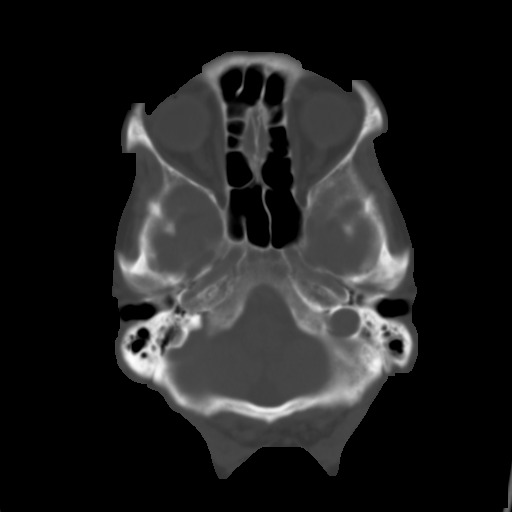
[im 7/28  brain]
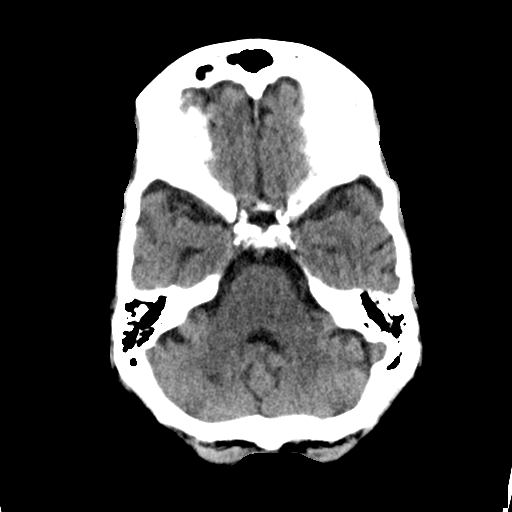
[im 11/28  brain]
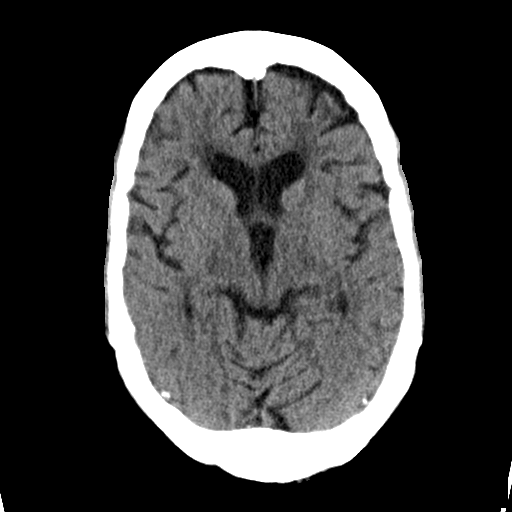
[im 14/28  brain]
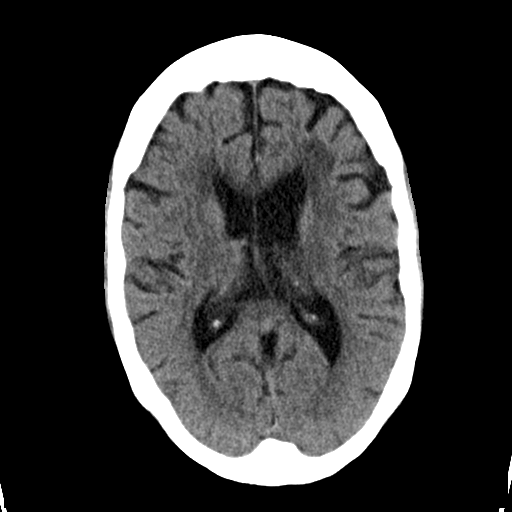
[im 17/28  brain]
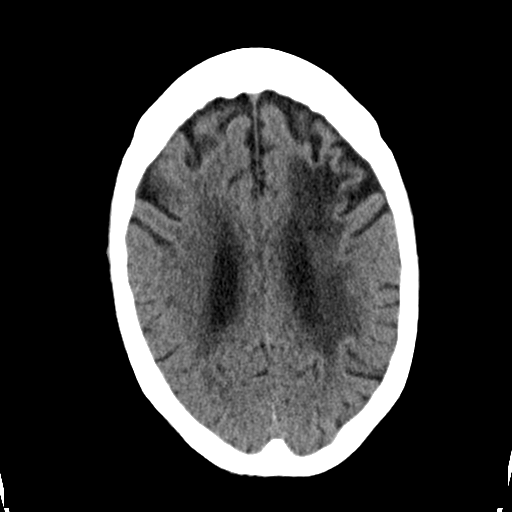
[im 17/28  bone]
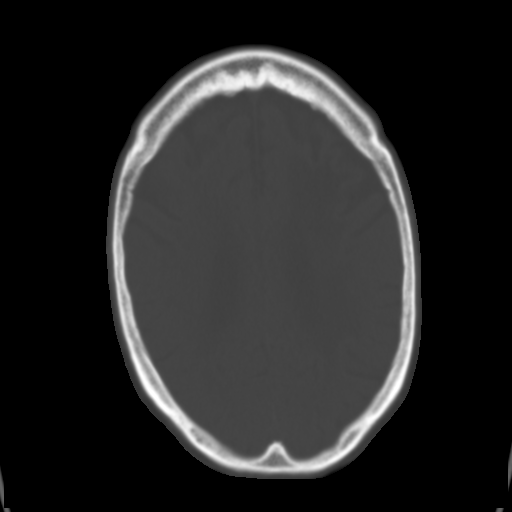
[im 21/28  brain]
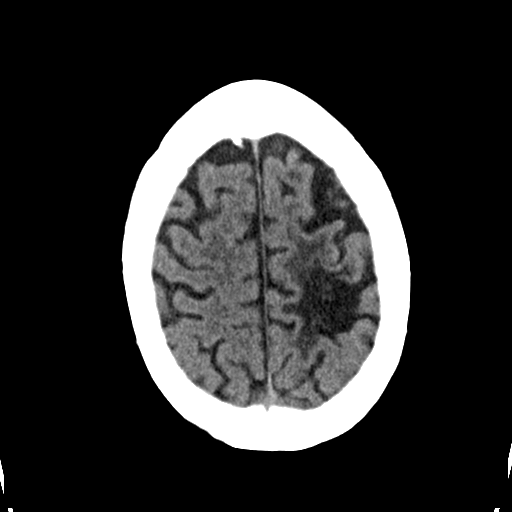
[im 24/28  brain]
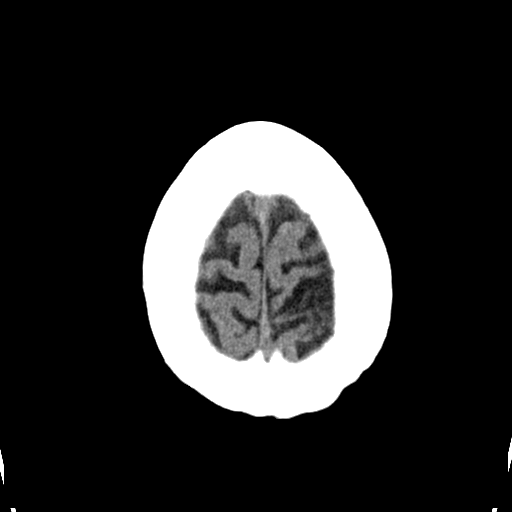

[Series 3: head bone · axial · 0.42mm/px · z∈[+999,+1027]mm · 3 of 70 slices shown]
[im 7/70  bone]
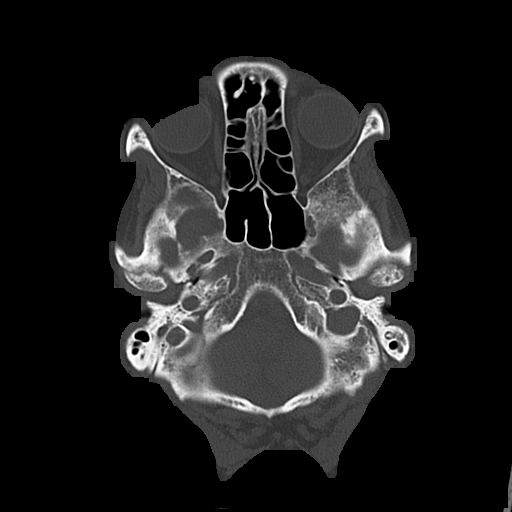
[im 14/70  bone]
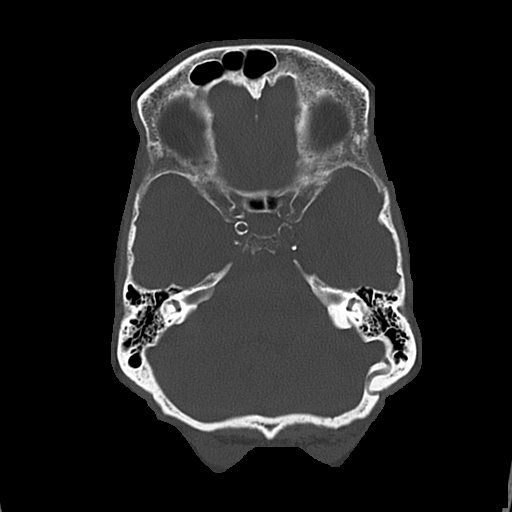
[im 21/70  bone]
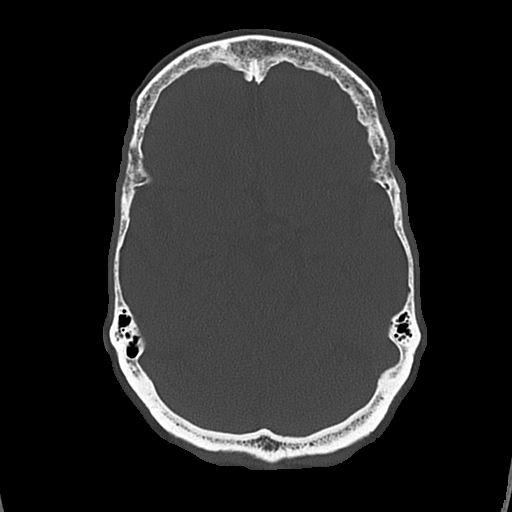

[Series 4: coronal soft · coronal · 0.29mm/px · 3 of 66 slices shown]
[im 22/66  brain]
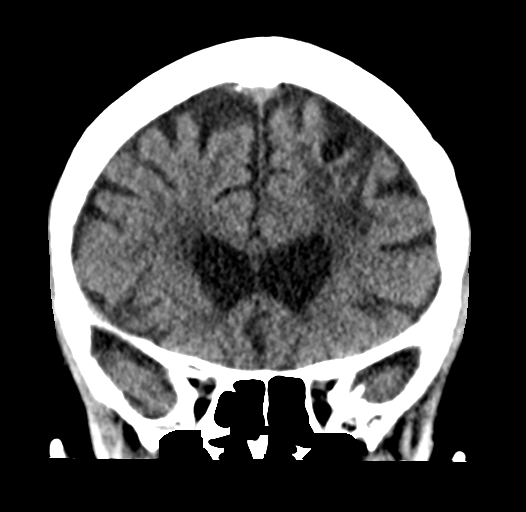
[im 29/66  brain]
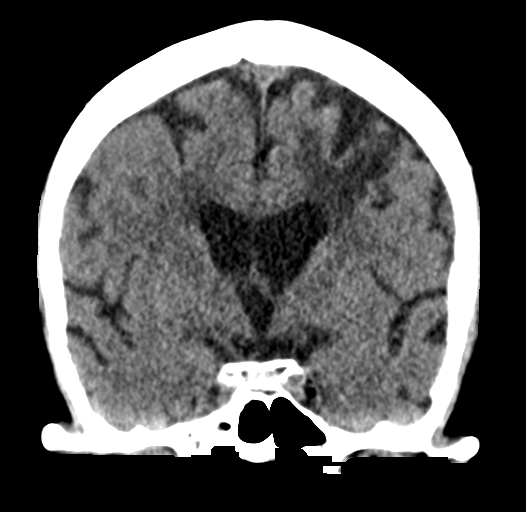
[im 37/66  brain]
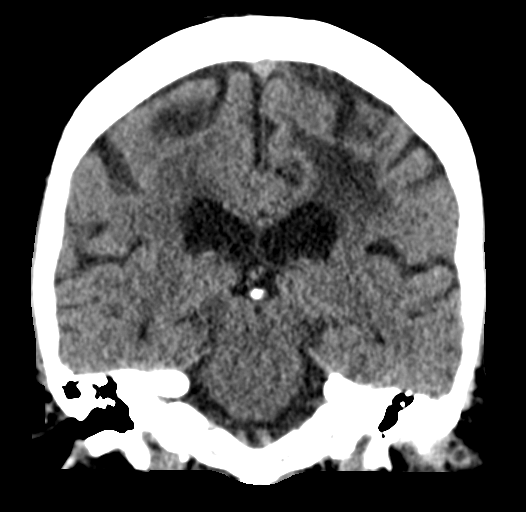

[Series 5: sagittal soft · sagittal · 0.31mm/px · 3 of 50 slices shown]
[im 17/50  brain]
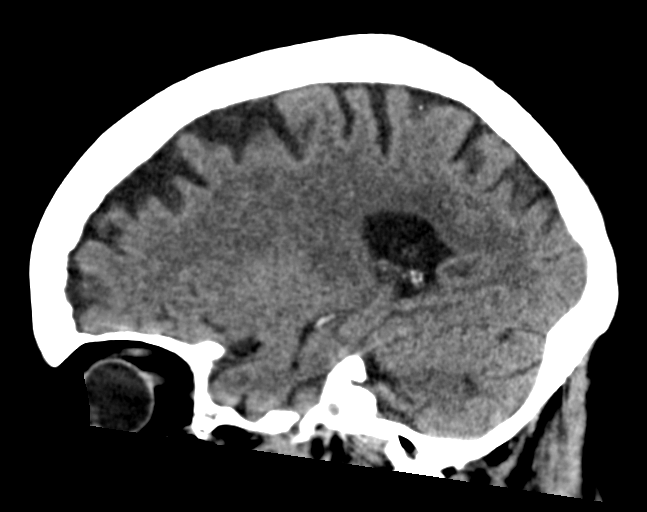
[im 25/50  brain]
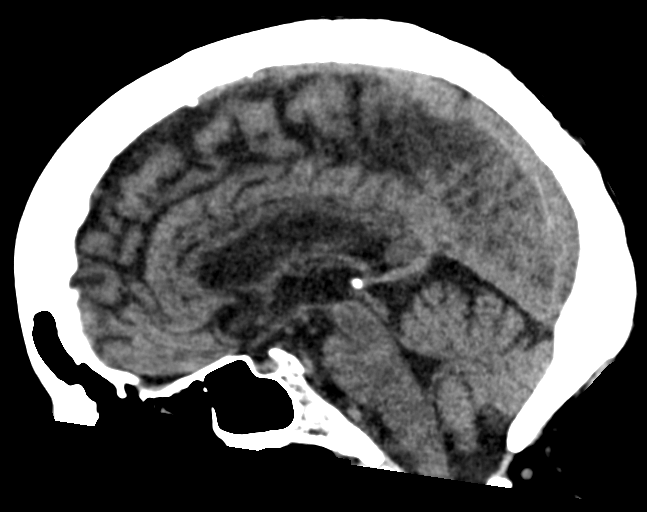
[im 33/50  brain]
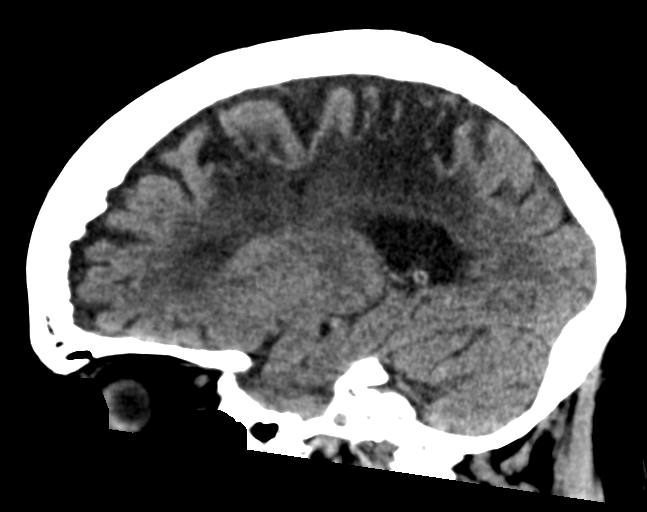

[16 of 47 positions shown; findings below may reference images not displayed]

FINDINGS: Brain: No evidence of acute intracranial hemorrhage or extra-axial
collection.Unchanged left MCA territory encephalomalacia from prior
infarct.The ventricles are unchanged in size.Scattered subcortical
and periventricular white matter hypodensities, nonspecific but
likely sequela of chronic small vessel ischemic disease.Mild
cerebral atrophy

Vascular: Vascular calcifications.

Skull: Normal. Negative for fracture or focal lesion.

Sinuses/Orbits: No acute finding.

Other: None.
IMPRESSION: No acute intracranial abnormality.

Chronic left MCA territory infarct.

Chronic small vessel ischemic disease.

## 2023-10-07 ENCOUNTER — Other Ambulatory Visit: Payer: Medicare PPO

## 2023-10-07 DIAGNOSIS — E0590 Thyrotoxicosis, unspecified without thyrotoxic crisis or storm: Secondary | ICD-10-CM | POA: Diagnosis not present

## 2023-10-08 LAB — T4, FREE: Free T4: 1.2 ng/dL (ref 0.8–1.8)

## 2023-10-08 LAB — T3, FREE: T3, Free: 3.3 pg/mL (ref 2.3–4.2)

## 2023-10-08 LAB — TSH: TSH: 1.14 m[IU]/L (ref 0.40–4.50)

## 2023-10-10 DIAGNOSIS — M4646 Discitis, unspecified, lumbar region: Secondary | ICD-10-CM | POA: Diagnosis not present

## 2023-10-12 ENCOUNTER — Ambulatory Visit: Payer: Medicare PPO | Admitting: Internal Medicine

## 2023-10-12 ENCOUNTER — Other Ambulatory Visit: Payer: Self-pay

## 2023-10-12 VITALS — BP 145/95 | HR 79 | Temp 97.9°F | Wt 127.0 lb

## 2023-10-12 DIAGNOSIS — I10 Essential (primary) hypertension: Secondary | ICD-10-CM

## 2023-10-12 DIAGNOSIS — Z87891 Personal history of nicotine dependence: Secondary | ICD-10-CM

## 2023-10-12 DIAGNOSIS — M4647 Discitis, unspecified, lumbosacral region: Secondary | ICD-10-CM

## 2023-10-12 DIAGNOSIS — M4646 Discitis, unspecified, lumbar region: Secondary | ICD-10-CM | POA: Diagnosis not present

## 2023-10-12 DIAGNOSIS — Z79899 Other long term (current) drug therapy: Secondary | ICD-10-CM

## 2023-10-12 NOTE — Progress Notes (Unsigned)
Patient ID: Ariel Lambert, female   DOB: [birth year], 88 y.o.   MRN: [id number]  HPI Has been back for 1 week from her daughter in [place]. On keflex and doxy  Outpatient Encounter Medications as of 10/12/2023  Medication Sig   acetaminophen (TYLENOL) 325 MG tablet Take 2 tablets (650 mg total) by mouth every 6 (six) hours as needed for fever, headache or mild pain.   albuterol (VENTOLIN HFA) 108 (90 Base) MCG/ACT inhaler Inhale 2 puffs into the lungs every 6 (six) hours as needed for wheezing or shortness of breath (Asthma).   aspirin EC 81 MG tablet Take 81 mg by mouth daily. Swallow whole.   azelastine (ASTELIN) 0.1 % nasal spray Place 2 sprays into both nostrils every evening. Use in each nostril as directed   Biotin 5000 MCG CAPS Take 5,000 mcg by mouth daily.   Calcium Carb-Cholecalciferol (CALCIUM 600+D3) 600-20 MG-MCG TABS Take 1 tablet by mouth 2 (two) times daily.   cephALEXin (KEFLEX) 500 MG capsule Take 1 capsule (500 mg total) by mouth 3 (three) times daily.   diphenhydrAMINE (BENADRYL) 25 MG tablet Take 25 mg by mouth daily as needed for allergies.   doxycycline (VIBRA-TABS) 100 MG tablet Take 1 tablet (100 mg total) by mouth 2 (two) times daily.   ezetimibe-simvastatin (VYTORIN) 10-20 MG tablet Take 1 tablet by mouth daily after breakfast.   hydrocortisone cream 1 % Apply 1 Application topically daily as needed (Ezema).   hydroxypropyl methylcellulose / hypromellose (ISOPTO TEARS / GONIOVISC) 2.5 % ophthalmic solution Place 1 drop into both eyes daily.   irbesartan (AVAPRO) 75 MG tablet Take 1 tablet (75 mg total) by mouth daily.   levalbuterol (XOPENEX) 0.63 MG/3ML nebulizer solution Take 3 mLs (0.63 mg total) by nebulization every 6 (six) hours as needed for up to 120 doses for wheezing or shortness of breath.   MAGNESIUM GLUCONATE PO Take 400 mg by mouth daily.   methimazole (TAPAZOLE) 10 MG tablet Take 0.5-1 tablets (5-10 mg total) by mouth See admin  instructions. Alternate 5 mg one day and 10 mg the next day   mirtazapine (REMERON SOL-TAB) 30 MG disintegrating tablet Take 1 tablet (30 mg total) by mouth at bedtime.   montelukast (SINGULAIR) 10 MG tablet Take 1 tablet (10 mg total) by mouth at bedtime.   Multiple Vitamin (MULTIVITAMIN WITH MINERALS) TABS tablet Take 1 tablet by mouth daily.   senna (SENOKOT) 8.6 MG TABS tablet Take 1 tablet (8.6 mg total) by mouth 2 (two) times daily.   SYMBICORT 160-4.5 MCG/ACT inhaler Inhale 2 puffs into the lungs 2 (two) times daily.   fluticasone (FLONASE) 50 MCG/ACT nasal spray Place 2 sprays into both nostrils daily as needed for allergies or rhinitis. (Patient not taking: Reported on 08/05/2023)   loratadine (ALLERGY RELIEF) 10 MG tablet Take 10 mg by mouth daily. (Patient not taking: Reported on 08/05/2023)   triamcinolone ointment (KENALOG) 0.1 % Apply 1 Application topically daily as needed (unknown). (Patient not taking: Reported on 08/05/2023)   No facility-administered encounter medications on file as of 10/12/2023.     Patient Active Problem List   Diagnosis Date Noted   Lumbar discitis 08/05/2023   Hyponatremia 08/05/2023   Sepsis with encephalopathy (HCC) 08/04/2023   Constipation 02/07/2023   S/P right hip fracture 02/05/2023   Hyperthyroidism 02/05/2023   Hypertension 02/05/2023   Chronic obstructive pulmonary disease (HCC) 02/05/2023   Primary osteoarthritis of left knee 09/30/2021   Anemia 10/11/2020   Carotid  artery stenosis 10/11/2020   Cerebral infarction due to unspecified occlusion or stenosis of left middle cerebral artery (HCC) 10/11/2020   Cervical spondylosis without myelopathy 10/11/2020   Chronic kidney disease, stage 1 10/11/2020   Decreased estrogen level 10/11/2020   Hardening of the aorta (main artery of the heart) (HCC) 10/11/2020   Hematuria 10/11/2020   History of carotid endarterectomy 10/11/2020   Impairment of balance 10/11/2020   Insomnia 10/11/2020    Major depression in complete remission (HCC) 10/11/2020   Chronic nephritic syndrome, focal and segmental glomerular lesions 10/11/2020   Non-toxic goiter 10/11/2020   Occlusion and stenosis of bilateral carotid arteries 10/11/2020   Overactive bladder 10/11/2020   Protein calorie malnutrition (HCC) 10/11/2020   Psoriasis 10/11/2020   Vitamin D deficiency 10/11/2020   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/29/2019   Bilateral knee pain 07/01/2019   Neck nodule 06/22/2019   COPD exacerbation (HCC) 09/02/2015   Asthma exacerbation 09/02/2015   Allergic rhinitis 03/16/2013   Nontoxic multinodular goiter 12/30/2010   ASTHMA 05/03/2009   HYPOKALEMIA 06/13/2008   Essential hypertension 04/30/2007   OSTEOPOROSIS 04/30/2007     Health Maintenance Due  Topic Date Due   COVID-19 Vaccine (1) Never done   DTaP/Tdap/Td (1 - Tdap) Never done   Zoster Vaccines- Shingrix (1 of 2) Never done   Pneumonia Vaccine 18+ Years old (2 of 2 - PPSV23 or PCV20) 10/11/2009     Review of Systems  Physical Exam   BP (!) 161/112   Pulse 79   Temp 97.9 F (36.6 C) (Oral)   Wt 127 lb (57.6 kg)   BMI 23.23 kg/m    No results found for: "CD4TCELL" No results found for: "CD4TABS" No results found for: "HIV1RNAQUANT" No results found for: "HEPBSAB" No results found for: "RPR", "LABRPR"  CBC Lab Results  Component Value Date   WBC 4.9 08/10/2023   RBC 3.31 (L) 08/10/2023   HGB 10.9 (L) 08/10/2023   HCT 33.5 (L) 08/10/2023   PLT 179 08/10/2023   MCV 101.2 (H) 08/10/2023   MCH 32.9 08/10/2023   MCHC 32.5 08/10/2023   RDW 15.8 (H) 08/10/2023   LYMPHSABS 1.1 08/10/2023   MONOABS 0.5 08/10/2023   EOSABS 0.0 08/10/2023    BMET Lab Results  Component Value Date   NA 141 08/10/2023   K 3.7 08/10/2023   CL 108 08/10/2023   CO2 25 08/10/2023   GLUCOSE 91 08/10/2023   BUN 15 08/10/2023   CREATININE 0.49 08/10/2023   CALCIUM 8.7 (L) 08/10/2023   GFRNONAA >60 08/10/2023   GFRAA >60  09/30/2019      Assessment and Plan

## 2023-10-13 LAB — BASIC METABOLIC PANEL
BUN/Creatinine Ratio: 36 (calc) — ABNORMAL HIGH (ref 6–22)
BUN: 20 mg/dL (ref 7–25)
CO2: 33 mmol/L — ABNORMAL HIGH (ref 20–32)
Calcium: 9.7 mg/dL (ref 8.6–10.4)
Chloride: 100 mmol/L (ref 98–110)
Creat: 0.56 mg/dL — ABNORMAL LOW (ref 0.60–0.95)
Glucose, Bld: 85 mg/dL (ref 65–99)
Potassium: 4.2 mmol/L (ref 3.5–5.3)
Sodium: 140 mmol/L (ref 135–146)

## 2023-10-13 LAB — SEDIMENTATION RATE: Sed Rate: 25 mm/h (ref 0–30)

## 2023-10-13 LAB — C-REACTIVE PROTEIN: CRP: <3 mg/L (ref <8.0)

## 2023-10-14 ENCOUNTER — Encounter: Payer: Self-pay | Admitting: "Endocrinology

## 2023-10-14 ENCOUNTER — Ambulatory Visit: Payer: Medicare PPO | Admitting: "Endocrinology

## 2023-10-14 VITALS — BP 138/82 | HR 82 | Ht 62.0 in | Wt 127.0 lb

## 2023-10-14 DIAGNOSIS — E0590 Thyrotoxicosis, unspecified without thyrotoxic crisis or storm: Secondary | ICD-10-CM

## 2023-10-14 DIAGNOSIS — E042 Nontoxic multinodular goiter: Secondary | ICD-10-CM

## 2023-10-14 MED ORDER — METHIMAZOLE 5 MG PO TABS: ORAL_TABLET | ORAL | 3 refills | Status: DC

## 2023-10-14 NOTE — Patient Instructions (Signed)
If you notice any symptoms of worsening fatigue, fever with sore throat, loss of appetite, yellowing of eyes, dark urine, joint pains, sores in the mouth, itchy rash, light colored stools or abdominal pain, please stop the medication and call us immediately as this can be a serious side effect of the medication.

## 2023-10-14 NOTE — Progress Notes (Signed)
Outpatient Endocrinology Note   Ariel Lambert July 07, 1936 [id number]  Referring Provider: Mila Palmer, MD Primary Care Provider: Mila Palmer, MD Subjective  Chief Complaint  Patient presents with   Medical Management of Chronic Issues    Assessment & Plan  Ariel Lambert was seen today for medical management of chronic issues.  Diagnoses and all orders for this visit:  Hyperthyroidism -     TSH -     T3, free -     T4, free  Multinodular goiter  Other orders -     methimazole (TAPAZOLE) 5 MG tablet; Alternate 5 mg one day and 10 mg the next day    Ariel Lambert is currently alternating between methimazole 10 mg and 5 mg. Patient currently clinically and biochemically euthyroid.  Educated on thyroid axis.  Recommend the following: continue current dose   Repeat labs in 3 months or sooner if symptoms of hyper or hypothyroidism develop.  -complications of untreated hyperthyroidism include atrial fibrillation, heart failure and osteoporosis -side effects of Methimazole include but are not limited to allergic reaction, rash, bone marrow suppression, liver dysfunction and teratogenic potential -to go urgent care if develops any fever and sore throat or any other complication for Methimazole and notify us -compliance and follow up needs    06/2019 thy ultrasound reported multinodular goiter, an indeterminate lesion or mass in the lateral right neck that measures up to 3.3 cm. This lesion appears to be separate from the thyroid tissue. Structure is indeterminate and recommend further characterization with a neck CT with IV contrast. Nodule #1 in the isthmus meets criteria for 1 year follow-up. Nodule #8 in the inferior left thyroid lobe meets criteria for follow-up but this is likely benign based on the images from 2005  07/2019 CT neck W contrast reported enlargement of the thyroid bilaterally. Numerous thyroid nodules bilaterally. 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 Confirmed 4 year stability of 1.9 cm TI-RADS category 3 nodule in the left lower gland. This lesion meets criteria for 1 additional follow-up ultrasound in October of 2025 to confirm 5 years of stability and thus benignity. Additional small scattered thyroid cysts and nodules are also noted but do not warrant further consideration. Recommend f/u thyroid U/S in 08/2024 I spent more than 50% of today's visit counseling patient on symptoms, examination findings, lab findings, imaging results, treatment decisions and monitoring and prognosis. The patient understood the recommendations and agrees with the treatment plan. All questions regarding treatment plan were fully answered   Return for visit + labs before next visit.   Ariel [place], MD    I have reviewed current medications, nurse's notes, allergies, vital signs, past medical and surgical history, family medical history, and social history for this encounter. Counseled patient on symptoms, examination findings, lab findings, imaging results, treatment decisions and monitoring and prognosis. The patient understood the recommendations and agrees with the treatment plan. All questions regarding treatment plan were fully answered.   History of Present Illness Ariel Lambert is a 88 y.o. year old female who presents to our clinic with hyperthyroidism and multinodular goiter diagnosed in 1999.  Alternates between methimazole 10 mg and 5 mg.  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."  Adverse Drug Effects from Methimazole (MMI): rash no fever no throat pain no arthritis no mouth ulcers no jaundice no loss of appetite no lymphadenopathy no  Grave's Ophthalmopathy Clinical Activity Score: 0/9  Symptoms suggestive of HYPOTHYROIDISM:  fatigue - weight gain - cold intolerance  - constipation  -  Symptoms suggestive of HYPERTHYROIDISM:  weight loss  - heat intolerance - hyperdefecation  - palpitations  -  Compressive symptoms:  dysphagia  - dysphonia  - positional dyspnea (especially with simultaneous arms elevation)  -  Smokes  - On biotin  + Personal history of head/neck surgery/irradiation  -  Physical Exam  BP 138/82   Pulse 82   Ht 5\' 2"  (1.575 m)   Wt 127 lb (57.6 kg)   SpO2 96%   BMI 23.23 kg/m  Constitutional: well developed, well nourished Head: normocephalic, atraumatic, no exophthalmos Eyes: sclera anicteric, no redness Neck: no thyromegaly, no thyroid tenderness; + nodules palpated Lungs: normal respiratory effort Neurology: alert and oriented, no tremor Skin: dry, no appreciable rashes Musculoskeletal: no appreciable defects Psychiatric: normal mood and affect  Allergies Allergies  Allergen Reactions   Cetirizine Other (See Comments)    Stomach pain   Codeine Itching    Severe itching   Hydrocodone Itching   Meperidine Hcl Diarrhea and Nausea And Vomiting    (Demerol)   Nabumetone Rash    (Relafen)   Naproxen Itching and Rash   Sulfa Antibiotics Rash   Sulfonamide Derivatives Rash    Current Medications Patient's Medications  New Prescriptions   No medications on file  Previous Medications   ACETAMINOPHEN (TYLENOL) 325 MG TABLET    Take 2 tablets (650 mg total) by mouth every 6 (six) hours as needed for fever, headache or mild pain.   ALBUTEROL (VENTOLIN HFA)  108 (90 BASE) MCG/ACT INHALER    Inhale 2 puffs into the lungs every 6 (six) hours as needed for wheezing or shortness of breath (Asthma).   ASPIRIN EC 81 MG TABLET    Take 81 mg by mouth daily. Swallow whole.   AZELASTINE (ASTELIN) 0.1 % NASAL SPRAY    Place 2 sprays into both nostrils every evening. Use in each nostril as directed   BIOTIN 5000 MCG CAPS    Take 5,000 mcg by mouth daily.   CALCIUM CARB-CHOLECALCIFEROL (CALCIUM 600+D3) 600-20 MG-MCG TABS    Take 1 tablet by mouth 2 (two) times daily.   CEPHALEXIN (KEFLEX) 500 MG CAPSULE    Take 1 capsule (500 mg total) by mouth 3 (three) times daily.   DIPHENHYDRAMINE (BENADRYL) 25 MG TABLET    Take 25 mg by mouth daily as needed for allergies.   DOXYCYCLINE (VIBRA-TABS) 100 MG TABLET    Take 1 tablet (100 mg total) by mouth 2 (two) times daily.   EZETIMIBE-SIMVASTATIN (VYTORIN) 10-20 MG TABLET    Take 1 tablet by mouth daily after breakfast.   HYDROCORTISONE CREAM 1 %    Apply 1 Application topically daily as needed (Ezema).   HYDROXYPROPYL METHYLCELLULOSE / HYPROMELLOSE (ISOPTO TEARS / GONIOVISC) 2.5 % OPHTHALMIC SOLUTION    Place 1 drop into both eyes daily.   IRBESARTAN (AVAPRO) 75 MG TABLET    Take 1 tablet (75 mg total) by mouth daily.  LEVALBUTEROL (XOPENEX) 0.63 MG/3ML NEBULIZER SOLUTION    Take 3 mLs (0.63 mg total) by nebulization every 6 (six) hours as needed for up to 120 doses for wheezing or shortness of breath.   MAGNESIUM GLUCONATE PO    Take 400 mg by mouth daily.   MIRTAZAPINE (REMERON SOL-TAB) 30 MG DISINTEGRATING TABLET    Take 1 tablet (30 mg total) by mouth at bedtime.   MONTELUKAST (SINGULAIR) 10 MG TABLET    Take 1 tablet (10 mg total) by mouth at bedtime.   MULTIPLE VITAMIN (MULTIVITAMIN WITH MINERALS) TABS TABLET    Take 1 tablet by mouth daily.   SENNA (SENOKOT) 8.6 MG TABS TABLET    Take 1 tablet (8.6 mg total) by mouth 2 (two) times daily.   SYMBICORT 160-4.5 MCG/ACT INHALER    Inhale 2 puffs into the lungs 2 (two)  times daily.  Modified Medications   Modified Medication Previous Medication   METHIMAZOLE (TAPAZOLE) 5 MG TABLET methimazole (TAPAZOLE) 10 MG tablet      Alternate 5 mg one day and 10 mg the next day    Take 0.5-1 tablets (5-10 mg total) by mouth See admin instructions. Alternate 5 mg one day and 10 mg the next day  Discontinued Medications   FLUTICASONE (FLONASE) 50 MCG/ACT NASAL SPRAY    Place 2 sprays into both nostrils daily as needed for allergies or rhinitis.   LORATADINE (ALLERGY RELIEF) 10 MG TABLET    Take 10 mg by mouth daily.   TRIAMCINOLONE OINTMENT (KENALOG) 0.1 %    Apply 1 Application topically daily as needed (unknown).    Past Medical History Past Medical History:  Diagnosis Date   Allergy    Arthritis    ASTHMA 05/03/2009   Asthma    Carotid stenosis    Chronic kidney disease    " in remission"   COPD (chronic obstructive pulmonary disease) (HCC)    CVA (cerebral infarction)    Dyslipidemia    Headache    Hearing aid worn    B/L   HYPERTENSION 04/30/2007   off of meds since  01/2023   HYPERTHYROIDISM 06/17/2007   Hyperthyroidism    Menopause    Multinodular goiter    OSTEOPOROSIS 04/30/2007   Pneumonia    Stroke [facility])    Surgical site infection 04/23/2023   Thyrotoxicosis 06/17/2007   Qualifier: Diagnosis of   By: Everardo All MD, Cleophas Dunker        Wears glasses     Past Surgical History Past Surgical History:  Procedure Laterality Date   BACK SURGERY     BREAST EXCISIONAL BIOPSY Left    carotid artery surgery     CATARACT EXTRACTION W/ INTRAOCULAR LENS  IMPLANT, BILATERAL     ENDARTERECTOMY Right 09/29/2019   Procedure: REDO OF RIGHT ENDARTERECTOMY CAROTID;  Surgeon: Maeola Harman, MD;  Location: [facility] OR;  Service: Vascular;  Laterality: Right;   INCISION AND DRAINAGE OF WOUND Right 04/25/2023   Procedure: IRRIGATION AND DEBRIDEMENT HIP WOUND; APPLICATION OF NEGATIVE PRESSURE WOUND VAC;  Surgeon: Samson Frederic, MD;  Location: [facility] ORS;  Service:  Orthopedics;  Laterality: Right;  220   NECK SURGERY     OTHER SURGICAL HISTORY     discectomy   TOTAL HIP ARTHROPLASTY Right 02/06/2023   Procedure: ANTERIOR TOTAL HIP ARTHROPLASTY;  Surgeon: Samson Frederic, MD;  Location: [facility] OR;  Service: Orthopedics;  Laterality: Right;   TOTAL KNEE ARTHROPLASTY Left 09/30/2021   Procedure: TOTAL KNEE ARTHROPLASTY;  Surgeon:  Ollen Gross, MD;  Location: [facility] ORS;  Service: Orthopedics;  Laterality: Left;   WISDOM TOOTH EXTRACTION      Family History family history includes Breast cancer (age of onset: 62) in her mother; Heart disease in an other family member.  Social History Social History   Socioeconomic History   Marital status: Widowed    Spouse name: Not on file   Number of children: Not on file   Years of education: Not on file   Highest education level: Not on file  Occupational History   Occupation: Teacher    Employer: RETIRED  Tobacco Use   Smoking status: Former    Types: Cigarettes   Smokeless tobacco: Never   Tobacco comments:    "Quit smoking cigarettes in my 40's "  Vaping Use   Vaping status: Never Used  Substance and Sexual Activity   Alcohol use: Yes    Alcohol/week: 0.0 standard drinks of alcohol    Comment: Socially   Drug use: No   Sexual activity: Not on file  Other Topics Concern   Not on file  Social History Narrative   Lives alone.   Social Drivers of Corporate investment banker Strain: Not on file  Food Insecurity: No Food Insecurity (08/04/2023)   Hunger Vital Sign    Worried About Running Out of Food in the Last Year: Never true    Ran Out of Food in the Last Year: Never true  Transportation Needs: No Transportation Needs (08/04/2023)   PRAPARE - Administrator, Civil Service (Medical): No    Lack of Transportation (Non-Medical): No  Physical Activity: Not on file  Stress: Not on file  Social Connections: Not on file  Intimate Partner Violence: Not At Risk (08/04/2023)   Humiliation,  Afraid, Rape, and Kick questionnaire    Fear of Current or Ex-Partner: No    Emotionally Abused: No    Physically Abused: No    Sexually Abused: No    Laboratory Investigations Lab Results  Component Value Date   TSH 1.14 10/07/2023   FREET4 1.2 10/07/2023     Lab Results  Component Value Date   TSI <89 01/12/2023     Lab Results  Component Value Date   CREATININE 0.56 (L) 10/12/2023   No results found for: "GFR"    Component Value Date/Time   NA 140 10/12/2023 1125   K 4.2 10/12/2023 1125   CL 100 10/12/2023 1125   CO2 33 (H) 10/12/2023 1125   GLUCOSE 85 10/12/2023 1125   BUN 20 10/12/2023 1125   CREATININE 0.56 (L) 10/12/2023 1125   CALCIUM 9.7 10/12/2023 1125   PROT 5.9 (L) 08/10/2023 0336   ALBUMIN 2.8 (L) 08/10/2023 0336   AST 78 (H) 08/10/2023 0336   ALT 119 (H) 08/10/2023 0336   ALKPHOS 70 08/10/2023 0336   BILITOT 0.6 08/10/2023 0336   GFRNONAA >60 08/10/2023 0336   GFRAA >60 09/30/2019 0430      Latest Ref Rng & Units 10/12/2023   11:25 AM 08/10/2023    3:36 AM 08/08/2023    3:17 AM  BMP  Glucose 65 - 99 mg/dL 85  91  90   BUN 7 - 25 mg/dL 20  15  16    Creatinine 0.60 - 0.95 mg/dL 1.91  4.78  2.95   BUN/Creat Ratio 6 - 22 (calc) 36     Sodium 135 - 146 mmol/L 140  141  139   Potassium 3.5 - 5.3 mmol/L  4.2  3.7  4.1   Chloride 98 - 110 mmol/L 100  108  105   CO2 20 - 32 mmol/L 33  25  26   Calcium 8.6 - 10.4 mg/dL 9.7  8.7  8.6        Component Value Date/Time   WBC 4.9 08/10/2023 0336   RBC 3.31 (L) 08/10/2023 0336   HGB 10.9 (L) 08/10/2023 0336   HCT 33.5 (L) 08/10/2023 0336   PLT 179 08/10/2023 0336   MCV 101.2 (H) 08/10/2023 0336   MCH 32.9 08/10/2023 0336   MCHC 32.5 08/10/2023 0336   RDW 15.8 (H) 08/10/2023 0336   LYMPHSABS 1.1 08/10/2023 0336   MONOABS 0.5 08/10/2023 0336   EOSABS 0.0 08/10/2023 0336   BASOSABS 0.0 08/10/2023 0336      Parts of this note may have been dictated using voice recognition software. There may be  variances in spelling and vocabulary which are unintentional. Not all errors are proofread. Please notify the Thereasa Parkin if any discrepancies are noted or if the meaning of any statement is not clear.

## 2023-10-15 DIAGNOSIS — Z8781 Personal history of (healed) traumatic fracture: Secondary | ICD-10-CM | POA: Diagnosis not present

## 2023-10-15 DIAGNOSIS — M810 Age-related osteoporosis without current pathological fracture: Secondary | ICD-10-CM | POA: Diagnosis not present

## 2023-10-15 DIAGNOSIS — Z9181 History of falling: Secondary | ICD-10-CM | POA: Diagnosis not present

## 2023-10-19 ENCOUNTER — Ambulatory Visit (HOSPITAL_COMMUNITY): Payer: Medicare PPO

## 2023-10-21 DIAGNOSIS — M810 Age-related osteoporosis without current pathological fracture: Secondary | ICD-10-CM | POA: Diagnosis not present

## 2023-10-27 ENCOUNTER — Other Ambulatory Visit: Payer: Self-pay | Admitting: "Endocrinology

## 2023-10-29 ENCOUNTER — Ambulatory Visit (HOSPITAL_COMMUNITY)
Admission: RE | Admit: 2023-10-29 | Discharge: 2023-10-29 | Disposition: A | Discharge status: Home / Self Care | Payer: Medicare PPO | Source: Ambulatory Visit | Attending: Internal Medicine | Admitting: Internal Medicine

## 2023-10-29 DIAGNOSIS — M4187 Other forms of scoliosis, lumbosacral region: Secondary | ICD-10-CM | POA: Diagnosis not present

## 2023-10-29 DIAGNOSIS — M4646 Discitis, unspecified, lumbar region: Secondary | ICD-10-CM | POA: Diagnosis not present

## 2023-10-29 DIAGNOSIS — M4319 Spondylolisthesis, multiple sites in spine: Secondary | ICD-10-CM | POA: Diagnosis not present

## 2023-10-29 DIAGNOSIS — M48061 Spinal stenosis, lumbar region without neurogenic claudication: Secondary | ICD-10-CM | POA: Diagnosis not present

## 2023-10-29 DIAGNOSIS — M47816 Spondylosis without myelopathy or radiculopathy, lumbar region: Secondary | ICD-10-CM | POA: Diagnosis not present

## 2023-10-29 MED ORDER — GADOBUTROL 1 MMOL/ML IV SOLN
6.0000 mL | Freq: Once | INTRAVENOUS | Status: AC | PRN
  Administered 2023-10-29: 6 mL via INTRAVENOUS

## 2023-11-03 DIAGNOSIS — Z9181 History of falling: Secondary | ICD-10-CM | POA: Diagnosis not present

## 2023-11-03 DIAGNOSIS — R748 Abnormal levels of other serum enzymes: Secondary | ICD-10-CM | POA: Diagnosis not present

## 2023-11-03 DIAGNOSIS — Z8673 Personal history of transient ischemic attack (TIA), and cerebral infarction without residual deficits: Secondary | ICD-10-CM | POA: Diagnosis not present

## 2023-11-03 DIAGNOSIS — Z8781 Personal history of (healed) traumatic fracture: Secondary | ICD-10-CM | POA: Diagnosis not present

## 2023-11-03 DIAGNOSIS — M810 Age-related osteoporosis without current pathological fracture: Secondary | ICD-10-CM | POA: Diagnosis not present

## 2023-11-10 DIAGNOSIS — M4646 Discitis, unspecified, lumbar region: Secondary | ICD-10-CM | POA: Diagnosis not present

## 2023-11-11 ENCOUNTER — Other Ambulatory Visit: Payer: Self-pay | Admitting: Internal Medicine

## 2023-11-11 ENCOUNTER — Encounter: Payer: Self-pay | Admitting: Internal Medicine

## 2023-11-11 ENCOUNTER — Telehealth: Payer: Self-pay

## 2023-11-11 DIAGNOSIS — M810 Age-related osteoporosis without current pathological fracture: Secondary | ICD-10-CM | POA: Insufficient documentation

## 2023-11-11 NOTE — Telephone Encounter (Signed)
 Dr. Sharl Ma, patient will be scheduled as soon as possible.  Auth Submission: NO AUTH NEEDED Site of care: Site of care: [facility] Payer: [employer] medicare Medication & CPT/J Code(s) submitted: Reclast (Zolendronic acid) [id number] Route of submission (phone, fax, portal):  Phone # Fax # Auth type: Buy/Bill PB Units/visits requested: 5mg  x 1 dose Reference number:  Approval from: 11/11/23 to 09/14/24

## 2023-11-12 DIAGNOSIS — J449 Chronic obstructive pulmonary disease, unspecified: Secondary | ICD-10-CM | POA: Diagnosis not present

## 2023-11-12 DIAGNOSIS — J3089 Other allergic rhinitis: Secondary | ICD-10-CM | POA: Diagnosis not present

## 2023-11-12 DIAGNOSIS — L309 Dermatitis, unspecified: Secondary | ICD-10-CM | POA: Diagnosis not present

## 2023-11-12 DIAGNOSIS — J301 Allergic rhinitis due to pollen: Secondary | ICD-10-CM | POA: Diagnosis not present

## 2023-11-16 ENCOUNTER — Ambulatory Visit: Payer: Medicare PPO | Admitting: "Endocrinology

## 2023-11-24 ENCOUNTER — Ambulatory Visit: Payer: Medicare PPO | Admitting: *Deleted

## 2023-11-24 ENCOUNTER — Encounter: Payer: Self-pay | Admitting: Internal Medicine

## 2023-11-24 VITALS — BP 155/87 | HR 65 | Temp 97.6°F | Resp 16 | Ht 62.0 in | Wt 129.4 lb

## 2023-11-24 DIAGNOSIS — M810 Age-related osteoporosis without current pathological fracture: Secondary | ICD-10-CM | POA: Diagnosis not present

## 2023-11-24 MED ORDER — DIPHENHYDRAMINE HCL 25 MG PO CAPS
25.0000 mg | ORAL_CAPSULE | Freq: Once | ORAL | Status: AC
  Administered 2023-11-24: 25 mg via ORAL
  Filled 2023-11-24: qty 1

## 2023-11-24 MED ORDER — ZOLEDRONIC ACID 5 MG/100ML IV SOLN
5.0000 mg | Freq: Once | INTRAVENOUS | Status: AC
  Administered 2023-11-24: 5 mg via INTRAVENOUS
  Filled 2023-11-24: qty 100

## 2023-11-24 MED ORDER — ACETAMINOPHEN 325 MG PO TABS
650.0000 mg | ORAL_TABLET | Freq: Once | ORAL | Status: AC
  Administered 2023-11-24: 650 mg via ORAL
  Filled 2023-11-24: qty 2

## 2023-11-24 NOTE — Progress Notes (Signed)
 Diagnosis: Osteoporosis  Provider:  Chilton Greathouse MD  Procedure: IV Infusion  IV Type: Peripheral, IV Location: R Hand  Reclast (Zolendronic Acid), Dose: 5 mg  Infusion Start Time: 1536 pm  Infusion Stop Time: 1610 pm  Post Infusion IV Care: Observation period completed  Discharge: Condition: Good, Destination: Home . AVS Provided  Performed by:  Forrest Moron, RN

## 2023-11-24 NOTE — Patient Instructions (Signed)

## 2023-11-26 ENCOUNTER — Ambulatory Visit: Payer: Medicare PPO | Admitting: Internal Medicine

## 2023-11-26 ENCOUNTER — Other Ambulatory Visit: Payer: Self-pay

## 2023-11-26 ENCOUNTER — Encounter: Payer: Self-pay | Admitting: Internal Medicine

## 2023-11-26 VITALS — BP 108/74 | HR 89 | Resp 16 | Ht 62.0 in | Wt 123.0 lb

## 2023-11-26 DIAGNOSIS — M4646 Discitis, unspecified, lumbar region: Secondary | ICD-10-CM

## 2023-11-26 NOTE — Progress Notes (Signed)
 Patient ID: Ariel Lambert, female   DOB: July 21, 1936, 88 y.o.   MRN: [id number]  HPI   Ariel Lambert is an 88yo F with culture negative discitis-osteomyelitis of [id number]_S1 where she was on 6-7 wk of iv daptomycin and ceftriaxone through early January then transitioned to doxycycline and cephalexin--- finished at end of February. She has been off of abtx and feeling good. No recurrence of back pain. No diarrhea. No thrush. Overall eating well. Fair mobilitiy. At her baseline     Lab Results  Component Value Date   ESRSEDRATE 25 10/12/2023     Outpatient Encounter Medications as of 11/26/2023  Medication Sig   acetaminophen (TYLENOL) 325 MG tablet Take 2 tablets (650 mg total) by mouth every 6 (six) hours as needed for fever, headache or mild pain.   aspirin EC 81 MG tablet Take 81 mg by mouth daily. Swallow whole.   azelastine (ASTELIN) 0.1 % nasal spray Place 2 sprays into both nostrils every evening. Use in each nostril as directed   Biotin 5000 MCG CAPS Take 5,000 mcg by mouth daily.   Calcium Carb-Cholecalciferol (CALCIUM 600+D3) 600-20 MG-MCG TABS Take 1 tablet by mouth 2 (two) times daily.   diphenhydrAMINE (BENADRYL) 25 MG tablet Take 25 mg by mouth daily as needed for allergies.   fexofenadine (ALLEGRA) 180 MG tablet 1 tablet Swallow whole with water; do not take with fruit juices. Orally Once a day for 30 day(s)   hydrocortisone cream 1 % Apply 1 Application topically daily as needed (Ezema).   hydroxypropyl methylcellulose / hypromellose (ISOPTO TEARS / GONIOVISC) 2.5 % ophthalmic solution Place 1 drop into both eyes daily.   levalbuterol (XOPENEX) 0.63 MG/3ML nebulizer solution Take 3 mLs (0.63 mg total) by nebulization every 6 (six) hours as needed for up to 120 doses for wheezing or shortness of breath.   MAGNESIUM GLUCONATE PO Take 400 mg by mouth daily.   methimazole (TAPAZOLE) 5 MG tablet Alternate 5 mg one day and 10 mg the next day   Multiple Vitamin  (MULTIVITAMIN WITH MINERALS) TABS tablet Take 1 tablet by mouth daily.   albuterol (VENTOLIN HFA) 108 (90 Base) MCG/ACT inhaler Inhale 2 puffs into the lungs every 6 (six) hours as needed for wheezing or shortness of breath (Asthma).   cephALEXin (KEFLEX) 500 MG capsule Take 1 capsule (500 mg total) by mouth 3 (three) times daily. (Patient not taking: Reported on 11/26/2023)   doxycycline (VIBRA-TABS) 100 MG tablet Take 1 tablet (100 mg total) by mouth 2 (two) times daily. (Patient not taking: Reported on 11/26/2023)   ezetimibe-simvastatin (VYTORIN) 10-20 MG tablet Take 1 tablet by mouth daily after breakfast.   irbesartan (AVAPRO) 75 MG tablet Take 1 tablet (75 mg total) by mouth daily.   mirtazapine (REMERON SOL-TAB) 30 MG disintegrating tablet Take 1 tablet (30 mg total) by mouth at bedtime.   montelukast (SINGULAIR) 10 MG tablet Take 1 tablet (10 mg total) by mouth at bedtime.   SYMBICORT 160-4.5 MCG/ACT inhaler Inhale 2 puffs into the lungs 2 (two) times daily.   No facility-administered encounter medications on file as of 11/26/2023.     Patient Active Problem List   Diagnosis Date Noted   Senile osteoporosis 11/11/2023   Lumbar discitis 08/05/2023   Hyponatremia 08/05/2023   Sepsis with encephalopathy (HCC) 08/04/2023   Constipation 02/07/2023   S/P right hip fracture 02/05/2023   Hyperthyroidism 02/05/2023   Hypertension 02/05/2023   Chronic obstructive pulmonary disease (HCC) 02/05/2023   Primary osteoarthritis  of left knee 09/30/2021   Anemia 10/11/2020   Carotid artery stenosis 10/11/2020   Cerebral infarction due to unspecified occlusion or stenosis of left middle cerebral artery (HCC) 10/11/2020   Cervical spondylosis without myelopathy 10/11/2020   Chronic kidney disease, stage 1 10/11/2020   Decreased estrogen level 10/11/2020   Hardening of the aorta (main artery of the heart) (HCC) 10/11/2020   Hematuria 10/11/2020   History of carotid endarterectomy 10/11/2020    Impairment of balance 10/11/2020   Insomnia 10/11/2020   Major depression in complete remission (HCC) 10/11/2020   Chronic nephritic syndrome, focal and segmental glomerular lesions 10/11/2020   Non-toxic goiter 10/11/2020   Occlusion and stenosis of bilateral carotid arteries 10/11/2020   Overactive bladder 10/11/2020   Protein calorie malnutrition (HCC) 10/11/2020   Psoriasis 10/11/2020   Vitamin D deficiency 10/11/2020   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/29/2019   Bilateral knee pain 07/01/2019   Neck nodule 06/22/2019   COPD exacerbation (HCC) 09/02/2015   Asthma exacerbation 09/02/2015   Allergic rhinitis 03/16/2013   Nontoxic multinodular goiter 12/30/2010   ASTHMA 05/03/2009   HYPOKALEMIA 06/13/2008   Essential hypertension 04/30/2007   OSTEOPOROSIS 04/30/2007     Health Maintenance Due  Topic Date Due   COVID-19 Vaccine (1) Never done   DTaP/Tdap/Td (1 - Tdap) Never done   Pneumonia Vaccine 80+ Years old (2 of 2 - PPSV23 or PCV20) 10/11/2009   Medicare Annual Wellness (AWV)  01/21/2024     Review of Systems 12 point ros is negative  Physical Exam   BP 108/74   Pulse 89   Resp 16   Ht 5\' 2"  (1.575 m)   Wt 123 lb (55.8 kg)   BMI 22.50 kg/m   Physical Exam  Constitutional:  oriented to person, place, and time. appears well-developed and well-nourished. No distress.  HENT: [place]/AT, PERRLA, no scleral icterus Mouth/Throat: Oropharynx is clear and moist. No oropharyngeal exudate.  Cardiovascular: Normal rate, regular rhythm and normal heart sounds. Exam reveals no gallop and no friction rub.  No murmur heard.  Pulmonary/Chest: Effort normal and breath sounds normal. No respiratory distress.  has no wheezes.  Neck = supple, no nuchal rigidity Abdominal: Soft. Bowel sounds are normal.  exhibits no distension. There is no tenderness.  Lymphadenopathy: no cervical adenopathy. No axillary adenopathy Neurological: alert and oriented to person, place, and time.   Skin: Skin is warm and dry. No rash noted. No erythema.  Psychiatric: a normal mood and affect.  behavior is normal.    CBC Lab Results  Component Value Date   WBC 4.9 08/10/2023   RBC 3.31 (L) 08/10/2023   HGB 10.9 (L) 08/10/2023   HCT 33.5 (L) 08/10/2023   PLT 179 08/10/2023   MCV 101.2 (H) 08/10/2023   MCH 32.9 08/10/2023   MCHC 32.5 08/10/2023   RDW 15.8 (H) 08/10/2023   LYMPHSABS 1.1 08/10/2023   MONOABS 0.5 08/10/2023   EOSABS 0.0 08/10/2023    BMET Lab Results  Component Value Date   NA 140 10/12/2023   K 4.2 10/12/2023   CL 100 10/12/2023   CO2 33 (H) 10/12/2023   GLUCOSE 85 10/12/2023   BUN 20 10/12/2023   CREATININE 0.56 (L) 10/12/2023   CALCIUM 9.7 10/12/2023   GFRNONAA >60 08/10/2023   GFRAA >60 09/30/2019    Lab Results  Component Value Date   ESRSEDRATE 25 10/12/2023     Assessment and Plan  Lumbar discitis = has completed course and doing well off  of abtx. No complaints of back pain. Reviewed imaging with patient that showed no discitis, only degenerative disease change. No further abtx needed  Come on back when needed

## 2023-12-02 DIAGNOSIS — R350 Frequency of micturition: Secondary | ICD-10-CM | POA: Diagnosis not present

## 2023-12-02 DIAGNOSIS — N3946 Mixed incontinence: Secondary | ICD-10-CM | POA: Diagnosis not present

## 2023-12-08 DIAGNOSIS — M4646 Discitis, unspecified, lumbar region: Secondary | ICD-10-CM | POA: Diagnosis not present

## 2023-12-22 ENCOUNTER — Other Ambulatory Visit: Payer: Self-pay

## 2023-12-30 DIAGNOSIS — Z8709 Personal history of other diseases of the respiratory system: Secondary | ICD-10-CM | POA: Diagnosis not present

## 2023-12-30 DIAGNOSIS — R051 Acute cough: Secondary | ICD-10-CM | POA: Diagnosis not present

## 2023-12-30 DIAGNOSIS — R062 Wheezing: Secondary | ICD-10-CM | POA: Diagnosis not present

## 2024-01-07 DIAGNOSIS — R350 Frequency of micturition: Secondary | ICD-10-CM | POA: Diagnosis not present

## 2024-01-07 DIAGNOSIS — N3946 Mixed incontinence: Secondary | ICD-10-CM | POA: Diagnosis not present

## 2024-01-08 DIAGNOSIS — M4646 Discitis, unspecified, lumbar region: Secondary | ICD-10-CM | POA: Diagnosis not present

## 2024-01-12 ENCOUNTER — Other Ambulatory Visit: Payer: Medicare PPO

## 2024-01-14 ENCOUNTER — Ambulatory Visit: Payer: Medicare PPO | Admitting: "Endocrinology

## 2024-01-14 DIAGNOSIS — R350 Frequency of micturition: Secondary | ICD-10-CM | POA: Diagnosis not present

## 2024-01-21 DIAGNOSIS — R350 Frequency of micturition: Secondary | ICD-10-CM | POA: Diagnosis not present

## 2024-01-28 DIAGNOSIS — R350 Frequency of micturition: Secondary | ICD-10-CM | POA: Diagnosis not present

## 2024-02-02 DIAGNOSIS — H5203 Hypermetropia, bilateral: Secondary | ICD-10-CM | POA: Diagnosis not present

## 2024-02-02 DIAGNOSIS — Z961 Presence of intraocular lens: Secondary | ICD-10-CM | POA: Diagnosis not present

## 2024-02-04 DIAGNOSIS — R350 Frequency of micturition: Secondary | ICD-10-CM | POA: Diagnosis not present

## 2024-02-04 DIAGNOSIS — N3946 Mixed incontinence: Secondary | ICD-10-CM | POA: Diagnosis not present

## 2024-02-07 DIAGNOSIS — M4646 Discitis, unspecified, lumbar region: Secondary | ICD-10-CM | POA: Diagnosis not present

## 2024-02-11 DIAGNOSIS — R350 Frequency of micturition: Secondary | ICD-10-CM | POA: Diagnosis not present

## 2024-02-16 ENCOUNTER — Encounter: Payer: Self-pay | Admitting: Internal Medicine

## 2024-02-16 ENCOUNTER — Other Ambulatory Visit

## 2024-02-16 DIAGNOSIS — E0590 Thyrotoxicosis, unspecified without thyrotoxic crisis or storm: Secondary | ICD-10-CM | POA: Diagnosis not present

## 2024-02-16 LAB — T3, FREE: T3, Free: 3.3 pg/mL (ref 2.3–4.2)

## 2024-02-16 LAB — T4, FREE: Free T4: 1.3 ng/dL (ref 0.8–1.8)

## 2024-02-16 LAB — TSH: TSH: 1.62 m[IU]/L (ref 0.40–4.50)

## 2024-02-17 ENCOUNTER — Other Ambulatory Visit

## 2024-02-17 DIAGNOSIS — Z0000 Encounter for general adult medical examination without abnormal findings: Secondary | ICD-10-CM | POA: Diagnosis not present

## 2024-02-17 DIAGNOSIS — E538 Deficiency of other specified B group vitamins: Secondary | ICD-10-CM | POA: Diagnosis not present

## 2024-02-17 DIAGNOSIS — I700 Atherosclerosis of aorta: Secondary | ICD-10-CM | POA: Diagnosis not present

## 2024-02-17 DIAGNOSIS — I10 Essential (primary) hypertension: Secondary | ICD-10-CM | POA: Diagnosis not present

## 2024-02-17 DIAGNOSIS — Z9889 Other specified postprocedural states: Secondary | ICD-10-CM | POA: Diagnosis not present

## 2024-02-17 DIAGNOSIS — R7301 Impaired fasting glucose: Secondary | ICD-10-CM | POA: Diagnosis not present

## 2024-02-17 DIAGNOSIS — I6529 Occlusion and stenosis of unspecified carotid artery: Secondary | ICD-10-CM | POA: Diagnosis not present

## 2024-02-17 DIAGNOSIS — N031 Chronic nephritic syndrome with focal and segmental glomerular lesions: Secondary | ICD-10-CM | POA: Diagnosis not present

## 2024-02-17 DIAGNOSIS — Z79899 Other long term (current) drug therapy: Secondary | ICD-10-CM | POA: Diagnosis not present

## 2024-02-18 DIAGNOSIS — R350 Frequency of micturition: Secondary | ICD-10-CM | POA: Diagnosis not present

## 2024-02-23 ENCOUNTER — Ambulatory Visit: Admitting: "Endocrinology

## 2024-02-23 ENCOUNTER — Encounter: Payer: Self-pay | Admitting: "Endocrinology

## 2024-02-23 VITALS — BP 104/80 | HR 86 | Ht 62.0 in | Wt 128.0 lb

## 2024-02-23 DIAGNOSIS — E0590 Thyrotoxicosis, unspecified without thyrotoxic crisis or storm: Secondary | ICD-10-CM

## 2024-02-23 DIAGNOSIS — E042 Nontoxic multinodular goiter: Secondary | ICD-10-CM | POA: Diagnosis not present

## 2024-02-23 MED ORDER — METHIMAZOLE 5 MG PO TABS: ORAL_TABLET | ORAL | 3 refills | Status: AC

## 2024-02-23 NOTE — Progress Notes (Signed)
 Outpatient Endocrinology Note   Ariel Lambert 30-Dec-1935 [id number]  Referring Provider: Olin Bertin, MD Primary Care Provider: Olin Bertin, MD Subjective  No chief complaint on file.   Assessment & Plan  Diagnoses and all orders for this visit:  Hyperthyroidism -     TSH -     T4, free -     T3, free  Multinodular goiter  Other orders -     methimazole  (TAPAZOLE ) 5 MG tablet; Alternate 5 mg one day and 10 mg the next day    Meda Spies is currently alternating between methimazole  86 mg and 5 mg. TRAb -ve, TSI -ve Patient currently clinically and biochemically euthyroid.  Educated on thyroid  axis.  Recommend the following: continue current dose   Repeat labs in 3 months or sooner if symptoms of hyper or hypothyroidism develop.  -complications of untreated hyperthyroidism include atrial fibrillation, heart failure and osteoporosis -side effects of Methimazole  include but are not limited to allergic reaction, rash, bone marrow suppression, liver dysfunction and teratogenic potential -to go urgent care if develops any fever and sore throat or any other complication for Methimazole  and notify us  -compliance and follow up needs    06/2019 thy ultrasound reported multinodular goiter, an indeterminate lesion or mass in the lateral right neck that measures up to 3.3 cm. This lesion appears to be separate from the thyroid  tissue. Structure is indeterminate and recommend further characterization with a neck CT with IV contrast. Nodule #1 in the isthmus meets criteria for 1 year follow-up. Nodule #8 in the inferior left thyroid  lobe meets criteria for follow-up but this is likely benign based on the images from 2005  07/2019 CT neck W contrast reported enlargement of the thyroid  bilaterally. Numerous thyroid  nodules bilaterally. 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 Confirmed 4 year stability of 1.9 cm TI-RADS category 3 nodule in the left lower gland. This lesion meets criteria for 1 additional follow-up ultrasound in October of 2025 to confirm 5 years of stability and thus benignity. Additional small scattered thyroid  cysts and nodules are also noted but do not warrant further consideration. Recommend f/u thyroid  U/S in 08/2024 I spent more than 50% of today's visit counseling patient on symptoms, examination findings, lab findings, imaging results, treatment decisions and monitoring and prognosis. The patient understood the recommendations and agrees with the treatment plan. All questions regarding treatment plan were fully answered   Return in about 5 months (around 07/25/2024) for visit + labs before next visit.   Jorge Newcomer, MD    I have reviewed current medications, nurse's notes, allergies, vital signs, past medical and surgical history, family medical history, and social history for this encounter. Counseled patient on symptoms, examination findings, lab findings, imaging results, treatment decisions and monitoring and prognosis. The patient understood the recommendations and agrees with the treatment plan. All questions regarding treatment plan were fully answered.   History of Present Illness Ariel Lambert is a 88 y.o. year old female who presents to our clinic with hyperthyroidism and multinodular goiter diagnosed in 1999.  Alternates between methimazole  10 mg and 5 mg.  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."  Adverse Drug Effects from Methimazole  (MMI): rash no fever no throat pain no arthritis no mouth ulcers no jaundice no loss of appetite no lymphadenopathy no  Grave's Ophthalmopathy Clinical Activity Score: 0/9, dry eyes   Symptoms suggestive of HYPOTHYROIDISM:  fatigue - weight gain +- few lbs cold intolerance  - constipation  -  Symptoms suggestive of HYPERTHYROIDISM:  weight loss  - heat intolerance - hyperdefecation  - palpitations  -  Compressive symptoms:  dysphagia  - dysphonia  - positional dyspnea (especially with simultaneous arms elevation)  -  Smokes  - On biotin  + Personal history of head/neck surgery/irradiation  -  Physical Exam  BP 104/80   Pulse 86   Ht 5\' 2"  (1.575 m)   Wt 128 lb (58.1 kg)   SpO2 96%   BMI 23.41 kg/m  Constitutional: well developed, well nourished Head: normocephalic, atraumatic, no exophthalmos Eyes: sclera anicteric, no redness Neck: no thyromegaly, no thyroid  tenderness; + nodules palpated Lungs: normal respiratory effort Neurology: alert and oriented, no tremor Skin: dry, no appreciable rashes Musculoskeletal: no appreciable defects Psychiatric: normal mood and affect  Allergies Allergies  Allergen Reactions   Cetirizine Other (See Comments)    Stomach pain   Codeine Itching    Severe itching   Hydrocodone  Itching   Meperidine Hcl Diarrhea and Nausea And Vomiting    (Demerol)   Nabumetone Rash    (Relafen)   Naproxen Itching and Rash   Sulfa Antibiotics Rash   Sulfonamide Derivatives Rash    Current Medications Patient's Medications  New Prescriptions   No medications on file  Previous Medications   ACETAMINOPHEN  (TYLENOL ) 325 MG TABLET    Take 2 tablets (650 mg total) by mouth every 6 (six) hours as needed for fever, headache or mild pain.   ALBUTEROL  (VENTOLIN  HFA) 108 (90 BASE) MCG/ACT INHALER    Inhale 2 puffs  into the lungs every 6 (six) hours as needed for wheezing or shortness of breath (Asthma).   ASPIRIN  EC 81 MG TABLET    Take 81 mg by mouth daily. Swallow whole.   AZELASTINE  (ASTELIN ) 0.1 % NASAL SPRAY    Place 2 sprays into both nostrils every evening. Use in each nostril as directed   BIOTIN 5000 MCG CAPS    Take 5,000 mcg by mouth daily.   CALCIUM  CARB-CHOLECALCIFEROL (CALCIUM  600+D3) 600-20 MG-MCG TABS    Take 1 tablet by mouth 2 (two) times daily.   CEPHALEXIN  (KEFLEX ) 500 MG CAPSULE    Take 1 capsule (500 mg total) by mouth 3 (three) times daily.   DIPHENHYDRAMINE  (BENADRYL ) 25 MG TABLET    Take 25 mg by mouth daily as needed for allergies.   DOXYCYCLINE  (VIBRA -TABS) 100 MG TABLET    Take 1 tablet (100 mg total) by mouth 2 (two) times daily.   EZETIMIBE -SIMVASTATIN  (VYTORIN ) 10-20 MG TABLET    Take 1 tablet by mouth daily after breakfast.   FEXOFENADINE (ALLEGRA) 180 MG TABLET    1 tablet Swallow whole with water ; do not take with fruit juices. Orally Once a day for 30 day(s)   HYDROCORTISONE CREAM 1 %    Apply 1 Application topically daily as needed (Ezema).   HYDROXYPROPYL METHYLCELLULOSE / HYPROMELLOSE (ISOPTO TEARS / GONIOVISC) 2.5 % OPHTHALMIC SOLUTION    Place 1 drop  into both eyes daily.   IRBESARTAN  (AVAPRO ) 75 MG TABLET    Take 1 tablet (75 mg total) by mouth daily.   LEVALBUTEROL  (XOPENEX ) 0.63 MG/3ML NEBULIZER SOLUTION    Take 3 mLs (0.63 mg total) by nebulization every 6 (six) hours as needed for up to 120 doses for wheezing or shortness of breath.   MAGNESIUM  GLUCONATE PO    Take 400 mg by mouth daily.   MIRTAZAPINE  (REMERON  SOL-TAB) 30 MG DISINTEGRATING TABLET    Take 1 tablet (30 mg total) by mouth at bedtime.   MONTELUKAST  (SINGULAIR ) 10 MG TABLET    Take 1 tablet (10 mg total) by mouth at bedtime.   MULTIPLE VITAMIN (MULTIVITAMIN WITH MINERALS) TABS TABLET    Take 1 tablet by mouth daily.   SYMBICORT  160-4.5 MCG/ACT INHALER    Inhale 2 puffs into the lungs 2 (two) times  daily.  Modified Medications   Modified Medication Previous Medication   METHIMAZOLE  (TAPAZOLE ) 5 MG TABLET methimazole  (TAPAZOLE ) 5 MG tablet      Alternate 5 mg one day and 10 mg the next day    Alternate 5 mg one day and 10 mg the next day  Discontinued Medications   No medications on file    Past Medical History Past Medical History:  Diagnosis Date   Allergy    Arthritis    ASTHMA 05/03/2009   Asthma    Carotid stenosis    Chronic kidney disease    " in remission"   COPD (chronic obstructive pulmonary disease) (HCC)    CVA (cerebral infarction)    Dyslipidemia    Headache    Hearing aid worn    B/L   HYPERTENSION 04/30/2007   off of meds since  01/2023   HYPERTHYROIDISM 06/17/2007   Hyperthyroidism    Menopause    Multinodular goiter    OSTEOPOROSIS 04/30/2007   Pneumonia    Stroke [facility])    Surgical site infection 04/23/2023   Thyrotoxicosis 06/17/2007   Qualifier: Diagnosis of   By: Washington Hacker MD, Leota Randy        Wears glasses     Past Surgical History Past Surgical History:  Procedure Laterality Date   BACK SURGERY     BREAST EXCISIONAL BIOPSY Left    carotid artery surgery     CATARACT EXTRACTION W/ INTRAOCULAR LENS  IMPLANT, BILATERAL     ENDARTERECTOMY Right 09/29/2019   Procedure: REDO OF RIGHT ENDARTERECTOMY CAROTID;  Surgeon: Adine Hoof, MD;  Location: [facility] OR;  Service: Vascular;  Laterality: Right;   INCISION AND DRAINAGE OF WOUND Right 04/25/2023   Procedure: IRRIGATION AND DEBRIDEMENT HIP WOUND; APPLICATION OF NEGATIVE PRESSURE WOUND VAC;  Surgeon: Adonica Hoose, MD;  Location: [facility] ORS;  Service: Orthopedics;  Laterality: Right;  220   NECK SURGERY     OTHER SURGICAL HISTORY     discectomy   TOTAL HIP ARTHROPLASTY Right 02/06/2023   Procedure: ANTERIOR TOTAL HIP ARTHROPLASTY;  Surgeon: Adonica Hoose, MD;  Location: [facility] OR;  Service: Orthopedics;  Laterality: Right;   TOTAL KNEE ARTHROPLASTY Left 09/30/2021   Procedure: TOTAL KNEE  ARTHROPLASTY;  Surgeon: Liliane Rei, MD;  Location: [facility] ORS;  Service: Orthopedics;  Laterality: Left;   WISDOM TOOTH EXTRACTION      Family History family history includes Breast cancer (age of onset: 31) in her mother; Heart disease in an other family member.  Social History Social History   Socioeconomic History   Marital status: Widowed    Spouse  name: Not on file   Number of children: Not on file   Years of education: Not on file   Highest education level: Not on file  Occupational History   Occupation: Teacher    Employer: RETIRED  Tobacco Use   Smoking status: Former    Types: Cigarettes   Smokeless tobacco: Never   Tobacco comments:    "Quit smoking cigarettes in my 40's "  Vaping Use   Vaping status: Never Used  Substance and Sexual Activity   Alcohol  use: Yes    Alcohol /week: 0.0 standard drinks of alcohol     Comment: Socially   Drug use: No   Sexual activity: Not on file  Other Topics Concern   Not on file  Social History Narrative   Lives alone.   Social Drivers of Corporate investment banker Strain: Not on file  Food Insecurity: No Food Insecurity (08/04/2023)   Hunger Vital Sign    Worried About Running Out of Food in the Last Year: Never true    Ran Out of Food in the Last Year: Never true  Transportation Needs: No Transportation Needs (08/04/2023)   PRAPARE - Administrator, Civil Service (Medical): No    Lack of Transportation (Non-Medical): No  Physical Activity: Not on file  Stress: Not on file  Social Connections: Not on file  Intimate Partner Violence: Not At Risk (08/04/2023)   Humiliation, Afraid, Rape, and Kick questionnaire    Fear of Current or Ex-Partner: No    Emotionally Abused: No    Physically Abused: No    Sexually Abused: No    Laboratory Investigations Lab Results  Component Value Date   TSH 1.62 02/16/2024   FREET4 1.3 02/16/2024     Lab Results  Component Value Date   TSI <89 01/12/2023     Lab  Results  Component Value Date   CREATININE 0.56 (L) 10/12/2023   No results found for: "GFR"    Component Value Date/Time   NA 140 10/12/2023 1125   K 4.2 10/12/2023 1125   CL 100 10/12/2023 1125   CO2 33 (H) 10/12/2023 1125   GLUCOSE 85 10/12/2023 1125   BUN 20 10/12/2023 1125   CREATININE 0.56 (L) 10/12/2023 1125   CALCIUM  9.7 10/12/2023 1125   PROT 5.9 (L) 08/10/2023 0336   ALBUMIN  2.8 (L) 08/10/2023 0336   AST 78 (H) 08/10/2023 0336   ALT 119 (H) 08/10/2023 0336   ALKPHOS 70 08/10/2023 0336   BILITOT 0.6 08/10/2023 0336   GFRNONAA >60 08/10/2023 0336   GFRAA >60 09/30/2019 0430      Latest Ref Rng & Units 10/12/2023   11:25 AM 08/10/2023    3:36 AM 08/08/2023    3:17 AM  BMP  Glucose 65 - 99 mg/dL 85  91  90   BUN 7 - 25 mg/dL 20  15  16    Creatinine 0.60 - 0.95 mg/dL 1.61  0.96  0.45   BUN/Creat Ratio 6 - 22 (calc) 36     Sodium 135 - 146 mmol/L 140  141  139   Potassium 3.5 - 5.3 mmol/L 4.2  3.7  4.1   Chloride 98 - 110 mmol/L 100  108  105   CO2 20 - 32 mmol/L 33  25  26   Calcium  8.6 - 10.4 mg/dL 9.7  8.7  8.6        Component Value Date/Time   WBC 4.9 08/10/2023 0336   RBC 3.31 (L)  08/10/2023 0336   HGB 10.9 (L) 08/10/2023 0336   HCT 33.5 (L) 08/10/2023 0336   PLT 179 08/10/2023 0336   MCV 101.2 (H) 08/10/2023 0336   MCH 32.9 08/10/2023 0336   MCHC 32.5 08/10/2023 0336   RDW 15.8 (H) 08/10/2023 0336   LYMPHSABS 1.1 08/10/2023 0336   MONOABS 0.5 08/10/2023 0336   EOSABS 0.0 08/10/2023 0336   BASOSABS 0.0 08/10/2023 0336      Parts of this note may have been dictated using voice recognition software. There may be variances in spelling and vocabulary which are unintentional. Not all errors are proofread. Please notify the [facility] if any discrepancies are noted or if the meaning of any statement is not clear.

## 2024-02-25 DIAGNOSIS — R350 Frequency of micturition: Secondary | ICD-10-CM | POA: Diagnosis not present

## 2024-03-03 DIAGNOSIS — R350 Frequency of micturition: Secondary | ICD-10-CM | POA: Diagnosis not present

## 2024-03-09 DIAGNOSIS — M4646 Discitis, unspecified, lumbar region: Secondary | ICD-10-CM | POA: Diagnosis not present

## 2024-03-10 DIAGNOSIS — R350 Frequency of micturition: Secondary | ICD-10-CM | POA: Diagnosis not present

## 2024-03-24 DIAGNOSIS — R350 Frequency of micturition: Secondary | ICD-10-CM | POA: Diagnosis not present

## 2024-03-31 DIAGNOSIS — R350 Frequency of micturition: Secondary | ICD-10-CM | POA: Diagnosis not present

## 2024-04-08 DIAGNOSIS — M4646 Discitis, unspecified, lumbar region: Secondary | ICD-10-CM | POA: Diagnosis not present

## 2024-04-14 ENCOUNTER — Emergency Department (HOSPITAL_BASED_OUTPATIENT_CLINIC_OR_DEPARTMENT_OTHER)

## 2024-04-14 ENCOUNTER — Observation Stay (HOSPITAL_BASED_OUTPATIENT_CLINIC_OR_DEPARTMENT_OTHER)
Admission: EM | Admit: 2024-04-14 | Discharge: 2024-04-15 | Disposition: A | Discharge status: Home / Self Care | Attending: Internal Medicine | Admitting: Internal Medicine

## 2024-04-14 ENCOUNTER — Other Ambulatory Visit: Payer: Self-pay

## 2024-04-14 ENCOUNTER — Observation Stay (HOSPITAL_COMMUNITY)

## 2024-04-14 ENCOUNTER — Encounter (HOSPITAL_BASED_OUTPATIENT_CLINIC_OR_DEPARTMENT_OTHER): Payer: Self-pay

## 2024-04-14 ENCOUNTER — Encounter: Payer: Self-pay | Admitting: Internal Medicine

## 2024-04-14 DIAGNOSIS — E039 Hypothyroidism, unspecified: Secondary | ICD-10-CM | POA: Diagnosis not present

## 2024-04-14 DIAGNOSIS — G459 Transient cerebral ischemic attack, unspecified: Principal | ICD-10-CM | POA: Insufficient documentation

## 2024-04-14 DIAGNOSIS — I10 Essential (primary) hypertension: Secondary | ICD-10-CM | POA: Insufficient documentation

## 2024-04-14 DIAGNOSIS — F10920 Alcohol use, unspecified with intoxication, uncomplicated: Secondary | ICD-10-CM | POA: Insufficient documentation

## 2024-04-14 DIAGNOSIS — E0590 Thyrotoxicosis, unspecified without thyrotoxic crisis or storm: Secondary | ICD-10-CM | POA: Diagnosis not present

## 2024-04-14 DIAGNOSIS — I1310 Hypertensive heart and chronic kidney disease without heart failure, with stage 1 through stage 4 chronic kidney disease, or unspecified chronic kidney disease: Secondary | ICD-10-CM | POA: Insufficient documentation

## 2024-04-14 DIAGNOSIS — Z79899 Other long term (current) drug therapy: Secondary | ICD-10-CM | POA: Diagnosis not present

## 2024-04-14 DIAGNOSIS — I63512 Cerebral infarction due to unspecified occlusion or stenosis of left middle cerebral artery: Secondary | ICD-10-CM | POA: Diagnosis not present

## 2024-04-14 DIAGNOSIS — R410 Disorientation, unspecified: Secondary | ICD-10-CM | POA: Diagnosis not present

## 2024-04-14 DIAGNOSIS — R569 Unspecified convulsions: Secondary | ICD-10-CM

## 2024-04-14 DIAGNOSIS — R4182 Altered mental status, unspecified: Secondary | ICD-10-CM | POA: Diagnosis not present

## 2024-04-14 DIAGNOSIS — Z7982 Long term (current) use of aspirin: Secondary | ICD-10-CM | POA: Diagnosis not present

## 2024-04-14 DIAGNOSIS — R4701 Aphasia: Secondary | ICD-10-CM | POA: Insufficient documentation

## 2024-04-14 DIAGNOSIS — E785 Hyperlipidemia, unspecified: Secondary | ICD-10-CM | POA: Diagnosis not present

## 2024-04-14 DIAGNOSIS — R29818 Other symptoms and signs involving the nervous system: Secondary | ICD-10-CM | POA: Diagnosis not present

## 2024-04-14 DIAGNOSIS — N189 Chronic kidney disease, unspecified: Secondary | ICD-10-CM | POA: Insufficient documentation

## 2024-04-14 DIAGNOSIS — I639 Cerebral infarction, unspecified: Secondary | ICD-10-CM | POA: Diagnosis not present

## 2024-04-14 DIAGNOSIS — J449 Chronic obstructive pulmonary disease, unspecified: Secondary | ICD-10-CM | POA: Insufficient documentation

## 2024-04-14 LAB — CBC WITH DIFFERENTIAL/PLATELET
Abs Immature Granulocytes: 0.03 K/uL (ref 0.00–0.07)
Basophils Absolute: 0 K/uL (ref 0.0–0.1)
Basophils Relative: 0 %
Eosinophils Absolute: 0.1 K/uL (ref 0.0–0.5)
Eosinophils Relative: 1 %
HCT: 36.8 % (ref 36.0–46.0)
Hemoglobin: 12.4 g/dL (ref 12.0–15.0)
Immature Granulocytes: 0 %
Lymphocytes Relative: 6 %
Lymphs Abs: 0.6 K/uL — ABNORMAL LOW (ref 0.7–4.0)
MCH: 32.3 pg (ref 26.0–34.0)
MCHC: 33.7 g/dL (ref 30.0–36.0)
MCV: 95.8 fL (ref 80.0–100.0)
Monocytes Absolute: 0.5 K/uL (ref 0.1–1.0)
Monocytes Relative: 5 %
Neutro Abs: 8.1 K/uL — ABNORMAL HIGH (ref 1.7–7.7)
Neutrophils Relative %: 88 %
Platelets: 220 K/uL (ref 150–400)
RBC: 3.84 MIL/uL — ABNORMAL LOW (ref 3.87–5.11)
RDW: 14.5 % (ref 11.5–15.5)
WBC: 9.4 K/uL (ref 4.0–10.5)
nRBC: 0 % (ref 0.0–0.2)

## 2024-04-14 LAB — URINALYSIS, ROUTINE W REFLEX MICROSCOPIC
Bilirubin Urine: NEGATIVE
Glucose, UA: NEGATIVE mg/dL
Ketones, ur: NEGATIVE mg/dL
Nitrite: NEGATIVE
Protein, ur: NEGATIVE mg/dL
Specific Gravity, Urine: 1.009 (ref 1.005–1.030)
WBC, UA: >50 WBC/hpf (ref 0–5)
pH: 5.5 (ref 5.0–8.0)

## 2024-04-14 LAB — COMPREHENSIVE METABOLIC PANEL WITH GFR
ALT: 9 U/L (ref 0–44)
AST: 10 U/L — ABNORMAL LOW (ref 15–41)
Albumin: 4.1 g/dL (ref 3.5–5.0)
Alkaline Phosphatase: 87 U/L (ref 38–126)
Anion gap: 12 (ref 5–15)
BUN: 17 mg/dL (ref 8–23)
CO2: 25 mmol/L (ref 22–32)
Calcium: 9.7 mg/dL (ref 8.9–10.3)
Chloride: 101 mmol/L (ref 98–111)
Creatinine, Ser: 0.52 mg/dL (ref 0.44–1.00)
GFR, Estimated: >60 mL/min (ref >60)
Glucose, Bld: 109 mg/dL — ABNORMAL HIGH (ref 70–99)
Potassium: 3.9 mmol/L (ref 3.5–5.1)
Sodium: 138 mmol/L (ref 135–145)
Total Bilirubin: 0.6 mg/dL (ref 0.0–1.2)
Total Protein: 7 g/dL (ref 6.5–8.1)

## 2024-04-14 MED ORDER — ENOXAPARIN SODIUM 40 MG/0.4ML IJ SOSY
40.0000 mg | PREFILLED_SYRINGE | INTRAMUSCULAR | Status: DC
  Administered 2024-04-14: 40 mg via SUBCUTANEOUS
  Filled 2024-04-14: qty 0.4

## 2024-04-14 MED ORDER — CLOPIDOGREL BISULFATE 75 MG PO TABS: 75.0000 mg | ORAL_TABLET | Freq: Every day | ORAL | Status: DC

## 2024-04-14 MED ORDER — ASPIRIN 81 MG PO TBEC
81.0000 mg | DELAYED_RELEASE_TABLET | Freq: Every day | ORAL | Status: DC
  Administered 2024-04-15: 81 mg via ORAL
  Filled 2024-04-14: qty 1

## 2024-04-14 MED ORDER — SODIUM CHLORIDE 0.9 % IV SOLN: INTRAVENOUS | Status: DC

## 2024-04-14 MED ORDER — SENNOSIDES-DOCUSATE SODIUM 8.6-50 MG PO TABS
1.0000 | ORAL_TABLET | Freq: Every evening | ORAL | Status: DC | PRN
Start: 2024-04-14 — End: 2024-04-15

## 2024-04-14 MED ORDER — ACETAMINOPHEN 650 MG RE SUPP: 650.0000 mg | RECTAL | Status: DC | PRN

## 2024-04-14 MED ORDER — ACETAMINOPHEN 325 MG PO TABS: 650.0000 mg | ORAL_TABLET | ORAL | Status: DC | PRN

## 2024-04-14 MED ORDER — CLOPIDOGREL BISULFATE 75 MG PO TABS
300.0000 mg | ORAL_TABLET | Freq: Once | ORAL | Status: AC
  Administered 2024-04-15: 300 mg via ORAL
  Filled 2024-04-14: qty 4

## 2024-04-14 MED ORDER — STROKE: EARLY STAGES OF RECOVERY BOOK
Freq: Once | Status: AC
  Filled 2024-04-14: qty 1

## 2024-04-14 MED ORDER — GADOBUTROL 1 MMOL/ML IV SOLN
5.0000 mL | Freq: Once | INTRAVENOUS | Status: AC | PRN
  Administered 2024-04-14: 5 mL via INTRAVENOUS

## 2024-04-14 MED ORDER — ACETAMINOPHEN 160 MG/5ML PO SOLN: 650.0000 mg | ORAL | Status: DC | PRN

## 2024-04-14 NOTE — Plan of Care (Signed)
 Problem: Education: Goal: Knowledge of General Education information will improve Description: Including pain rating scale, medication(s)/side effects and non-pharmacologic comfort measures 04/14/2024 2013 by Evern Monica HERO, RN Outcome: Progressing 04/14/2024 2012 by Evern Monica HERO, RN Outcome: Progressing 04/14/2024 2006 by Evern Monica HERO, RN Outcome: Progressing   Problem: Health Behavior/Discharge Planning: Goal: Ability to manage health-related needs will improve 04/14/2024 2013 by Evern Monica HERO, RN Outcome: Progressing 04/14/2024 2012 by Evern Monica HERO, RN Outcome: Progressing 04/14/2024 2006 by Evern Monica HERO, RN Outcome: Progressing   Problem: Clinical Measurements: Goal: Ability to maintain clinical measurements within normal limits will improve 04/14/2024 2013 by Evern Monica HERO, RN Outcome: Progressing 04/14/2024 2012 by Evern Monica HERO, RN Outcome: Progressing 04/14/2024 2006 by Evern Monica HERO, RN Outcome: Progressing Goal: Will remain free from infection 04/14/2024 2013 by Evern Monica HERO, RN Outcome: Progressing 04/14/2024 2012 by Evern Monica HERO, RN Outcome: Progressing 04/14/2024 2006 by Evern Monica HERO, RN Outcome: Progressing Goal: Diagnostic test results will improve 04/14/2024 2013 by Evern Monica HERO, RN Outcome: Progressing 04/14/2024 2012 by Evern Monica HERO, RN Outcome: Progressing 04/14/2024 2006 by Evern Monica HERO, RN Outcome: Progressing Goal: Respiratory complications will improve 04/14/2024 2013 by Evern Monica HERO, RN Outcome: Progressing 04/14/2024 2012 by Evern Monica HERO, RN Outcome: Progressing 04/14/2024 2006 by Evern Monica HERO, RN Outcome: Progressing Goal: Cardiovascular complication will be avoided 04/14/2024 2013 by Evern Monica HERO, RN Outcome: Progressing 04/14/2024 2012 by Evern Monica HERO, RN Outcome: Progressing 04/14/2024 2006 by Evern Monica HERO, RN Outcome: Progressing   Problem:  Activity: Goal: Risk for activity intolerance will decrease 04/14/2024 2013 by Evern Monica HERO, RN Outcome: Progressing 04/14/2024 2012 by Evern Monica HERO, RN Outcome: Progressing 04/14/2024 2006 by Evern Monica HERO, RN Outcome: Progressing   Problem: Nutrition: Goal: Adequate nutrition will be maintained 04/14/2024 2013 by Evern Monica HERO, RN Outcome: Progressing 04/14/2024 2012 by Evern Monica HERO, RN Outcome: Progressing 04/14/2024 2006 by Evern Monica HERO, RN Outcome: Progressing   Problem: Coping: Goal: Level of anxiety will decrease 04/14/2024 2013 by Evern Monica HERO, RN Outcome: Progressing 04/14/2024 2012 by Evern Monica HERO, RN Outcome: Progressing 04/14/2024 2006 by Evern Monica HERO, RN Outcome: Progressing   Problem: Elimination: Goal: Will not experience complications related to bowel motility 04/14/2024 2013 by Evern Monica HERO, RN Outcome: Progressing 04/14/2024 2012 by Evern Monica HERO, RN Outcome: Progressing 04/14/2024 2006 by Evern Monica HERO, RN Outcome: Progressing Goal: Will not experience complications related to urinary retention 04/14/2024 2013 by Evern Monica HERO, RN Outcome: Progressing 04/14/2024 2012 by Evern Monica HERO, RN Outcome: Progressing 04/14/2024 2006 by Evern Monica HERO, RN Outcome: Progressing   Problem: Pain Managment: Goal: General experience of comfort will improve and/or be controlled 04/14/2024 2013 by Evern Monica HERO, RN Outcome: Progressing 04/14/2024 2012 by Evern Monica HERO, RN Outcome: Progressing 04/14/2024 2006 by Evern Monica HERO, RN Outcome: Progressing   Problem: Safety: Goal: Ability to remain free from injury will improve 04/14/2024 2013 by Evern Monica HERO, RN Outcome: Progressing 04/14/2024 2012 by Evern Monica HERO, RN Outcome: Progressing 04/14/2024 2006 by Evern Monica HERO, RN Outcome: Progressing   Problem: Skin Integrity: Goal: Risk for impaired skin integrity will decrease 04/14/2024 2013  by Evern Monica HERO, RN Outcome: Progressing 04/14/2024 2012 by Evern Monica HERO, RN Outcome: Progressing 04/14/2024 2006 by Evern Monica HERO, RN Outcome: Progressing   Problem: Education: Goal: Knowledge of disease or condition will improve 04/14/2024 2013 by Evern Monica HERO, RN Outcome: Progressing 04/14/2024 2012 by Evern,  Monica HERO, RN Outcome: Progressing 04/14/2024 2006 by Evern Monica HERO, RN Outcome: Progressing Goal: Knowledge of secondary prevention will improve (MUST DOCUMENT ALL) 04/14/2024 2013 by Evern Monica HERO, RN Outcome: Progressing 04/14/2024 2012 by Evern Monica HERO, RN Outcome: Progressing 04/14/2024 2006 by Evern Monica HERO, RN Outcome: Progressing Goal: Knowledge of patient specific risk factors will improve (DELETE if not current risk factor) 04/14/2024 2013 by Evern Monica HERO, RN Outcome: Progressing 04/14/2024 2012 by Evern Monica HERO, RN Outcome: Progressing 04/14/2024 2006 by Evern Monica HERO, RN Outcome: Progressing   Problem: Ischemic Stroke/TIA Tissue Perfusion: Goal: Complications of ischemic stroke/TIA will be minimized 04/14/2024 2013 by Evern Monica HERO, RN Outcome: Progressing 04/14/2024 2012 by Evern Monica HERO, RN Outcome: Progressing 04/14/2024 2006 by Evern Monica HERO, RN Outcome: Progressing   Problem: Coping: Goal: Will verbalize positive feelings about self 04/14/2024 2013 by Evern Monica HERO, RN Outcome: Progressing 04/14/2024 2012 by Evern Monica HERO, RN Outcome: Progressing 04/14/2024 2006 by Evern Monica HERO, RN Outcome: Progressing Goal: Will identify appropriate support needs 04/14/2024 2013 by Evern Monica HERO, RN Outcome: Progressing 04/14/2024 2012 by Evern Monica HERO, RN Outcome: Progressing 04/14/2024 2006 by Evern Monica HERO, RN Outcome: Progressing   Problem: Health Behavior/Discharge Planning: Goal: Ability to manage health-related needs will improve 04/14/2024 2013 by Evern Monica HERO, RN Outcome:  Progressing 04/14/2024 2012 by Evern Monica HERO, RN Outcome: Progressing 04/14/2024 2006 by Evern Monica HERO, RN Outcome: Progressing Goal: Goals will be collaboratively established with patient/family 04/14/2024 2013 by Evern Monica HERO, RN Outcome: Progressing 04/14/2024 2012 by Evern Monica HERO, RN Outcome: Progressing 04/14/2024 2006 by Evern Monica HERO, RN Outcome: Progressing   Problem: Self-Care: Goal: Ability to participate in self-care as condition permits will improve 04/14/2024 2013 by Evern Monica HERO, RN Outcome: Progressing 04/14/2024 2012 by Evern Monica HERO, RN Outcome: Progressing 04/14/2024 2006 by Evern Monica HERO, RN Outcome: Progressing Goal: Verbalization of feelings and concerns over difficulty with self-care will improve 04/14/2024 2013 by Evern Monica HERO, RN Outcome: Progressing 04/14/2024 2012 by Evern Monica HERO, RN Outcome: Progressing 04/14/2024 2006 by Evern Monica HERO, RN Outcome: Progressing Goal: Ability to communicate needs accurately will improve 04/14/2024 2013 by Evern Monica HERO, RN Outcome: Progressing 04/14/2024 2012 by Evern Monica HERO, RN Outcome: Progressing 04/14/2024 2006 by Evern Monica HERO, RN Outcome: Progressing   Problem: Nutrition: Goal: Risk of aspiration will decrease 04/14/2024 2013 by Evern Monica HERO, RN Outcome: Progressing 04/14/2024 2012 by Evern Monica HERO, RN Outcome: Progressing 04/14/2024 2006 by Evern Monica HERO, RN Outcome: Progressing Goal: Dietary intake will improve 04/14/2024 2013 by Evern Monica HERO, RN Outcome: Progressing 04/14/2024 2012 by Evern Monica HERO, RN Outcome: Progressing 04/14/2024 2006 by Evern Monica HERO, RN Outcome: Progressing   Problem: Education: Goal: Knowledge of disease or condition will improve 04/14/2024 2013 by Evern Monica HERO, RN Outcome: Progressing 04/14/2024 2012 by Evern Monica HERO, RN Outcome: Progressing Goal: Knowledge of secondary prevention will improve  (MUST DOCUMENT ALL) 04/14/2024 2013 by Evern Monica HERO, RN Outcome: Progressing 04/14/2024 2012 by Evern Monica HERO, RN Outcome: Progressing Goal: Knowledge of patient specific risk factors will improve (DELETE if not current risk factor) 04/14/2024 2013 by Evern Monica HERO, RN Outcome: Progressing 04/14/2024 2012 by Evern Monica HERO, RN Outcome: Progressing   Problem: Ischemic Stroke/TIA Tissue Perfusion: Goal: Complications of ischemic stroke/TIA will be minimized 04/14/2024 2013 by Evern Monica HERO, RN Outcome: Progressing 04/14/2024 2012 by Evern Monica HERO, RN Outcome: Progressing   Problem: Coping: Goal: Will verbalize  positive feelings about self 04/14/2024 2013 by Evern Monica HERO, RN Outcome: Progressing 04/14/2024 2012 by Evern Monica HERO, RN Outcome: Progressing Goal: Will identify appropriate support needs 04/14/2024 2013 by Evern Monica HERO, RN Outcome: Progressing 04/14/2024 2012 by Evern Monica HERO, RN Outcome: Progressing   Problem: Health Behavior/Discharge Planning: Goal: Ability to manage health-related needs will improve 04/14/2024 2013 by Evern Monica HERO, RN Outcome: Progressing 04/14/2024 2012 by Evern Monica HERO, RN Outcome: Progressing Goal: Goals will be collaboratively established with patient/family 04/14/2024 2013 by Evern Monica HERO, RN Outcome: Progressing 04/14/2024 2012 by Evern Monica HERO, RN Outcome: Progressing   Problem: Self-Care: Goal: Ability to participate in self-care as condition permits will improve 04/14/2024 2013 by Evern Monica HERO, RN Outcome: Progressing 04/14/2024 2012 by Evern Monica HERO, RN Outcome: Progressing Goal: Verbalization of feelings and concerns over difficulty with self-care will improve 04/14/2024 2013 by Evern Monica HERO, RN Outcome: Progressing 04/14/2024 2012 by Evern Monica HERO, RN Outcome: Progressing Goal: Ability to communicate needs accurately will improve 04/14/2024 2013 by Evern Monica HERO, RN Outcome: Progressing 04/14/2024 2012 by Evern Monica HERO, RN Outcome: Progressing   Problem: Nutrition: Goal: Risk of aspiration will decrease 04/14/2024 2013 by Evern Monica HERO, RN Outcome: Progressing 04/14/2024 2012 by Evern Monica HERO, RN Outcome: Progressing Goal: Dietary intake will improve 04/14/2024 2013 by Evern Monica HERO, RN Outcome: Progressing 04/14/2024 2012 by Evern Monica HERO, RN Outcome: Progressing

## 2024-04-14 NOTE — ED Notes (Signed)
 Attempted to call report x2 at this time, no answer.

## 2024-04-14 NOTE — ED Provider Notes (Signed)
 [facility] EMERG[facility]Y DE[facility]MENT AT [facility] Provider Note   CSN: [id number] Arrival date & time: 04/14/24  1250     Patient presents with: Altered Mental Status and Aphasia   Ariel Lambert is a 88 y.o. female.   Patient here after about an hour of difficulty with speech.  Family and patient described that she was having a hard time getting words out.  Patient felt frustrated because she could not answer the questions how she wanted to.  Sounds like she was having some word salad.  She has history of stroke and TIA.  Seems like she is back to her baseline now.  She did not have any vision changes weakness numbness tingling.  Family states that she did have a pretty busy day yesterday but otherwise has been in her normal state of health.  Her prior strokes have not involved any speech issues.  She takes an aspirin .  She also has a history of hypertension high cholesterol COPD.  She denies any difficulty with speech now.  She does not seem confused.  Family states that this lasted maybe 45 minutes to 60 minutes but now she seems to be doing much better.  She is not having any pain with urination cough fever or chills.  The history is provided by the patient and a relative.       Prior to Admission medications   Medication Sig Start Date End Date Taking? Authorizing Provider  valsartan -hydrochlorothiazide  (DIOVAN -HCT) 160-12.5 MG tablet Take 1 tablet by mouth daily. 02/09/24  Yes [provider]  acetaminophen  (TYLENOL ) 325 MG tablet Take 2 tablets (650 mg total) by mouth every 6 (six) hours as needed for fever, headache or mild pain. 02/11/23   Sherrill Cable Latif, DO  albuterol  (VENTOLIN  HFA) 108 (90 Base) MCG/ACT inhaler Inhale 2 puffs into the lungs every 6 (six) hours as needed for wheezing or shortness of breath (Asthma). 08/10/23 11/08/23  Laurence Locus, DO  aspirin  EC 81 MG tablet Take 81 mg by mouth daily. Swallow whole.    [provider]  azelastine   (ASTELIN ) 0.1 % nasal spray Place 2 sprays into both nostrils every evening. Use in each nostril as directed    [provider]  Biotin 5000 MCG CAPS Take 5,000 mcg by mouth daily.    [provider]  Calcium  Carb-Cholecalciferol (CALCIUM  600+D3) 600-20 MG-MCG TABS Take 1 tablet by mouth 2 (two) times daily.    [provider]  cephALEXin  (KEFLEX ) 500 MG capsule Take 1 capsule (500 mg total) by mouth 3 (three) times daily. 09/18/23   Luiz Channel, MD  diphenhydrAMINE  (BENADRYL ) 25 MG tablet Take 25 mg by mouth daily as needed for allergies.    [provider]  doxycycline  (VIBRA -TABS) 100 MG tablet Take 1 tablet (100 mg total) by mouth 2 (two) times daily. 09/18/23   Luiz Channel, MD  ezetimibe -simvastatin  (VYTORIN ) 10-20 MG tablet Take 1 tablet by mouth daily after breakfast. 08/10/23 11/08/23  Laurence Locus, DO  fexofenadine (ALLEGRA) 180 MG tablet 1 tablet Swallow whole with water ; do not take with fruit juices. Orally Once a day for 30 day(s) 11/12/23   [provider]  hydrocortisone cream 1 % Apply 1 Application topically daily as needed (Ezema).    [provider]  hydroxypropyl methylcellulose / hypromellose (ISOPTO TEARS / GONIOVISC) 2.5 % ophthalmic solution Place 1 drop into both eyes daily.    [provider]  irbesartan  (AVAPRO ) 75 MG tablet Take 1 tablet (75 mg  total) by mouth daily. 08/10/23 11/08/23  Laurence Locus, DO  levalbuterol  (XOPENEX ) 0.63 MG/3ML nebulizer solution Take 3 mLs (0.63 mg total) by nebulization every 6 (six) hours as needed for up to 120 doses for wheezing or shortness of breath. 08/10/23   Laurence Locus, DO  MAGNESIUM  GLUCONATE PO Take 400 mg by mouth daily.    [provider]  methimazole  (TAPAZOLE ) 5 MG tablet Alternate 5 mg one day and 10 mg the next day 02/23/24   Motwani, Komal, MD  mirtazapine  (REMERON  SOL-TAB) 30 MG disintegrating tablet Take 1 tablet (30 mg total) by mouth at bedtime. 08/10/23  11/08/23  Laurence Locus, DO  montelukast  (SINGULAIR ) 10 MG tablet Take 1 tablet (10 mg total) by mouth at bedtime. 08/10/23 11/08/23  Laurence Locus, DO  Multiple Vitamin (MULTIVITAMIN WITH MINERALS) TABS tablet Take 1 tablet by mouth daily.    [provider]  SYMBICORT  160-4.5 MCG/ACT inhaler Inhale 2 puffs into the lungs 2 (two) times daily. 08/10/23 11/08/23  Laurence Locus, DO    Allergies: Cetirizine, Codeine, Hydrocodone , Meperidine hcl, Nabumetone, Naproxen, Sulfa antibiotics, and Sulfonamide derivatives    Review of Systems  Updated Vital Signs BP (!) 144/93 (BP Location: Left Arm)   Pulse 76   Temp 98 F (36.7 C)   Resp 17   SpO2 93%   Physical Exam Vitals and nursing note reviewed.  Constitutional:      General: She is not in acute distress.    Appearance: She is well-developed. She is not ill-appearing.  HENT:     Head: Normocephalic and atraumatic.     Nose: Nose normal.     Mouth/Throat:     Mouth: Mucous membranes are moist.  Eyes:     Extraocular Movements: Extraocular movements intact.     Conjunctiva/sclera: Conjunctivae normal.     Pupils: Pupils are equal, round, and reactive to light.  Cardiovascular:     Rate and Rhythm: Normal rate and regular rhythm.     Pulses: Normal pulses.     Heart sounds: Normal heart sounds. No murmur heard. Pulmonary:     Effort: Pulmonary effort is normal. No respiratory distress.     Breath sounds: Normal breath sounds.  Abdominal:     General: Abdomen is flat.     Palpations: Abdomen is soft.     Tenderness: There is no abdominal tenderness.  Musculoskeletal:        General: No swelling.     Cervical back: Normal range of motion and neck supple.  Skin:    General: Skin is warm and dry.     Capillary Refill: Capillary refill takes less than 2 seconds.  Neurological:     General: No focal deficit present.     Mental Status: She is alert and oriented to person, place, and time.     Cranial Nerves: No cranial nerve  deficit.     Sensory: No sensory deficit.     Motor: No weakness.     Coordination: Coordination normal.     Comments: 5+ out of 5 strength throughout, normal sensation, no drift, normal finger-to-nose finger, normal speech, normal visual fields  Psychiatric:        Mood and Affect: Mood normal.     (all labs ordered are listed, but only abnormal results are displayed) Labs Reviewed  CBC WITH DIFFERENTIAL/PLATELET - Abnormal; Notable for the following components:      Result Value   RBC 3.84 (*)    Neutro Abs 8.1 (*)  Lymphs Abs 0.6 (*)    All other components within normal limits  COMPREHENSIVE METABOLIC PANEL WITH GFR - Abnormal; Notable for the following components:   Glucose, Bld 109 (*)    AST 10 (*)    All other components within normal limits  URINALYSIS, ROUTINE W REFLEX MICROSCOPIC - Abnormal; Notable for the following components:   Hgb urine dipstick SMALL (*)    Leukocytes,Ua MODERATE (*)    Bacteria, UA RARE (*)    All other components within normal limits    EKG: EKG Interpretation Date/Time:  Thursday April 14 2024 14:15:15 EDT Ventricular Rate:  67 PR Interval:  145 QRS Duration:  113 QT Interval:  450 QTC Calculation: 476 R Axis:   -52  Text Interpretation: Sinus rhythm Confirmed by Ruthe Cornet [phone]) on 04/14/2024 3:13:26 PM  Radiology: [facility] Chest Portable 1 View Result Date: 04/14/2024 CLINICAL DATA:  Altered mental status. EXAM: PORTABLE CHEST 1 VIEW COMPARISON:  August 04, 2023. FINDINGS: The heart size and mediastinal contours are within normal limits. Minimal bibasilar subsegmental atelectasis or scarring is noted. The visualized skeletal structures are unremarkable. IMPRESSION: Minimal bibasilar subsegmental atelectasis or scarring. Electronically Signed   By: Lynwood Landy Raddle M.D.   On: 04/14/2024 14:40     Procedures   Medications Ordered in the ED - No data to display                                  Medical Decision Making Amount  and/or Complexity of Data Reviewed Labs: ordered. Radiology: ordered.   Almarie LABOR Tropea is here with aphasia strokelike symptoms.  Normal vitals.  No fever.  She had about 45 minutes of difficulty getting words out.  Was getting frustrated because she could not answer the questions that her family members were asking her.  This lasted about 4560 minutes.  She has a history of stroke.  No residual deficits from the stroke.  Family thought that maybe she was just confused because she had a very long day yesterday but she seems to be back at her baseline now.  She has no weakness numbness tingling.  No speech difficulty now.  No visual field deficits.  Overall differential possibly TIA seems less likely to be metabolic infectious process.  CBC BMP urinalysis CT scan of the head chest x-ray EKG was obtained  EKG shows sinus rhythm.  No ischemic changes.  Lab work showed no significant leukocytosis anemia or electrolyte abnormality.  Urinalysis negative for infection.  Chest x-ray shows no signs of pneumonia or pneumothorax.  Head CT per my review interpretation is unremarkable.  Awaiting radiology report.  Ultimately I talked with Dr.Khalquinda with neurology and I want admit the patient for TIA workup.  He agrees.  Will admit to hospitalist for further care.  This chart was dictated using voice recognition software.  Despite best efforts to proofread,  errors can occur which can change the documentation meaning.       Final diagnoses:  TIA (transient ischemic attack)    ED Discharge Orders     None          Ruthe Cornet, DO 04/14/24 1528

## 2024-04-14 NOTE — ED Triage Notes (Signed)
 Pt w daughters, concerned for confusion- not making sense at all, which is not like her.  Yesterday had a very busy day so she did walk a lot- today got up a little late (9:30-10a), confused this AM.   BP 150/102 at home, HR 77, O2 at home 81

## 2024-04-14 NOTE — Consult Note (Signed)
 NEUROLOGY CONSULT NOTE   Date of service: April 14, 2024 Patient Name: ALLETTA MATTOS MRN:  [id number] DOB:  05/15/1936 Chief Complaint: aphasia Requesting Provider: Trixie Nilda HERO, MD  History of Present Illness  KATALENA MALVEAUX is a 88 y.o. female with hx of CKD, previous CVA, hypertension who presents with transient aphasia.  She reportedly was doing well on 7/30 prior to bed, but on awakening she noticed that she was having difficulties with speaking.  Her difficulty with speech lasted about 45 to 60 minutes.  There was no focal numbness, weakness, visual change, or other symptoms.  She currently has returned completely to baseline.   Of note, she does have a history of previous cortical stroke.  She states that she had trouble writing, but does not remember having trouble talking with that event.  LKW: 7/30 prior to bed IV Thrombolysis: Outside of window EVT: No, improved symptoms  NIHSS components Score: Comment  1a Level of Conscious 0[]  1[]  2[]  3[]      1b LOC Questions 0[]  1[]  2[]       1c LOC Commands 0[]  1[]  2[]       2 Best Gaze 0[]  1[]  2[]       3 Visual 0[]  1[]  2[]  3[]      4 Facial Palsy 0[]  1[]  2[]  3[]      5a Motor Arm - left 0[]  1[]  2[]  3[]  4[]  UN[]    5b Motor Arm - Right 0[]  1[]  2[]  3[]  4[]  UN[]    6a Motor Leg - Left 0[]  1[]  2[]  3[]  4[]  UN[]    6b Motor Leg - Right 0[]  1[]  2[]  3[]  4[]  UN[]    7 Limb Ataxia 0[]  1[]  2[]  UN[]      8 Sensory 0[]  1[]  2[]  UN[]      9 Best Language 0[]  1[x]  2[]  3[]      10 Dysarthria 0[]  1[]  2[]  UN[]      11 Extinct. and Inattention 0[]  1[]  2[]       TOTAL: 1    Past History   Past Medical History:  Diagnosis Date   Allergy    Arthritis    ASTHMA 05/03/2009   Asthma    Carotid stenosis    Chronic kidney disease     in remission   COPD (chronic obstructive pulmonary disease) (HCC)    CVA (cerebral infarction)    Dyslipidemia    Headache    Hearing aid worn    B/L   HYPERTENSION 04/30/2007   off of meds since   01/2023   HYPERTHYROIDISM 06/17/2007   Hyperthyroidism    Menopause    Multinodular goiter    OSTEOPOROSIS 04/30/2007   Pneumonia    Stroke [facility])    Surgical site infection 04/23/2023   Thyrotoxicosis 06/17/2007   Qualifier: Diagnosis of   By: Kassie MD, Alyce DELENA        Wears glasses     Past Surgical History:  Procedure Laterality Date   BACK SURGERY     BREAST EXCISIONAL BIOPSY Left    carotid artery surgery     CATARACT EXTRACTION W/ INTRAOCULAR LENS  IMPLANT, BILATERAL     ENDARTERECTOMY Right 09/29/2019   Procedure: REDO OF RIGHT ENDARTERECTOMY CAROTID;  Surgeon: Sheree Penne Bruckner, MD;  Location: [facility] OR;  Service: Vascular;  Laterality: Right;   INCISION AND DRAINAGE OF WOUND Right 04/25/2023   Procedure: IRRIGATION AND DEBRIDEMENT HIP WOUND; APPLICATION OF NEGATIVE PRESSURE WOUND VAC;  Surgeon: Fidel Rogue, MD;  Location: [facility] ORS;  Service: Orthopedics;  Laterality: Right;  220  NECK SURGERY     OTHER SURGICAL HISTORY     discectomy   TOTAL HIP ARTHROPLASTY Right 02/06/2023   Procedure: ANTERIOR TOTAL HIP ARTHROPLASTY;  Surgeon: Fidel Rogue, MD;  Location: [facility] OR;  Service: Orthopedics;  Laterality: Right;   TOTAL KNEE ARTHROPLASTY Left 09/30/2021   Procedure: TOTAL KNEE ARTHROPLASTY;  Surgeon: Melodi Lerner, MD;  Location: [facility];  Service: Orthopedics;  Laterality: Left;   WISDOM TOOTH EXTRACTION      Family History: Family History  Problem Relation Age of Onset   Heart disease Other    Breast cancer Mother 41    Social History  reports that she has quit smoking. Her smoking use included cigarettes. She has never used smokeless tobacco. She reports current alcohol  use. She reports that she does not use drugs.  Allergies  Allergen Reactions   Cetirizine Other (See Comments)    Stomach pain   Codeine Itching    Severe itching   Hydrocodone  Itching   Meperidine Hcl Diarrhea and Nausea And Vomiting    (Demerol)   Nabumetone Rash    (Relafen)    Naproxen Itching and Rash   Sulfa Antibiotics Rash   Sulfonamide Derivatives Rash    Medications   Current Facility-Administered Medications:    [START ON 04/15/2024]  stroke: early stages of recovery book, , Does not apply, Once, Gherghe, Costin M, MD   0.9 %  sodium chloride  infusion, , Intravenous, Continuous, Gherghe, Costin M, MD   acetaminophen  (TYLENOL ) tablet 650 mg, 650 mg, Oral, Q4H PRN **OR** acetaminophen  (TYLENOL ) 160 MG/5ML solution 650 mg, 650 mg, Per Tube, Q4H PRN **OR** acetaminophen  (TYLENOL ) suppository 650 mg, 650 mg, Rectal, Q4H PRN, Gherghe, Costin M, MD   enoxaparin  (LOVENOX ) injection 40 mg, 40 mg, Subcutaneous, Q24H, Gherghe, Costin M, MD, 40 mg at Apr 20, 2024 1810   senna-docusate (Senokot-S) tablet 1 tablet, 1 tablet, Oral, QHS PRN, Trixie Nilda HERO, MD  Vitals   Vitals:   04/20/24 1430 04-20-2024 1530 April 20, 2024 1555 2024/04/20 1645  BP:   (!) 151/93 (!) 166/78  Pulse: 68 69 65 67  Resp: (!) 22 (!) 22 16 18   Temp:   97.9 F (36.6 C) (!) 97.5 F (36.4 C)  TempSrc:   Oral   SpO2: 95% 94% 94% 100%    There is no height or weight on file to calculate BMI.   Physical Exam   Constitutional: Appears well-developed and well-nourished.  Neurologic Examination    Neuro: Mental Status: Patient is awake, alert, oriented to person, place, month, year, and situation. Patient is able to give a clear and coherent history. No signs of neglect.  She has hesitancy to her speech that seems like it may be a very mild expressive aphasia, but she is able to name objects, no other definite signs of aphasia. Cranial Nerves: II: Visual Fields are full. Pupils are equal, round, and reactive to light.   III,IV, VI: EOMI without ptosis or diploplia.  V: Facial sensation is symmetric to temperature VII: Facial movement is symmetric.  VIII: hearing is intact to voice X: Uvula elevates symmetrically XII: tongue is midline without atrophy or fasciculations.  Motor: Tone is normal.  Bulk is normal. 5/5 strength was present in all four extremities.  Sensory: Sensation is symmetric to light touch and temperature in the arms and legs. Cerebellar: Finger-nose-finger is slower on the right than left, but no clear ataxia    Labs/Imaging/Neurodiagnostic studies   CBC:  Recent Labs  Lab 04/20/24 1415  WBC  9.4  NEUTROABS 8.1*  HGB 12.4  HCT 36.8  MCV 95.8  PLT 220   Basic Metabolic Panel:  Lab Results  Component Value Date   NA 138 04/14/2024   K 3.9 04/14/2024   CO2 25 04/14/2024   GLUCOSE 109 (H) 04/14/2024   BUN 17 04/14/2024   CREATININE 0.52 04/14/2024   CALCIUM  9.7 04/14/2024   GFRNONAA >60 04/14/2024   GFRAA >60 09/30/2019    INR  Lab Results  Component Value Date   INR 1.1 08/04/2023   APTT  Lab Results  Component Value Date   APTT 26 08/05/2023    CT Head without contrast(Personally reviewed): Old left cortical in  ASSESSMENT   LUCERO AUZENNE is a 88 y.o. female with previous cortical left-sided stroke who presents with transient aphasia.  She has a history of a left cortical infarct, but given that she did not feel confused and has complete memory of the event earlier makes me think that this is less likely recrudescence and more likely possibly a recurrent TIA.  In any case, I would favor treating this as TIA.  I will also order an EEG, though my suspicion for seizure is also low.  RECOMMENDATIONS  - HgbA1c, fasting lipid panel - Frequent neuro checks - Echocardiogram - Prophylactic therapy-Antiplatelet med: Aspirin  - dose 81mg  and plavix  75mg  daily  after 300mg  load  - Risk factor modification - Telemetry monitoring - PT consult, OT consult, Speech consult -EEG - Stroke team to follow  ______________________________________________________________________    Signed, Aisha Seals, MD [facility]

## 2024-04-14 NOTE — H&P (Signed)
 History and Physical    Ariel Lambert FMW:[id number] DOB: 1936-01-16 DOA: 04/14/2024  I have briefly reviewed the patient's prior medical records in [facility]  PCP: Ariel Mems, Lambert  Patient coming from: home  Chief Complaint: aphasia  HPI: Ariel Lambert is a 88 y.o. female with medical history significant of HTN, COPD, prior CVA, comes in to the hospital with aphasia with onset this morning. Patient states that she was in her normal state of health prior to this happening.  No chest pain, no fever or chills, no shortness of breath.  Patient reported her aphasia has resolved and she is now back to baseline, however daughter who is at bedside tells me that she is still having difficulties getting the words out and is slower than normal.  There is no numbness or tingling, no weakness in her arms or legs.  At baseline, she ambulates with a walker due to some baseline balance issues and there are no recent changes.  She lives independently at home, drives and is able to do all her ADLs.  ED Course: In the ED her vitals are stable and blood work unremarkable.  CT scan was without evidence of acute intracranial abnormalities, it did show chronic left MCA infarct.  Neurology was consulted and we are asked to admit for TIA/CVA workup  Review of Systems: All systems reviewed, and apart from HPI, all negative  Past Medical History:  Diagnosis Date   Allergy    Arthritis    ASTHMA 05/03/2009   Asthma    Carotid stenosis    Chronic kidney disease     in remission   COPD (chronic obstructive pulmonary disease) (HCC)    CVA (cerebral infarction)    Dyslipidemia    Headache    Hearing aid worn    B/L   HYPERTENSION 04/30/2007   off of meds since  01/2023   HYPERTHYROIDISM 06/17/2007   Hyperthyroidism    Menopause    Multinodular goiter    OSTEOPOROSIS 04/30/2007   Pneumonia    Stroke [facility])    Surgical site infection 04/23/2023   Thyrotoxicosis 06/17/2007    Qualifier: Diagnosis of   By: Ariel Lambert, Ariel Lambert        Wears glasses     Past Surgical History:  Procedure Laterality Date   BACK SURGERY     BREAST EXCISIONAL BIOPSY Left    carotid artery surgery     CATARACT EXTRACTION W/ INTRAOCULAR LENS  IMPLANT, BILATERAL     ENDARTERECTOMY Right 09/29/2019   Procedure: REDO OF RIGHT ENDARTERECTOMY CAROTID;  Surgeon: Ariel Penne Bruckner, Lambert;  Location: [facility] OR;  Service: Vascular;  Laterality: Right;   INCISION AND DRAINAGE OF WOUND Right 04/25/2023   Procedure: IRRIGATION AND DEBRIDEMENT HIP WOUND; APPLICATION OF NEGATIVE PRESSURE WOUND VAC;  Surgeon: Ariel Rogue, Lambert;  Location: [facility];  Service: Orthopedics;  Laterality: Right;  220   NECK SURGERY     OTHER SURGICAL HISTORY     discectomy   TOTAL HIP ARTHROPLASTY Right 02/06/2023   Procedure: ANTERIOR TOTAL HIP ARTHROPLASTY;  Surgeon: Ariel Rogue, Lambert;  Location: [facility] OR;  Service: Orthopedics;  Laterality: Right;   TOTAL KNEE ARTHROPLASTY Left 09/30/2021   Procedure: TOTAL KNEE ARTHROPLASTY;  Surgeon: Ariel Lerner, Lambert;  Location: [facility];  Service: Orthopedics;  Laterality: Left;   WISDOM TOOTH EXTRACTION       reports that she has quit smoking. Her smoking use included cigarettes. She has never used smokeless tobacco.  She reports current alcohol  use. She reports that she does not use drugs.  Allergies  Allergen Reactions   Cetirizine Other (See Comments)    Stomach pain   Codeine Itching    Severe itching   Hydrocodone  Itching   Meperidine Hcl Diarrhea and Nausea And Vomiting    (Demerol)   Nabumetone Rash    (Relafen)   Naproxen Itching and Rash   Sulfa Antibiotics Rash   Sulfonamide Derivatives Rash    Family History  Problem Relation Age of Onset   Heart disease Other    Breast cancer Mother 73    Prior to Admission medications   Medication Sig Start Date End Date Taking? Authorizing Provider  alendronate  (FOSAMAX ) 70 MG tablet Take 70 mg by mouth once a week.  Take with a full glass of water  on an empty stomach.   Yes Provider, Historical, Lambert  aspirin  EC 81 MG tablet Take 81 mg by mouth daily. Swallow whole.   Yes Provider, Historical, Lambert  azelastine  (ASTELIN ) 0.1 % nasal spray Place 2 sprays into both nostrils every evening. Use in each nostril as directed   Yes Provider, Historical, Lambert  Biotin 5000 MCG CAPS Take 5,000 mcg by mouth daily.   Yes Provider, Historical, Lambert  Calcium  Carbonate (CALCIUM  500 PO) Take 500 mg by mouth 2 (two) times daily. *am and pm*   Yes Provider, Historical, Lambert  docusate sodium  (COLACE) 100 MG capsule Take 100 mg by mouth as needed for mild constipation.   Yes Provider, Historical, Lambert  ezetimibe -simvastatin  (VYTORIN ) 10-20 MG tablet Take 1 tablet by mouth daily after breakfast. 08/10/23 04/14/24 Yes Ariel Locus, DO  fexofenadine (ALLEGRA) 180 MG tablet Take 180 mg by mouth as needed for allergies or rhinitis.   Yes Provider, Historical, Lambert  fluticasone  (FLONASE ) 50 MCG/ACT nasal spray Place 2 sprays into both nostrils 2 (two) times daily. Am and pm   Yes Provider, Historical, Lambert  levalbuterol  (XOPENEX ) 0.63 MG/3ML nebulizer solution Take 3 mLs (0.63 mg total) by nebulization every 6 (six) hours as needed for up to 120 doses for wheezing or shortness of breath. 08/10/23  Yes Ariel Locus, DO  MAGNESIUM  GLUCONATE PO Take 500 mg by mouth daily.   Yes Provider, Historical, Lambert  methimazole  (TAPAZOLE ) 5 MG tablet Alternate 5 mg one day and 10 mg the next day 02/23/24  Yes Motwani, Komal, Lambert  montelukast  (SINGULAIR ) 10 MG tablet Take 1 tablet (10 mg total) by mouth at bedtime. 08/10/23 04/14/24 Yes Ariel Locus, DO  Multiple Vitamin (MULTIVITAMIN WITH MINERALS) TABS tablet Take 1 tablet by mouth daily.   Yes Provider, Historical, Lambert  SYMBICORT  160-4.5 MCG/ACT inhaler Inhale 2 puffs into the lungs 2 (two) times daily. 08/10/23 04/14/24 Yes Ariel Locus, DO  zinc  gluconate 50 MG tablet Take 150 mg by mouth daily.   Yes Provider, Historical, Lambert   acetaminophen  (TYLENOL ) 325 MG tablet Take 2 tablets (650 mg total) by mouth every 6 (six) hours as needed for fever, headache or mild pain. 02/11/23   Sherrill Cable Latif, DO  albuterol  (VENTOLIN  HFA) 108 (90 Base) MCG/ACT inhaler Inhale 2 puffs into the lungs every 6 (six) hours as needed for wheezing or shortness of breath (Asthma). 08/10/23 11/08/23  Ariel Locus, DO  cephALEXin  (KEFLEX ) 500 MG capsule Take 1 capsule (500 mg total) by mouth 3 (three) times daily. Patient not taking: Reported on 04/14/2024 09/18/23   Luiz Channel, Lambert  diphenhydrAMINE  (BENADRYL ) 25 MG tablet Take 25 mg by mouth daily  as needed for allergies.    Provider, Historical, Lambert  doxycycline  (VIBRA -TABS) 100 MG tablet Take 1 tablet (100 mg total) by mouth 2 (two) times daily. Patient not taking: Reported on 04/14/2024 09/18/23   Luiz Channel, Lambert  irbesartan  (AVAPRO ) 75 MG tablet Take 1 tablet (75 mg total) by mouth daily. 08/10/23 11/08/23  Ariel Locus, DO  mirtazapine  (REMERON  SOL-TAB) 30 MG disintegrating tablet Take 1 tablet (30 mg total) by mouth at bedtime. 08/10/23 11/08/23  Ariel Locus, DO    Physical Exam: Vitals:   04/14/24 1430 04/14/24 1530 04/14/24 1555 04/14/24 1645  BP:   (!) 151/93 (!) 166/78  Pulse: 68 69 65 67  Resp: (!) 22 (!) 22 16 18   Temp:   97.9 F (36.6 C) (!) 97.5 F (36.4 C)  TempSrc:   Oral   SpO2: 95% 94% 94% 100%      Constitutional: NAD, calm, comfortable Eyes: PERRL, lids and conjunctivae normal ENMT: Mucous membranes are moist. P Neck: normal, supple Respiratory: clear to auscultation bilaterally, no wheezing, no crackles. Normal respiratory effort. No accessory muscle use.  Cardiovascular: Regular rate and rhythm, no murmurs / rubs / gallops. No extremity edema. 2+ pedal pulses.  Abdomen: no tenderness, no masses palpated. Bowel sounds positive.  Musculoskeletal: no clubbing / cyanosis. Normal muscle tone.  Skin: no rashes, lesions, ulcers. No induration Neurologic: CN 2-12  grossly intact. Strength 5/5 in all 4.   Labs on Admission: I have personally reviewed following labs and imaging studies  CBC: Recent Labs  Lab 04/14/24 1415  WBC 9.4  NEUTROABS 8.1*  HGB 12.4  HCT 36.8  MCV 95.8  PLT 220   Basic Metabolic Panel: Recent Labs  Lab 04/14/24 1415  NA 138  K 3.9  CL 101  CO2 25  GLUCOSE 109*  BUN 17  CREATININE 0.52  CALCIUM  9.7   Liver Function Tests: Recent Labs  Lab 04/14/24 1415  AST 10*  ALT 9  ALKPHOS 87  BILITOT 0.6  PROT 7.0  ALBUMIN  4.1   Coagulation Profile: No results for input(s): INR, PROTIME in the last 168 hours. BNP (last 3 results) No results for input(s): PROBNP in the last 8760 hours. CBG: No results for input(s): GLUCAP in the last 168 hours. Thyroid  Function Tests: No results for input(s): TSH, T4TOTAL, FREET4, T3FREE, THYROIDAB in the last 72 hours. Urine analysis:    Component Value Date/Time   COLORURINE YELLOW 04/14/2024 1454   APPEARANCEUR CLEAR 04/14/2024 1454   LABSPEC 1.009 04/14/2024 1454   PHURINE 5.5 04/14/2024 1454   GLUCOSEU NEGATIVE 04/14/2024 1454   HGBUR SMALL (A) 04/14/2024 1454   BILIRUBINUR NEGATIVE 04/14/2024 1454   KETONESUR NEGATIVE 04/14/2024 1454   PROTEINUR NEGATIVE 04/14/2024 1454   UROBILINOGEN 0.2 03/22/2008 0735   NITRITE NEGATIVE 04/14/2024 1454   LEUKOCYTESUR MODERATE (A) 04/14/2024 1454     Radiological Exams on Admission: CT Head Wo Contrast Result Date: 04/14/2024 CLINICAL DATA:  Neuro deficit, acute, stroke suspected.  Confusion. EXAM: CT HEAD WITHOUT CONTRAST TECHNIQUE: Contiguous axial images were obtained from the base of the skull through the vertex without intravenous contrast. RADIATION DOSE REDUCTION: This exam was performed according to the departmental dose-optimization program which includes automated exposure control, adjustment of the mA and/or kV according to patient size and/or use of iterative reconstruction technique. COMPARISON:   Head CT 10/31/2021 FINDINGS: Brain: There is no evidence of an acute infarct, intracranial hemorrhage, mass, midline shift, or extra-axial fluid collection. A moderately large chronic left MCA  infarct is again noted. Hypodensities elsewhere in the cerebral white matter bilaterally are unchanged and nonspecific but compatible with mild chronic small vessel ischemic disease. Mild global cerebral atrophy is not advanced for age. Vascular: Calcified atherosclerosis at the skull base. No hyperdense vessel. Skull: No fracture or suspicious lesion. Sinuses/Orbits: Visualized paranasal sinuses and mastoid air cells are clear. Bilateral cataract extraction. Other: None. IMPRESSION: 1. No evidence of acute intracranial abnormality. 2. Chronic left MCA infarct. Electronically Signed   By: Dasie Hamburg M.D.   On: 04/14/2024 15:38   DG Chest Portable 1 View Result Date: 04/14/2024 CLINICAL DATA:  Altered mental status. EXAM: PORTABLE CHEST 1 VIEW COMPARISON:  August 04, 2023. FINDINGS: The heart size and mediastinal contours are within normal limits. Minimal bibasilar subsegmental atelectasis or scarring is noted. The visualized skeletal structures are unremarkable. IMPRESSION: Minimal bibasilar subsegmental atelectasis or scarring. Electronically Signed   By: Lynwood Landy Raddle M.D.   On: 04/14/2024 14:40    EKG: Independently reviewed.  Sinus rhythm  Assessment/Plan Principal problem TIA/CVA -patient presented to the hospital with aphasia, slowing getting the words out.  While she is feels like her symptoms resolved, I still appreciate a slowing of her speech and daughter appreciates that as well.  Admitted on TIA/CVA pathway, obtain MRI, carotids, 2D echo, lipid panel, A1c.  Neurology consulted  Active problems Essential hypertension-allow permissive hypertension  COPD-no wheezing, respiratory status at baseline, continue home medications  Hyperlipidemia-continue Vytorin   Hyperthyroidism-continue  methimazole   DVT prophylaxis: Lovenox   Code Status: DNR  Family Communication: Daughter at bedside Disposition Plan: Likely home when ready Bed Type: Telemetry Consults called: Neurology Obs/Inp: Observation   Nilda Fendt, MD, PhD [facility]  Contact via [email or web address]  04/14/2024, 5:45 PM

## 2024-04-14 NOTE — ED Notes (Signed)
 Called [facility] for transport

## 2024-04-14 NOTE — Plan of Care (Signed)

## 2024-04-14 NOTE — ED Notes (Signed)
 Attempted to call report, no answer at this time.

## 2024-04-14 NOTE — Progress Notes (Signed)
 Patient off the unit, transported in bed. Went to MRI.

## 2024-04-15 ENCOUNTER — Observation Stay (HOSPITAL_COMMUNITY)

## 2024-04-15 ENCOUNTER — Encounter: Payer: Self-pay | Admitting: Internal Medicine

## 2024-04-15 ENCOUNTER — Other Ambulatory Visit: Payer: Self-pay | Admitting: Cardiology

## 2024-04-15 ENCOUNTER — Other Ambulatory Visit (HOSPITAL_COMMUNITY): Payer: Self-pay

## 2024-04-15 DIAGNOSIS — G459 Transient cerebral ischemic attack, unspecified: Secondary | ICD-10-CM | POA: Diagnosis not present

## 2024-04-15 DIAGNOSIS — G40209 Localization-related (focal) (partial) symptomatic epilepsy and epileptic syndromes with complex partial seizures, not intractable, without status epilepticus: Secondary | ICD-10-CM | POA: Diagnosis not present

## 2024-04-15 DIAGNOSIS — I639 Cerebral infarction, unspecified: Secondary | ICD-10-CM | POA: Diagnosis not present

## 2024-04-15 LAB — HEMOGLOBIN A1C
Hgb A1c MFr Bld: 5.5 % (ref 4.8–5.6)
Mean Plasma Glucose: 111 mg/dL

## 2024-04-15 LAB — ECHOCARDIOGRAM COMPLETE
AR max vel: 2.16 cm2
AV Area VTI: 2.47 cm2
AV Area mean vel: 2.18 cm2
AV Mean grad: 7 mmHg
AV Peak grad: 11.7 mmHg
Ao pk vel: 1.71 m/s
Area-P 1/2: 2.38 cm2
Height: 62 in
S' Lateral: 2.3 cm
Weight: 2049.4 [oz_av]

## 2024-04-15 LAB — LIPID PANEL
Cholesterol: 103 mg/dL (ref 0–200)
HDL: 44 mg/dL (ref >40)
LDL Cholesterol: 52 mg/dL (ref 0–99)
Total CHOL/HDL Ratio: 2.3 ratio
Triglycerides: 36 mg/dL (ref <150)
VLDL: 7 mg/dL (ref 0–40)

## 2024-04-15 MED ORDER — CLOPIDOGREL BISULFATE 75 MG PO TABS
75.0000 mg | ORAL_TABLET | Freq: Every day | ORAL | 0 refills | Status: AC
  Filled 2024-04-15: qty 90, 90d supply, fill #0

## 2024-04-15 MED ORDER — EZETIMIBE 10 MG PO TABS
10.0000 mg | ORAL_TABLET | Freq: Every day | ORAL | Status: DC
  Administered 2024-04-15: 10 mg via ORAL
  Filled 2024-04-15: qty 1

## 2024-04-15 MED ORDER — ASPIRIN 81 MG PO TBEC
81.0000 mg | DELAYED_RELEASE_TABLET | Freq: Every day | ORAL | 0 refills | Status: AC
  Filled 2024-04-15: qty 21, 21d supply, fill #0

## 2024-04-15 MED ORDER — SIMVASTATIN 20 MG PO TABS: 20.0000 mg | ORAL_TABLET | Freq: Every day | ORAL | Status: DC

## 2024-04-15 NOTE — Progress Notes (Addendum)
 STROKE TEAM PROGRESS NOTE    INTERIM HISTORY/SUBJECTIVE She presented with a 60-minute episode of confusion, disorientation and expressive speech difficulties which resolved prior to presentation.  MRI scan negative for acute stroke.  MR angiogram shows no large vessel stenosis or occlusion.  LDL cholesterol 52 mg percent. Patient has remained hemodynamically stable and afebrile overnight.  MRI was negative for acute infarct.  EEG read is pending.  OBJECTIVE  CBC    Component Value Date/Time   WBC 9.4 04/14/2024 1415   RBC 3.84 (L) 04/14/2024 1415   HGB 12.4 04/14/2024 1415   HCT 36.8 04/14/2024 1415   PLT 220 04/14/2024 1415   MCV 95.8 04/14/2024 1415   MCH 32.3 04/14/2024 1415   MCHC 33.7 04/14/2024 1415   RDW 14.5 04/14/2024 1415   LYMPHSABS 0.6 (L) 04/14/2024 1415   MONOABS 0.5 04/14/2024 1415   EOSABS 0.1 04/14/2024 1415   BASOSABS 0.0 04/14/2024 1415    BMET    Component Value Date/Time   NA 138 04/14/2024 1415   K 3.9 04/14/2024 1415   CL 101 04/14/2024 1415   CO2 25 04/14/2024 1415   GLUCOSE 109 (H) 04/14/2024 1415   BUN 17 04/14/2024 1415   CREATININE 0.52 04/14/2024 1415   CREATININE 0.56 (L) 10/12/2023 1125   CALCIUM  9.7 04/14/2024 1415   GFRNONAA >60 04/14/2024 1415    IMAGING past 24 hours VAS US  CAROTID (at [facility] and [facility] only) Result Date: 04/15/2024 Carotid Arterial Duplex Study Patient Name:  CRISTOL ENGDAHL  Date of Exam:   04/15/2024 Medical Rec #: [id number]                Accession #:    [id number] Date of Birth: [birth year]                Patient Gender: F Patient Age:   88 years Exam Location:  [facility] Procedure:      VAS US  CAROTID Referring Phys: NILDA FENDT --------------------------------------------------------------------------------  Indications:   TIA and altered mental status. Risk Factors:  Hypertension, prior CVA. Other Factors: 09/29/2019 Rt CEA w/ repair of psuedo                05/2000 and 06/2000 carotid CEAs. Performing  Technologist: Elmarie Lindau, RVT  Examination Guidelines: A complete evaluation includes B-mode imaging, spectral Doppler, color Doppler, and power Doppler as needed of all accessible portions of each vessel. Bilateral testing is considered an integral part of a complete examination. Limited examinations for reoccurring indications may be performed as noted.  Right Carotid Findings: +----------+--------+--------+--------+-----------------------+--------+           PSV cm/sEDV cm/sStenosisPlaque Description     Comments +----------+--------+--------+--------+-----------------------+--------+ CCA Prox  86      19                                              +----------+--------+--------+--------+-----------------------+--------+ CCA Distal61      15              smooth and heterogenous         +----------+--------+--------+--------+-----------------------+--------+ ICA Prox  39      13                                              +----------+--------+--------+--------+-----------------------+--------+  ICA Mid   80      31                                              +----------+--------+--------+--------+-----------------------+--------+ ICA Distal93      37                                              +----------+--------+--------+--------+-----------------------+--------+ ECA       40                                                      +----------+--------+--------+--------+-----------------------+--------+ +----------+--------+-------+----------------+-------------------+           PSV cm/sEDV cmsDescribe        Arm Pressure (mmHG) +----------+--------+-------+----------------+-------------------+ [id number]             Multiphasic, WNL                    +----------+--------+-------+----------------+-------------------+ +---------+--------+--+--------+-+---------+ VertebralPSV cm/s35EDV cm/s9Antegrade  +---------+--------+--+--------+-+---------+  Left Carotid Findings: +----------+--------+--------+--------+--------------------------+--------+           PSV cm/sEDV cm/sStenosisPlaque Description        Comments +----------+--------+--------+--------+--------------------------+--------+ CCA Prox  88      24                                                 +----------+--------+--------+--------+--------------------------+--------+ CCA Distal55      17              irregular and heterogenous         +----------+--------+--------+--------+--------------------------+--------+ ICA Prox  47      17                                                 +----------+--------+--------+--------+--------------------------+--------+ ICA Mid   73      26                                                 +----------+--------+--------+--------+--------------------------+--------+ ICA Distal87      20                                                 +----------+--------+--------+--------+--------------------------+--------+ ECA       56                                                         +----------+--------+--------+--------+--------------------------+--------+ +----------+--------+--------+----------------+-------------------+  PSV cm/sEDV cm/sDescribe        Arm Pressure (mmHG) +----------+--------+--------+----------------+-------------------+ Subclavian102             Multiphasic, WNL                    +----------+--------+--------+----------------+-------------------+ +---------+--------+--+--------+--+---------+ VertebralPSV cm/s71EDV cm/s21Antegrade +---------+--------+--+--------+--+---------+   Summary: Right Carotid: Velocities in the right ICA are consistent with a 1-39% stenosis.                Patent CEA. Left Carotid: Velocities in the left ICA are consistent with a 1-39% stenosis.               Patent CEA. Vertebrals:  Bilateral vertebral  arteries demonstrate antegrade flow. Subclavians: Normal flow hemodynamics were seen in bilateral subclavian              arteries. *See table(s) above for measurements and observations.     Preliminary    ECHOCARDIOGRAM COMPLETE Result Date: 04/15/2024    ECHOCARDIOGRAM REPORT   Patient Name:   SURIE SUCHOCKI Date of Exam: 04/15/2024 Medical Rec #:  [id number]               Height:       62.0 in Accession #:    [id number]              Weight:       128.1 lb Date of Birth:  07-06-1936               BSA:          1.582 m Patient Age:    88 years                BP:           167/83 mmHg Patient Gender: F                       HR:           67 bpm. Exam Location:  Inpatient Procedure: 2D Echo, Cardiac Doppler and Color Doppler (Both Spectral and Color            Flow Doppler were utilized during procedure). Indications:    Stroke I63.9  History:        Patient has prior history of Echocardiogram examinations, most                 recent 10/08/2020. Stroke and COPD; Risk Factors:Hypertension.  Sonographer:    Jayson Gaskins Referring Phys: [phone] NILDA HERO GHERGHE IMPRESSIONS  1. Left ventricular ejection fraction, by estimation, is 60 to 65%. The left ventricle has normal function. The left ventricle has no regional wall motion abnormalities. There is mild asymmetric left ventricular hypertrophy of the basal-septal segment. Left ventricular diastolic parameters were normal.  2. Right ventricular systolic function is normal. The right ventricular size is normal. There is normal pulmonary artery systolic pressure. The estimated right ventricular systolic pressure is 23.1 mmHg.  3. The mitral valve is degenerative. Trivial mitral valve regurgitation. No evidence of mitral stenosis.  4. The aortic valve was not well visualized. Aortic valve regurgitation is not visualized. No aortic stenosis is present.  5. The inferior vena cava is normal in size with greater than 50% respiratory variability, suggesting right atrial  pressure of 3 mmHg. FINDINGS  Left Ventricle: Left ventricular ejection fraction, by estimation, is 60 to 65%. The left ventricle has normal function. The left ventricle has no regional wall  motion abnormalities. The left ventricular internal cavity size was normal in size. There is  mild asymmetric left ventricular hypertrophy of the basal-septal segment. Left ventricular diastolic parameters were normal. Right Ventricle: The right ventricular size is normal. No increase in right ventricular wall thickness. Right ventricular systolic function is normal. There is normal pulmonary artery systolic pressure. The tricuspid regurgitant velocity is 2.24 m/s, and  with an assumed right atrial pressure of 3 mmHg, the estimated right ventricular systolic pressure is 23.1 mmHg. Left Atrium: Left atrial size was normal in size. Right Atrium: Right atrial size was normal in size. Pericardium: There is no evidence of pericardial effusion. Mitral Valve: The mitral valve is degenerative in appearance. Trivial mitral valve regurgitation. No evidence of mitral valve stenosis. Tricuspid Valve: The tricuspid valve is normal in structure. Tricuspid valve regurgitation is trivial. Aortic Valve: The aortic valve was not well visualized. Aortic valve regurgitation is not visualized. No aortic stenosis is present. Aortic valve mean gradient measures 7.0 mmHg. Aortic valve peak gradient measures 11.7 mmHg. Aortic valve area, by VTI measures 2.47 cm. Pulmonic Valve: The pulmonic valve was not well visualized. Pulmonic valve regurgitation is not visualized. Aorta: The aortic root is normal in size and structure. Venous: The inferior vena cava is normal in size with greater than 50% respiratory variability, suggesting right atrial pressure of 3 mmHg. IAS/Shunts: The interatrial septum was not well visualized.  LEFT VENTRICLE PLAX 2D LVIDd:         3.50 cm   Diastology LVIDs:         2.30 cm   LV e' medial:    6.74 cm/s LV PW:         0.80 cm    LV E/e' medial:  13.2 LV IVS:        1.20 cm   LV e' lateral:   12.30 cm/s LVOT diam:     1.90 cm   LV E/e' lateral: 7.2 LV SV:         84 LV SV Index:   53 LVOT Area:     2.84 cm  RIGHT VENTRICLE RV S prime:     15.10 cm/s TAPSE (M-mode): 2.3 cm LEFT ATRIUM             Index        RIGHT ATRIUM          Index LA Vol (A2C):   44.2 ml 27.94 ml/m  RA Area:     9.99 cm LA Vol (A4C):   44.4 ml 28.07 ml/m  RA Volume:   20.10 ml 12.71 ml/m LA Biplane Vol: 42.7 ml 26.99 ml/m  AORTIC VALVE AV Area (Vmax):    2.16 cm AV Area (Vmean):   2.18 cm AV Area (VTI):     2.47 cm AV Vmax:           171.00 cm/s AV Vmean:          122.000 cm/s AV VTI:            0.339 m AV Peak Grad:      11.7 mmHg AV Mean Grad:      7.0 mmHg LVOT Vmax:         130.00 cm/s LVOT Vmean:        93.600 cm/s LVOT VTI:          0.295 m LVOT/AV VTI ratio: 0.87  AORTA Ao Root diam: 2.60 cm MITRAL VALVE  TRICUSPID VALVE MV Area (PHT): 2.38 cm     TR Peak grad:   20.1 mmHg MV Decel Time: 319 msec     TR Vmax:        224.00 cm/s MV E velocity: 89.10 cm/s MV A velocity: 101.00 cm/s  SHUNTS MV E/A ratio:  0.88         Systemic VTI:  0.30 m                             Systemic Diam: 1.90 cm Lonni Nanas MD Electronically signed by Lonni Nanas MD Signature Date/Time: 04/15/2024/11:01:30 AM    Final    MR ANGIO NECK W WO CONTRAST Result Date: 04/14/2024 CLINICAL DATA:  Stroke, follow up EXAM: MRA NECK WITHOUT AND WITH CONTRAST TECHNIQUE: Multiplanar and multiecho pulse sequences of the neck were obtained without and with intravenous contrast. Angiographic images of the neck were obtained using MRA technique without and with intravenous contrast. CONTRAST:  5mL GADAVIST  GADOBUTROL  1 MMOL/ML IV SOLN COMPARISON:  None Available. FINDINGS: Carotids: The common carotid and internal carotid arteries are patent without significant (greater than 50%) stenosis. Vertebral arteries: Poor visualization proximally. No visible  hemodynamically significant stenosis. The vertebral arteries are patent bilaterally and mildly left dominant. IMPRESSION: No hemodynamically significant stenosis. Electronically Signed   By: Gilmore GORMAN Molt M.D.   On: 04/14/2024 23:31   MR ANGIO HEAD WO CONTRAST Result Date: 04/14/2024 CLINICAL DATA:  Stroke, follow up EXAM: MRA HEAD WITHOUT CONTRAST TECHNIQUE: Angiographic images of the Circle of Willis were acquired using MRA technique without intravenous contrast. COMPARISON:  MRA Head 03/22/2008. FINDINGS: Anterior circulation: Bilateral intracranial ICAs, MCAs, and ACAs are patent without proximal hemodynamically significant stenosis. No aneurysm identified. Posterior circulation: Bilateral intradural vertebral arteries, basilar artery and bilateral posterior cerebral arteries are patent without proximal hemodynamically significant stenosis. IMPRESSION: No large vessel occlusion or proximal hemodynamically significant stenosis. Electronically Signed   By: Gilmore GORMAN Molt M.D.   On: 04/14/2024 23:13   MR BRAIN WO CONTRAST Result Date: 04/14/2024 CLINICAL DATA:  Neuro deficit, acute, stroke suspected EXAM: MRI HEAD WITHOUT CONTRAST TECHNIQUE: Multiplanar, multiecho pulse sequences of the brain and surrounding structures were obtained without intravenous contrast. COMPARISON:  CT head from earlier today. FINDINGS: Brain: No acute infarction, hemorrhage, hydrocephalus, extra-axial collection or mass lesion. Remote left MCA territory infarct. Small right cerebellar infarct. Vascular: Normal flow voids. Skull and upper cervical spine: Normal marrow signal. Sinuses/Orbits: Clear sinuses.  No acute orbital findings. Other: No mastoid effusions. IMPRESSION: 1. No evidence of acute intracranial abnormality. 2. Remote left MCA territory infarct. Electronically Signed   By: Gilmore GORMAN Molt M.D.   On: 04/14/2024 23:07   CT Head Wo Contrast Result Date: 04/14/2024 CLINICAL DATA:  Neuro deficit, acute, stroke  suspected.  Confusion. EXAM: CT HEAD WITHOUT CONTRAST TECHNIQUE: Contiguous axial images were obtained from the base of the skull through the vertex without intravenous contrast. RADIATION DOSE REDUCTION: This exam was performed according to the departmental dose-optimization program which includes automated exposure control, adjustment of the mA and/or kV according to patient size and/or use of iterative reconstruction technique. COMPARISON:  Head CT 10/31/2021 FINDINGS: Brain: There is no evidence of an acute infarct, intracranial hemorrhage, mass, midline shift, or extra-axial fluid collection. A moderately large chronic left MCA infarct is again noted. Hypodensities elsewhere in the cerebral white matter bilaterally are unchanged and nonspecific but compatible with mild chronic small vessel ischemic disease. Mild global  cerebral atrophy is not advanced for age. Vascular: Calcified atherosclerosis at the skull base. No hyperdense vessel. Skull: No fracture or suspicious lesion. Sinuses/Orbits: Visualized paranasal sinuses and mastoid air cells are clear. Bilateral cataract extraction. Other: None. IMPRESSION: 1. No evidence of acute intracranial abnormality. 2. Chronic left MCA infarct. Electronically Signed   By: Dasie Hamburg M.D.   On: 04/14/2024 15:38   DG Chest Portable 1 View Result Date: 04/14/2024 CLINICAL DATA:  Altered mental status. EXAM: PORTABLE CHEST 1 VIEW COMPARISON:  August 04, 2023. FINDINGS: The heart size and mediastinal contours are within normal limits. Minimal bibasilar subsegmental atelectasis or scarring is noted. The visualized skeletal structures are unremarkable. IMPRESSION: Minimal bibasilar subsegmental atelectasis or scarring. Electronically Signed   By: Lynwood Landy Raddle M.D.   On: 04/14/2024 14:40    Vitals:   04/14/24 1949 04/15/24 0025 04/15/24 0457 04/15/24 0800  BP:  131/80 (!) 167/83 121/75  Pulse:  62 65 66  Resp:  18 17 16   Temp:  98.6 F (37 C) 98.1 F (36.7 C)  97.7 F (36.5 C)  TempSrc:    Oral  SpO2:  96% 96% 95%  Weight: 58.1 kg     Height: 5' 2 (1.575 m)        PHYSICAL EXAM General:  Alert, well-nourished, well-developed pleasant elderly Caucasian lady in no acute distress Psych:  Mood and affect appropriate for situation CV: Regular rate and rhythm on monitor Respiratory:  Regular, unlabored respirations on room air   NEURO:  Mental Status: AA&Ox3, patient is able to give clear and coherent history Speech/Language: speech is without dysarthria or aphasia.    Cranial Nerves:  II: PERRL. Visual fields full.  III, IV, VI: EOMI. Eyelids elevate symmetrically.  V: Sensation is intact to light touch and symmetrical to face.  VII: Face is symmetrical resting and smiling VIII: hearing intact to voice. IX, X:  Phonation is normal.  XII: tongue is midline without fasciculations. Motor: Able to move all 4 extremities with good antigravity strength Tone: is normal and bulk is normal Sensation- Intact to light touch bilaterally.  Coordination: FTN intact bilaterally Gait- deferred  Most Recent NIH 0  ASSESSMENT/PLAN  [name] is a 88 y.o. female with history of previous stroke, hypertension and CKD admitted for an episode of aphasia and confusion lasting about 1 hour.  MRI was negative for acute stroke.  Patient reports that she has had at least 1 transient episode of confusion in the past but has no history of seizures and no obvious seizure risk factors.  EEG read is pending.  NIH on Admission 1  Left-sided TIA versus complex partial seizure   CT head No acute abnormality.  Chronic left MCA infarct MRI no acute abnormality MRA no LVO or hemodynamically significant stenosis in the head or neck Carotid Doppler pending 2D Echo EF 60 to 65%, mild asymmetric LVH with basal septal segment, trivial mitral valve regurgitation, normal left atrial size, interatrial septum not well-visualized EEG pending LDL 52 HgbA1c  pending VTE prophylaxis -Lovenox  aspirin  81 mg daily prior to admission, now on aspirin  81 mg daily and clopidogrel  75 mg daily for 3 weeks and then Plavix  alone. Therapy recommendations:  Pending Disposition: Pending, likely home  Hx of Stroke/TIA Patient has a history of left MCA stroke   Hypertension Home meds: None Stable Maintain normotension  Hyperlipidemia Home meds: Ezetimibe -simvastatin  10-20 1 tablet daily, resumed in hospital LDL 52, goal < 70 High intensity statin not indicated  as LDL below goal Continue statin at discharge  Risk for diabetes type II  Home meds: None HgbA1c pending., goal < 7.0  Other Stroke Risk Factors Advanced age   Other Active Problems None  Hospital day # 0  Patient seen by NP with MD, MD to edit note as needed. Cortney E Everitt Clint Kill , MSN, AGACNP-BC [facility] See Amion for schedule and pager information 04/15/2024 11:59 AM   I have personally obtained history,examined this patient, reviewed notes, independently viewed imaging studies, participated in medical decision making and plan of care.ROS completed by me personally and pertinent positives fully documented  I have made any additions or clarifications directly to the above note. Agree with note above.  Patient presented with transient episode of expressive language difficulties but accompanied by some confusion and disorientation possibly complex partial seizure versus left hemispheric TIA.  Recommend check EEG.  Aspirin  and Plavix  for 3 weeks followed by Plavix  alone and aggressive risk factor modification.  Outpatient follow-up with neurology.  Long discussion with patient and daughter at the bedside and answered questions.  Discussed with Dr.Gherghe.   I personally spent a total of 50 minutes in the care of the patient today including getting/reviewing separately obtained history, performing a medically appropriate exam/evaluation, counseling and educating, placing orders,  referring and communicating with other health care professionals, documenting clinical information in the EHR, independently interpreting results, and coordinating care.         Eather Popp, MD Medical Director [facility] Pager: [phone] 04/15/2024 2:28 PM  To contact Stroke Continuity provider, please refer to [email or web address]. After hours, contact General Neurology

## 2024-04-15 NOTE — Evaluation (Addendum)
 Physical Therapy Evaluation & Discharge Patient Details Name: Ariel Lambert MRN: [id number] DOB: [birth year] Today's Date: 04/15/2024  History of Present Illness  88 y.o. female admitted 04/14/24 with transient aphasia; LKW 04/13/24 prior to bed. MRI/MRA negative for acute injury. Workup for TIA. EEG ordered. PMH includes CKD, CVA, HTN, COPD, CKD, asthma, hearing aids, R hip hemiarthroplasty (2024) with ongoing R hip infections, L TKA (2023).   Clinical Impression  Patient evaluated by Physical Therapy with no further acute PT needs identified. PTA, pt mod indep with RW, drives, lives alone with intermittent assist from family for iADLs. Today, pt moving well, able to perform hallway ambulation with RW and self-care tasks without assist. Pt's daughter present and supportive. All education has been completed and the patient has no further questions. Acute PT is signing off. Thank you for this referral.    SpO2 94% on RA, HR 68     If plan is discharge home, recommend the following: Assistance with cooking/housework;Assist for transportation;Help with stairs or ramp for entrance   Can travel by private vehicle    Yes    Equipment Recommendations None recommended by PT  Recommendations for Other Services       Functional Status Assessment       Precautions / Restrictions Precautions Precautions: Fall Recall of Precautions/Restrictions: Intact Restrictions Weight Bearing Restrictions Per Provider Order: No      Mobility  Bed Mobility Overal bed mobility: Modified Independent                  Transfers Overall transfer level: Modified independent Equipment used: Rolling walker (2 wheels)               General transfer comment: mod indep sit<>stand from EOB and BSC (over toilet) to RW    Ambulation/Gait Ambulation/Gait assistance: Supervision Gait Distance (Feet): 180 Feet Assistive device: Rolling walker (2 wheels) Gait Pattern/deviations: Step-through  pattern, Decreased stride length, Trunk flexed Gait velocity: Decreased     General Gait Details: slow, steady gait with RW and supervision for safety/lines  Stairs            Wheelchair Mobility     Tilt Bed    Modified Rankin (Stroke Patients Only) Modified Rankin (Stroke Patients Only) Pre-Morbid Rankin Score: No symptoms Modified Rankin: No symptoms     Balance Overall balance assessment: Needs assistance Sitting-balance support: No upper extremity supported Sitting balance-Leahy Scale: Good Sitting balance - Comments: indep with toilet and adjusting socks   Standing balance support: No upper extremity supported Standing balance-Leahy Scale: Fair Standing balance comment: can stand at sink to wash hands and brush teeth, reaching outside BOS at times and stabilizing self against sink                             Pertinent Vitals/Pain Pain Assessment Pain Assessment: No/denies pain    Home Living Family/patient expects to be discharged to:: Private residence Living Arrangements: Children Available Help at Discharge: Family;Available PRN/intermittently;Available 24 hours/day Type of Home: House Home Access: Stairs to enter Entrance Stairs-Rails: Left Entrance Stairs-Number of Steps: 3   Home Layout: One level Home Equipment: Agricultural consultant (2 wheels);BSC/3in1;Shower seat;Cane - single point;Grab bars - tub/shower Additional Comments: daughter visiting from [place], her and other daughter to provide initial 24/7 assist first few days    Prior Function Prior Level of Function : Independent/Modified Independent;Driving  Mobility Comments: mod indep with RW; drives; goes to hair appt 1x/wk and enjoys church every Sunday ADLs Comments: mod indep ADLs; does some iADLs, including light cleaning and meal prep. values her independence, reluctant to consider wearing Life Alert or smart watch to detect falls     Extremity/Trunk Assessment    Upper Extremity Assessment Upper Extremity Assessment: Generalized weakness (near full range shoulder abd/flex, ER/IR, reports h/o R shoulder weakness)    Lower Extremity Assessment Lower Extremity Assessment: RLE deficits/detail RLE Deficits / Details: h/o R hip sxs and subsequent infections; hip flex 3/5, knee flex/ext 3-4/5; denies numbness/tingling    Cervical / Trunk Assessment Cervical / Trunk Assessment: Kyphotic  Communication   Communication Communication: Impaired Factors Affecting Communication: Hearing impaired (has hearing aids)    Cognition Arousal: Alert Behavior During Therapy: WFL for tasks assessed/performed   PT - Cognitive impairments: No apparent impairments                         Following commands: Intact       Cueing       General Comments General comments (skin integrity, edema, etc.): educ pt and daughter re: role of acute PT, stroke signs/symptoms (BE FAST), activity recommendations, fall risk reduction (pt has been reluctant to consider smart watch or life alert with family discussions), d/c needs and initial supervision from family    Exercises     Assessment/Plan    PT Assessment Patient does not need any further PT services  PT Problem List         PT Treatment Interventions      PT Goals (Current goals can be found in the Care Plan section)  Acute Rehab PT Goals PT Goal Formulation: All assessment and education complete, DC therapy    Frequency       Co-evaluation               AM-PAC PT 6 Clicks Mobility  Outcome Measure Help needed turning from your back to your side while in a flat bed without using bedrails?: None Help needed moving from lying on your back to sitting on the side of a flat bed without using bedrails?: None Help needed moving to and from a bed to a chair (including a wheelchair)?: None Help needed standing up from a chair using your arms (e.g., wheelchair or bedside chair)?: None Help  needed to walk in hospital room?: A Little Help needed climbing 3-5 steps with a railing? : A Little 6 Click Score: 22    End of Session   Activity Tolerance: Patient tolerated treatment well Patient left: in bed;with call bell/phone within reach;with bed alarm set;with family/visitor present Nurse Communication: Mobility status PT Visit Diagnosis: Other abnormalities of gait and mobility (R26.89)    Time: 9242-9177 PT Time Calculation (min) (ACUTE ONLY): 25 min   Charges:   PT Evaluation $PT Eval Low Complexity: 1 Low PT Treatments $Gait Training: 8-22 mins PT General Charges $$ ACUTE PT VISIT: 1 Visit       Darice Almas, PT, DPT Acute Rehabilitation Services  Personal: Secure Chat Rehab Office: [phone]  Darice LITTIE Almas 04/15/2024, 8:35 AM

## 2024-04-15 NOTE — Progress Notes (Signed)
 Carotid art exam is completed. Charo Philipp, RVT

## 2024-04-15 NOTE — Progress Notes (Signed)
 SLP Cancellation Note  Patient Details Name: Ariel Lambert MRN: [id number] DOB: 06-06-1936   Cancelled treatment:        Orders for speech language assessment received and appreciated.  Pt's MRI without acute findings.  Pt and daughter state that she has returned to baseline.  Pt participating in conversation without difficulty.  No slowness of speech or dysarthria observed.  Evaluation deferred at this time.  SLP will sign off.    Anette FORBES Grippe, MA, CCC-SLP Acute Rehabilitation Services Office: [phone] 04/15/2024, 10:30 AM

## 2024-04-15 NOTE — Procedures (Signed)
 Patient Name: Ariel Lambert  MRN: [id number]  Epilepsy Attending: Pastor Falling  Referring Physician/Provider: No ref. provider found      Date: 04/15/2024 Duration: 23 minutes   Patient history: 88 y.o. female with history of previous stroke, hypertension and CKD admitted for an episode of aphasia and confusion lasting about 1 hour.  MRI was negative for acute stroke.  Patient reports that she has had at least 1 transient episode of confusion in the past but has no history of seizures and no obvious seizure risk factors   Level of alertness: Awake, drowsy  AEDs during EEG study:   Technical aspects: This EEG study was done with scalp electrodes positioned according to the 10-20 International system of electrode placement. Electrical activity was reviewed with band pass filter of 1-70Hz , sensitivity of 7 uV/mm, display speed of 53mm/sec with a 60Hz  notched filter applied as appropriate. EEG data were recorded continuously and digitally stored.  Video monitoring was available and reviewed as appropriate.  Description: The posterior dominant rhythm consists of 9 Hz activity of moderate voltage (25-35 uV) seen predominantly in posterior head regions, symmetric and reactive to eye opening and eye closing. Drowsiness was characterized by attenuation of the posterior background rhythm. Sleep ws not seen. EEG showed left temporal focal slowing. Hyperventilation and photic stimulation were not performed.     ABNORMALITY - Continuous slow, left frontotemporal region   IMPRESSION: This study is suggestive of cortical dysfunction arising from left frontotemporal region, nonspecific etiology, likely secondary to underlying structural abnormality. No seizures or epileptiform discharges were seen throughout the recording.   Pastor Falling MD Neurology

## 2024-04-15 NOTE — Care Management Obs Status (Signed)
 MEDICARE OBSERVATION STATUS NOTIFICATION   Patient Details  Name: Ariel Lambert MRN: [id number] Date of Birth: 03-Jul-1936   Medicare Observation Status Notification Given:  Yes  Obs signed and copy given  Claretta Deed 04/15/2024, 3:17 PM

## 2024-04-15 NOTE — Progress Notes (Signed)
 Transition of Care [facility]) - Inpatient Brief Assessment   Patient Details  Name: Ariel Lambert MRN: [id number] Date of Birth: Jul 03, 1936  [facility] CM/SW Contact:    Rosaline JONELLE Joe, RN Phone Number: 04/15/2024, 3:22 PM   Clinical Narrative: Patient admitted from home with TIA.  No IP Care needs at this time.  Patient will be discharged home by bedside nursing.   Transition of Care Asessment: Insurance and Status: (P) Insurance coverage has been reviewed Patient has primary care physician: (P) Yes Home environment has been reviewed: (P) from home Prior level of function:: (P) Independent - self Prior/Current Home Services: (P) No current home services Social Drivers of Health Review: (P) SDOH reviewed no interventions necessary Readmission risk has been reviewed: (P) Yes Transition of care needs: (P) no transition of care needs at this time

## 2024-04-15 NOTE — Progress Notes (Signed)
 30 day monitor for possible TIA vs seizure. To be read by Dr. Acharya

## 2024-04-15 NOTE — Progress Notes (Signed)
 EEG headbox replaced due to connection issues

## 2024-04-15 NOTE — Progress Notes (Cosign Needed)
 EEG complete - results pending

## 2024-04-15 NOTE — Discharge Summary (Signed)
 Physician Discharge Summary  OKTOBER GLAZER FMW:[id number] DOB: Jan 24, 1936 DOA: 04/14/2024  PCP: Verena Mems, MD  Admit date: 04/14/2024 Discharge date: 04/15/2024  Admitted From: home Disposition:  home  Recommendations for Outpatient Follow-up:  Follow up with PCP in 1-2 weeks  Home Health: none Equipment/Devices: none  Discharge Condition: stable CODE STATUS: DNR Diet Orders (From admission, onward)     Start     Ordered   04/14/24 1818  Diet regular Room service appropriate? Yes; Fluid consistency: Thin  Diet effective now       Question Answer Comment  Room service appropriate? Yes   Fluid consistency: Thin      04/14/24 1817            HPI: Per admitting MD, Ariel Lambert is a 88 y.o. female with medical history significant of HTN, COPD, prior CVA, comes in to the hospital with aphasia with onset this morning. Patient states that she was in her normal state of health prior to this happening.  No chest pain, no fever or chills, no shortness of breath.  Patient reported her aphasia has resolved and she is now back to baseline, however daughter who is at bedside tells me that she is still having difficulties getting the words out and is slower than normal.  There is no numbness or tingling, no weakness in her arms or legs.  At baseline, she ambulates with a walker due to some baseline balance issues and there are no recent changes.  She lives independently at home, drives and is able to do all her ADLs.   Hospital Course / Discharge diagnoses: Principal problem TIA-patient presented to the hospital with aphasia, slowing getting the words out.  This lasted about an hour, then patient felt like her symptoms resolved.  Neurology consulted and she was admitted to the hospital, underwent regular TIA workup.  An MRI of the brain was negative for acute findings.  MR angiogram without acute large vessel occlusions, carotids fairly unremarkable.  2D echo was  done that showed normal LVEF.  Lipid panel showed an LDL of 52, she is already on lipid-lowering medications.  She worked with physical therapy and she was felt to be back to baseline, she will be placed on aspirin  and Plavix  for 21 days followed by Plavix  alone.  She will be discharged home in stable condition  Active problems Essential hypertension-resume home medications COPD-no wheezing, respiratory status at baseline, continue home medications Hyperlipidemia-continue Vytorin  Hyperthyroidism-continue methimazole   Sepsis ruled out   Discharge Instructions   Allergies as of 04/15/2024       Reactions   Cetirizine Other (See Comments)   Stomach pain   Codeine Itching   Severe itching   Hydrocodone  Itching   Meperidine Hcl Diarrhea, Nausea And Vomiting   (Demerol)   Nabumetone Rash   (Relafen)   Naproxen Itching, Rash   Sulfa Antibiotics Rash   Sulfonamide Derivatives Rash        Medication List     STOP taking these medications    cephALEXin  500 MG capsule Commonly known as: Keflex    doxycycline  100 MG tablet Commonly known as: VIBRA -TABS       TAKE these medications    acetaminophen  325 MG tablet Commonly known as: TYLENOL  Take 2 tablets (650 mg total) by mouth every 6 (six) hours as needed for fever, headache or mild pain.   albuterol  108 (90 Base) MCG/ACT inhaler Commonly known as: VENTOLIN  HFA Inhale 2 puffs into the lungs every  6 (six) hours as needed for wheezing or shortness of breath (Asthma).   alendronate  70 MG tablet Commonly known as: FOSAMAX  Take 70 mg by mouth once a week. Take with a full glass of water  on an empty stomach.   aspirin  EC 81 MG tablet Take 1 tablet (81 mg total) by mouth daily for 21 days. Swallow whole.   azelastine  0.1 % nasal spray Commonly known as: ASTELIN  Place 2 sprays into both nostrils every evening. Use in each nostril as directed   Biotin 5000 MCG Caps Take 5,000 mcg by mouth daily.   CALCIUM  500 PO Take  500 mg by mouth 2 (two) times daily. *am and pm*   clopidogrel  75 MG tablet Commonly known as: PLAVIX  Take 1 tablet (75 mg total) by mouth daily. Start taking on: April 16, 2024   diphenhydrAMINE  25 MG tablet Commonly known as: BENADRYL  Take 25 mg by mouth daily as needed for allergies.   docusate sodium  100 MG capsule Commonly known as: COLACE Take 100 mg by mouth as needed for mild constipation.   ezetimibe -simvastatin  10-20 MG tablet Commonly known as: VYTORIN  Take 1 tablet by mouth daily after breakfast.   fexofenadine 180 MG tablet Commonly known as: ALLEGRA Take 180 mg by mouth as needed for allergies or rhinitis.   fluticasone  50 MCG/ACT nasal spray Commonly known as: FLONASE  Place 2 sprays into both nostrils 2 (two) times daily. Am and pm   irbesartan  75 MG tablet Commonly known as: AVAPRO  Take 1 tablet (75 mg total) by mouth daily.   levalbuterol  0.63 MG/3ML nebulizer solution Commonly known as: XOPENEX  Take 3 mLs (0.63 mg total) by nebulization every 6 (six) hours as needed for up to 120 doses for wheezing or shortness of breath.   MAGNESIUM  GLUCONATE PO Take 500 mg by mouth daily.   methimazole  5 MG tablet Commonly known as: TAPAZOLE  Alternate 5 mg one day and 10 mg the next day   mirtazapine  30 MG disintegrating tablet Commonly known as: REMERON  SOL-TAB Take 1 tablet (30 mg total) by mouth at bedtime.   montelukast  10 MG tablet Commonly known as: SINGULAIR  Take 1 tablet (10 mg total) by mouth at bedtime.   multivitamin with minerals Tabs tablet Take 1 tablet by mouth daily.   Symbicort  160-4.5 MCG/ACT inhaler Generic drug: budesonide -formoterol  Inhale 2 puffs into the lungs 2 (two) times daily.   zinc  gluconate 50 MG tablet Take 150 mg by mouth daily.       Consultations: Neurology   Procedures/Studies:  VAS US  CAROTID (at [facility] and [facility] only) Result Date: 04/15/2024 Carotid Arterial Duplex Study Patient Name:  RECHELLE NIEBLA  Date  of Exam:   04/15/2024 Medical Rec #: [id number]                Accession #:    [id number] Date of Birth: [birth year]                Patient Gender: F Patient Age:   88 years Exam Location:  [facility] Procedure:      VAS US  CAROTID Referring Phys: NILDA FENDT --------------------------------------------------------------------------------  Indications:   TIA and altered mental status. Risk Factors:  Hypertension, prior CVA. Other Factors: 09/29/2019 Rt CEA w/ repair of psuedo                05/2000 and 06/2000 carotid CEAs. Performing Technologist: Elmarie Lindau, RVT  Examination Guidelines: A complete evaluation includes B-mode imaging, spectral Doppler, color Doppler, and power Doppler as  needed of all accessible portions of each vessel. Bilateral testing is considered an integral part of a complete examination. Limited examinations for reoccurring indications may be performed as noted.  Right Carotid Findings: +----------+--------+--------+--------+-----------------------+--------+           PSV cm/sEDV cm/sStenosisPlaque Description     Comments +----------+--------+--------+--------+-----------------------+--------+ CCA Prox  86      19                                              +----------+--------+--------+--------+-----------------------+--------+ CCA Distal61      15              smooth and heterogenous         +----------+--------+--------+--------+-----------------------+--------+ ICA Prox  39      13                                              +----------+--------+--------+--------+-----------------------+--------+ ICA Mid   80      31                                              +----------+--------+--------+--------+-----------------------+--------+ ICA Distal93      37                                              +----------+--------+--------+--------+-----------------------+--------+ ECA       40                                                       +----------+--------+--------+--------+-----------------------+--------+ +----------+--------+-------+----------------+-------------------+           PSV cm/sEDV cmsDescribe        Arm Pressure (mmHG) +----------+--------+-------+----------------+-------------------+ [id number]             Multiphasic, WNL                    +----------+--------+-------+----------------+-------------------+ +---------+--------+--+--------+-+---------+ VertebralPSV cm/s35EDV cm/s9Antegrade +---------+--------+--+--------+-+---------+  Left Carotid Findings: +----------+--------+--------+--------+--------------------------+--------+           PSV cm/sEDV cm/sStenosisPlaque Description        Comments +----------+--------+--------+--------+--------------------------+--------+ CCA Prox  88      24                                                 +----------+--------+--------+--------+--------------------------+--------+ CCA Distal55      17              irregular and heterogenous         +----------+--------+--------+--------+--------------------------+--------+ ICA Prox  47      17                                                 +----------+--------+--------+--------+--------------------------+--------+  ICA Mid   73      26                                                 +----------+--------+--------+--------+--------------------------+--------+ ICA Distal87      20                                                 +----------+--------+--------+--------+--------------------------+--------+ ECA       56                                                         +----------+--------+--------+--------+--------------------------+--------+ +----------+--------+--------+----------------+-------------------+           PSV cm/sEDV cm/sDescribe        Arm Pressure (mmHG) +----------+--------+--------+----------------+-------------------+ Subclavian102              Multiphasic, WNL                    +----------+--------+--------+----------------+-------------------+ +---------+--------+--+--------+--+---------+ VertebralPSV cm/s71EDV cm/s21Antegrade +---------+--------+--+--------+--+---------+   Summary: Right Carotid: Velocities in the right ICA are consistent with a 1-39% stenosis.                Patent CEA. Left Carotid: Velocities in the left ICA are consistent with a 1-39% stenosis.               Patent CEA. Vertebrals:  Bilateral vertebral arteries demonstrate antegrade flow. Subclavians: Normal flow hemodynamics were seen in bilateral subclavian              arteries. *See table(s) above for measurements and observations.     Preliminary    ECHOCARDIOGRAM COMPLETE Result Date: 04/15/2024    ECHOCARDIOGRAM REPORT   Patient Name:   ARDINE IACOVELLI Date of Exam: 04/15/2024 Medical Rec #:  [id number]               Height:       62.0 in Accession #:    [id number]              Weight:       128.1 lb Date of Birth:  Dec 28, 1935               BSA:          1.582 m Patient Age:    88 years                BP:           167/83 mmHg Patient Gender: F                       HR:           67 bpm. Exam Location:  Inpatient Procedure: 2D Echo, Cardiac Doppler and Color Doppler (Both Spectral and Color            Flow Doppler were utilized during procedure). Indications:    Stroke I63.9  History:        Patient has prior history of Echocardiogram examinations, most  recent 10/08/2020. Stroke and COPD; Risk Factors:Hypertension.  Sonographer:    Jayson Gaskins Referring Phys: [id number] NILDA HERO [employer] IMPRESSIONS  1. Left ventricular ejection fraction, by estimation, is 60 to 65%. The left ventricle has normal function. The left ventricle has no regional wall motion abnormalities. There is mild asymmetric left ventricular hypertrophy of the basal-septal segment. Left ventricular diastolic parameters were normal.  2. Right ventricular systolic function is normal.  The right ventricular size is normal. There is normal pulmonary artery systolic pressure. The estimated right ventricular systolic pressure is 23.1 mmHg.  3. The mitral valve is degenerative. Trivial mitral valve regurgitation. No evidence of mitral stenosis.  4. The aortic valve was not well visualized. Aortic valve regurgitation is not visualized. No aortic stenosis is present.  5. The inferior vena cava is normal in size with greater than 50% respiratory variability, suggesting right atrial pressure of 3 mmHg. FINDINGS  Left Ventricle: Left ventricular ejection fraction, by estimation, is 60 to 65%. The left ventricle has normal function. The left ventricle has no regional wall motion abnormalities. The left ventricular internal cavity size was normal in size. There is  mild asymmetric left ventricular hypertrophy of the basal-septal segment. Left ventricular diastolic parameters were normal. Right Ventricle: The right ventricular size is normal. No increase in right ventricular wall thickness. Right ventricular systolic function is normal. There is normal pulmonary artery systolic pressure. The tricuspid regurgitant velocity is 2.24 m/s, and  with an assumed right atrial pressure of 3 mmHg, the estimated right ventricular systolic pressure is 23.1 mmHg. Left Atrium: Left atrial size was normal in size. Right Atrium: Right atrial size was normal in size. Pericardium: There is no evidence of pericardial effusion. Mitral Valve: The mitral valve is degenerative in appearance. Trivial mitral valve regurgitation. No evidence of mitral valve stenosis. Tricuspid Valve: The tricuspid valve is normal in structure. Tricuspid valve regurgitation is trivial. Aortic Valve: The aortic valve was not well visualized. Aortic valve regurgitation is not visualized. No aortic stenosis is present. Aortic valve mean gradient measures 7.0 mmHg. Aortic valve peak gradient measures 11.7 mmHg. Aortic valve area, by VTI measures 2.47 cm.  Pulmonic Valve: The pulmonic valve was not well visualized. Pulmonic valve regurgitation is not visualized. Aorta: The aortic root is normal in size and structure. Venous: The inferior vena cava is normal in size with greater than 50% respiratory variability, suggesting right atrial pressure of 3 mmHg. IAS/Shunts: The interatrial septum was not well visualized.  LEFT VENTRICLE PLAX 2D LVIDd:         3.50 cm   Diastology LVIDs:         2.30 cm   LV e' medial:    6.74 cm/s LV PW:         0.80 cm   LV E/e' medial:  13.2 LV IVS:        1.20 cm   LV e' lateral:   12.30 cm/s LVOT diam:     1.90 cm   LV E/e' lateral: 7.2 LV SV:         84 LV SV Index:   53 LVOT Area:     2.84 cm  RIGHT VENTRICLE RV S prime:     15.10 cm/s TAPSE (M-mode): 2.3 cm LEFT ATRIUM             Index        RIGHT ATRIUM          Index LA Vol (A2C):   44.2 ml 27.94  ml/m  RA Area:     9.99 cm LA Vol (A4C):   44.4 ml 28.07 ml/m  RA Volume:   20.10 ml 12.71 ml/m LA Biplane Vol: 42.7 ml 26.99 ml/m  AORTIC VALVE AV Area (Vmax):    2.16 cm AV Area (Vmean):   2.18 cm AV Area (VTI):     2.47 cm AV Vmax:           171.00 cm/s AV Vmean:          122.000 cm/s AV VTI:            0.339 m AV Peak Grad:      11.7 mmHg AV Mean Grad:      7.0 mmHg LVOT Vmax:         130.00 cm/s LVOT Vmean:        93.600 cm/s LVOT VTI:          0.295 m LVOT/AV VTI ratio: 0.87  AORTA Ao Root diam: 2.60 cm MITRAL VALVE                TRICUSPID VALVE MV Area (PHT): 2.38 cm     TR Peak grad:   20.1 mmHg MV Decel Time: 319 msec     TR Vmax:        224.00 cm/s MV E velocity: 89.10 cm/s MV A velocity: 101.00 cm/s  SHUNTS MV E/A ratio:  0.88         Systemic VTI:  0.30 m                             Systemic Diam: 1.90 cm Lonni Nanas MD Electronically signed by Lonni Nanas MD Signature Date/Time: 04/15/2024/11:01:30 AM    Final    MR ANGIO NECK W WO CONTRAST Result Date: 04/14/2024 CLINICAL DATA:  Stroke, follow up EXAM: MRA NECK WITHOUT AND WITH CONTRAST  TECHNIQUE: Multiplanar and multiecho pulse sequences of the neck were obtained without and with intravenous contrast. Angiographic images of the neck were obtained using MRA technique without and with intravenous contrast. CONTRAST:  5mL GADAVIST  GADOBUTROL  1 MMOL/ML IV SOLN COMPARISON:  None Available. FINDINGS: Carotids: The common carotid and internal carotid arteries are patent without significant (greater than 50%) stenosis. Vertebral arteries: Poor visualization proximally. No visible hemodynamically significant stenosis. The vertebral arteries are patent bilaterally and mildly left dominant. IMPRESSION: No hemodynamically significant stenosis. Electronically Signed   By: Gilmore GORMAN Molt M.D.   On: 04/14/2024 23:31   MR ANGIO HEAD WO CONTRAST Result Date: 04/14/2024 CLINICAL DATA:  Stroke, follow up EXAM: MRA HEAD WITHOUT CONTRAST TECHNIQUE: Angiographic images of the Circle of Willis were acquired using MRA technique without intravenous contrast. COMPARISON:  MRA Head 03/22/2008. FINDINGS: Anterior circulation: Bilateral intracranial ICAs, MCAs, and ACAs are patent without proximal hemodynamically significant stenosis. No aneurysm identified. Posterior circulation: Bilateral intradural vertebral arteries, basilar artery and bilateral posterior cerebral arteries are patent without proximal hemodynamically significant stenosis. IMPRESSION: No large vessel occlusion or proximal hemodynamically significant stenosis. Electronically Signed   By: Gilmore GORMAN Molt M.D.   On: 04/14/2024 23:13   MR BRAIN WO CONTRAST Result Date: 04/14/2024 CLINICAL DATA:  Neuro deficit, acute, stroke suspected EXAM: MRI HEAD WITHOUT CONTRAST TECHNIQUE: Multiplanar, multiecho pulse sequences of the brain and surrounding structures were obtained without intravenous contrast. COMPARISON:  CT head from earlier today. FINDINGS: Brain: No acute infarction, hemorrhage, hydrocephalus, extra-axial collection or mass lesion. Remote left  MCA territory infarct. Small  right cerebellar infarct. Vascular: Normal flow voids. Skull and upper cervical spine: Normal marrow signal. Sinuses/Orbits: Clear sinuses.  No acute orbital findings. Other: No mastoid effusions. IMPRESSION: 1. No evidence of acute intracranial abnormality. 2. Remote left MCA territory infarct. Electronically Signed   By: Gilmore GORMAN Molt M.D.   On: 04/14/2024 23:07   CT Head Wo Contrast Result Date: 04/14/2024 CLINICAL DATA:  Neuro deficit, acute, stroke suspected.  Confusion. EXAM: CT HEAD WITHOUT CONTRAST TECHNIQUE: Contiguous axial images were obtained from the base of the skull through the vertex without intravenous contrast. RADIATION DOSE REDUCTION: This exam was performed according to the departmental dose-optimization program which includes automated exposure control, adjustment of the mA and/or kV according to patient size and/or use of iterative reconstruction technique. COMPARISON:  Head CT 10/31/2021 FINDINGS: Brain: There is no evidence of an acute infarct, intracranial hemorrhage, mass, midline shift, or extra-axial fluid collection. A moderately large chronic left MCA infarct is again noted. Hypodensities elsewhere in the cerebral white matter bilaterally are unchanged and nonspecific but compatible with mild chronic small vessel ischemic disease. Mild global cerebral atrophy is not advanced for age. Vascular: Calcified atherosclerosis at the skull base. No hyperdense vessel. Skull: No fracture or suspicious lesion. Sinuses/Orbits: Visualized paranasal sinuses and mastoid air cells are clear. Bilateral cataract extraction. Other: None. IMPRESSION: 1. No evidence of acute intracranial abnormality. 2. Chronic left MCA infarct. Electronically Signed   By: Dasie Hamburg M.D.   On: 04/14/2024 15:38   DG Chest Portable 1 View Result Date: 04/14/2024 CLINICAL DATA:  Altered mental status. EXAM: PORTABLE CHEST 1 VIEW COMPARISON:  August 04, 2023. FINDINGS: The heart size  and mediastinal contours are within normal limits. Minimal bibasilar subsegmental atelectasis or scarring is noted. The visualized skeletal structures are unremarkable. IMPRESSION: Minimal bibasilar subsegmental atelectasis or scarring. Electronically Signed   By: Lynwood Landy Raddle M.D.   On: 04/14/2024 14:40     Subjective: - no chest pain, shortness of breath, no abdominal pain, nausea or vomiting.   Discharge Exam: BP (!) 143/79 (BP Location: Right Arm)   Pulse 67   Temp (!) 97.5 F (36.4 C)   Resp 16   Ht 5' 2 (1.575 m)   Wt 58.1 kg   SpO2 97%   BMI 23.43 kg/m   General: Pt is alert, awake, not in acute distress Cardiovascular: RRR, S1/S2 +, no rubs, no gallops Respiratory: CTA bilaterally, no wheezing, no rhonchi Abdominal: Soft, NT, ND, bowel sounds + Extremities: no edema, no cyanosis    The results of significant diagnostics from this hospitalization (including imaging, microbiology, ancillary and laboratory) are listed below for reference.     Microbiology: No results found for this or any previous visit (from the past 240 hours).   Labs: Basic Metabolic Panel: Recent Labs  Lab 04/14/24 1415  NA 138  K 3.9  CL 101  CO2 25  GLUCOSE 109*  BUN 17  CREATININE 0.52  CALCIUM  9.7   Liver Function Tests: Recent Labs  Lab 04/14/24 1415  AST 10*  ALT 9  ALKPHOS 87  BILITOT 0.6  PROT 7.0  ALBUMIN  4.1   CBC: Recent Labs  Lab 04/14/24 1415  WBC 9.4  NEUTROABS 8.1*  HGB 12.4  HCT 36.8  MCV 95.8  PLT 220   CBG: No results for input(s): GLUCAP in the last 168 hours. Hgb A1c No results for input(s): HGBA1C in the last 72 hours. Lipid Profile Recent Labs    04/15/24 0252  CHOL  103  HDL 44  LDLCALC 52  TRIG 36  CHOLHDL 2.3   Thyroid  function studies No results for input(s): TSH, T4TOTAL, T3FREE, THYROIDAB in the last 72 hours.  Invalid input(s): FREET3 Urinalysis    Component Value Date/Time   COLORURINE YELLOW 04/14/2024  1454   APPEARANCEUR CLEAR 04/14/2024 1454   LABSPEC 1.009 04/14/2024 1454   PHURINE 5.5 04/14/2024 1454   GLUCOSEU NEGATIVE 04/14/2024 1454   HGBUR SMALL (A) 04/14/2024 1454   BILIRUBINUR NEGATIVE 04/14/2024 1454   KETONESUR NEGATIVE 04/14/2024 1454   PROTEINUR NEGATIVE 04/14/2024 1454   UROBILINOGEN 0.2 03/22/2008 0735   NITRITE NEGATIVE 04/14/2024 1454   LEUKOCYTESUR MODERATE (A) 04/14/2024 1454    FURTHER DISCHARGE INSTRUCTIONS:   Get Medicines reviewed and adjusted: Please take all your medications with you for your next visit with your Primary MD   Laboratory/radiological data: Please request your Primary MD to go over all hospital tests and procedure/radiological results at the follow up, please ask your Primary MD to get all Hospital records sent to his/her office.   In some cases, they will be blood work, cultures and biopsy results pending at the time of your discharge. Please request that your primary care M.D. goes through all the records of your hospital data and follows up on these results.   Also Note the following: If you experience worsening of your admission symptoms, develop shortness of breath, life threatening emergency, suicidal or homicidal thoughts you must seek medical attention immediately by calling 911 or calling your MD immediately  if symptoms less severe.   You must read complete instructions/literature along with all the possible adverse reactions/side effects for all the Medicines you take and that have been prescribed to you. Take any new Medicines after you have completely understood and accpet all the possible adverse reactions/side effects.    Do not drive when taking Pain medications or sleeping medications (Benzodaizepines)   Do not take more than prescribed Pain, Sleep and Anxiety Medications. It is not advisable to combine anxiety,sleep and pain medications without talking with your primary care practitioner   Special Instructions: If you have  smoked or chewed Tobacco  in the last 2 yrs please stop smoking, stop any regular Alcohol   and or any Recreational drug use.   Wear Seat belts while driving.   Please note: You were cared for by a hospitalist during your hospital stay. Once you are discharged, your primary care physician will handle any further medical issues. Please note that NO REFILLS for any discharge medications will be authorized once you are discharged, as it is imperative that you return to your primary care physician (or establish a relationship with a primary care physician if you do not have one) for your post hospital discharge needs so that they can reassess your need for medications and monitor your lab values.  Time coordinating discharge: 35 minutes  SIGNED:  Nilda Fendt, MD, PhD 04/15/2024, 2:52 PM

## 2024-04-15 NOTE — Progress Notes (Signed)
 DISCHARGE NOTE HOME Ariel Lambert to be discharged Home per MD order. Discussed prescriptions and follow up appointments with the patient. Prescriptions given to patient; medication list explained in detail. Patient verbalized understanding.  Skin clean, dry and intact without evidence of skin break down, no evidence of skin tears noted. IV catheter discontinued intact. Site without signs and symptoms of complications. Dressing and pressure applied. Pt denies pain at the site currently. No complaints noted.  Patient free of lines, drains, and wounds.   An After Visit Summary (AVS) was printed and given to the patient. Patient escorted via wheelchair, and discharged home via private auto.  Peyton SHAUNNA Pepper, RN

## 2024-04-15 NOTE — Progress Notes (Signed)
 OT Screen Note  Patient Details Name: Ariel Lambert MRN: [id number] DOB: April 03, 1936   Cancelled Treatment:    Reason Eval/Treat Not Completed: OT screened, no needs identified, will sign off (Discussed with providing PT, pt completing ADLs ind and back to functional baseline. Has no acute OT needs, signing off)  04/15/2024  AB, OTR/L  Acute Rehabilitation Services  Office: [phone]   Ariel Lambert 04/15/2024, 8:37 AM

## 2024-04-26 ENCOUNTER — Telehealth: Payer: Self-pay | Admitting: Internal Medicine

## 2024-04-26 NOTE — Telephone Encounter (Signed)
 Pt is out at a restaurant at this moment. Please call back around 2:00 pm today. Thank you.

## 2024-04-26 NOTE — Telephone Encounter (Signed)
 Pt requesting a c/b regarding Zio Monitor. Please advise

## 2024-04-26 NOTE — Telephone Encounter (Signed)
 Spoke with pt regarding 30 day event monitor. Pt stated she does not think it is necessary to wear the heart monitor as she had many tests in the hospital. Pt was told that the heart monitor would help to determine the best way to go about her care going forward. Pt again stated she does not feel the heart monitor is necessary. Pt was told the ordering provider and Dr. Loni would be notified. Pt verbalized understanding and all questions if any were answered.

## 2024-04-27 DIAGNOSIS — I10 Essential (primary) hypertension: Secondary | ICD-10-CM | POA: Diagnosis not present

## 2024-04-27 DIAGNOSIS — Z8673 Personal history of transient ischemic attack (TIA), and cerebral infarction without residual deficits: Secondary | ICD-10-CM | POA: Diagnosis not present

## 2024-04-28 DIAGNOSIS — R350 Frequency of micturition: Secondary | ICD-10-CM | POA: Diagnosis not present

## 2024-05-04 DIAGNOSIS — Z8781 Personal history of (healed) traumatic fracture: Secondary | ICD-10-CM | POA: Diagnosis not present

## 2024-05-04 DIAGNOSIS — E0590 Thyrotoxicosis, unspecified without thyrotoxic crisis or storm: Secondary | ICD-10-CM | POA: Diagnosis not present

## 2024-05-04 DIAGNOSIS — Z8673 Personal history of transient ischemic attack (TIA), and cerebral infarction without residual deficits: Secondary | ICD-10-CM | POA: Diagnosis not present

## 2024-05-04 DIAGNOSIS — M810 Age-related osteoporosis without current pathological fracture: Secondary | ICD-10-CM | POA: Diagnosis not present

## 2024-05-09 DIAGNOSIS — M4646 Discitis, unspecified, lumbar region: Secondary | ICD-10-CM | POA: Diagnosis not present

## 2024-06-11 NOTE — Progress Notes (Deleted)
 Cardiology Office Note:    Date:  06/11/2024   ID:  Ariel Lambert, DOB 1936/06/03, MRN [id number]  PCP:  Ariel Mems, MD  Cardiologist:  Ariel DELENA Merck, MD { Click to update primary MD,subspecialty MD or APP then REFRESH:1}    Referring MD: Ariel Mems, MD   Chief Complaint: hospital follow-up TIA  History of Present Illness:    Ariel Lambert is a 88 y.o. female with a history of recurrent CVA, carotid artery disease s/p bilateral CEA in 2001 and redo right CEA with repair of patch pseudoaneurysm in 09/2019, asthma, hypertension, hyperlipidemia, and hyperthyroidism who is followed by Dr. Merck and presents today for hospital follow-up of TIA.  Patient has a history of CVA in 2001. She was found to have significant carotid artery disease at that time and underwent bilateral CEA with patch angioplasty. She was referred to Ariel Lambert in 09/2019 for pre-op evaluation for repair of probable pseudoaneurysm of previous patch angioplasty on the right. She had no cardiac symptoms at that time and was felt to be at acceptable risk for procedure. She underwent surgery later that month with redo right CEA with repair of patch pseudoaneurysm with bovine pericardial patch. She reported balance issues since surgery.   Echo was ordered in 09/2020 given history of TIA and showed normal LV function with grade 1 diastolic dysfunction, mild Mr, and borderline dilatation of the ascending aorta measuring 40 mm.   She was last seen by Dr. Merck in 04/2021 at which time she was still having balance issues but this had improved some with PT. She was doing well from a cardiac standpoint.    She was admitted in 04/2024 for a TIA after presenting with asphasia. Head CT and brain MRI showed chronic left MCA infarct but no acute findings. MRA of head/ neck showed no large vessel occlusion or proximal hemodynamically significant stenosis. Echo showed LVEF of 60-65% with mild asymmetric LVH  of the basal septal segment, normal RV size and function, and trivial MR. Carotid dopplers showed 1-39% stenosis of bilateral ICAs with patent CEAs. She was placed on DAPT with Aspirin  and Plavix  with plans to continue this for 3 weeks and then transition to Plavix  monotherapy. Outpatient monitor was ordered and showed ***  Patient presents today for follow-up. ***  Recurrent CVA Patient has a history of recurrent CVA. She had a remote CVA in 2001 and was found to have significant carotid artery disease. Underwent bilateral CEA at that time. She was recently admitted for TIA in 04/2024. Work-up including head CT, brain MRI, head/ neck MRA, Echo, and carotid dopplers were unremarkable. Outpatient monitor showed *** - Plan was for DAPT for 3 weeks and then Plavix  monotherapy.  - Continue Ezetimibe -Simvastatin  10-20mg  daily.   Carotid Artery Disease S/p bilateral CEA in 2001 and then redo right CEA with repair of patch pseudoaneurysm with bovine pericardial patch in 09/2019. Last carotid dopplers in 04/2024 during admission for TIA showed 1-39% stenosis of bilateral ICAs with patent CEAs. - Continue antiplatelet therapy and lipid therapy as above.  Hypertension BP *** - Continue Irbesartan  75mg  daily.  Hyperlipidemia Lipid panel in 04/2024: Total Cholesterol 103, Triglycerides 36, HDL 44, LDL 52. LDL goal <55.  - Continue Ezetimibe -Simvastatin  10-20mg  daily.   EKGs/Labs/Other Studies Reviewed:    The following studies were reviewed:  Echocardiogram 04/15/2024: Impressions: 1. Left ventricular ejection fraction, by estimation, is 60 to 65%. The  left ventricle has normal function. The left ventricle has no regional  wall motion abnormalities. There is mild asymmetric left ventricular  hypertrophy of the basal-septal segment.  Left ventricular diastolic parameters were normal.   2. Right ventricular systolic function is normal. The right ventricular  size is normal. There is normal pulmonary  artery systolic pressure. The  estimated right ventricular systolic pressure is 23.1 mmHg.   3. The mitral valve is degenerative. Trivial mitral valve regurgitation.  No evidence of mitral stenosis.   4. The aortic valve was not well visualized. Aortic valve regurgitation  is not visualized. No aortic stenosis is present.   5. The inferior vena cava is normal in size with greater than 50%  respiratory variability, suggesting right atrial pressure of 3 mmHg.  _______________  Carotid Ultrasounds 04/15/2024: Summary:  - Right Carotid: Velocities in the right ICA are consistent with a 1-39% stenosis. Patent CEA.  - Left Carotid: Velocities in the left ICA are consistent with a 1-39% stenosis. Patent CEA.  - Vertebrals:  Bilateral vertebral arteries demonstrate antegrade flow.  - Subclavians: Normal flow hemodynamics were seen in bilateral subclavian arteries.  _______________  Event Monitor ***  EKG:  EKG not ordered today.   Recent Labs: 08/10/2023: Magnesium  2.1 02/16/2024: TSH 1.62 04/14/2024: ALT 9; BUN 17; Creatinine, Ser 0.52; Hemoglobin 12.4; Platelets 220; Potassium 3.9; Sodium 138  Recent Lipid Panel    Component Value Date/Time   CHOL 103 04/15/2024 0252   TRIG 36 04/15/2024 0252   HDL 44 04/15/2024 0252   CHOLHDL 2.3 04/15/2024 0252   VLDL 7 04/15/2024 0252   LDLCALC 52 04/15/2024 0252    Physical Exam:    Vital Signs: There were no vitals taken for this visit.    Wt Readings from Last 3 Encounters:  04/14/24 128 lb 1.4 oz (58.1 kg)  02/23/24 128 lb (58.1 kg)  11/26/23 123 lb (55.8 kg)     General: 88 y.o. female in no acute distress. HEENT: Normocephalic and atraumatic. Sclera clear.  Neck: Supple. No carotid bruits. No JVD. Heart: *** RRR. Distinct S1 and S2. No murmurs, gallops, or rubs.  Lungs: No increased work of breathing. Clear to ausculation bilaterally. No wheezes, rhonchi, or rales.  Abdomen: Soft, non-distended, and non-tender to palpation.   Extremities: No lower extremity edema.  Radial and distal pedal pulses 2+ and equal bilaterally. Skin: Warm and dry. Neuro: No focal deficits. Psych: Normal affect. Responds appropriately.   Assessment:    No diagnosis found.  Plan:     Disposition: Follow up in ***   Signed, Ariel Lambert  06/11/2024 5:39 PM    [facility]

## 2024-06-21 ENCOUNTER — Ambulatory Visit: Admitting: Student

## 2024-07-14 NOTE — Progress Notes (Signed)
 Cardiology Office Note:    Date:  07/21/2024   ID:  Ariel Lambert, DOB [birth year], MRN [id number]  PCP:  Verena Mems, MD  Cardiologist:  Soyla DELENA Merck, MD     Referring MD: Verena Mems, MD   Chief Complaint: hospital follow-up TIA  History of Present Illness:    Ariel Lambert is a 88 y.o. female with a history of recurrent CVA, carotid artery disease s/p bilateral CEA in 2001 and redo right CEA with repair of patch pseudoaneurysm in 09/2019, asthma, hypertension, hyperlipidemia, and hyperthyroidism who is followed by Dr. Merck and presents today for hospital follow-up of TIA.  Patient has a history of CVA in 2001. She was found to have significant carotid artery disease at that time and underwent bilateral CEA with patch angioplasty. She was referred to Dr. Acharya in 09/2019 for pre-op evaluation for repair of probable pseudoaneurysm of previous patch angioplasty on the right. She had no cardiac symptoms at that time and was felt to be at acceptable risk for procedure. She underwent surgery later that month with redo right CEA with repair of patch pseudoaneurysm with bovine pericardial patch. She reported balance issues since surgery.   Echo was ordered in 09/2020 given history of TIA and showed normal LV function with grade 1 diastolic dysfunction, mild Mr, and borderline dilatation of the ascending aorta measuring 40 mm.   She was last seen by Dr. Merck in 04/2021 at which time she was still having balance issues but this had improved some with PT. She was doing well from a cardiac standpoint.    She was admitted in 04/2024 for a TIA after presenting with asphasia. Head CT and brain MRI showed chronic left MCA infarct but no acute findings. MRA of head/ neck showed no large vessel occlusion or proximal hemodynamically significant stenosis. Echo showed LVEF of 60-65% with mild asymmetric LVH of the basal septal segment, normal RV size and function, and trivial  MR. Carotid dopplers showed 1-39% stenosis of bilateral ICAs with patent CEAs. She was placed on DAPT with Aspirin  and Plavix  with plans to continue this for 3 weeks and then transition to Plavix  monotherapy. Outpatient monitor was ordered.  Patient presents today for follow-up. She is here with her grandson. She states she never wore the Event Monitor because it showed up at her house and she was not sure what it was for so she mailed it back. She has done well since discharge. She reports occasional swelling in her right leg she had a hip replacement but states this is stable. No significant edema noted on exam. She denies any other cardiac symptoms.   ROS: No chest pain, shortness of breath, orthopnea, PND, palpitations, lightheadedness, dizziness, syncope.   EKGs/Labs/Other Studies Reviewed:    The following studies were reviewed:  Echocardiogram 04/15/2024: Impressions: 1. Left ventricular ejection fraction, by estimation, is 60 to 65%. The  left ventricle has normal function. The left ventricle has no regional  wall motion abnormalities. There is mild asymmetric left ventricular  hypertrophy of the basal-septal segment.  Left ventricular diastolic parameters were normal.   2. Right ventricular systolic function is normal. The right ventricular  size is normal. There is normal pulmonary artery systolic pressure. The  estimated right ventricular systolic pressure is 23.1 mmHg.   3. The mitral valve is degenerative. Trivial mitral valve regurgitation.  No evidence of mitral stenosis.   4. The aortic valve was not well visualized. Aortic valve regurgitation  is not visualized.  No aortic stenosis is present.   5. The inferior vena cava is normal in size with greater than 50%  respiratory variability, suggesting right atrial pressure of 3 mmHg.  _______________  Carotid Ultrasounds 04/15/2024: Summary:  - Right Carotid: Velocities in the right ICA are consistent with a 1-39% stenosis.  Patent CEA.  - Left Carotid: Velocities in the left ICA are consistent with a 1-39% stenosis. Patent CEA.  - Vertebrals:  Bilateral vertebral arteries demonstrate antegrade flow.  - Subclavians: Normal flow hemodynamics were seen in bilateral subclavian arteries.  _______________   EKG:  EKG not ordered today.   Recent Labs: 08/10/2023: Magnesium  2.1 02/16/2024: TSH 1.62 04/14/2024: ALT 9; BUN 17; Creatinine, Ser 0.52; Hemoglobin 12.4; Platelets 220; Potassium 3.9; Sodium 138  Recent Lipid Panel    Component Value Date/Time   CHOL 103 04/15/2024 0252   TRIG 36 04/15/2024 0252   HDL 44 04/15/2024 0252   CHOLHDL 2.3 04/15/2024 0252   VLDL 7 04/15/2024 0252   LDLCALC 52 04/15/2024 0252    Physical Exam:    Vital Signs: BP 128/78   Pulse 75   Ht 5' 2 (1.575 m)   Wt 130 lb (59 kg)   SpO2 96%   BMI 23.78 kg/m     Wt Readings from Last 3 Encounters:  07/21/24 130 lb (59 kg)  04/14/24 128 lb 1.4 oz (58.1 kg)  02/23/24 128 lb (58.1 kg)     General: 88 y.o. Caucasian female in no acute distress. HEENT: Normocephalic and atraumatic. Sclera clear.  Neck: Supple. No carotid bruits. No JVD. Heart: RRR. Possible very soft murmur.  Lungs: No increased work of breathing. Clear to ausculation bilaterally. No wheezes, rhonchi, or rales.  Extremities: No lower extremity edema.  Skin: Warm and dry. Neuro: No focal deficits. Psych: Normal affect. Responds appropriately.   Assessment:    1. Recent TIA   2. Recurrent cerebrovascular accidents (CVAs) (HCC)   3. Bilateral carotid artery disease, unspecified type   4. Primary hypertension   5. Hyperlipidemia, unspecified hyperlipidemia type     Plan:    Recurrent CVA Recent TIA Patient has a history of recurrent CVA. She had a remote CVA in 2001 and was found to have significant carotid artery disease. Underwent bilateral CEA at that time. She was recently admitted for TIA in 04/2024. Work-up including head CT, brain MRI, head/ neck  MRA, Echo, and carotid dopplers were unremarkable. Outpatient monitor was ordered but she never wore this.  - Plan was for DAPT for 3 weeks and then Plavix  monotherapy.  - Continue Ezetimibe -Simvastatin  10-20mg  daily.  - Discussed the reasoning for the monitor. She has already mailed the Event Monitor back. She thought this monitor was too bulky but is agreeable to wearing a Zio monitor. Will place a 2 week live Zio monitor in office. I initially was planned to do two back-to-back 2 week live monitors so that we would get a total of 4 weeks but unfortunately we were unable to do this.   Carotid Artery Disease S/p bilateral CEA in 2001 and then redo right CEA with repair of patch pseudoaneurysm with bovine pericardial patch in 09/2019. Last carotid dopplers in 04/2024 during admission for TIA showed 1-39% stenosis of bilateral ICAs with patent CEAs. - Continue antiplatelet therapy and lipid therapy as above.  Hypertension BP well controlled.  - Continue Irbesartan  75mg  daily.  Hyperlipidemia Lipid panel in 04/2024: Total Cholesterol 103, Triglycerides 36, HDL 44, LDL 52. LDL goal <55.  -  Continue Ezetimibe -Simvastatin  10-20mg  daily.   Disposition: Follow up in 1 year.    Signed, Aline FORBES Door, PA-C  07/21/2024 3:28 PM    [facility]

## 2024-07-21 ENCOUNTER — Ambulatory Visit: Attending: Student | Admitting: Student

## 2024-07-21 ENCOUNTER — Encounter: Payer: Self-pay | Admitting: Student

## 2024-07-21 ENCOUNTER — Ambulatory Visit (INDEPENDENT_AMBULATORY_CARE_PROVIDER_SITE_OTHER)

## 2024-07-21 VITALS — BP 128/78 | HR 75 | Ht 62.0 in | Wt 130.0 lb

## 2024-07-21 DIAGNOSIS — G459 Transient cerebral ischemic attack, unspecified: Secondary | ICD-10-CM

## 2024-07-21 DIAGNOSIS — E785 Hyperlipidemia, unspecified: Secondary | ICD-10-CM

## 2024-07-21 DIAGNOSIS — I10 Essential (primary) hypertension: Secondary | ICD-10-CM | POA: Diagnosis not present

## 2024-07-21 DIAGNOSIS — I779 Disorder of arteries and arterioles, unspecified: Secondary | ICD-10-CM | POA: Diagnosis not present

## 2024-07-21 DIAGNOSIS — I639 Cerebral infarction, unspecified: Secondary | ICD-10-CM

## 2024-07-21 NOTE — Patient Instructions (Signed)
 Medication Instructions:  Your physician recommends that you continue on your current medications as directed. Please refer to the Current Medication list given to you today. *If you need a refill on your cardiac medications before your next appointment, please call your pharmacy*  Lab Work: None ordered If you have labs (blood work) drawn today and your tests are completely normal, you will receive your results only by: MyChart Message (if you have MyChart) OR A paper copy in the mail If you have any lab test that is abnormal or we need to change your treatment, we will call you to review the results.  Testing/Procedures: ZIO AT Long term monitor-Live Telemetry  Your physician has requested you wear a ZIO patch monitor for 14 days.  This is a single patch monitor. [employer] supplies one patch monitor per enrollment. Additional  stickers are not available.  Please do not apply patch if you will be having a Nuclear Stress Test, Echocardiogram, Cardiac CT, MRI,  or Chest Xray during the period you would be wearing the monitor. The patch cannot be worn during  these tests. You cannot remove and re-apply the ZIO AT patch monitor.  Your ZIO patch monitor will be mailed 3 day USPS to your address on file. It may take 3-5 days to  receive your monitor after you have been enrolled.  Once you have received your monitor, please review the enclosed instructions. Your monitor has  already been registered assigning a specific monitor serial # to you.   Billing and Patient Assistance Program information  Ariel Lambert has been supplied with any insurance information on record for billing. [employer] offers a sliding scale Patient Assistance Program for patients without insurance, or whose  insurance does not completely cover the cost of the ZIO patch monitor. You must apply for the  Patient Assistance Program to qualify for the discounted rate. To apply, call [employer] at [phone],  select option 4, select  option 2 , ask to apply for the Patient Assistance Program, (you can request an  interpreter if needed). [employer] will ask your household income and how many people are in your  household. [employer] will quote your out-of-pocket cost based on this information. They will also be able  to set up a 12 month interest free payment plan if needed.  Applying the monitor   Shave hair from upper left chest.  Hold the abrader disc by orange tab. Rub the abrader in 40 strokes over left upper chest as indicated in  your monitor instructions.  Clean area with 4 enclosed alcohol  pads. Use all pads to ensure the area is cleaned thoroughly. Let  dry.  Apply patch as indicated in monitor instructions. Patch will be placed under collarbone on left side of  chest with arrow pointing upward.  Rub patch adhesive wings for 2 minutes. Remove the white label marked 1. Remove the white label  marked 2. Rub patch adhesive wings for 2 additional minutes.  While looking in a mirror, press and release button in center of patch. A small green light will flash 3-4  times. This will be your only indicator that the monitor has been turned on.  Do not shower for the first 24 hours. You may shower after the first 24 hours.  Press the button if you feel a symptom. You will hear a small click. Record Date, Time and Symptom in  the Patient Log.   Starting the Gateway  In your kit there is a audiological scientist box the size  of a cellphone. This is [employer]. It transmits all your  recorded data to [facility]. This box must always stay within 10 feet of you. Open the box and push the *  button. There will be a light that blinks orange and then green a few times. When the light stops  blinking, the Gateway is connected to the ZIO patch. Call [employer] at [phone] to confirm your monitor is transmitting.  Returning your monitor  Remove your patch and place it inside the Gateway. In the lower half of the Gateway there is a white   bag with prepaid postage on it. Place Gateway in bag and seal. Mail package back to [place] as soon as  possible. Your physician should have your final report approximately 7 days after you have mailed back  your monitor. Call [facility] Customer Care at [phone] if you have questions regarding your ZIO AT  patch monitor. Call them immediately if you see an orange light blinking on your monitor.  If your monitor falls off in less than 4 days, contact our Monitor department at [phone]. If your  monitor becomes loose or falls off after 4 days call [employer] at [phone] for suggestions on  securing your monitor   Follow-Up: At [facility], you and your health needs are our priority.  As part of our continuing mission to provide you with exceptional heart care, our providers are all part of one team.  This team includes your primary Cardiologist (physician) and Advanced Practice Providers or APPs (Physician Assistants and Nurse Practitioners) who all work together to provide you with the care you need, when you need it.  Your next appointment:   12 month(s)  Provider:   Gayatri A Acharya, MD    We recommend signing up for the patient portal called MyChart.  Sign up information is provided on this After Visit Summary.  MyChart is used to connect with patients for Virtual Visits (Telemedicine).  Patients are able to view lab/test results, encounter notes, upcoming appointments, etc.  Non-urgent messages can be sent to your provider as well.   To learn more about what you can do with MyChart, go to [email or web address].   Other Instructions

## 2024-07-21 NOTE — Progress Notes (Unsigned)
 ZIO AT serial # [id number] applied to patient using tincture of benzoin.  Dr. Loni to read.

## 2024-08-10 NOTE — Progress Notes (Signed)
 Pt presents for updated audio and HAC of O[employer]on More 1 RITE hearing aids (fit here in April 2022). She is also here for EMIs for custom RIE molds. Pt has normal to severe SNHL AU with h/o poor WRS AU. She has partner mic but has not used it much and it is currently at home in [place]. Pt will be moving here in the next few months. AA recently cleaned/serviced HAs (receiver was broken on one HA and other HA was plugged with wax). Exp level was also increased to level 3 2 days ago. Pt reports HAs working better since recent HAC/adjustment.   RESULTS: Decrease in WRS AD, otherwise stable compared to 2023 results. AUDIO Normal sloping to severe SNHL AU Poor WRS AU (48% RE, 64% LE) TYMPS Type A AU  RX: 1. Connected HAs to NOAH and changed env configuration setting for simple to difficult range. Also increased NNS for diff env to max of 10dB and to 2dB for easy env. AA found stock partner mic for pt to use while she is here for a few weeks and connected it to her HAs. Counseled pt and daughter on how to properly use mic. 2. EMIs taken without incident. Will order RIE molds and pt will be contacted for fitting once they arrive in office. Pt will see Dr. Geni for EMF (n/c).  3. HACs every 6 mos or sooner PRN.

## 2024-08-18 DIAGNOSIS — G459 Transient cerebral ischemic attack, unspecified: Secondary | ICD-10-CM | POA: Diagnosis not present

## 2024-08-19 ENCOUNTER — Ambulatory Visit: Payer: Self-pay | Admitting: Student

## 2024-08-22 NOTE — Progress Notes (Signed)
 Left vm for cb regarding heart monitor results

## 2024-08-23 MED ORDER — METOPROLOL SUCCINATE ER 25 MG PO TB24: 25.0000 mg | ORAL_TABLET | Freq: Every day | ORAL | 0 refills | Status: AC

## 2024-08-23 NOTE — Progress Notes (Signed)
 Spoke with patient regarding heart monitor. She did agree to taking the metoprolol  succ 25. Medication was sent to pt in [place]  to the [facility] at [address]. Patient understands to take one tablet daily.

## 2024-09-12 ENCOUNTER — Encounter: Payer: Self-pay | Admitting: Family Medicine

## 2024-09-12 ENCOUNTER — Other Ambulatory Visit: Payer: Self-pay | Admitting: Family Medicine

## 2024-09-12 DIAGNOSIS — Z1231 Encounter for screening mammogram for malignant neoplasm of breast: Secondary | ICD-10-CM

## 2024-09-20 ENCOUNTER — Ambulatory Visit
Admission: RE | Admit: 2024-09-20 | Discharge: 2024-09-20 | Disposition: A | Source: Ambulatory Visit | Attending: Family Medicine

## 2024-09-20 DIAGNOSIS — Z1231 Encounter for screening mammogram for malignant neoplasm of breast: Secondary | ICD-10-CM
